# Patient Record
Sex: Male | Born: 1945 | Race: Black or African American | Hispanic: No | Marital: Single | Smoking: Current every day smoker
Health system: Southern US, Community
[De-identification: ages and names within clinical notes are randomized; demographics above are authoritative.]

## PROBLEM LIST (undated history)

## (undated) DIAGNOSIS — I10 Essential (primary) hypertension: Secondary | ICD-10-CM

## (undated) DIAGNOSIS — G4730 Sleep apnea, unspecified: Secondary | ICD-10-CM

## (undated) DIAGNOSIS — I2510 Atherosclerotic heart disease of native coronary artery without angina pectoris: Secondary | ICD-10-CM

## (undated) DIAGNOSIS — J189 Pneumonia, unspecified organism: Secondary | ICD-10-CM

## (undated) DIAGNOSIS — E039 Hypothyroidism, unspecified: Secondary | ICD-10-CM

## (undated) DIAGNOSIS — Z923 Personal history of irradiation: Secondary | ICD-10-CM

## (undated) DIAGNOSIS — E119 Type 2 diabetes mellitus without complications: Secondary | ICD-10-CM

## (undated) DIAGNOSIS — I255 Ischemic cardiomyopathy: Secondary | ICD-10-CM

## (undated) DIAGNOSIS — I509 Heart failure, unspecified: Secondary | ICD-10-CM

## (undated) HISTORY — PX: CORONARY ARTERY BYPASS GRAFT: SHX141

## (undated) HISTORY — DX: Type 2 diabetes mellitus without complications: E11.9

## (undated) HISTORY — PX: CARDIAC SURGERY: SHX584

## (undated) HISTORY — PX: COLONOSCOPY: SHX174

---

## 2016-03-25 ENCOUNTER — Encounter (HOSPITAL_COMMUNITY): Payer: Self-pay | Admitting: Emergency Medicine

## 2016-03-25 ENCOUNTER — Emergency Department (HOSPITAL_COMMUNITY)
Admission: EM | Admit: 2016-03-25 | Discharge: 2016-03-25 | Disposition: A | Discharge status: Home / Self Care | Payer: Self-pay | Attending: Emergency Medicine | Admitting: Emergency Medicine

## 2016-03-25 ENCOUNTER — Emergency Department (HOSPITAL_COMMUNITY): Payer: Self-pay

## 2016-03-25 DIAGNOSIS — Z79899 Other long term (current) drug therapy: Secondary | ICD-10-CM | POA: Insufficient documentation

## 2016-03-25 DIAGNOSIS — M79652 Pain in left thigh: Secondary | ICD-10-CM | POA: Insufficient documentation

## 2016-03-25 DIAGNOSIS — F17200 Nicotine dependence, unspecified, uncomplicated: Secondary | ICD-10-CM | POA: Insufficient documentation

## 2016-03-25 DIAGNOSIS — Z7984 Long term (current) use of oral hypoglycemic drugs: Secondary | ICD-10-CM | POA: Insufficient documentation

## 2016-03-25 DIAGNOSIS — R1030 Lower abdominal pain, unspecified: Secondary | ICD-10-CM | POA: Insufficient documentation

## 2016-03-25 DIAGNOSIS — K602 Anal fissure, unspecified: Secondary | ICD-10-CM | POA: Insufficient documentation

## 2016-03-25 DIAGNOSIS — M79651 Pain in right thigh: Secondary | ICD-10-CM | POA: Insufficient documentation

## 2016-03-25 DIAGNOSIS — Z7982 Long term (current) use of aspirin: Secondary | ICD-10-CM | POA: Insufficient documentation

## 2016-03-25 DIAGNOSIS — M545 Low back pain: Secondary | ICD-10-CM | POA: Insufficient documentation

## 2016-03-25 DIAGNOSIS — Z87828 Personal history of other (healed) physical injury and trauma: Secondary | ICD-10-CM | POA: Insufficient documentation

## 2016-03-25 LAB — CBC WITH DIFFERENTIAL/PLATELET
BASOS ABS: 0 10*3/uL (ref 0.0–0.1)
Basophils Relative: 0 %
EOS PCT: 2 %
Eosinophils Absolute: 0.2 10*3/uL (ref 0.0–0.7)
HEMATOCRIT: 42.9 % (ref 39.0–52.0)
HEMOGLOBIN: 15 g/dL (ref 13.0–17.0)
LYMPHS ABS: 2.9 10*3/uL (ref 0.7–4.0)
LYMPHS PCT: 29 %
MCH: 31.5 pg (ref 26.0–34.0)
MCHC: 35 g/dL (ref 30.0–36.0)
MCV: 90.1 fL (ref 78.0–100.0)
Monocytes Absolute: 0.7 10*3/uL (ref 0.1–1.0)
Monocytes Relative: 7 %
NEUTROS ABS: 6.1 10*3/uL (ref 1.7–7.7)
NEUTROS PCT: 62 %
PLATELETS: 250 10*3/uL (ref 150–400)
RBC: 4.76 MIL/uL (ref 4.22–5.81)
RDW: 14.3 % (ref 11.5–15.5)
WBC: 9.9 10*3/uL (ref 4.0–10.5)

## 2016-03-25 LAB — COMPREHENSIVE METABOLIC PANEL
ALK PHOS: 82 U/L (ref 38–126)
ALT: 18 U/L (ref 17–63)
AST: 27 U/L (ref 15–41)
Albumin: 4.2 g/dL (ref 3.5–5.0)
Anion gap: 8 (ref 5–15)
BUN: 8 mg/dL (ref 6–20)
CALCIUM: 9.5 mg/dL (ref 8.9–10.3)
CHLORIDE: 108 mmol/L (ref 101–111)
CO2: 25 mmol/L (ref 22–32)
CREATININE: 1.06 mg/dL (ref 0.61–1.24)
GFR calc Af Amer: >60 mL/min (ref >60)
Glucose, Bld: 118 mg/dL — ABNORMAL HIGH (ref 65–99)
Potassium: 4.1 mmol/L (ref 3.5–5.1)
Sodium: 141 mmol/L (ref 135–145)
Total Bilirubin: 0.7 mg/dL (ref 0.3–1.2)
Total Protein: 7 g/dL (ref 6.5–8.1)

## 2016-03-25 LAB — URINALYSIS, ROUTINE W REFLEX MICROSCOPIC
BILIRUBIN URINE: NEGATIVE
GLUCOSE, UA: NEGATIVE mg/dL
HGB URINE DIPSTICK: NEGATIVE
Ketones, ur: NEGATIVE mg/dL
Leukocytes, UA: NEGATIVE
Nitrite: NEGATIVE
Protein, ur: NEGATIVE mg/dL
Specific Gravity, Urine: 1.009 (ref 1.005–1.030)
pH: 6 (ref 5.0–8.0)

## 2016-03-25 LAB — SEDIMENTATION RATE: SED RATE: 13 mm/h (ref 0–16)

## 2016-03-25 MED ORDER — IBUPROFEN 800 MG PO TABS
800.0000 mg | ORAL_TABLET | Freq: Once | ORAL | Status: AC
  Administered 2016-03-25: 800 mg via ORAL
  Filled 2016-03-25: qty 1

## 2016-03-25 MED ORDER — ACETAMINOPHEN 500 MG PO TABS
1000.0000 mg | ORAL_TABLET | Freq: Once | ORAL | Status: AC
  Administered 2016-03-25: 1000 mg via ORAL
  Filled 2016-03-25: qty 2

## 2016-03-25 MED ORDER — POLYETHYLENE GLYCOL 3350 17 G PO PACK
17.0000 g | PACK | Freq: Every day | ORAL | Status: DC
  Administered 2016-03-25: 17 g via ORAL
  Filled 2016-03-25: qty 1

## 2016-03-25 MED ORDER — OXYCODONE HCL 5 MG PO TABS
2.5000 mg | ORAL_TABLET | Freq: Once | ORAL | Status: AC
  Administered 2016-03-25: 2.5 mg via ORAL
  Filled 2016-03-25: qty 1

## 2016-03-25 NOTE — Discharge Instructions (Signed)
Try MiraLAX 8 scoops in 32 ounces of drink of your choice. Stay near a bathroom. Try tylenol for your pain, follow up with your PCP.  Pain Without a Known Cause WHAT IS PAIN WITHOUT A KNOWN CAUSE? Pain can occur in any part of the body and can range from mild to severe. Sometimes no cause can be found for why you are having pain. Some types of pain that can occur without a known cause include:   Headache.  Back pain.  Abdominal pain.  Neck pain. HOW IS PAIN WITHOUT A KNOWN CAUSE DIAGNOSED?  Your health care provider will try to find the cause of your pain. This may include:  Physical exam.  Medical history.  Blood tests.  Urine tests.  X-rays. If no cause is found, your health care provider may diagnose you with pain without a known cause.  IS THERE TREATMENT FOR PAIN WITHOUT A CAUSE?  Treatment depends on the kind of pain you have. Your health care provider may prescribe medicines to help relieve your pain.  WHAT CAN I DO AT HOME FOR MY PAIN?   Take medicines only as directed by your health care provider.  Stop any activities that cause pain. During periods of severe pain, bed rest may help.  Try to reduce your stress with activities such as yoga or meditation. Talk to your health care provider for other stress-reducing activity recommendations.  Exercise regularly, if approved by your health care provider.  Eat a healthy diet that includes fruits and vegetables. This may improve pain. Talk to your health care provider if you have any questions about your diet. WHAT IF MY PAIN DOES NOT GET BETTER?  If you have a painful condition and no reason can be found for the pain or the pain gets worse, it is important to follow up with your health care provider. It may be necessary to repeat tests and look further for a possible cause.    This information is not intended to replace advice given to you by your health care provider. Make sure you discuss any questions you have with your  health care provider.   Document Released: 09/09/2001 Document Revised: 01/05/2015 Document Reviewed: 05/02/2014 Elsevier Interactive Patient Education [employer].

## 2016-03-25 NOTE — ED Notes (Signed)
Pt states "i got constipated for about a month, when i went to the [place] and they gave me medicine for pulled muscle" Pt "i take a stool softener and i had a bowel moment yesterday". Pt c/o pain in my rectum and down his legs. Pt ambulatory with a cane.

## 2016-03-25 NOTE — ED Provider Notes (Signed)
CSN: [id number]     Arrival date & time 03/25/16  1123 History   First MD Initiated Contact with Patient 03/25/16 1837     Chief Complaint  Patient presents with  . Constipation     (Consider location/radiation/quality/duration/timing/severity/associated sxs/prior Treatment) Patient is a 70 y.o. male presenting with constipation.  Constipation Severity:  Moderate Time since last bowel movement:  5 weeks Timing:  Constant Progression:  Worsening Chronicity:  New Stool description:  Small Relieved by:  Nothing Worsened by:  Nothing tried Ineffective treatments:  None tried Associated symptoms: no abdominal pain, no diarrhea, no fever and no vomiting    70 yo M with a chief complaints of bilateral thigh pain. This been going on for at least a month and half. Patient states that he was really constipated had very large bowel movement and then started having the pain. This pain is usually worse in the morning. Improves throughout the day. He has seen his family doctor for this and was told that he had pulled a muscle. He did fall about 2 weeks ago, he said that he had driven about 18 hours straight and then got out of the car in both of his legs were asleep and fell to the ground. The pain is described as burning. It's at the attachment of his hamstring to his pubic symphysis bilaterally  History reviewed. No pertinent past medical history. History reviewed. No pertinent past surgical history. No family history on file. Social History  Substance Use Topics  . Smoking status: Current Some Day Smoker  . Smokeless tobacco: None  . Alcohol Use: No    Review of Systems  Constitutional: Negative for fever and chills.  HENT: Negative for congestion and facial swelling.   Eyes: Negative for discharge and visual disturbance.  Respiratory: Negative for shortness of breath.   Cardiovascular: Negative for chest pain and palpitations.  Gastrointestinal: Positive for constipation. Negative for  vomiting, abdominal pain and diarrhea.  Musculoskeletal: Negative for myalgias and arthralgias.  Skin: Negative for color change and rash.  Neurological: Negative for tremors, syncope and headaches.  Psychiatric/Behavioral: Negative for confusion and dysphoric mood.      Allergies  Review of patient's allergies indicates no known allergies.  Home Medications   Prior to Admission medications   Medication Sig Start Date End Date Taking? Authorizing Provider  aspirin 81 MG tablet Take 81 mg by mouth daily.   Yes Historical Provider, MD  atenolol (TENORMIN) 50 MG tablet Take 50 mg by mouth daily.   Yes Historical Provider, MD  atorvastatin (LIPITOR) 40 MG tablet Take 40 mg by mouth daily. Take one half of a tablet by mouth each night.   Yes Historical Provider, MD  docusate sodium (COLACE) 100 MG capsule Take 100 mg by mouth 2 (two) times daily.   Yes Historical Provider, MD  glipiZIDE (GLUCOTROL) 10 MG tablet Take 10 mg by mouth daily before breakfast.   Yes Historical Provider, MD  metFORMIN (GLUCOPHAGE) 1000 MG tablet Take 1,000 mg by mouth 2 (two) times daily with a meal.   Yes Historical Provider, MD  tamsulosin (FLOMAX) 0.4 MG CAPS capsule Take 0.4 mg by mouth.   Yes Historical Provider, MD  traZODone (DESYREL) 50 MG tablet Take 50 mg by mouth at bedtime.   Yes Historical Provider, MD   BP 145/80 mmHg  Pulse 80  Temp(Src) 98.3 F (36.8 C) (Oral)  Resp 20  Ht 5' 8"  (1.727 m)  Wt 242 lb (109.77 kg)  BMI 36.80  kg/m2  SpO2 100% Physical Exam  Constitutional: He is oriented to person, place, and time. He appears well-developed and well-nourished.  HENT:  Head: Normocephalic and atraumatic.  Eyes: EOM are normal. Pupils are equal, round, and reactive to light.  Neck: Normal range of motion. Neck supple. No JVD present.  Cardiovascular: Normal rate and regular rhythm.  Exam reveals no gallop and no friction rub.   No murmur heard. Pulmonary/Chest: No respiratory distress. He has  no wheezes.  Abdominal: He exhibits no distension. There is no tenderness. There is no rebound and no guarding. Hernia confirmed negative in the right inguinal area and confirmed negative in the left inguinal area.  Genitourinary: Testes normal. Rectal exam shows fissure (appears to be old, non tender). Guaiac negative stool. Prostate is not enlarged and not tender. Right testis shows no mass, no swelling and no tenderness. Left testis shows no mass, no swelling and no tenderness. Circumcised.  No stool in the vault  Musculoskeletal: Normal range of motion.  Lymphadenopathy:       Right: No inguinal adenopathy present.       Left: No inguinal adenopathy present.  Neurological: He is alert and oriented to person, place, and time.  Skin: No rash noted. No pallor.  Psychiatric: He has a normal mood and affect. His behavior is normal.  Nursing note and vitals reviewed.   ED Course  Procedures (including critical care time) Labs Review Labs Reviewed  COMPREHENSIVE METABOLIC PANEL - Abnormal; Notable for the following:    Glucose, Bld 118 (*)    All other components within normal limits  URINALYSIS, ROUTINE W REFLEX MICROSCOPIC (NOT AT [facility])  SEDIMENTATION RATE  CBC WITH DIFFERENTIAL/PLATELET    Imaging Review Dg Lumbar Spine Complete  03/25/2016  CLINICAL DATA:  70 year old male with low back pain. EXAM: LUMBAR SPINE - COMPLETE 4+ VIEW COMPARISON:  None. FINDINGS: There is no acute fracture or subluxation. There are multilevel degenerative changes with anterior osteophyte formation. The vertebral body heights and disc spaces are maintained. The visualized transverse and spinous processes appear intact. There is moderate stool throughout the colon. Soft tissues appear unremarkable. IMPRESSION: No acute fracture or subluxation. Electronically Signed   By: Anner Crete M.D.   On: 03/25/2016 20:08   I have personally reviewed and evaluated these images and lab results as part of my medical  decision-making.   EKG Interpretation None      MDM   Final diagnoses:  Groin pain, unspecified laterality    70 yo M with a chief complaint of vague bilateral thigh pain. This been going on for at least a month and half. Occurred after a bowel movement. Patient has no stool in the vault. Complaining of some midline lower back pain. No prostate tenderness.  Patient's ESR is normal field of polymyalgia rheumatica is unlikely at this time. Patient's symptoms were improved with a bowel movement while in the ED. Feel like his symptoms may be secondary to constipation. Patient will trial a cleanout at home. Follow-up with his family doctor.  9:49 PM:  I have discussed the diagnosis/risks/treatment options with the patient and believe the pt to be eligible for discharge home to follow-up with PCP. We also discussed returning to the ED immediately if new or worsening sx occur. We discussed the sx which are most concerning (e.g., sudden worsening pain, fever, cauda equina) that necessitate immediate return. Medications administered to the patient during their visit and any new prescriptions provided to the patient are listed below.  Medications given during this visit Medications  polyethylene glycol (MIRALAX / GLYCOLAX) packet 17 g (not administered)  acetaminophen (TYLENOL) tablet 1,000 mg (1,000 mg Oral Given 03/25/16 1941)  ibuprofen (ADVIL,MOTRIN) tablet 800 mg (800 mg Oral Given 03/25/16 1941)  oxyCODONE (Oxy IR/ROXICODONE) immediate release tablet 2.5 mg (2.5 mg Oral Given 03/25/16 1941)    New Prescriptions   No medications on file    The patient appears reasonably screen and/or stabilized for discharge and I doubt any other medical condition or other [facility] requiring further screening, evaluation, or treatment in the ED at this time prior to discharge.    Deno Etienne, DO 03/25/16 2149

## 2016-09-10 ENCOUNTER — Ambulatory Visit (INDEPENDENT_AMBULATORY_CARE_PROVIDER_SITE_OTHER): Payer: Non-veteran care | Admitting: Family Medicine

## 2016-09-10 ENCOUNTER — Ambulatory Visit (INDEPENDENT_AMBULATORY_CARE_PROVIDER_SITE_OTHER): Payer: Non-veteran care

## 2016-09-10 DIAGNOSIS — M5416 Radiculopathy, lumbar region: Secondary | ICD-10-CM

## 2016-09-10 DIAGNOSIS — M5410 Radiculopathy, site unspecified: Secondary | ICD-10-CM | POA: Diagnosis not present

## 2016-09-10 DIAGNOSIS — E119 Type 2 diabetes mellitus without complications: Secondary | ICD-10-CM | POA: Diagnosis not present

## 2016-09-10 DIAGNOSIS — G8929 Other chronic pain: Secondary | ICD-10-CM | POA: Diagnosis not present

## 2016-09-10 DIAGNOSIS — Z23 Encounter for immunization: Secondary | ICD-10-CM

## 2016-09-10 MED ORDER — TRAMADOL HCL 50 MG PO TABS: 50.0000 mg | ORAL_TABLET | Freq: Three times a day (TID) | ORAL | 0 refills | Status: DC | PRN

## 2016-09-10 MED ORDER — MELOXICAM 15 MG PO TABS: 15.0000 mg | ORAL_TABLET | Freq: Every day | ORAL | 0 refills | Status: DC

## 2016-09-10 NOTE — Patient Instructions (Addendum)
IF you received an x-ray today, you will receive an invoice from [facility]. Please contact [facility] at [phone] with questions or concerns regarding your invoice.   IF you received labwork today, you will receive an invoice from [employer]. Please contact [facility] at [phone] with questions or concerns regarding your invoice.   Our billing staff will not be able to assist you with questions regarding bills from these companies.  You will be contacted with the lab results as soon as they are available. The fastest way to get your results is to activate your My Chart account. Instructions are located on the last page of this paperwork. If you have not heard from [place] regarding the results in 2 weeks, please contact this office.      Sciatica With Rehab The sciatic nerve runs from the back down the leg and is responsible for sensation and control of the muscles in the back (posterior) side of the thigh, lower leg, and foot. Sciatica is a condition that is characterized by inflammation of this nerve.  SYMPTOMS   Signs of nerve damage, including numbness and/or weakness along the posterior side of the lower extremity.  Pain in the back of the thigh that may also travel down the leg.  Pain that worsens when sitting for long periods of time.  Occasionally, pain in the back or buttock. CAUSES  Inflammation of the sciatic nerve is the cause of sciatica. The inflammation is due to something irritating the nerve. Common sources of irritation include:  Sitting for long periods of time.  Direct trauma to the nerve.  Arthritis of the spine.  Herniated or ruptured disk.  Slipping of the vertebrae (spondylolisthesis).  Pressure from soft tissues, such as muscles or ligament-like tissue (fascia). RISK INCREASES WITH:  Sports that place pressure or stress on the spine (football or weightlifting).  Poor strength and  flexibility.  Failure to warm up properly before activity.  Family history of low back pain or disk disorders.  Previous back injury or surgery.  Poor body mechanics, especially when lifting, or poor posture. PREVENTION   Warm up and stretch properly before activity.  Maintain physical fitness:  Strength, flexibility, and endurance.  Cardiovascular fitness.  Learn and use proper technique, especially with posture and lifting. When possible, have coach correct improper technique.  Avoid activities that place stress on the spine. PROGNOSIS If treated properly, then sciatica usually resolves within 6 weeks. However, occasionally surgery is necessary.  RELATED COMPLICATIONS   Permanent nerve damage, including pain, numbness, tingle, or weakness.  Chronic back pain.  Risks of surgery: infection, bleeding, nerve damage, or damage to surrounding tissues. TREATMENT Treatment initially involves resting from any activities that aggravate your symptoms. The use of ice and medication may help reduce pain and inflammation. The use of strengthening and stretching exercises may help reduce pain with activity. These exercises may be performed at home or with referral to a therapist. A therapist may recommend further treatments, such as transcutaneous electronic nerve stimulation (TENS) or ultrasound. Your caregiver may recommend corticosteroid injections to help reduce inflammation of the sciatic nerve. If symptoms persist despite non-surgical (conservative) treatment, then surgery may be recommended. MEDICATION  If pain medication is necessary, then nonsteroidal anti-inflammatory medications, such as aspirin and ibuprofen, or other minor pain relievers, such as acetaminophen, are often recommended.  Do not take pain medication for 7 days before surgery.  Prescription pain relievers may be given if deemed necessary by your  caregiver. Use only as directed and only as much as you  need.  Ointments applied to the skin may be helpful.  Corticosteroid injections may be given by your caregiver. These injections should be reserved for the most serious cases, because they may only be given a certain number of times. HEAT AND COLD  Cold treatment (icing) relieves pain and reduces inflammation. Cold treatment should be applied for 10 to 15 minutes every 2 to 3 hours for inflammation and pain and immediately after any activity that aggravates your symptoms. Use ice packs or massage the area with a piece of ice (ice massage).  Heat treatment may be used prior to performing the stretching and strengthening activities prescribed by your caregiver, physical therapist, or athletic trainer. Use a heat pack or soak the injury in warm water. SEEK MEDICAL CARE IF:  Treatment seems to offer no benefit, or the condition worsens.  Any medications produce adverse side effects. EXERCISES  RANGE OF MOTION (ROM) AND STRETCHING EXERCISES - Sciatica Most people with sciatic will find that their symptoms worsen with either excessive bending forward (flexion) or arching at the low back (extension). The exercises which will help resolve your symptoms will focus on the opposite motion. Your physician, physical therapist or athletic trainer will help you determine which exercises will be most helpful to resolve your low back pain. Do not complete any exercises without first consulting with your clinician. Discontinue any exercises which worsen your symptoms until you speak to your clinician. If you have pain, numbness or tingling which travels down into your buttocks, leg or foot, the goal of the therapy is for these symptoms to move closer to your back and eventually resolve. Occasionally, these leg symptoms will get better, but your low back pain may worsen; this is typically an indication of progress in your rehabilitation. Be certain to be very alert to any changes in your symptoms and the activities in  which you participated in the 24 hours prior to the change. Sharing this information with your clinician will allow him/her to most efficiently treat your condition. These exercises may help you when beginning to rehabilitate your injury. Your symptoms may resolve with or without further involvement from your physician, physical therapist or athletic trainer. While completing these exercises, remember:   Restoring tissue flexibility helps normal motion to return to the joints. This allows healthier, less painful movement and activity.  An effective stretch should be held for at least 30 seconds.  A stretch should never be painful. You should only feel a gentle lengthening or release in the stretched tissue. FLEXION RANGE OF MOTION AND STRETCHING EXERCISES: STRETCH - Flexion, Single Knee to Chest   Lie on a firm bed or floor with both legs extended in front of you.  Keeping one leg in contact with the floor, bring your opposite knee to your chest. Hold your leg in place by either grabbing behind your thigh or at your knee.  Pull until you feel a gentle stretch in your low back. Hold __________ seconds.  Slowly release your grasp and repeat the exercise with the opposite side. Repeat __________ times. Complete this exercise __________ times per day.  STRETCH - Flexion, Double Knee to Chest  Lie on a firm bed or floor with both legs extended in front of you.  Keeping one leg in contact with the floor, bring your opposite knee to your chest.  Tense your stomach muscles to support your back and then lift your other knee to  your chest. Hold your legs in place by either grabbing behind your thighs or at your knees.  Pull both knees toward your chest until you feel a gentle stretch in your low back. Hold __________ seconds.  Tense your stomach muscles and slowly return one leg at a time to the floor. Repeat __________ times. Complete this exercise __________ times per day.  STRETCH - Low Trunk  Rotation   Lie on a firm bed or floor. Keeping your legs in front of you, bend your knees so they are both pointed toward the ceiling and your feet are flat on the floor.  Extend your arms out to the side. This will stabilize your upper body by keeping your shoulders in contact with the floor.  Gently and slowly drop both knees together to one side until you feel a gentle stretch in your low back. Hold for __________ seconds.  Tense your stomach muscles to support your low back as you bring your knees back to the starting position. Repeat the exercise to the other side. Repeat __________ times. Complete this exercise __________ times per day  EXTENSION RANGE OF MOTION AND FLEXIBILITY EXERCISES: STRETCH - Extension, Prone on Elbows  Lie on your stomach on the floor, a bed will be too soft. Place your palms about shoulder width apart and at the height of your head.  Place your elbows under your shoulders. If this is too painful, stack pillows under your chest.  Allow your body to relax so that your hips drop lower and make contact more completely with the floor.  Hold this position for __________ seconds.  Slowly return to lying flat on the floor. Repeat __________ times. Complete this exercise __________ times per day.  RANGE OF MOTION - Extension, Prone Press Ups  Lie on your stomach on the floor, a bed will be too soft. Place your palms about shoulder width apart and at the height of your head.  Keeping your back as relaxed as possible, slowly straighten your elbows while keeping your hips on the floor. You may adjust the placement of your hands to maximize your comfort. As you gain motion, your hands will come more underneath your shoulders.  Hold this position __________ seconds.  Slowly return to lying flat on the floor. Repeat __________ times. Complete this exercise __________ times per day.  STRENGTHENING EXERCISES - Sciatica  These exercises may help you when beginning to  rehabilitate your injury. These exercises should be done near your "sweet spot." This is the neutral, low-back arch, somewhere between fully rounded and fully arched, that is your least painful position. When performed in this safe range of motion, these exercises can be used for people who have either a flexion or extension based injury. These exercises may resolve your symptoms with or without further involvement from your physician, physical therapist or athletic trainer. While completing these exercises, remember:   Muscles can gain both the endurance and the strength needed for everyday activities through controlled exercises.  Complete these exercises as instructed by your physician, physical therapist or athletic trainer. Progress with the resistance and repetition exercises only as your caregiver advises.  You may experience muscle soreness or fatigue, but the pain or discomfort you are trying to eliminate should never worsen during these exercises. If this pain does worsen, stop and make certain you are following the directions exactly. If the pain is still present after adjustments, discontinue the exercise until you can discuss the trouble with your clinician. STRENGTHENING - Deep  Abdominals, Pelvic Tilt   Lie on a firm bed or floor. Keeping your legs in front of you, bend your knees so they are both pointed toward the ceiling and your feet are flat on the floor.  Tense your lower abdominal muscles to press your low back into the floor. This motion will rotate your pelvis so that your tail bone is scooping upwards rather than pointing at your feet or into the floor.  With a gentle tension and even breathing, hold this position for __________ seconds. Repeat __________ times. Complete this exercise __________ times per day.  STRENGTHENING - Abdominals, Crunches   Lie on a firm bed or floor. Keeping your legs in front of you, bend your knees so they are both pointed toward the ceiling and  your feet are flat on the floor. Cross your arms over your chest.  Slightly tip your chin down without bending your neck.  Tense your abdominals and slowly lift your trunk high enough to just clear your shoulder blades. Lifting higher can put excessive stress on the low back and does not further strengthen your abdominal muscles.  Control your return to the starting position. Repeat __________ times. Complete this exercise __________ times per day.  STRENGTHENING - Quadruped, Opposite UE/LE Lift  Assume a hands and knees position on a firm surface. Keep your hands under your shoulders and your knees under your hips. You may place padding under your knees for comfort.  Find your neutral spine and gently tense your abdominal muscles so that you can maintain this position. Your shoulders and hips should form a rectangle that is parallel with the floor and is not twisted.  Keeping your trunk steady, lift your right hand no higher than your shoulder and then your left leg no higher than your hip. Make sure you are not holding your breath. Hold this position __________ seconds.  Continuing to keep your abdominal muscles tense and your back steady, slowly return to your starting position. Repeat with the opposite arm and leg. Repeat __________ times. Complete this exercise __________ times per day.  STRENGTHENING - Abdominals and Quadriceps, Straight Leg Raise   Lie on a firm bed or floor with both legs extended in front of you.  Keeping one leg in contact with the floor, bend the other knee so that your foot can rest flat on the floor.  Find your neutral spine, and tense your abdominal muscles to maintain your spinal position throughout the exercise.  Slowly lift your straight leg off the floor about 6 inches for a count of 15, making sure to not hold your breath.  Still keeping your neutral spine, slowly lower your leg all the way to the floor. Repeat this exercise with each leg __________  times. Complete this exercise __________ times per day. POSTURE AND BODY MECHANICS CONSIDERATIONS - Sciatica Keeping correct posture when sitting, standing or completing your activities will reduce the stress put on different body tissues, allowing injured tissues a chance to heal and limiting painful experiences. The following are general guidelines for improved posture. Your physician or physical therapist will provide you with any instructions specific to your needs. While reading these guidelines, remember:  The exercises prescribed by your provider will help you have the flexibility and strength to maintain correct postures.  The correct posture provides the optimal environment for your joints to work. All of your joints have less wear and tear when properly supported by a spine with good posture. This means you will experience  a healthier, less painful body.  Correct posture must be practiced with all of your activities, especially prolonged sitting and standing. Correct posture is as important when doing repetitive low-stress activities (typing) as it is when doing a single heavy-load activity (lifting). RESTING POSITIONS Consider which positions are most painful for you when choosing a resting position. If you have pain with flexion-based activities (sitting, bending, stooping, squatting), choose a position that allows you to rest in a less flexed posture. You would want to avoid curling into a fetal position on your side. If your pain worsens with extension-based activities (prolonged standing, working overhead), avoid resting in an extended position such as sleeping on your stomach. Most people will find more comfort when they rest with their spine in a more neutral position, neither too rounded nor too arched. Lying on a non-sagging bed on your side with a pillow between your knees, or on your back with a pillow under your knees will often provide some relief. Keep in mind, being in any one  position for a prolonged period of time, no matter how correct your posture, can still lead to stiffness. PROPER SITTING POSTURE In order to minimize stress and discomfort on your spine, you must sit with correct posture Sitting with good posture should be effortless for a healthy body. Returning to good posture is a gradual process. Many people can work toward this most comfortably by using various supports until they have the flexibility and strength to maintain this posture on their own. When sitting with proper posture, your ears will fall over your shoulders and your shoulders will fall over your hips. You should use the back of the chair to support your upper back. Your low back will be in a neutral position, just slightly arched. You may place a small pillow or folded towel at the base of your low back for support.  When working at a desk, create an environment that supports good, upright posture. Without extra support, muscles fatigue and lead to excessive strain on joints and other tissues. Keep these recommendations in mind: CHAIR:   A chair should be able to slide under your desk when your back makes contact with the back of the chair. This allows you to work closely.  The chair's height should allow your eyes to be level with the upper part of your monitor and your hands to be slightly lower than your elbows. BODY POSITION  Your feet should make contact with the floor. If this is not possible, use a foot rest.  Keep your ears over your shoulders. This will reduce stress on your neck and low back. INCORRECT SITTING POSTURES   If you are feeling tired and unable to assume a healthy sitting posture, do not slouch or slump. This puts excessive strain on your back tissues, causing more damage and pain. Healthier options include:  Using more support, like a lumbar pillow.  Switching tasks to something that requires you to be upright or walking.  Talking a brief walk.  Lying down to  rest in a neutral-spine position. PROLONGED STANDING WHILE SLIGHTLY LEANING FORWARD  When completing a task that requires you to lean forward while standing in one place for a long time, place either foot up on a stationary 2-4 inch high object to help maintain the best posture. When both feet are on the ground, the low back tends to lose its slight inward curve. If this curve flattens (or becomes too large), then the back and your other  joints will experience too much stress, fatigue more quickly and can cause pain.  CORRECT STANDING POSTURES Proper standing posture should be assumed with all daily activities, even if they only take a few moments, like when brushing your teeth. As in sitting, your ears should fall over your shoulders and your shoulders should fall over your hips. You should keep a slight tension in your abdominal muscles to brace your spine. Your tailbone should point down to the ground, not behind your body, resulting in an over-extended swayback posture.  INCORRECT STANDING POSTURES  Common incorrect standing postures include a forward head, locked knees and/or an excessive swayback. WALKING Walk with an upright posture. Your ears, shoulders and hips should all line-up. PROLONGED ACTIVITY IN A FLEXED POSITION When completing a task that requires you to bend forward at your waist or lean over a low surface, try to find a way to stabilize 3 of 4 of your limbs. You can place a hand or elbow on your thigh or rest a knee on the surface you are reaching across. This will provide you more stability so that your muscles do not fatigue as quickly. By keeping your knees relaxed, or slightly bent, you will also reduce stress across your low back. CORRECT LIFTING TECHNIQUES DO :   Assume a wide stance. This will provide you more stability and the opportunity to get as close as possible to the object which you are lifting.  Tense your abdominals to brace your spine; then bend at the knees and  hips. Keeping your back locked in a neutral-spine position, lift using your leg muscles. Lift with your legs, keeping your back straight.  Test the weight of unknown objects before attempting to lift them.  Try to keep your elbows locked down at your sides in order get the best strength from your shoulders when carrying an object.  Always ask for help when lifting heavy or awkward objects. INCORRECT LIFTING TECHNIQUES DO NOT:   Lock your knees when lifting, even if it is a small object.  Bend and twist. Pivot at your feet or move your feet when needing to change directions.  Assume that you cannot safely pick up a paperclip without proper posture.   This information is not intended to replace advice given to you by your health care provider. Make sure you discuss any questions you have with your health care provider.   Document Released: 12/15/2005 Document Revised: 05/01/2015 Document Reviewed: 03/29/2009 [employer] Interactive Patient Education [employer].

## 2016-09-10 NOTE — Progress Notes (Signed)
By signing my name below, I, Paul [name], attest that this documentation has been prepared under the direction and in the presence of Paul Forts, MD.  Electronically Signed: Verlee [name], Medical Scribe. 09/10/2016. 12:10 PM.  Subjective:    Patient ID: Paul [name], male    DOB: 12/25/1946, 70 y.o.   MRN: [id number]  09/10/2016  Medication Refill   HPI  HPI Comments: Paul [name] is a 70 y.o. male who presents to the [facility] for medication refill. This is a new pt to me.   Pt reports lower back pain, and hip pain that radiates down both legs onset 1 month. Pt reports associated symptoms of numbness in his leg when he wakes up. Pt states he fell down a flight of stairs and fell on his back a long time ago when his sciatic nerve was messed up. Pt was given ibuprofen 800 mg and tramadol at that time and has been taking them for relief to his symptoms. Pain occurs with walking, and bending over. Pt does not have a chiropractor. Pt denies urine complications, diarrhea, and constipation. Pt deferred blood draw.  Pt is not allergic to anything. Pt denies major complications. Pt previously weighed 300 lbs and was diabetic, since he lost weight his DM has been controlled and he no longer takes Metformin. Pt is a veteran and he occasionally goes to the [place] for his problems. Pt's last physical was years ago. Pt last colonoscopy when he was in his 40s.  Social: Pt just moved to [place] a month ago from [facility] and he doesn't know any physicians yet. Pt never had a PNA shot. Pt is single and he has grown children. Pt worked at a company in [employer] is a occasional smoker, and social drinker. Pt drives.    Review of Systems  Constitutional: Negative for activity change, appetite change, chills, diaphoresis, fatigue and fever.  Respiratory: Negative for cough and shortness of breath.   Cardiovascular: Negative for chest pain, palpitations and leg swelling.    Gastrointestinal: Negative for abdominal pain, diarrhea, nausea and vomiting.  Endocrine: Negative for cold intolerance, heat intolerance, polydipsia, polyphagia and polyuria.  Musculoskeletal: Positive for back pain, gait problem and myalgias.  Skin: Negative for color change, rash and wound.  Neurological: Negative for dizziness, tremors, seizures, syncope, facial asymmetry, speech difficulty, weakness, light-headedness, numbness and headaches.  Psychiatric/Behavioral: Negative for dysphoric mood and sleep disturbance. The patient is not nervous/anxious.     History reviewed. No pertinent past medical history. History reviewed. No pertinent surgical history. No Known Allergies Current Outpatient Prescriptions  Medication Sig Dispense Refill  . aspirin 81 MG tablet Take 81 mg by mouth daily.    . meloxicam (MOBIC) 7.5 MG tablet Take 1 tablet (7.5 mg total) by mouth daily. 30 tablet 1  . traMADol (ULTRAM) 50 MG tablet Take 1 tablet (50 mg total) by mouth 2 (two) times daily. 30 tablet 0   No current facility-administered medications for this visit.    Social History   Social History  . Marital status: Single    Spouse name: N/A  . Number of children: N/A  . Years of education: N/A   Occupational History  . Not on file.   Social History Main Topics  . Smoking status: Current Some Day Smoker  . Smokeless tobacco: Current User  . Alcohol use No  . Drug use: Unknown  . Sexual activity: Not on file   Other Topics Concern  .  Not on file   Social History Narrative  . No narrative on file   History reviewed. No pertinent family history.     Objective:    There were no vitals taken for this visit. Physical Exam  Constitutional: He is oriented to person, place, and time. He appears well-developed and well-nourished. No distress.  HENT:  Head: Normocephalic and atraumatic.  Eyes: Conjunctivae and EOM are normal. Pupils are equal, round, and reactive to light.  Neck:  Normal range of motion. Neck supple. Carotid bruit is not present. No thyromegaly present.  Cardiovascular: Normal rate, regular rhythm, normal heart sounds and intact distal pulses.  Exam reveals no gallop and no friction rub.   No murmur heard. Pulmonary/Chest: Effort normal and breath sounds normal. He has no wheezes. He has no rales.  Musculoskeletal: He exhibits no edema (lower extremity).       Lumbar back: He exhibits decreased range of motion, tenderness and pain. He exhibits no bony tenderness, no swelling, no edema, no deformity, no laceration, no spasm and normal pulse.  Pain with flexion of lumbar spine Unstable marching due to proximal muscle instability  Lymphadenopathy:    He has no cervical adenopathy.  Neurological: He is alert and oriented to person, place, and time. No cranial nerve deficit. He exhibits normal muscle tone. Coordination normal.  Nl heel to toe test but not without pain  Skin: Skin is warm and dry. No rash noted. He is not diaphoretic.  Psychiatric: He has a normal mood and affect. His behavior is normal.  Nursing note and vitals reviewed.   No results found. Assessment & Plan:   1. Chronic radicular pain of lower back   2. Type 2 diabetes mellitus without complication, without long-term current use of insulin ([place])   3. Need for prophylactic vaccination and inoculation against influenza    -chronic lower back pain with intermittent exacerbations.  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 -rx for Tramadol and Meloxicam provided. -home exercise program provided to perform daily. -pt refused labs.  Re[facility]  [facility]   Orders Placed This Encounter  Procedures  . DG Lumbar Spine Complete    Standing Status:   Future    Number of Occurrences:   1    Standing Expiration Date:   09/10/2017    Order Specific Question:   Reason for Exam (SYMPTOM  OR DIAGNOSIS REQUIRED)     Answer:   B lower back pain with radiation into legs for atleast six months with recent worsening with fall one month ago down flight of stairs    Order Specific Question:   Preferred imaging location?    Answer:   External  . Flu Vaccine QUAD 36+ mos IM  . Ambulatory referral to Orthopedic Surgery    Referral Priority:   Routine    Referral Type:   Surgical    Referral Reason:   Specialty Services Required    Requested Specialty:   Orthopedic Surgery    Number of Visits Requested:   1   Meds ordered this encounter  Medications  . DISCONTD: traMADol (ULTRAM) 50 MG tablet    Sig: Take 1 tablet (50 mg total) by mouth every 8 (eight) hours as needed.    Dispense:  30 tablet    Refill:  0  . DISCONTD: meloxicam (MOBIC) 15 MG tablet    Sig: Take 1 tablet (15 mg total) by mouth daily.    Dispense:  30 tablet    Refill:  0    No Follow-up on file.   I personally performed the services described in this documentation, which was scribed in my presence. The recorded information has been reviewed and considered.   Nimra Puccinelli Elayne Guerin, M.D. [facility] [address] [place], [place]  [address] [phone] phone [phone] fax

## 2016-09-17 ENCOUNTER — Ambulatory Visit (HOSPITAL_COMMUNITY)
Admission: EM | Admit: 2016-09-17 | Discharge: 2016-09-17 | Disposition: A | Discharge status: Home / Self Care | Payer: Medicare Other | Attending: Family Medicine | Admitting: Family Medicine

## 2016-09-17 ENCOUNTER — Encounter (HOSPITAL_COMMUNITY): Payer: Self-pay | Admitting: *Deleted

## 2016-09-17 DIAGNOSIS — S39012A Strain of muscle, fascia and tendon of lower back, initial encounter: Secondary | ICD-10-CM

## 2016-09-17 MED ORDER — MELOXICAM 7.5 MG PO TABS: 7.5000 mg | ORAL_TABLET | Freq: Two times a day (BID) | ORAL | 1 refills | Status: DC

## 2016-09-17 MED ORDER — TRAMADOL HCL 50 MG PO TABS: 50.0000 mg | ORAL_TABLET | Freq: Two times a day (BID) | ORAL | 0 refills | Status: DC

## 2016-09-17 NOTE — ED Provider Notes (Signed)
[facility]    CSN: [id number] Arrival date & time: 09/17/16  1040  First Provider Contact:  First MD Initiated Contact with Patient 09/17/16 1214        History   Chief Complaint Chief Complaint  Patient presents with  . Back Pain    HPI Paul [name] is a 70 y.o. male.   The history is provided by the patient.  Back Pain  Location:  Lumbar spine Quality:  Stiffness Stiffness is present:  In the morning Radiates to:  L posterior upper leg and R posterior upper leg Pain severity:  Moderate Onset quality:  Sudden Duration:  3 months Progression:  Unchanged Chronicity:  New (fell down [facility] in [facility] and seen there for eval, no orthopedic eval.) Context: falling   Associated symptoms: no abdominal pain, no abdominal swelling, no bladder incontinence, no bowel incontinence, no chest pain, no fever, no numbness, no paresthesias, no tingling and no weakness     History reviewed. No pertinent past medical history.  There are no active problems to display for this patient.   History reviewed. No pertinent surgical history.  OB History    No data available       Home Medications    Prior to Admission medications   Medication Sig Start Date End Date Taking? Authorizing Provider  aspirin 81 MG tablet Take 81 mg by mouth daily.    Historical Provider, MD  meloxicam (MOBIC) 15 MG tablet Take 1 tablet (15 mg total) by mouth daily. 09/10/16   Wardell Honour, MD  traMADol (ULTRAM) 50 MG tablet Take 1 tablet (50 mg total) by mouth every 8 (eight) hours as needed. 09/10/16   Wardell Honour, MD    Family History History reviewed. No pertinent family history.  Social History Social History  Substance Use Topics  . Smoking status: Current Some Day Smoker  . Smokeless tobacco: Current User  . Alcohol use No     Allergies   Review of patient's allergies indicates no known allergies.   Review of Systems Review of Systems  Constitutional: Negative.   Negative for fever.  Cardiovascular: Negative for chest pain.  Gastrointestinal: Negative.  Negative for abdominal pain and bowel incontinence.  Genitourinary: Negative.  Negative for bladder incontinence.  Musculoskeletal: Positive for back pain. Negative for gait problem.  Skin: Negative.   Neurological: Negative for tingling, weakness, numbness and paresthesias.  All other systems reviewed and are negative.    Physical Exam Triage Vital Signs ED Triage Vitals  Enc Vitals Group     BP 09/17/16 1206 176/96     Pulse Rate 09/17/16 1206 72     Resp 09/17/16 1206 16     Temp 09/17/16 1206 98.6 F (37 C)     Temp Source 09/17/16 1206 Oral     SpO2 --      Weight --      Height --      Head Circumference --      Peak Flow --      Pain Score 09/17/16 1204 10     Pain Loc --      Pain Edu? --      Excl. in [place]? --    No data found.   Updated Vital Signs BP 176/96 (BP Location: Left Arm)   Pulse 72   Temp 98.6 F (37 C) (Oral)   Resp 16   Visual Acuity Right Eye Distance:   Left Eye Distance:   Bilateral Distance:  Right Eye Near:   Left Eye Near:    Bilateral Near:     Physical Exam  Constitutional: He is oriented to person, place, and time. He appears well-developed and well-nourished.  Abdominal: Soft. Bowel sounds are normal.  Musculoskeletal:       Lumbar back: He exhibits tenderness, pain and spasm.       Back:  Neurological: He is alert and oriented to person, place, and time.  Skin: Skin is warm and dry.  Nursing note and vitals reviewed.    UC Treatments / Results  Labs (all labs ordered are listed, but only abnormal results are displayed) Labs Reviewed - No data to display  EKG  EKG Interpretation None       Radiology No results found.  Procedures Procedures (including critical care time)  Medications Ordered in UC Medications - No data to display   Initial Impression / Assessment and Plan / UC Course  I have reviewed the  triage vital signs and the nursing notes.  Pertinent labs & imaging results that were available during my care of the patient were reviewed by me and considered in my medical decision making (see chart for details).  Clinical Course      Final Clinical Impressions(s) / UC Diagnoses   Final diagnoses:  None    New Prescriptions New Prescriptions   No medications on file     Billy Fischer, MD 09/17/16 1231

## 2016-09-17 NOTE — ED Triage Notes (Signed)
Pt  Sustained  An  Injury  About      6  Months  Ago  In [place]   He  States          He  Reports  Low  Back pain       He   denys  Any recent  Injury

## 2016-10-17 ENCOUNTER — Encounter: Payer: Self-pay | Admitting: Family Medicine

## 2016-10-17 ENCOUNTER — Ambulatory Visit (INDEPENDENT_AMBULATORY_CARE_PROVIDER_SITE_OTHER): Payer: Non-veteran care | Admitting: Physician Assistant

## 2016-10-17 VITALS — BP 122/72 | HR 88 | Temp 97.4°F | Resp 17 | Ht 68.0 in | Wt 226.0 lb

## 2016-10-17 DIAGNOSIS — S39012D Strain of muscle, fascia and tendon of lower back, subsequent encounter: Secondary | ICD-10-CM

## 2016-10-17 DIAGNOSIS — M47816 Spondylosis without myelopathy or radiculopathy, lumbar region: Secondary | ICD-10-CM

## 2016-10-17 MED ORDER — TRAMADOL HCL 50 MG PO TABS: 50.0000 mg | ORAL_TABLET | Freq: Two times a day (BID) | ORAL | 0 refills | Status: DC

## 2016-10-17 MED ORDER — MELOXICAM 7.5 MG PO TABS: 7.5000 mg | ORAL_TABLET | Freq: Every day | ORAL | 1 refills | Status: DC

## 2016-10-17 NOTE — Progress Notes (Signed)
   Paul [name]  MRN: [id number] DOB: 10/16/1946  Subjective:  Pt presents to clinic with continued lumbar back pain that radiates into both leg evenly.  The pain is the same since after the injury.  He has been using the pain medication which helped and he would like more.  He has not heard about a referral as of today.  The pain started after over the summer he fell and landed on his back on some steps.  He states the pain occurs every day and is in the center of his lower back and is an aching pain.  His legs also ache - there is no sharp stabbing pain.  He was seen 9/13 here and then a week later at [facility] and both times was given Ultram and mobic for his pain.    Review of Systems  Musculoskeletal: Positive for back pain. Negative for gait problem.    There are no active problems to display for this patient.   Current Outpatient Prescriptions on File Prior to Visit  Medication Sig Dispense Refill  . aspirin 81 MG tablet Take 81 mg by mouth daily.     No current facility-administered medications on file prior to visit.     No Known Allergies  Pt patients past, family and social history were reviewed and updated.  Objective:  BP 122/72 (BP Location: Right Arm, Patient Position: Sitting, Cuff Size: Normal)   Pulse 88   Temp 97.4 F (36.3 C) (Oral)   Resp 17   Ht '5\' 8"'$  (1.727 m)   Wt 226 lb (102.5 kg)   SpO2 97%   BMI 34.36 kg/m   Physical Exam  Constitutional: He is oriented to person, place, and time and well-developed, well-nourished, and in no distress.  HENT:  Head: Normocephalic and atraumatic.  Right Ear: External ear normal.  Left Ear: External ear normal.  Eyes: Conjunctivae are normal.  Neck: Normal range of motion.  Pulmonary/Chest: Effort normal.  Musculoskeletal:       Right shoulder: He exhibits decreased range of motion (2nd to pain) and tenderness (lumbar spine - no TTP over paraspinal muscles). He exhibits no pain and no spasm.       Lumbar back: He  exhibits decreased range of motion, tenderness, pain and spasm.  Neurological: He is alert and oriented to person, place, and time. He has normal strength and normal reflexes. He displays no weakness. A sensory deficit (slight decreased sensation in the left side but pt states the feeling is only slightly dulled) is present. He has a normal Straight Leg Raise Test. Gait normal. Gait normal.  Skin: Skin is warm and dry.  Psychiatric: Mood, memory, affect and judgment normal.    Assessment and Plan :  Strain of lumbar region, subsequent encounter - Plan: meloxicam (MOBIC) 7.5 MG tablet, traMADol (ULTRAM) 50 MG tablet, Ambulatory referral to Orthopedic Surgery  Lumbar spondylosis - Plan: meloxicam (MOBIC) 7.5 MG tablet, traMADol (ULTRAM) 50 MG tablet, Ambulatory referral to Orthopedic Surgery   Pt needs a back specialist for further care and determination is PT would help his current back pain.  Windell Hummingbird PA-C  [facility] 10/17/2016 10:40 AM

## 2016-10-17 NOTE — Patient Instructions (Addendum)
We have done a referral to orthopedic specialist - they will determine if physcial therapy will be a good option for you    IF you received an x-ray today, you will receive an invoice from [facility]. Please contact [facility] at [phone] with questions or concerns regarding your invoice.   IF you received labwork today, you will receive an invoice from [employer]. Please contact [facility] at [phone] with questions or concerns regarding your invoice.   Our billing staff will not be able to assist you with questions regarding bills from these companies.  You will be contacted with the lab results as soon as they are available. The fastest way to get your results is to activate your My Chart account. Instructions are located on the last page of this paperwork. If you have not heard from [place] regarding the results in 2 weeks, please contact this office.

## 2018-05-26 ENCOUNTER — Encounter: Payer: Self-pay | Admitting: Family Medicine

## 2018-09-03 DIAGNOSIS — N183 Chronic kidney disease, stage 3 unspecified: Secondary | ICD-10-CM | POA: Diagnosis present

## 2018-09-03 DIAGNOSIS — I2510 Atherosclerotic heart disease of native coronary artery without angina pectoris: Secondary | ICD-10-CM | POA: Diagnosis present

## 2018-09-03 DIAGNOSIS — N1831 Chronic kidney disease, stage 3a: Secondary | ICD-10-CM | POA: Diagnosis present

## 2018-10-15 ENCOUNTER — Telehealth (HOSPITAL_COMMUNITY): Payer: Self-pay

## 2018-10-15 NOTE — Telephone Encounter (Signed)
Received outside/paper referral from [place] from [facility] Card[facility]. Called and spoke with patient in regards to Cardiac Rehab. Pt stated he is interested in the program and will be billing through the [place]. Explained to pt that he will need to contact the [place] to fax over Authorization for him to participate in our program. Pt verbalized understanding. Pt is currently in Rehab at [facility]. He stated he will be d/c on Sunday and has been there for a week. Pt informed me that he is going to the [place] on Monday to talk with them about Cardiac Rehab. Pt does not have date scheduled for f/u with Surgeon Dr.Jacks. Informed pt he will need to complete follow up appt. He stated [facility] not [facility] with [facility] yet.  Will await Authorization from [facility]. If no fax received, will f/u with pt in a week.  Pts paperwork is in the [place] folder.

## 2018-12-01 ENCOUNTER — Telehealth (HOSPITAL_COMMUNITY): Payer: Self-pay | Admitting: *Deleted

## 2018-12-01 NOTE — Telephone Encounter (Signed)
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  Received [facility] authorization [id number] 11/11/18 to 05/10/2019. Reviewed submitted follow up with [place] cardiologist - Fulp and 10/31 with cardiac surgeon. Pt is appropriate to contact for scheduling. Will request 12 lead ekg tracing from [facility]. Will forward to support staff. Cherre Huger, BSN Cardiac and Training and development officer

## 2018-12-06 ENCOUNTER — Telehealth (HOSPITAL_COMMUNITY): Payer: Self-pay

## 2018-12-06 NOTE — Telephone Encounter (Signed)
Pt called in regards to CR, patient will come in for orientation on 01/20/2019 @ 815AM and will attend the 115PM exercise class.  Mailed homework package.

## 2018-12-06 NOTE — Telephone Encounter (Signed)
Pt is covered thru [place]

## 2019-01-13 ENCOUNTER — Emergency Department (HOSPITAL_COMMUNITY)
Admission: EM | Admit: 2019-01-13 | Discharge: 2019-01-13 | Disposition: A | Discharge status: Home / Self Care | Payer: Self-pay | Attending: Emergency Medicine | Admitting: Emergency Medicine

## 2019-01-13 ENCOUNTER — Emergency Department (HOSPITAL_COMMUNITY): Payer: Self-pay

## 2019-01-13 ENCOUNTER — Encounter (HOSPITAL_COMMUNITY): Payer: Self-pay

## 2019-01-13 ENCOUNTER — Other Ambulatory Visit: Payer: Self-pay

## 2019-01-13 DIAGNOSIS — R2243 Localized swelling, mass and lump, lower limb, bilateral: Secondary | ICD-10-CM | POA: Insufficient documentation

## 2019-01-13 DIAGNOSIS — Z79899 Other long term (current) drug therapy: Secondary | ICD-10-CM | POA: Insufficient documentation

## 2019-01-13 DIAGNOSIS — R5383 Other fatigue: Secondary | ICD-10-CM | POA: Insufficient documentation

## 2019-01-13 DIAGNOSIS — J181 Lobar pneumonia, unspecified organism: Secondary | ICD-10-CM | POA: Insufficient documentation

## 2019-01-13 DIAGNOSIS — J189 Pneumonia, unspecified organism: Secondary | ICD-10-CM

## 2019-01-13 DIAGNOSIS — Z7982 Long term (current) use of aspirin: Secondary | ICD-10-CM | POA: Insufficient documentation

## 2019-01-13 DIAGNOSIS — M7989 Other specified soft tissue disorders: Secondary | ICD-10-CM

## 2019-01-13 DIAGNOSIS — E119 Type 2 diabetes mellitus without complications: Secondary | ICD-10-CM | POA: Insufficient documentation

## 2019-01-13 DIAGNOSIS — F17210 Nicotine dependence, cigarettes, uncomplicated: Secondary | ICD-10-CM | POA: Insufficient documentation

## 2019-01-13 LAB — COMPREHENSIVE METABOLIC PANEL WITH GFR
ALT: 18 U/L (ref 0–44)
AST: 26 U/L (ref 15–41)
Albumin: 3.6 g/dL (ref 3.5–5.0)
Alkaline Phosphatase: 110 U/L (ref 38–126)
Anion gap: 9 (ref 5–15)
BUN: 19 mg/dL (ref 8–23)
CO2: 26 mmol/L (ref 22–32)
Calcium: 8.9 mg/dL (ref 8.9–10.3)
Chloride: 105 mmol/L (ref 98–111)
Creatinine, Ser: 1.57 mg/dL — ABNORMAL HIGH (ref 0.61–1.24)
GFR calc Af Amer: 50 mL/min — ABNORMAL LOW
GFR calc non Af Amer: 43 mL/min — ABNORMAL LOW
Glucose, Bld: 121 mg/dL — ABNORMAL HIGH (ref 70–99)
Potassium: 3.8 mmol/L (ref 3.5–5.1)
Sodium: 140 mmol/L (ref 135–145)
Total Bilirubin: 1.2 mg/dL (ref 0.3–1.2)
Total Protein: 7.1 g/dL (ref 6.5–8.1)

## 2019-01-13 LAB — CBC WITH DIFFERENTIAL/PLATELET
ABS IMMATURE GRANULOCYTES: 0.03 10*3/uL (ref 0.00–0.07)
BASOS ABS: 0.1 10*3/uL (ref 0.0–0.1)
BASOS PCT: 1 %
EOS PCT: 3 %
Eosinophils Absolute: 0.3 10*3/uL (ref 0.0–0.5)
HCT: 41.2 % (ref 39.0–52.0)
HEMOGLOBIN: 12.4 g/dL — AB (ref 13.0–17.0)
Immature Granulocytes: 0 %
LYMPHS ABS: 1.8 10*3/uL (ref 0.7–4.0)
LYMPHS PCT: 22 %
MCH: 29.7 pg (ref 26.0–34.0)
MCHC: 30.1 g/dL (ref 30.0–36.0)
MCV: 98.6 fL (ref 80.0–100.0)
MONO ABS: 1 10*3/uL (ref 0.1–1.0)
Monocytes Relative: 11 %
NEUTROS ABS: 5.4 10*3/uL (ref 1.7–7.7)
Neutrophils Relative %: 63 %
Platelets: 162 10*3/uL (ref 150–400)
RBC: 4.18 MIL/uL — AB (ref 4.22–5.81)
RDW: 17.2 % — ABNORMAL HIGH (ref 11.5–15.5)
WBC: 8.6 10*3/uL (ref 4.0–10.5)
nRBC: 0 % (ref 0.0–0.2)

## 2019-01-13 LAB — I-STAT TROPONIN, ED: Troponin i, poc: 0.03 ng/mL (ref 0.00–0.08)

## 2019-01-13 LAB — TSH: TSH: 2.297 u[IU]/mL (ref 0.350–4.500)

## 2019-01-13 LAB — BRAIN NATRIURETIC PEPTIDE: B Natriuretic Peptide: 871.7 pg/mL — ABNORMAL HIGH (ref 0.0–100.0)

## 2019-01-13 MED ORDER — SODIUM CHLORIDE (PF) 0.9 % IJ SOLN
INTRAMUSCULAR | Status: AC
  Filled 2019-01-13: qty 50

## 2019-01-13 MED ORDER — LEVOFLOXACIN 750 MG PO TABS: 750.0000 mg | ORAL_TABLET | Freq: Every day | ORAL | 0 refills | Status: AC

## 2019-01-13 MED ORDER — ALBUTEROL SULFATE (2.5 MG/3ML) 0.083% IN NEBU
5.0000 mg | INHALATION_SOLUTION | Freq: Once | RESPIRATORY_TRACT | Status: AC
  Administered 2019-01-13: 5 mg via RESPIRATORY_TRACT
  Filled 2019-01-13: qty 6

## 2019-01-13 MED ORDER — LEVOFLOXACIN 750 MG PO TABS
750.0000 mg | ORAL_TABLET | Freq: Every day | ORAL | Status: DC
  Administered 2019-01-13: 750 mg via ORAL
  Filled 2019-01-13: qty 1

## 2019-01-13 MED ORDER — IOPAMIDOL (ISOVUE-370) INJECTION 76%
100.0000 mL | Freq: Once | INTRAVENOUS | Status: AC | PRN
  Administered 2019-01-13: 80 mL via INTRAVENOUS

## 2019-01-13 MED ORDER — IOPAMIDOL (ISOVUE-370) INJECTION 76%
INTRAVENOUS | Status: AC
  Filled 2019-01-13: qty 100

## 2019-01-13 NOTE — ED Triage Notes (Signed)
Patient c/o SOB and weakness x 2 weeks and progressively getting worse.

## 2019-01-13 NOTE — ED Notes (Signed)
ED Provider at bedside. 

## 2019-01-13 NOTE — Discharge Instructions (Signed)
Follow-up with your clinic at the [place].  Important follow-up for this pneumonia to make sure things clear will probably need a follow-up CT scan in a few weeks.  Appears that there was a right lower lobe pneumonia.  However there was some concerns that it may represent tuberculosis.  But the radiologist favored bacterial pneumonia.  Also recommend taking your fluid pill again at least in the morning.  To help get some of the fluid off your legs.  Return for any new or worse symptoms.  Would expect some improvement over the next couple days.

## 2019-01-13 NOTE — ED Provider Notes (Signed)
[place] DEPT Provider Note   CSN: [id number] Arrival date & time: 01/13/19  7588     History   Chief Complaint Chief Complaint  Patient presents with  . Shortness of Breath  . Weakness    HPI Paul [name] is a 73 y.o. male.  Patient with complaint of shortness of breath actually since November.  But is been worse in the last 2 weeks.  Status post open heart surgery in October at [facility] had triple bypass.  Presented here with oxygen sats 99% on room air.  Does have bilateral lower extremity swelling supposed to be on a fluid pill which he has stopped.  But he states he still has that.  Patient's had some fatigue with this as well.  Feels as if things are getting worse.  Blood pressure slightly elevated.  Clinically some concern of whether there may be congestive heart failure.  Due to the leg swelling.  Initial heart rate at 100.  Patient did not remain tachycardic.     Past Medical History:  Diagnosis Date  . Diabetes mellitus without complication ([facility])     There are no active problems to display for this patient.   Past Surgical History:  Procedure Laterality Date  . CARDIAC SURGERY          Home Medications    Prior to Admission medications   Medication Sig Start Date End Date Taking? Authorizing Provider  aspirin 81 MG tablet Take 81 mg by mouth daily.   Yes [provider]  levofloxacin (LEVAQUIN) 750 MG tablet Take 1 tablet (750 mg total) by mouth daily for 7 days. 01/13/19 01/20/19  Fredia Sorrow, MD  meloxicam (MOBIC) 7.5 MG tablet Take 1 tablet (7.5 mg total) by mouth daily. 10/17/16   Weber, Damaris Hippo, PA-C  traMADol (ULTRAM) 50 MG tablet Take 1 tablet (50 mg total) by mouth 2 (two) times daily. 10/17/16   Mancel Bale, PA-C    Family History History reviewed. No pertinent family history.  Social History Social History   Tobacco Use  . Smoking status: Current Some Day Smoker    Types: Cigarettes  .  Smokeless tobacco: Never Used  Substance Use Topics  . Alcohol use: No  . Drug use: Never     Allergies   Patient has no known allergies.   Review of Systems Review of Systems  Constitutional: Positive for fatigue. Negative for chills and fever.  HENT: Negative for congestion, rhinorrhea and sore throat.   Eyes: Negative for visual disturbance.  Respiratory: Positive for shortness of breath. Negative for cough.   Cardiovascular: Positive for leg swelling. Negative for chest pain.  Gastrointestinal: Negative for abdominal pain, diarrhea, nausea and vomiting.  Genitourinary: Negative for dysuria.  Musculoskeletal: Negative for back pain and neck pain.  Skin: Negative for rash.  Neurological: Negative for dizziness, light-headedness and headaches.  Hematological: Does not bruise/bleed easily.  Psychiatric/Behavioral: Negative for confusion.     Physical Exam Updated Vital Signs BP (!) 168/127   Pulse 96   Temp 97.9 F (36.6 C) (Oral)   Resp 18   Ht 1.727 m (5\' 8" )   Wt 99.8 kg   SpO2 100%   BMI 33.45 kg/m   Physical Exam Vitals signs and nursing note reviewed.  Constitutional:      General: He is not in acute distress.    Appearance: Normal appearance. He is well-developed.  HENT:     Head: Normocephalic and atraumatic.  Mouth/Throat:     Mouth: Mucous membranes are moist.  Eyes:     Conjunctiva/sclera: Conjunctivae normal.  Neck:     Musculoskeletal: Neck supple.  Cardiovascular:     Rate and Rhythm: Normal rate and regular rhythm.     Heart sounds: No murmur.  Pulmonary:     Effort: Pulmonary effort is normal. No respiratory distress.     Breath sounds: Normal breath sounds. No wheezing or rales.  Abdominal:     Palpations: Abdomen is soft.     Tenderness: There is no abdominal tenderness.  Musculoskeletal:     Right lower leg: Edema present.     Left lower leg: Edema present.  Skin:    General: Skin is warm and dry.  Neurological:     General:  No focal deficit present.     Mental Status: He is alert and oriented to person, place, and time.      ED Treatments / Results  Labs (all labs ordered are listed, but only abnormal results are displayed) Labs Reviewed  CBC WITH DIFFERENTIAL/PLATELET - Abnormal; Notable for the following components:      Result Value   RBC 4.18 (*)    Hemoglobin 12.4 (*)    RDW 17.2 (*)    All other components within normal limits  COMPREHENSIVE METABOLIC PANEL - Abnormal; Notable for the following components:   Glucose, Bld 121 (*)    Creatinine, Ser 1.57 (*)    GFR calc non Af Amer 43 (*)    GFR calc Af Amer 50 (*)    All other components within normal limits  BRAIN NATRIURETIC PEPTIDE - Abnormal; Notable for the following components:   B Natriuretic Peptide 871.7 (*)    All other components within normal limits  TSH  I-STAT TROPONIN, ED    EKG EKG Interpretation  Date/Time:  Thursday January 13 2019 09:37:49 EST Ventricular Rate:  100 PR Interval:    QRS Duration: 116 QT Interval:  384 QTC Calculation: 496 R Axis:   68 Text Interpretation:  Sinus tachycardia Probable left atrial enlargement Nonspecific intraventricular conduction delay Inferior infarct, old Lateral leads are also involved No previous ECGs available Confirmed by Fredia Sorrow [phone]) on 01/13/2019 10:59:42 AM   Radiology Dg Chest 2 View  Result Date: 01/13/2019 CLINICAL DATA:  Shortness of breath and weakness for 2 weeks EXAM: CHEST - 2 VIEW COMPARISON:  None. FINDINGS: Postsurgical changes are noted. Cardiac enlargement is seen. Mild bibasilar atelectasis is noted without focal confluent infiltrate. No effusion is seen. No bony abnormality is noted. IMPRESSION: Mild bibasilar atelectasis. Electronically Signed   By: Inez Catalina M.D.   On: 01/13/2019 10:15   Ct Angio Chest Pe W/cm &/or Wo Cm  Result Date: 01/13/2019 CLINICAL DATA:  Shortness of breath and weakness for 2 weeks progressively getting worse, high  pretest probability of pulmonary embolism, history diabetes mellitus, smoking EXAM: CT ANGIOGRAPHY CHEST WITH CONTRAST TECHNIQUE: Multidetector CT imaging of the chest was performed using the standard protocol during bolus administration of intravenous contrast. Multiplanar CT image reconstructions and MIPs were obtained to evaluate the vascular anatomy. CONTRAST:  48mL ISOVUE-370 IOPAMIDOL (ISOVUE-370) INJECTION 76% IV COMPARISON:  None FINDINGS: Cardiovascular: Scattered atherosclerotic calcifications aorta and coronary arteries. Aorta normal caliber. Minimal enlargement of cardiac chambers. No pericardial effusion. Pulmonary arteries well opacified and patent. No evidence of pulmonary embolism. Mediastinum/Nodes: Base of cervical region normal appearance. Esophagus unremarkable. Enlarged subcarinal lymph node 17 mm short axis image 41. Enlarged LEFT paratracheal node  at carina 16 mm short axis image 30. Few additional normal sized mediastinal lymph nodes. Lungs/Pleura: Small RIGHT pleural effusion. Focal opacity RIGHT lower lobe approximately 3.1 x 2.0 x 2.3 cm in size favor consolidation though requiring follow-up until resolution to exclude mass/neoplasm. Small air bronchogram versus linear focus of cavitation is seen within this opacity. Minimal hazy infiltrates in the upper lobes and lower lobes also identified. No pneumothorax or additional mass/nodule. Upper Abdomen: Unremarkable Musculoskeletal: No acute osseous findings. Review of the MIP images confirms the above findings. IMPRESSION: No evidence of pulmonary embolism. Small RIGHT pleural effusion. Scattered infiltrates in both lungs with a more focal area of opacity in the RIGHT lower lobe 3.1 x 2.0 x 2.3 cm in size containing a small central linear gas collection which could either represent air bronchogram or cavitation. Favor this representing pneumonia though this requires short-term CT follow-up in 4-6 weeks following treatment to ensure resolution  and exclude pulmonary neoplasm. Aortic Atherosclerosis (ICD10-I70.0). Electronically Signed   By: Lavonia Dana M.D.   On: 01/13/2019 14:30    Procedures Procedures (including critical care time)  Medications Ordered in ED Medications  sodium chloride (PF) 0.9 % injection (has no administration in time range)  iopamidol (ISOVUE-370) 76 % injection (has no administration in time range)  levofloxacin (LEVAQUIN) tablet 750 mg (750 mg Oral Given 01/13/19 1606)  albuterol (PROVENTIL) (2.5 MG/3ML) 0.083% nebulizer solution 5 mg (5 mg Nebulization Given 01/13/19 1016)  iopamidol (ISOVUE-370) 76 % injection 100 mL (80 mLs Intravenous Contrast Given 01/13/19 1403)     Initial Impression / Assessment and Plan / ED Course  I have reviewed the triage vital signs and the nursing notes.  Pertinent labs & imaging results that were available during my care of the patient were reviewed by me and considered in my medical decision making (see chart for details).     Patient presenting with shortness of breath and weakness for 2 weeks.  Feels as if he is getting worse.  Also has had some bilateral leg swelling.  He is also had some of the shortness of breath since November.  But worse recently.  He had open heart surgery done triple bypass in October done at [facility].  He is followed by the [place] in [place].  Upon presentation his room air sats were 99%.  Did have bilateral leg swelling.  Patient not tachycardic no acute distress.  Chest x-ray without any definitive findings.  Patient had CT angios done to rule out a blood clot.  This showed evidence of a right lower lobe pneumonia.  No evidence of pulmonary embolus.  May be some question of a cavitation but radiology favored that is probably bacterial pneumonia.  Will [facility]  Will need follow-up CT scan to make sure this all resolves.  Also recommend patient resume taking his fluid pill.  Which she had stopped.  Patient  will return for any new or worse symptoms.  Patient's thyroid-stimulating hormone was fine troponin was normal.  EKG without any significant abnormalities white count was normal.  Patient electrolytes without sniffing abnormalities other than a slight elevation in the creatinine.  BNP was elevated at 871 which was part of the reason why the CT Angie of chest done but that did not seem to show significant congestive heart failure or pulmonary edema.  There was a little bit of pleural effusion.    Final Clinical Impressions(s) / ED Diagnoses   Final diagnoses:  Community  acquired pneumonia of right lower lobe of lung ([place])  Leg swelling    ED Discharge Orders         Ordered    levofloxacin (LEVAQUIN) 750 MG tablet  Daily     01/13/19 1625           Fredia Sorrow, MD 01/13/19 [id number]

## 2019-01-17 NOTE — Progress Notes (Signed)
Paul [name] 73 y.o. male DOB 04-12-1946 MRN [id number]       Nutrition  No diagnosis found. Past Medical History:  Diagnosis Date  . Diabetes mellitus without complication ([place])    Meds reviewed.       Current Outpatient Medications (Analgesics):  .  aspirin 81 MG tablet, Take 81 mg by mouth daily. .  meloxicam (MOBIC) 7.5 MG tablet, Take 1 tablet (7.5 mg total) by mouth daily. .  traMADol (ULTRAM) 50 MG tablet, Take 1 tablet (50 mg total) by mouth 2 (two) times daily.   Current Outpatient Medications (Other):  .  levofloxacin (LEVAQUIN) 750 MG tablet, Take 1 tablet (750 mg total) by mouth daily for 7 days.   HT: Ht Readings from Last 1 Encounters:  01/13/19 5\' 8"  (1.727 m)    WT: Wt Readings from Last 5 Encounters:  01/13/19 220 lb (99.8 kg)  10/17/16 226 lb (102.5 kg)  03/25/16 242 lb (109.8 kg)         BMI= 33.45  01/13/19  Labs:  Lipid Panel  No results found for: CHOL, TRIG, HDL, CHOLHDL, VLDL, LDLCALC, LDLDIRECT  No results found for: HGBA1C CBG (last 3)  No results for input(s): GLUCAP in the last 72 hours.  Nutrition Diagnosis ? Food-and nutrition-related knowledge deficit related to lack of exposure to information as related to diagnosis of: ? CVD ? Type 2 Diabetes  Nutrition Goal(s):  ? To be determined  Plan:  Pt to attend nutrition classes ? Nutrition I ? Nutrition II ? Portion Distortion  ? Diabetes Blitz ? Diabetes Q & A Will provide client-centered nutrition education as part of interdisciplinary care.   Monitor and evaluate progress toward nutrition goal with team.  Laurina Bustle, MS, RD, LDN 01/17/2019 9:32 PM

## 2019-01-18 ENCOUNTER — Telehealth (HOSPITAL_COMMUNITY): Payer: Self-pay | Admitting: *Deleted

## 2019-01-19 ENCOUNTER — Telehealth (HOSPITAL_COMMUNITY): Payer: Self-pay | Admitting: *Deleted

## 2019-01-19 NOTE — Telephone Encounter (Signed)
Patient with noted elevation in his Blood pressure and ER visit here at [facility] on 1/16 for Pneumonia/Heart Failure. [place] cardiology clinic and spoke with Dr. Chapman Fitch nurse, Luetta Nutting.  Relayed to her concerns for pt to move forward with cardiac rehab with his elevation in bp and medication noncompliance.  Pt with recent  bp readings in the ER 168/127. Amber indicated that the pt was seen in the clinic on 1/17 and his bp was 160/109.  Pt with the addition of medications for bp and diuretic. Asked when pt follow up appt for bp check. Stated his next appt will be in June however the note indicated 3 months. Expressed to amber that perhaps he could be seen sooner.  Amber to talk with Dr. Chapman Fitch. I called and spoke to the pt who reports feeling better after he started the new medications.  Pt felt his breathing was back to normal and he no longer had dependent edema.  Asked if pt had a way to monitor his bp at home.  Pt indicated that he did have a cuff.  Asked what readings he had.  Mr. Alpert indicated that he had not taken his bp since his [place] clinic visit.  Advised pt to check his blood pressure twice a day and record the readings.  Pt further advised of the dangers of non compliance to medication and possibility of stroke. Instructed pt to monitor his salt intake, tobacco cessation (pt has the patch and continues to smoke) and light exercise.  Will follow up with pt in one week for review of his home blood pressures. Received call back from [employer] who had also called the pt and reviewed his medications.  Pt was not taking some of his medications but was taking the new ones.  Pt will return to the [place] clinic on 2/16 for bp check.  Will follow up with Amber after visit has been completed. Community Care coordinator advised of the cancellation for his appt.  Cherre Huger, BSN Cardiac and Training and development officer

## 2019-01-20 ENCOUNTER — Inpatient Hospital Stay (HOSPITAL_COMMUNITY)
Admission: RE | Admit: 2019-01-20 | Discharge: 2019-01-20 | Disposition: A | Discharge status: Home / Self Care | Payer: Non-veteran care | Source: Ambulatory Visit

## 2019-01-26 ENCOUNTER — Ambulatory Visit (HOSPITAL_COMMUNITY): Payer: Non-veteran care

## 2019-01-27 ENCOUNTER — Telehealth (HOSPITAL_COMMUNITY): Payer: Self-pay | Admitting: *Deleted

## 2019-01-27 NOTE — Telephone Encounter (Signed)
Called to check on Paul [name] blood pressure.  Pt was instructed to monitor his blood pressure now that he is taking his medications as prescribed.  Pt reports bp 125/88.  Advised pt that the systolic number had improved but the diastolic bp remains elevated.  Pt to have BP check in the [facility] cardiology clinic on 2/6.  Talked with pt about restricting sodium and tobacco cessation.  Pt stated that he was "eating better" and had "cut back" on the smoking.  Will follow up after appt on 2/6 at the [place]. Cherre Huger, BSN Cardiac and Training and development officer

## 2019-01-28 ENCOUNTER — Ambulatory Visit (HOSPITAL_COMMUNITY): Payer: Non-veteran care

## 2019-01-31 ENCOUNTER — Ambulatory Visit (HOSPITAL_COMMUNITY): Payer: Non-veteran care

## 2019-02-02 ENCOUNTER — Ambulatory Visit (HOSPITAL_COMMUNITY): Payer: Non-veteran care

## 2019-02-04 ENCOUNTER — Ambulatory Visit (HOSPITAL_COMMUNITY): Payer: Non-veteran care

## 2019-02-07 ENCOUNTER — Ambulatory Visit (HOSPITAL_COMMUNITY): Payer: Non-veteran care

## 2019-02-09 ENCOUNTER — Ambulatory Visit (HOSPITAL_COMMUNITY): Payer: Non-veteran care

## 2019-02-11 ENCOUNTER — Ambulatory Visit (HOSPITAL_COMMUNITY): Payer: Non-veteran care

## 2019-02-14 ENCOUNTER — Ambulatory Visit (HOSPITAL_COMMUNITY): Payer: Non-veteran care

## 2019-02-16 ENCOUNTER — Ambulatory Visit (HOSPITAL_COMMUNITY): Payer: Non-veteran care

## 2019-02-18 ENCOUNTER — Ambulatory Visit (HOSPITAL_COMMUNITY): Payer: Non-veteran care

## 2019-02-18 ENCOUNTER — Telehealth (HOSPITAL_COMMUNITY): Payer: Self-pay | Admitting: *Deleted

## 2019-02-18 NOTE — Telephone Encounter (Signed)
Attempted to contact pt. Unable to leave message - voice mail not set up.  Five minutes later pt called back.  Spoke with pt about his BP. Pt stated that had been to the [place] clinic for a bp check.  Pt was told it was good.  Asked pt what the BP was he stated 125/96 but he was not sure if that was right.  Pt has bp cuff at home but does not use it.  Pt admits he is being compliant with his medications, cut back on smoking and salt.  Called and spoke to [place] community care at [place].  Transferred to speak to the nurse - Tabitha.  Left message for Tabitha requesting copy of clinic note to verify his bp in order to reschedule his CR orientation.  Await return call. Cherre Huger, BSN Cardiac and Training and development officer

## 2019-02-21 ENCOUNTER — Ambulatory Visit (HOSPITAL_COMMUNITY): Payer: Non-veteran care

## 2019-02-22 ENCOUNTER — Telehealth (HOSPITAL_COMMUNITY): Payer: Self-pay | Admitting: *Deleted

## 2019-02-22 NOTE — Telephone Encounter (Signed)
Received fax from [place].  Fax included clinic visit from 01/14/2019.and BP check on 2/6/202.  BP was 142/92 [facility] was notified. " BP improved from last cardiology visit". BP remain not a goal for pt with the expectation of higher bp with exercise.  Called and left message forTabatha Forensic psychologist at [employer] care/ [place].  Asked what is the plan for this pt.  Contact information provided.  Await return call [place], BSN Cardiac and Pulmonary Rehab Nurse Navigator

## 2019-02-23 ENCOUNTER — Ambulatory Visit (HOSPITAL_COMMUNITY): Payer: Non-veteran care

## 2019-02-25 ENCOUNTER — Ambulatory Visit (HOSPITAL_COMMUNITY): Payer: Non-veteran care

## 2019-02-28 ENCOUNTER — Ambulatory Visit (HOSPITAL_COMMUNITY): Payer: Non-veteran care

## 2019-03-02 ENCOUNTER — Ambulatory Visit (HOSPITAL_COMMUNITY): Payer: Non-veteran care

## 2019-03-03 ENCOUNTER — Telehealth (HOSPITAL_COMMUNITY): Payer: Self-pay | Admitting: *Deleted

## 2019-03-03 NOTE — Telephone Encounter (Signed)
Received message from [place] at the [facility] regarding pt BP.  Per message cardiologist is okay for pt bp at rest to be 142/92.  Called and left message for Tabethia to please have pt cardiologist provide acceptable parameters for pt bp at rest and on exertion in writing.  Contact information including fax # provided. Cherre Huger, BSN Cardiac and Training and development officer

## 2019-03-04 ENCOUNTER — Ambulatory Visit (HOSPITAL_COMMUNITY): Payer: Non-veteran care

## 2019-03-07 ENCOUNTER — Ambulatory Visit (HOSPITAL_COMMUNITY): Payer: Non-veteran care

## 2019-03-09 ENCOUNTER — Ambulatory Visit (HOSPITAL_COMMUNITY): Payer: Non-veteran care

## 2019-03-11 ENCOUNTER — Ambulatory Visit (HOSPITAL_COMMUNITY): Payer: Non-veteran care

## 2019-03-14 ENCOUNTER — Ambulatory Visit (HOSPITAL_COMMUNITY): Payer: Non-veteran care

## 2019-03-16 ENCOUNTER — Ambulatory Visit (HOSPITAL_COMMUNITY): Payer: Non-veteran care

## 2019-03-18 ENCOUNTER — Ambulatory Visit (HOSPITAL_COMMUNITY): Payer: Non-veteran care

## 2019-03-21 ENCOUNTER — Ambulatory Visit (HOSPITAL_COMMUNITY): Payer: Non-veteran care

## 2019-03-23 ENCOUNTER — Ambulatory Visit (HOSPITAL_COMMUNITY): Payer: Non-veteran care

## 2019-03-25 ENCOUNTER — Ambulatory Visit (HOSPITAL_COMMUNITY): Payer: Non-veteran care

## 2019-03-28 ENCOUNTER — Ambulatory Visit (HOSPITAL_COMMUNITY): Payer: Non-veteran care

## 2019-03-30 ENCOUNTER — Ambulatory Visit (HOSPITAL_COMMUNITY): Payer: Non-veteran care

## 2019-04-01 ENCOUNTER — Ambulatory Visit (HOSPITAL_COMMUNITY): Payer: Non-veteran care

## 2019-04-04 ENCOUNTER — Ambulatory Visit (HOSPITAL_COMMUNITY): Payer: Non-veteran care

## 2019-04-06 ENCOUNTER — Ambulatory Visit (HOSPITAL_COMMUNITY): Payer: Non-veteran care

## 2019-04-08 ENCOUNTER — Ambulatory Visit (HOSPITAL_COMMUNITY): Payer: Non-veteran care

## 2019-04-11 ENCOUNTER — Ambulatory Visit (HOSPITAL_COMMUNITY): Payer: Non-veteran care

## 2019-04-12 ENCOUNTER — Telehealth (HOSPITAL_COMMUNITY): Payer: Self-pay | Admitting: *Deleted

## 2019-04-12 NOTE — Telephone Encounter (Signed)
Received fax requesting information regarding this veteran first appt with cardiac rehab. Called and spoke to Keith Rake with community care [place].  Reported to him that the pt had not completed his appt with cardiac rehab on January 20, 2019 due to recent hospitalization for pneumonia and elevated bp during er visit as well as follow up appt from his heart surgery at [facility]. Pt medication adjusted and advisement for compliance to medication and low salt diet.  Pt also advised on complete tobacco cessation.  Pt subsequently seen in cardiology clinic for bp check which did show improvement -142/92. Requested clearance for pt to participate in cardiac rehab to [facility] in [place] care for non va services on 03/03/2019.  Able to speak to her today and she will fax the notes from the cardiologist indicating ok for pt participate.  Made aware that at present we are currently closed to pt due to national recommendation for Covid -19.  Authorization remains in effect pending completion of appt. Cherre Huger, BSN Cardiac and Training and development officer

## 2019-04-13 ENCOUNTER — Ambulatory Visit (HOSPITAL_COMMUNITY): Payer: Non-veteran care

## 2019-04-15 ENCOUNTER — Ambulatory Visit (HOSPITAL_COMMUNITY): Payer: Non-veteran care

## 2019-04-18 ENCOUNTER — Ambulatory Visit (HOSPITAL_COMMUNITY): Payer: Non-veteran care

## 2019-04-20 ENCOUNTER — Ambulatory Visit (HOSPITAL_COMMUNITY): Payer: Non-veteran care

## 2019-04-22 ENCOUNTER — Ambulatory Visit (HOSPITAL_COMMUNITY): Payer: Non-veteran care

## 2019-04-25 ENCOUNTER — Ambulatory Visit (HOSPITAL_COMMUNITY): Payer: Non-veteran care

## 2019-04-26 ENCOUNTER — Telehealth (HOSPITAL_COMMUNITY): Payer: Self-pay | Admitting: *Deleted

## 2019-04-27 ENCOUNTER — Ambulatory Visit (HOSPITAL_COMMUNITY): Payer: Non-veteran care

## 2019-04-29 ENCOUNTER — Ambulatory Visit (HOSPITAL_COMMUNITY): Payer: Non-veteran care

## 2019-05-19 ENCOUNTER — Other Ambulatory Visit: Payer: Self-pay

## 2019-05-19 ENCOUNTER — Emergency Department (HOSPITAL_COMMUNITY)
Admission: EM | Admit: 2019-05-19 | Discharge: 2019-05-19 | Disposition: A | Discharge status: Home / Self Care | Payer: No Typology Code available for payment source | Attending: Emergency Medicine | Admitting: Emergency Medicine

## 2019-05-19 ENCOUNTER — Emergency Department (HOSPITAL_COMMUNITY): Payer: No Typology Code available for payment source

## 2019-05-19 ENCOUNTER — Encounter (HOSPITAL_COMMUNITY): Payer: Self-pay

## 2019-05-19 DIAGNOSIS — F17210 Nicotine dependence, cigarettes, uncomplicated: Secondary | ICD-10-CM | POA: Diagnosis not present

## 2019-05-19 DIAGNOSIS — M545 Low back pain: Secondary | ICD-10-CM | POA: Insufficient documentation

## 2019-05-19 DIAGNOSIS — Z79899 Other long term (current) drug therapy: Secondary | ICD-10-CM | POA: Diagnosis not present

## 2019-05-19 DIAGNOSIS — E119 Type 2 diabetes mellitus without complications: Secondary | ICD-10-CM | POA: Diagnosis not present

## 2019-05-19 DIAGNOSIS — M542 Cervicalgia: Secondary | ICD-10-CM | POA: Diagnosis not present

## 2019-05-19 DIAGNOSIS — Z7982 Long term (current) use of aspirin: Secondary | ICD-10-CM | POA: Insufficient documentation

## 2019-05-19 DIAGNOSIS — Y939 Activity, unspecified: Secondary | ICD-10-CM | POA: Diagnosis not present

## 2019-05-19 DIAGNOSIS — I10 Essential (primary) hypertension: Secondary | ICD-10-CM | POA: Diagnosis not present

## 2019-05-19 DIAGNOSIS — Y92410 Unspecified street and highway as the place of occurrence of the external cause: Secondary | ICD-10-CM | POA: Diagnosis not present

## 2019-05-19 DIAGNOSIS — Y999 Unspecified external cause status: Secondary | ICD-10-CM | POA: Insufficient documentation

## 2019-05-19 HISTORY — DX: Essential (primary) hypertension: I10

## 2019-05-19 MED ORDER — ACETAMINOPHEN 500 MG PO TABS
1000.0000 mg | ORAL_TABLET | Freq: Once | ORAL | Status: AC
  Administered 2019-05-19: 1000 mg via ORAL
  Filled 2019-05-19: qty 2
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
## 2020-09-19 ENCOUNTER — Other Ambulatory Visit: Payer: Self-pay

## 2020-09-19 ENCOUNTER — Ambulatory Visit (INDEPENDENT_AMBULATORY_CARE_PROVIDER_SITE_OTHER): Payer: HRSA Program

## 2020-09-19 ENCOUNTER — Ambulatory Visit (HOSPITAL_COMMUNITY)
Admission: EM | Admit: 2020-09-19 | Discharge: 2020-09-19 | Disposition: A | Discharge status: Home / Self Care | Payer: HRSA Program | Attending: Family Medicine | Admitting: Family Medicine

## 2020-09-19 ENCOUNTER — Encounter (HOSPITAL_COMMUNITY): Payer: Self-pay

## 2020-09-19 DIAGNOSIS — S39012D Strain of muscle, fascia and tendon of lower back, subsequent encounter: Secondary | ICD-10-CM

## 2020-09-19 DIAGNOSIS — S39012A Strain of muscle, fascia and tendon of lower back, initial encounter: Secondary | ICD-10-CM | POA: Insufficient documentation

## 2020-09-19 DIAGNOSIS — Z20822 Contact with and (suspected) exposure to covid-19: Secondary | ICD-10-CM | POA: Insufficient documentation

## 2020-09-19 DIAGNOSIS — Y92410 Unspecified street and highway as the place of occurrence of the external cause: Secondary | ICD-10-CM | POA: Insufficient documentation

## 2020-09-19 DIAGNOSIS — E119 Type 2 diabetes mellitus without complications: Secondary | ICD-10-CM | POA: Insufficient documentation

## 2020-09-19 DIAGNOSIS — R9389 Abnormal findings on diagnostic imaging of other specified body structures: Secondary | ICD-10-CM | POA: Insufficient documentation

## 2020-09-19 DIAGNOSIS — M79642 Pain in left hand: Secondary | ICD-10-CM | POA: Diagnosis not present

## 2020-09-19 DIAGNOSIS — M47816 Spondylosis without myelopathy or radiculopathy, lumbar region: Secondary | ICD-10-CM

## 2020-09-19 DIAGNOSIS — R0781 Pleurodynia: Secondary | ICD-10-CM

## 2020-09-19 DIAGNOSIS — Z951 Presence of aortocoronary bypass graft: Secondary | ICD-10-CM | POA: Insufficient documentation

## 2020-09-19 DIAGNOSIS — I119 Hypertensive heart disease without heart failure: Secondary | ICD-10-CM | POA: Insufficient documentation

## 2020-09-19 DIAGNOSIS — R05 Cough: Secondary | ICD-10-CM | POA: Insufficient documentation

## 2020-09-19 DIAGNOSIS — F17210 Nicotine dependence, cigarettes, uncomplicated: Secondary | ICD-10-CM | POA: Insufficient documentation

## 2020-09-19 DIAGNOSIS — R079 Chest pain, unspecified: Secondary | ICD-10-CM

## 2020-09-19 LAB — SARS CORONAVIRUS 2 (TAT 6-24 HRS): SARS Coronavirus 2: NEGATIVE

## 2020-09-19 MED ORDER — MELOXICAM 7.5 MG PO TABS: 7.5000 mg | ORAL_TABLET | Freq: Every day | ORAL | 0 refills | Status: DC

## 2020-09-19 NOTE — Discharge Instructions (Addendum)
You have been tested for COVID-19 today. If your test returns positive, you will receive a phone call from [facility] regarding your results. Negative test results are not called. Both positive and negative results area always visible on MyChart. If you do not have a MyChart account, sign up instructions are provided in your discharge papers. Please do not hesitate to contact us should you have questions or concerns.  We are testing you for COVID based on chest x-ray findings consistent with possible COVID.
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
## 2021-01-06 ENCOUNTER — Ambulatory Visit (HOSPITAL_COMMUNITY)
Admission: EM | Admit: 2021-01-06 | Discharge: 2021-01-06 | Disposition: A | Discharge status: Home / Self Care | Payer: No Typology Code available for payment source | Attending: Urgent Care | Admitting: Urgent Care

## 2021-01-06 ENCOUNTER — Other Ambulatory Visit: Payer: Self-pay

## 2021-01-06 ENCOUNTER — Ambulatory Visit (INDEPENDENT_AMBULATORY_CARE_PROVIDER_SITE_OTHER): Payer: No Typology Code available for payment source

## 2021-01-06 DIAGNOSIS — R9389 Abnormal findings on diagnostic imaging of other specified body structures: Secondary | ICD-10-CM | POA: Diagnosis present

## 2021-01-06 DIAGNOSIS — R059 Cough, unspecified: Secondary | ICD-10-CM | POA: Diagnosis present

## 2021-01-06 DIAGNOSIS — R0989 Other specified symptoms and signs involving the circulatory and respiratory systems: Secondary | ICD-10-CM | POA: Diagnosis not present

## 2021-01-06 DIAGNOSIS — R918 Other nonspecific abnormal finding of lung field: Secondary | ICD-10-CM | POA: Diagnosis present

## 2021-01-06 DIAGNOSIS — Z20822 Contact with and (suspected) exposure to covid-19: Secondary | ICD-10-CM | POA: Insufficient documentation

## 2021-01-06 DIAGNOSIS — R5383 Other fatigue: Secondary | ICD-10-CM | POA: Diagnosis present

## 2021-01-06 LAB — SARS CORONAVIRUS 2 (TAT 6-24 HRS): SARS Coronavirus 2: NEGATIVE

## 2021-01-06 MED ORDER — CEFDINIR 300 MG PO CAPS: 300.0000 mg | ORAL_CAPSULE | Freq: Two times a day (BID) | ORAL | 0 refills | Status: DC

## 2021-01-06 MED ORDER — AZITHROMYCIN 250 MG PO TABS: ORAL_TABLET | ORAL | 0 refills | Status: DC

## 2021-01-06 MED ORDER — BENZONATATE 100 MG PO CAPS: 100.0000 mg | ORAL_CAPSULE | Freq: Three times a day (TID) | ORAL | 0 refills | Status: DC | PRN

## 2021-01-06 NOTE — ED Provider Notes (Addendum)
[facility]   MRN: [id number] DOB: 04/13/1946  Subjective:   Maddyx Wieck is a 75 y.o. male presenting for 1 week history of cough, shortness of breath and fatigue.  Has a history of diabetes, high blood pressure.  Patient did have a chest x-ray about 2 weeks ago but states he was not feeling sick at that time.  States that they did see an abnormal spot but cannot recall anything else.  He is COVID vaccinated.  Denies history of respiratory disorders or asthma.  Patient is a smoker.  No current facility-administered medications for this encounter.  Current Outpatient Medications:  .  aspirin 81 MG tablet, Take 81 mg by mouth daily., Disp: , Rfl:  .  atorvastatin (LIPITOR) 40 MG tablet, Take by mouth., Disp: , Rfl:  .  buPROPion (WELLBUTRIN XL) 150 MG 24 hr tablet, Take by mouth., Disp: , Rfl:  .  carvedilol (COREG) 6.25 MG tablet, Take by mouth., Disp: , Rfl:  .  gabapentin (NEURONTIN) 100 MG capsule, Take by mouth., Disp: , Rfl:  .  meloxicam (MOBIC) 7.5 MG tablet, Take 1 tablet (7.5 mg total) by mouth daily., Disp: 30 tablet, Rfl: 0 .  nitroGLYCERIN (NITROSTAT) 0.4 MG SL tablet, Place under the tongue., Disp: , Rfl:  .  QUEtiapine (SEROQUEL) 100 MG tablet, Take by mouth., Disp: , Rfl:  .  traZODone (DESYREL) 50 MG tablet, Take by mouth., Disp: , Rfl:    No Known Allergies  Past Medical History:  Diagnosis Date  . Diabetes mellitus without complication ([place])   . Hypertension      Past Surgical History:  Procedure Laterality Date  . CARDIAC SURGERY      Family History  Family history unknown: Yes    Social History   Tobacco Use  . Smoking status: Current Some Day Smoker    Types: Cigarettes  . Smokeless tobacco: Never Used  Vaping Use  . Vaping Use: Never used  Substance Use Topics  . Alcohol use: No  . Drug use: Never    ROS   Objective:   Vitals: BP (!) 169/94 (BP Location: Right Arm)   Pulse 75   Temp 98.1 F (36.7 C) (Oral)    Resp 20   SpO2 97%   Physical Exam Constitutional:      General: He is not in acute distress.    Appearance: Normal appearance. He is well-developed. He is not ill-appearing, toxic-appearing or diaphoretic.  HENT:     Head: Normocephalic and atraumatic.     Right Ear: External ear normal.     Left Ear: External ear normal.     Nose: Nose normal.     Mouth/Throat:     Mouth: Mucous membranes are moist.     Pharynx: Oropharynx is clear.  Eyes:     General: No scleral icterus.    Extraocular Movements: Extraocular movements intact.     Pupils: Pupils are equal, round, and reactive to light.  Cardiovascular:     Rate and Rhythm: Normal rate and regular rhythm.     Heart sounds: Normal heart sounds. No murmur heard. No friction rub. No gallop.   Pulmonary:     Effort: Pulmonary effort is normal. No respiratory distress.     Breath sounds: No stridor. Rales present. No wheezing or rhonchi.  Neurological:     Mental Status: He is alert and oriented to person, place, and time.  Psychiatric:        Mood and  Affect: Mood normal.        Behavior: Behavior normal.        Thought Content: Thought content normal.     DG Chest 2 View  Result Date: 01/06/2021 CLINICAL DATA:  Rales. EXAM: CHEST - 2 VIEW COMPARISON:  Chest x-rays since January 13, 2019. Chest CT since January 13, 2019. FINDINGS: Bibasilar infiltrates remain, unchanged since previous studies. A small right effusion remains, also stable. The heart, hila, mediastinum, lungs, and pleura are otherwise unchanged. IMPRESSION: 1. Chronic bibasilar infiltrates, right greater than left, present since September of 2020. The etiology is unclear. Recommend pulmonary consultation. Bronchoscopy may be necessary to determine the etiology of the chronic opacities. 2. Small chronic right pleural effusion. 3. No other interval changes. Electronically Signed   By: Dorise Bullion III M.D   On: 01/06/2021 14:51     Assessment and Plan :   PDMP  not reviewed this encounter.  1. Pulmonary infiltrates   2. Cough   3. Fatigue, unspecified type   4. Abnormal chest x-ray     Will cover patient for persistent pneumonia with azithromycin, cefdinir.  Recommend close follow-up with his PCP, consideration for chest CT scan, referral to pulmonologist. Counseled patient on potential for adverse effects with medications prescribed/recommended today, ER and return-to-clinic precautions discussed, patient verbalized understanding.     Jaynee Eagles, PA-C 01/06/21 1520

## 2021-01-06 NOTE — Discharge Instructions (Signed)
I would like for you to start taking 2 different antibiotics for an ongoing pneumonia.  Please make sure that you follow-up with your regular doctor as you need a consultation and more imaging like a chest CT scan.

## 2021-01-06 NOTE — ED Triage Notes (Signed)
PT reports dry cough and fatigue for one week.

## 2021-05-07 ENCOUNTER — Institutional Professional Consult (permissible substitution): Payer: No Typology Code available for payment source | Admitting: Pulmonary Disease

## 2021-05-09 ENCOUNTER — Institutional Professional Consult (permissible substitution): Payer: No Typology Code available for payment source | Admitting: Pulmonary Disease

## 2021-05-17 ENCOUNTER — Encounter: Payer: Self-pay | Admitting: Pulmonary Disease

## 2021-05-17 ENCOUNTER — Other Ambulatory Visit: Payer: Self-pay

## 2021-05-17 ENCOUNTER — Ambulatory Visit (INDEPENDENT_AMBULATORY_CARE_PROVIDER_SITE_OTHER): Payer: No Typology Code available for payment source | Admitting: Pulmonary Disease

## 2021-05-17 VITALS — BP 126/70 | HR 74 | Temp 97.0°F | Ht 68.0 in | Wt 245.4 lb

## 2021-05-17 DIAGNOSIS — J181 Lobar pneumonia, unspecified organism: Secondary | ICD-10-CM | POA: Diagnosis not present

## 2021-05-17 DIAGNOSIS — F17200 Nicotine dependence, unspecified, uncomplicated: Secondary | ICD-10-CM | POA: Diagnosis not present

## 2021-05-17 DIAGNOSIS — R911 Solitary pulmonary nodule: Secondary | ICD-10-CM

## 2021-05-17 NOTE — Patient Instructions (Signed)
Thank you for visiting Dr. Valeta Harms at [facility]. Today we recommend the following: Orders Placed This Encounter  Procedures  . NM PET Image Initial (PI) Skull Base To Thigh  . CT Super D Chest Wo Contrast  . Ambulatory referral to Pulmonology  . ECHOCARDIOGRAM COMPLETE   Return in about 6 weeks (around 06/28/2021) for with APP or Dr. Valeta Harms.    Please do your part to reduce the spread of COVID-19.

## 2021-05-17 NOTE — H&P (View-Only) (Signed)
Synopsis: Referred in May 2022 for abnormal CT chest by [name]  Subjective:   PATIENT ID: Paul [name] GENDER: male DOB: [birth year], MRN: [id number]  Chief Complaint  Patient presents with  . Consult    Patient states that he was told by the [place] that they saw a spot on his lung. States that his breathing is fine, productive cough with off white sputum.    PMH of HTN, HLD, pre-diabetic, obese, smoker (40+ years), 0.5 ppd.  Patient is cared for at the [facility].  Patient was seen in the office by Dr. Mosetta Pigeon.  We appreciate the referral patient was referred for abnormal CT imaging and concern for need of bronchoscopy.  Patient has a possible concern of right lower lobe worsening consolidation on CT with a enlarging lung nodule.  Denied hemoptysis.  Longstanding history of smoking.  Pulmonary function test in January 2022 with an FEV1 of 1.34 L, 58% predicted, ratio of 77, DLCO of 38%, additionally has a history of ischemic cardiomyopathy with chronic systolic heart failure and ejection fraction of 25 to 30%, follows with [place] cardiology and has moderate pulmonary hypertension.  Dr. Mirian Capuchin referred here for evaluation to have bronchoscopy in [facility] due to the patient's multiple medical comorbidities and the potential for advanced bronchoscopy needs.  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  Echocardiogram completed around this time reveals an ejection fraction of 25 to 30% with grade 2 diastolic dysfunction patient had CT scan of the chest that was completed on 04/02/2021 this reveals some small paratracheal and subcarinal nodes a small unchanged right trace pleural effusion.  Additionally patient has worsening a right lower lobe consolidation.  Also found to have a 20.0 x 10 mm posterior right middle lobe  nodule that had increased in size of from 7 mm in August 2021.  Additionally these persistent basilar infiltrates and consolidation which measure approximately 6 3 cm in size.  This continued progression of the entire right lower lobe is concerning for malignancy versus atypical pneumonia or chronic infection.  Patient was referred for consideration of bronchoscopy.    Past Medical History:  Diagnosis Date  . Diabetes mellitus without complication ([place])   . Hypertension      Family History  Family history unknown: Yes     Past Surgical History:  Procedure Laterality Date  . CARDIAC SURGERY      Social History   Socioeconomic History  . Marital status: Single    Spouse name: Not on file  . Number of children: Not on file  . Years of education: Not on file  . Highest education level: Not on file  Occupational History  . Not on file  Tobacco Use  . Smoking status: Current Some Day Smoker    Packs/day: 0.50    Types: Cigarettes  . Smokeless tobacco: Never Used  Vaping Use  . Vaping Use: Never used  Substance and Sexual Activity  . Alcohol use: No  . Drug use: Never  . Sexual activity: Not on file  Other Topics Concern  . Not on file  Social History Narrative  . Not on file   Social Determinants of Health   Financial Resource Strain: Not on file  Food Insecurity: Not on file  Transportation Needs: Not on file  Physical Activity: Not on file  Stress: Not on  file  Social Connections: Not on file  Intimate Partner Violence: Not on file     No Known Allergies   Outpatient Medications Prior to Visit  Medication Sig Dispense Refill  . aspirin 81 MG tablet Take 81 mg by mouth daily.    Marland Kitchen atorvastatin (LIPITOR) 40 MG tablet Take by mouth.    Marland Kitchen azithromycin (ZITHROMAX) 250 MG tablet Start with 2 tablets today, then 1 tablet daily thereafter. 6 tablet 0  . benzonatate (TESSALON) 100 MG capsule Take 1-2 capsules (100-200 mg total) by mouth 3 (three) times daily as  needed. 60 capsule 0  . buPROPion (WELLBUTRIN XL) 150 MG 24 hr tablet Take by mouth.    . carvedilol (COREG) 6.25 MG tablet Take by mouth.    . cefdinir (OMNICEF) 300 MG capsule Take 1 capsule (300 mg total) by mouth 2 (two) times daily. 20 capsule 0  . gabapentin (NEURONTIN) 100 MG capsule Take by mouth.    . meloxicam (MOBIC) 7.5 MG tablet Take 1 tablet (7.5 mg total) by mouth daily. 30 tablet 0  . nitroGLYCERIN (NITROSTAT) 0.4 MG SL tablet Place under the tongue.    Marland Kitchen QUEtiapine (SEROQUEL) 100 MG tablet Take by mouth.    . traZODone (DESYREL) 50 MG tablet Take by mouth.     No facility-administered medications prior to visit.    Review of Systems  Constitutional: Negative for chills, fever, malaise/fatigue and weight loss.  HENT: Negative for hearing loss, sore throat and tinnitus.   Eyes: Negative for blurred vision and double vision.  Respiratory: Positive for shortness of breath. Negative for cough, hemoptysis, sputum production, wheezing and stridor.   Cardiovascular: Negative for chest pain, palpitations, orthopnea, leg swelling and PND.  Gastrointestinal: Negative for abdominal pain, constipation, diarrhea, heartburn, nausea and vomiting.  Genitourinary: Negative for dysuria, hematuria and urgency.  Musculoskeletal: Negative for joint pain and myalgias.  Skin: Negative for itching and rash.  Neurological: Negative for dizziness, tingling, weakness and headaches.  Endo/Heme/Allergies: Negative for environmental allergies. Does not bruise/bleed easily.  Psychiatric/Behavioral: Negative for depression. The patient is not nervous/anxious and does not have insomnia.   All other systems reviewed and are negative.    Objective:  Physical Exam Vitals reviewed.  Constitutional:      General: He is not in acute distress.    Appearance: He is well-developed. He is obese.  HENT:     Head: Normocephalic and atraumatic.  Eyes:     General: No scleral icterus.    Conjunctiva/sclera:  Conjunctivae normal.     Pupils: Pupils are equal, round, and reactive to light.  Neck:     Vascular: No JVD.     Trachea: No tracheal deviation.  Cardiovascular:     Rate and Rhythm: Normal rate and regular rhythm.     Heart sounds: Normal heart sounds. No murmur heard.   Pulmonary:     Effort: Pulmonary effort is normal. No tachypnea, accessory muscle usage or respiratory distress.     Breath sounds: No stridor. No wheezing, rhonchi or rales.     Comments: Diminished breath sounds in the bilateral bases Abdominal:     General: Bowel sounds are normal. There is no distension.     Palpations: Abdomen is soft.     Tenderness: There is no abdominal tenderness.  Musculoskeletal:        General: No tenderness.     Cervical back: Neck supple.  Lymphadenopathy:     Cervical: No cervical adenopathy.  Skin:  General: Skin is warm and dry.     Capillary Refill: Capillary refill takes less than 2 seconds.     Findings: No rash.  Neurological:     Mental Status: He is alert and oriented to person, place, and time.  Psychiatric:        Behavior: Behavior normal.      Vitals:   05/17/21 1553  BP: 126/70  Pulse: 74  Temp: (!) 97 F (36.1 C)  TempSrc: Temporal  SpO2: 100%  Weight: 245 lb 6.4 oz (111.3 kg)  Height: 5\' 8"  (1.727 m)   100% on RA BMI Readings from Last 3 Encounters:  05/17/21 37.31 kg/m  05/19/19 31.93 kg/m  01/13/19 33.45 kg/m   Wt Readings from Last 3 Encounters:  05/17/21 245 lb 6.4 oz (111.3 kg)  05/19/19 210 lb (95.3 kg)  01/13/19 220 lb (99.8 kg)     CBC    Component Value Date/Time   WBC 8.6 01/13/2019 1109   RBC 4.18 (L) 01/13/2019 1109   HGB 12.4 (L) 01/13/2019 1109   HCT 41.2 01/13/2019 1109   PLT 162 01/13/2019 1109   MCV 98.6 01/13/2019 1109   MCH 29.7 01/13/2019 1109   MCHC 30.1 01/13/2019 1109   RDW 17.2 (H) 01/13/2019 1109   LYMPHSABS 1.8 01/13/2019 1109   MONOABS 1.0 01/13/2019 1109   EOSABS 0.3 01/13/2019 1109   BASOSABS  0.1 01/13/2019 1109    ECHO VAMC: EF25%   Chest Imaging: January 2022 chest x-ray: Bilateral lower lobe consolidation The patient's images have been independently reviewed by me.    CT scan report [facility]. Completed 04/19/2021: Consolidation approximately 3 cm basilar left anterior lower lobe increased in size, 20 x 10 mm posterior right middle lobe nodule increased in size from 7 mm concerning for malignancy. Report reviewed  Pulmonary Functions Testing Results: No flowsheet data found.  FeNO: no  Pathology: no  Echocardiogram: no  Heart Catheterization: no    Assessment & Plan:     ICD-10-CM   1. Lung nodule  R91.1 ECHOCARDIOGRAM COMPLETE    NM PET Image Initial (PI) Skull Base To Thigh    CT Super D Chest Wo Contrast    Ambulatory referral to Pulmonology    Procedural/ Surgical Case Request: VIDEO BRONCHOSCOPY WITH ENDOBRONCHIAL NAVIGATION  2. 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 Current smoker  F17.200     Discussion:  This is a 75 year old gentleman, current smoker, ischemic cardiomyopathy, pulmonary hypertension.  Found to have a enlarging right middle lobe nodule with persistent left lower lobe basilar consolidation concerning for malignancy. We discussed the CT reports brought with him from the [place].  Plan: I think he needs a repeat echo and has been at least 2 years per documentation that I can find. Would prefer to have some kind of statement from cardiology about restratification for general anesthesia. I do think he is high risk for general anesthesia but I feel that he is medically optimized and does not appear to be suffering from any significant symptoms related to heart failure or pulmonary hypertension.  We will obtain a echocardiogram. I have ordered a nuclear medicine pet image. He will also need a super D CT for planning. Patient to follow-up with [place] after bronchoscopy.  We appreciate the referral from Dr. Gwenette Greet at the [facility].    Current Outpatient Medications:  .  aspirin 81 MG tablet, Take 81 mg by mouth daily., Disp: , Rfl:  .  atorvastatin (LIPITOR) 40 MG tablet, Take by mouth., Disp: , Rfl:  .  azithromycin (ZITHROMAX) 250 MG tablet, Start with 2 tablets today, then 1 tablet daily thereafter., Disp: 6 tablet, Rfl: 0 .  benzonatate (TESSALON) 100 MG capsule, Take 1-2 capsules (100-200 mg total) by mouth 3 (three) times daily as needed., Disp: 60 capsule, Rfl: 0 .  buPROPion (WELLBUTRIN XL) 150 MG 24 hr tablet, Take by mouth., Disp: , Rfl:  .  carvedilol (COREG) 6.25 MG tablet, Take by mouth., Disp: , Rfl:  .  cefdinir (OMNICEF) 300 MG capsule, Take 1 capsule (300 mg total) by mouth 2 (two) times daily., Disp: 20 capsule, Rfl: 0 .  gabapentin (NEURONTIN) 100 MG capsule, Take by mouth., Disp: , Rfl:  .  meloxicam (MOBIC) 7.5 MG tablet, Take 1 tablet (7.5 mg total) by mouth daily., Disp: 30 tablet, Rfl: 0 .  nitroGLYCERIN (NITROSTAT) 0.4 MG SL tablet, Place under the tongue., Disp: , Rfl:  .  QUEtiapine (SEROQUEL) 100 MG tablet, Take by mouth., Disp: , Rfl:  .  traZODone (DESYREL) 50 MG tablet, Take by mouth., Disp: , Rfl:   I spent 65 minutes dedicated to the care of this patient on the date of this encounter to include pre-visit review of records, face-to-face time with the patient discussing conditions above, post visit ordering of testing, clinical documentation with the electronic health record, making appropriate referrals as documented, and communicating necessary findings to members of the patients care team.   Garner Nash, [place] [facility] 05/17/2021 4:16 PM

## 2021-05-17 NOTE — Progress Notes (Signed)
Synopsis: Referred in May 2022 for abnormal CT chest by [name]  Subjective:   PATIENT ID: Paul [name] GENDER: male DOB: 11/21/1946, MRN: [id number]  Chief Complaint  Patient presents with  . Consult    Patient states that he was told by the [place] that they saw a spot on his lung. States that his breathing is fine, productive cough with off white sputum.    PMH of HTN, HLD, pre-diabetic, obese, smoker (40+ years), 0.5 ppd.  Patient is cared for at the [facility].  Patient was seen in the office by Dr. Mosetta Pigeon.  We appreciate the referral patient was referred for abnormal CT imaging and concern for need of bronchoscopy.  Patient has a possible concern of right lower lobe worsening consolidation on CT with a enlarging lung nodule.  Denied hemoptysis.  Longstanding history of smoking.  Pulmonary function test in January 2022 with an FEV1 of 1.34 L, 58% predicted, ratio of 77, DLCO of 38%, additionally has a history of ischemic cardiomyopathy with chronic systolic heart failure and ejection fraction of 25 to 30%, follows with [place] cardiology and has moderate pulmonary hypertension.  Dr. Mirian Capuchin referred here for evaluation to have bronchoscopy in [facility] due to the patient's multiple medical comorbidities and the potential for advanced bronchoscopy needs.  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  Echocardiogram completed around this time reveals an ejection fraction of 25 to 30% with grade 2 diastolic dysfunction patient had CT scan of the chest that was completed on 04/02/2021 this reveals some small paratracheal and subcarinal nodes a small unchanged right trace pleural effusion.  Additionally patient has worsening a right lower lobe consolidation.  Also found to have a 20.0 x 10 mm posterior right middle lobe  nodule that had increased in size of from 7 mm in August 2021.  Additionally these persistent basilar infiltrates and consolidation which measure approximately 6 3 cm in size.  This continued progression of the entire right lower lobe is concerning for malignancy versus atypical pneumonia or chronic infection.  Patient was referred for consideration of bronchoscopy.    Past Medical History:  Diagnosis Date  . Diabetes mellitus without complication ([place])   . Hypertension      Family History  Family history unknown: Yes     Past Surgical History:  Procedure Laterality Date  . CARDIAC SURGERY      Social History   Socioeconomic History  . Marital status: Single    Spouse name: Not on file  . Number of children: Not on file  . Years of education: Not on file  . Highest education level: Not on file  Occupational History  . Not on file  Tobacco Use  . Smoking status: Current Some Day Smoker    Packs/day: 0.50    Types: Cigarettes  . Smokeless tobacco: Never Used  Vaping Use  . Vaping Use: Never used  Substance and Sexual Activity  . Alcohol use: No  . Drug use: Never  . Sexual activity: Not on file  Other Topics Concern  . Not on file  Social History Narrative  . Not on file   Social Determinants of Health   Financial Resource Strain: Not on file  Food Insecurity: Not on file  Transportation Needs: Not on file  Physical Activity: Not on file  Stress: Not on  file  Social Connections: Not on file  Intimate Partner Violence: Not on file     No Known Allergies   Outpatient Medications Prior to Visit  Medication Sig Dispense Refill  . aspirin 81 MG tablet Take 81 mg by mouth daily.    Marland Kitchen atorvastatin (LIPITOR) 40 MG tablet Take by mouth.    Marland Kitchen azithromycin (ZITHROMAX) 250 MG tablet Start with 2 tablets today, then 1 tablet daily thereafter. 6 tablet 0  . benzonatate (TESSALON) 100 MG capsule Take 1-2 capsules (100-200 mg total) by mouth 3 (three) times daily as  needed. 60 capsule 0  . buPROPion (WELLBUTRIN XL) 150 MG 24 hr tablet Take by mouth.    . carvedilol (COREG) 6.25 MG tablet Take by mouth.    . cefdinir (OMNICEF) 300 MG capsule Take 1 capsule (300 mg total) by mouth 2 (two) times daily. 20 capsule 0  . gabapentin (NEURONTIN) 100 MG capsule Take by mouth.    . meloxicam (MOBIC) 7.5 MG tablet Take 1 tablet (7.5 mg total) by mouth daily. 30 tablet 0  . nitroGLYCERIN (NITROSTAT) 0.4 MG SL tablet Place under the tongue.    Marland Kitchen QUEtiapine (SEROQUEL) 100 MG tablet Take by mouth.    . traZODone (DESYREL) 50 MG tablet Take by mouth.     No facility-administered medications prior to visit.    Review of Systems  Constitutional: Negative for chills, fever, malaise/fatigue and weight loss.  HENT: Negative for hearing loss, sore throat and tinnitus.   Eyes: Negative for blurred vision and double vision.  Respiratory: Positive for shortness of breath. Negative for cough, hemoptysis, sputum production, wheezing and stridor.   Cardiovascular: Negative for chest pain, palpitations, orthopnea, leg swelling and PND.  Gastrointestinal: Negative for abdominal pain, constipation, diarrhea, heartburn, nausea and vomiting.  Genitourinary: Negative for dysuria, hematuria and urgency.  Musculoskeletal: Negative for joint pain and myalgias.  Skin: Negative for itching and rash.  Neurological: Negative for dizziness, tingling, weakness and headaches.  Endo/Heme/Allergies: Negative for environmental allergies. Does not bruise/bleed easily.  Psychiatric/Behavioral: Negative for depression. The patient is not nervous/anxious and does not have insomnia.   All other systems reviewed and are negative.    Objective:  Physical Exam Vitals reviewed.  Constitutional:      General: He is not in acute distress.    Appearance: He is well-developed. He is obese.  HENT:     Head: Normocephalic and atraumatic.  Eyes:     General: No scleral icterus.    Conjunctiva/sclera:  Conjunctivae normal.     Pupils: Pupils are equal, round, and reactive to light.  Neck:     Vascular: No JVD.     Trachea: No tracheal deviation.  Cardiovascular:     Rate and Rhythm: Normal rate and regular rhythm.     Heart sounds: Normal heart sounds. No murmur heard.   Pulmonary:     Effort: Pulmonary effort is normal. No tachypnea, accessory muscle usage or respiratory distress.     Breath sounds: No stridor. No wheezing, rhonchi or rales.     Comments: Diminished breath sounds in the bilateral bases Abdominal:     General: Bowel sounds are normal. There is no distension.     Palpations: Abdomen is soft.     Tenderness: There is no abdominal tenderness.  Musculoskeletal:        General: No tenderness.     Cervical back: Neck supple.  Lymphadenopathy:     Cervical: No cervical adenopathy.  Skin:  General: Skin is warm and dry.     Capillary Refill: Capillary refill takes less than 2 seconds.     Findings: No rash.  Neurological:     Mental Status: He is alert and oriented to person, place, and time.  Psychiatric:        Behavior: Behavior normal.      Vitals:   05/17/21 1553  BP: 126/70  Pulse: 74  Temp: (!) 97 F (36.1 C)  TempSrc: Temporal  SpO2: 100%  Weight: 245 lb 6.4 oz (111.3 kg)  Height: 5\' 8"  (1.727 m)   100% on RA BMI Readings from Last 3 Encounters:  05/17/21 37.31 kg/m  05/19/19 31.93 kg/m  01/13/19 33.45 kg/m   Wt Readings from Last 3 Encounters:  05/17/21 245 lb 6.4 oz (111.3 kg)  05/19/19 210 lb (95.3 kg)  01/13/19 220 lb (99.8 kg)     CBC    Component Value Date/Time   WBC 8.6 01/13/2019 1109   RBC 4.18 (L) 01/13/2019 1109   HGB 12.4 (L) 01/13/2019 1109   HCT 41.2 01/13/2019 1109   PLT 162 01/13/2019 1109   MCV 98.6 01/13/2019 1109   MCH 29.7 01/13/2019 1109   MCHC 30.1 01/13/2019 1109   RDW 17.2 (H) 01/13/2019 1109   LYMPHSABS 1.8 01/13/2019 1109   MONOABS 1.0 01/13/2019 1109   EOSABS 0.3 01/13/2019 1109   BASOSABS  0.1 01/13/2019 1109    ECHO VAMC: EF25%   Chest Imaging: January 2022 chest x-ray: Bilateral lower lobe consolidation The patient's images have been independently reviewed by me.    CT scan report [facility]. Completed 04/19/2021: Consolidation approximately 3 cm basilar left anterior lower lobe increased in size, 20 x 10 mm posterior right middle lobe nodule increased in size from 7 mm concerning for malignancy. Report reviewed  Pulmonary Functions Testing Results: No flowsheet data found.  FeNO: no  Pathology: no  Echocardiogram: no  Heart Catheterization: no    Assessment & Plan:     ICD-10-CM   1. Lung nodule  R91.1 ECHOCARDIOGRAM COMPLETE    NM PET Image Initial (PI) Skull Base To Thigh    CT Super D Chest Wo Contrast    Ambulatory referral to Pulmonology    Procedural/ Surgical Case Request: VIDEO BRONCHOSCOPY WITH ENDOBRONCHIAL NAVIGATION  2. Consolidation lung ([employer])  J18.1   3. Current smoker  F17.200     Discussion:  This is a 75 year old gentleman, current smoker, ischemic cardiomyopathy, pulmonary hypertension.  Found to have a enlarging right middle lobe nodule with persistent left lower lobe basilar consolidation concerning for malignancy. We discussed the CT reports brought with him from the [place].  Plan: I think he needs a repeat echo and has been at least 2 years per documentation that I can find. Would prefer to have some kind of statement from cardiology about restratification for general anesthesia. I do think he is high risk for general anesthesia but I feel that he is medically optimized and does not appear to be suffering from any significant symptoms related to heart failure or pulmonary hypertension.  We will obtain a echocardiogram. I have ordered a nuclear medicine pet image. He will also need a super D CT for planning. Patient to follow-up with [place] after bronchoscopy.  We appreciate the referral from Dr. Gwenette Greet at the [facility].    Current Outpatient Medications:  .  aspirin 81 MG tablet, Take 81 mg by mouth daily., Disp: , Rfl:  .  atorvastatin (LIPITOR) 40 MG tablet, Take by mouth., Disp: , Rfl:  .  azithromycin (ZITHROMAX) 250 MG tablet, Start with 2 tablets today, then 1 tablet daily thereafter., Disp: 6 tablet, Rfl: 0 .  benzonatate (TESSALON) 100 MG capsule, Take 1-2 capsules (100-200 mg total) by mouth 3 (three) times daily as needed., Disp: 60 capsule, Rfl: 0 .  buPROPion (WELLBUTRIN XL) 150 MG 24 hr tablet, Take by mouth., Disp: , Rfl:  .  carvedilol (COREG) 6.25 MG tablet, Take by mouth., Disp: , Rfl:  .  cefdinir (OMNICEF) 300 MG capsule, Take 1 capsule (300 mg total) by mouth 2 (two) times daily., Disp: 20 capsule, Rfl: 0 .  gabapentin (NEURONTIN) 100 MG capsule, Take by mouth., Disp: , Rfl:  .  meloxicam (MOBIC) 7.5 MG tablet, Take 1 tablet (7.5 mg total) by mouth daily., Disp: 30 tablet, Rfl: 0 .  nitroGLYCERIN (NITROSTAT) 0.4 MG SL tablet, Place under the tongue., Disp: , Rfl:  .  QUEtiapine (SEROQUEL) 100 MG tablet, Take by mouth., Disp: , Rfl:  .  traZODone (DESYREL) 50 MG tablet, Take by mouth., Disp: , Rfl:   I spent 65 minutes dedicated to the care of this patient on the date of this encounter to include pre-visit review of records, face-to-face time with the patient discussing conditions above, post visit ordering of testing, clinical documentation with the electronic health record, making appropriate referrals as documented, and communicating necessary findings to members of the patients care team.   Garner Nash, [place] [facility] 05/17/2021 4:16 PM

## 2021-05-20 ENCOUNTER — Telehealth: Payer: Self-pay | Admitting: Pulmonary Disease

## 2021-05-20 NOTE — Telephone Encounter (Signed)
Echo has been scheduled for 5/31 @ 7:45  AM at [facility].  Trying to give appt info to pt on this part.

## 2021-05-20 NOTE — Telephone Encounter (Signed)
Pt returned my call & I provided the ECHO info to him.

## 2021-05-20 NOTE — Telephone Encounter (Signed)
Pt stated he will call me back for me to provide his Echo info to him.

## 2021-05-20 NOTE — Telephone Encounter (Signed)
The following has been scheduled & pt has been made aware:  CT @ [facility] on 6/2 @ 9:30, check in by 9:14 w/  PET  @ [facility] following CT at 11 AM (NPO after midnight-avoid cards & sugars) COVID TEST 6/3 @ 1:30 @ [address] ENB @ [facility] [place] 6/7 @ 12:45 PM, pt checking in by 10:15  Requested URGENT cardiac Clearance w/ [facility].

## 2021-05-28 ENCOUNTER — Ambulatory Visit (HOSPITAL_COMMUNITY)
Admission: RE | Admit: 2021-05-28 | Discharge: 2021-05-28 | Disposition: A | Discharge status: Home / Self Care | Payer: No Typology Code available for payment source | Source: Ambulatory Visit | Attending: Pulmonary Disease | Admitting: Pulmonary Disease

## 2021-05-28 ENCOUNTER — Other Ambulatory Visit: Payer: Self-pay

## 2021-05-28 DIAGNOSIS — I10 Essential (primary) hypertension: Secondary | ICD-10-CM | POA: Insufficient documentation

## 2021-05-28 DIAGNOSIS — R911 Solitary pulmonary nodule: Secondary | ICD-10-CM

## 2021-05-28 DIAGNOSIS — I255 Ischemic cardiomyopathy: Secondary | ICD-10-CM

## 2021-05-28 DIAGNOSIS — E119 Type 2 diabetes mellitus without complications: Secondary | ICD-10-CM | POA: Insufficient documentation

## 2021-05-28 LAB — ECHOCARDIOGRAM COMPLETE
Area-P 1/2: 3.12 cm2
Calc EF: 33.5 %
S' Lateral: 4.8 cm
Single Plane A2C EF: 43.3 %
Single Plane A4C EF: 23.8 %

## 2021-05-28 NOTE — Progress Notes (Signed)
  Echocardiogram 2D Echocardiogram has been performed.  Jennette Dubin 05/28/2021, 9:02 AM

## 2021-05-30 ENCOUNTER — Ambulatory Visit (HOSPITAL_COMMUNITY)
Admission: RE | Admit: 2021-05-30 | Discharge: 2021-05-30 | Disposition: A | Discharge status: Home / Self Care | Payer: No Typology Code available for payment source | Source: Ambulatory Visit | Attending: Pulmonary Disease | Admitting: Pulmonary Disease

## 2021-05-30 ENCOUNTER — Other Ambulatory Visit: Payer: Self-pay

## 2021-05-30 DIAGNOSIS — R911 Solitary pulmonary nodule: Secondary | ICD-10-CM

## 2021-05-30 LAB — GLUCOSE, CAPILLARY
Glucose-Capillary: 402 mg/dL — ABNORMAL HIGH (ref 70–99)
Glucose-Capillary: 380 mg/dL — ABNORMAL HIGH (ref 70–99)

## 2021-05-30 MED ORDER — FLUDEOXYGLUCOSE F - 18 (FDG) INJECTION: 12.5000 | Freq: Once | INTRAVENOUS | Status: DC

## 2021-05-31 ENCOUNTER — Other Ambulatory Visit (HOSPITAL_COMMUNITY)
Admission: RE | Admit: 2021-05-31 | Discharge: 2021-05-31 | Disposition: A | Discharge status: Home / Self Care | Payer: No Typology Code available for payment source | Source: Ambulatory Visit | Attending: Pulmonary Disease | Admitting: Pulmonary Disease

## 2021-05-31 ENCOUNTER — Encounter (HOSPITAL_COMMUNITY): Payer: Self-pay | Admitting: Pulmonary Disease

## 2021-05-31 ENCOUNTER — Ambulatory Visit (HOSPITAL_COMMUNITY)
Admission: RE | Admit: 2021-05-31 | Discharge: 2021-05-31 | Disposition: A | Discharge status: Home / Self Care | Payer: No Typology Code available for payment source | Source: Ambulatory Visit | Attending: Pulmonary Disease | Admitting: Pulmonary Disease

## 2021-05-31 DIAGNOSIS — Z951 Presence of aortocoronary bypass graft: Secondary | ICD-10-CM | POA: Insufficient documentation

## 2021-05-31 DIAGNOSIS — R911 Solitary pulmonary nodule: Secondary | ICD-10-CM | POA: Diagnosis present

## 2021-05-31 DIAGNOSIS — I700 Atherosclerosis of aorta: Secondary | ICD-10-CM | POA: Diagnosis not present

## 2021-05-31 DIAGNOSIS — Z20822 Contact with and (suspected) exposure to covid-19: Secondary | ICD-10-CM | POA: Insufficient documentation

## 2021-05-31 DIAGNOSIS — I2510 Atherosclerotic heart disease of native coronary artery without angina pectoris: Secondary | ICD-10-CM | POA: Diagnosis not present

## 2021-05-31 LAB — SARS CORONAVIRUS 2 (TAT 6-24 HRS): SARS Coronavirus 2: NEGATIVE

## 2021-05-31 LAB — GLUCOSE, CAPILLARY: Glucose-Capillary: 152 mg/dL — ABNORMAL HIGH (ref 70–99)

## 2021-05-31 MED ORDER — FLUDEOXYGLUCOSE F - 18 (FDG) INJECTION
12.0000 | Freq: Once | INTRAVENOUS | Status: AC | PRN
  Administered 2021-05-31: 12 via INTRAVENOUS

## 2021-06-03 ENCOUNTER — Encounter (HOSPITAL_COMMUNITY): Payer: Self-pay | Admitting: Pulmonary Disease

## 2021-06-03 ENCOUNTER — Institutional Professional Consult (permissible substitution): Payer: No Typology Code available for payment source | Admitting: Pulmonary Disease

## 2021-06-03 NOTE — Anesthesia Preprocedure Evaluation (Addendum)
Anesthesia Evaluation  Patient identified by MRN, date of birth, ID band Patient awake    Reviewed: Allergy & Precautions, NPO status , Patient's Chart, lab work & pertinent test results, reviewed documented beta blocker date and time   History of Anesthesia Complications Negative for: history of anesthetic complications  Airway Mallampati: II  TM Distance: >3 FB Neck ROM: Full    Dental  (+) Dental Advisory Given   Pulmonary sleep apnea , Current Smoker and Patient abstained from smoking.,  Lung nodule   Pulmonary exam normal        Cardiovascular hypertension, Pt. on medications and Pt. on home beta blockers + CAD, + CABG (2019) and +CHF  Normal cardiovascular exam     Neuro/Psych negative neurological ROS     GI/Hepatic negative GI ROS, Neg liver ROS,   Endo/Other  diabetes, Poorly Controlled, Type 2Hypothyroidism   Renal/GU Renal InsufficiencyRenal disease  negative genitourinary   Musculoskeletal negative musculoskeletal ROS (+)   Abdominal   Peds  Hematology negative hematology ROS (+)   Anesthesia Other Findings  Echo 05/28/21: EF 30-35%, g1dd, normal RV function, mild LAD, mild RAD  Reproductive/Obstetrics                           Anesthesia Physical Anesthesia Plan  ASA: III  Anesthesia Plan: General   Post-op Pain Management:    Induction: Intravenous  PONV Risk Score and Plan: 1 and Ondansetron, Dexamethasone, Treatment may vary due to age or medical condition and Midazolam  Airway Management Planned: Oral ETT  Additional Equipment: None  Intra-op Plan:   Post-operative Plan: Extubation in OR  Informed Consent: I have reviewed the patients History and Physical, chart, labs and discussed the procedure including the risks, benefits and alternatives for the proposed anesthesia with the patient or authorized representative who has indicated his/her understanding and  acceptance.     Dental advisory given  Plan Discussed with:   Anesthesia Plan Comments: (PAT note written 06/03/2021 by Myra Gianotti, PA-C. SAME DAY WORK-UP   )       Anesthesia Quick Evaluation

## 2021-06-03 NOTE — Progress Notes (Signed)
Anesthesia Chart Review: SAME DAY WORK-UP   Case: [id number] Date/Time: 06/04/21 1245   Procedure: VIDEO BRONCHOSCOPY WITH ENDOBRONCHIAL NAVIGATION (Right )   Anesthesia type: General   Diagnosis: Lung nodule [R91.1]   Pre-op diagnosis: lung nodule   Location: [facility] ROOM 2 / [facility]   Surgeons: Garner Nash, DO      DISCUSSION: 75 year old male for the above procedure. He was referred from the [facility] to pulmonology for further work-up of RLL lung nodule.  Other history includes smoking, CAD (s/p CABG 10/04/18: LIMA-LAD, SVG-OM1, SVG-PDA by Duard Larsen, [facility] at [facility]), ischemic cardiomyopathy, chronic systolic CHF, HTN, DM2, OSA (not using CPAP), hypothyroidism. Lab trends from 2019-2020 suggest a degree of CKD (Cr ~ 1.2-1.5 range).   Currently, I do not have [facility] cardiology records readily available. From available records through [facility] and [facility], it appears he was diagnosed with ischemic CM in 2019 with EF 25%. He went on to have a CABG x3 on 10/04/18. Dr. Valeta Harms did order a preoperative echo that showed slightly improved EF to 30-35%, normal PASP, and trivial MR/TR (previously moderate).    By Dr. Juline Patch notes, chest CT 04/02/21 revealed some small paratracheal and subcarinal nodes a small unchanged right trace pleural effusion, worsening a right lower lobe consolidation, and a 20.0 x 10 mm posterior right middle lobe nodule that had increased in size of from 7 mm in August 2021. Given progression of entire RLL malignancy versus atypical or chronic infection included in differential. On 05/31/21 PET scan, maximum RLL SUV 6.3 and an enlarging area of consolidation anteriorly int he LLL with maximum SUV 5.0, and faintly hypermetabolic borderline enlarged right lower paratracheal lymph node.  05/31/2021 presurgical COVID-19 test negative.  Anesthesia team to evaluate on the day of surgery.   VS:  BP Readings from Last 3 Encounters:  05/17/21 126/70  01/06/21 (!) 169/94  09/19/20 (!)  168/87   Pulse Readings from Last 3 Encounters:  05/17/21 74  01/06/21 75  09/19/20 70    PROVIDERS: [facility], [name] (reportedly receives primary care, pulmonology [Dr. Lanny Hurst Clance], and cardiology through the [facility] health system).    LABS: For day of surgery.   Per Dr. Juline Patch note, "Pulmonary function test in January 2022 with an FEV1 of 1.34 L, 58% predicted, ratio of 77, DLCO of 38%"   IMAGES: PET Scan 05/31/21: IMPRESSION: 1. The progressively consolidated region in the right lower lobe has a maximum SUV of 6.3, and the enlarging area of consolidation anteriorly in the left lower lobe has a maximum SUV of 5.0. Possibilities include granulomatous consolidation or malignancy. 2. Faintly hypermetabolic borderline enlarged right lower paratracheal lymph node. 3. Other imaging findings of potential clinical significance: Aortic Atherosclerosis (ICD10-I70.0). Coronary atherosclerosis. Cardiomegaly with prior CABG. Left scrotal hydrocele.  CT Super D Chest 05/30/21: IMPRESSION: 1. Rounded consolidation in the medial RIGHT lower lobe with air bronchograms is suggestive of pulmonary infection. Cannot exclude neoplasm. 2. No evidence of mediastinal lymphadenopathy. Borderline enlarged RIGHT lower paratracheal node. 3. Post CABG   EKG: For day of surgery if no available EKGs within the past year. On 10/01/18 per [facility], EKG showed NSR, possible LAE, incomplete LBBB, inferior ST/T wave abnormality, prolonged QT.    CV: Echo 05/28/21: IMPRESSIONS  1. Left ventricular ejection fraction, by estimation, is 30 to 35%. The  left ventricle has moderately decreased function. The left ventricle has  no regional wall motion abnormalities. Left ventricular diastolic  parameters are consistent with Grade  I  diastolic dysfunction (impaired relaxation).  2. Right ventricular systolic function is normal. The right ventricular  size is normal. There is normal pulmonary artery  systolic pressure.  3. Left atrial size was mildly dilated.  4. Right atrial size was mildly dilated.  5. The mitral valve is grossly normal. Trivial mitral valve  regurgitation.  6. The aortic valve is grossly normal. Aortic valve regurgitation is not  visualized. No aortic stenosis is present.  (Comparison echo 09/03/18 at [place]: mild concentric LVH, LVEF < 25%, severe diffuse LV hypokinesis, grade 2 DD, moderate MR/TR, mildly dilated LA, RVSP 50-60 mmHg consistent with moderately severe pulmonary hypertension)  US Carotid 10/01/18: IMPRESSION:  1. No hemodynamically significant stenosis on either side.   2. Mild bilateral ICA plaque.  3. Both vertebral arteries are patent with antegrade flow.    Cardiac cath 05/03/18 ([facility]): PRE-CABG Findings:  1. Hemodynamics: Aortic pressure 152/90, LVEDP 28  2. Coronary system: Left Main: Large caliber vessel with no angiographic evidence of stenosis.  LAD system: Large caliber vessel, wraps around the apex. 80% ostial stenosis, 70% mid disease  LCX system: Moderate caliber vessel. 100% proximal stenosis there is a high OM branch that has 50% proximal disease.  RCA system: right dominant. 100% mid stenosis with faint left to rightcollaterals.   Conclusion:  Multivessel CAD as mentioned above.  Elevated LVEDP   Recommendations:  CT surgery consult for possible bypass  Continue risk factor modifications.  - S/p CABG x 3 (LIMA-LAD, SVG-OM1, SVG-PDA) 10/04/18   Past Medical History:  Diagnosis Date  . CHF (congestive heart failure) ([place])   . Coronary artery disease   . Diabetes mellitus without complication ([place])   . Hypertension   . Hypothyroidism   . Ischemic cardiomyopathy   . Pneumonia    a long time ago  . Sleep apnea    no longer uses a cpap    Past Surgical History:  Procedure Laterality Date  . CARDIAC SURGERY    . COLONOSCOPY    . CORONARY ARTERY BYPASS GRAFT     2019 at [place]: No current facility-administered medications for this encounter.   Marland Kitchen aspirin 81 MG tablet  . carvedilol (COREG) 25 MG tablet  . furosemide (LASIX) 20 MG tablet  . levothyroxine (SYNTHROID) 25 MCG tablet  . Menthol-Methyl Salicylate (MUSCLE RUB) 10-15 % CREA  . Multiple Vitamins-Minerals (MULTIVITAMIN WITH MINERALS) tablet  . nitroGLYCERIN (NITROSTAT) 0.4 MG SL tablet  . omeprazole (PRILOSEC) 20 MG capsule  . sacubitril-valsartan (ENTRESTO) 49-51 MG  . benzonatate (TESSALON) 100 MG capsule  . cefdinir (OMNICEF) 300 MG capsule    Myra Gianotti, PA-C Surgical Short Stay/Anesthesiology [facility] Phone [phone] [facility] Phone [phone] 06/03/2021 2:43 PM

## 2021-06-03 NOTE — Progress Notes (Signed)
Spoke with pt for pre-op call. Pt had a CABG in 2021. Pt denies any recent chest pain or sob. Pt is Veteran and goes to the [facility] in [place]. Have requested records from there. Pt is a type 2 diabetic. States since he has lost weight he no longer needs medications for his diabetes. Pt states he does not know what his A1C is or when he had it done. He gets it done at the [facility]. Pt states he does not check his blood sugar at home.   Covid test done 05/31/21 and it's negative. When asked he has been wearing a mask since the test was done he stated no and that no one told him he had to wear one. I instructed him to start now wearing a mask when he is out and around other people (he's at a carwash now).

## 2021-06-04 ENCOUNTER — Other Ambulatory Visit: Payer: Self-pay

## 2021-06-04 ENCOUNTER — Ambulatory Visit (HOSPITAL_COMMUNITY): Payer: No Typology Code available for payment source

## 2021-06-04 ENCOUNTER — Ambulatory Visit (HOSPITAL_COMMUNITY): Payer: No Typology Code available for payment source | Admitting: Vascular Surgery

## 2021-06-04 ENCOUNTER — Encounter (HOSPITAL_COMMUNITY): Payer: Self-pay | Admitting: Pulmonary Disease

## 2021-06-04 ENCOUNTER — Encounter (HOSPITAL_COMMUNITY)
Admission: RE | Disposition: A | Discharge status: Home / Self Care | Payer: Self-pay | Source: Home / Self Care | Attending: Pulmonary Disease

## 2021-06-04 ENCOUNTER — Ambulatory Visit (HOSPITAL_COMMUNITY)
Admission: RE | Admit: 2021-06-04 | Discharge: 2021-06-04 | Disposition: A | Discharge status: Home / Self Care | Payer: No Typology Code available for payment source | Attending: Pulmonary Disease | Admitting: Pulmonary Disease

## 2021-06-04 ENCOUNTER — Telehealth: Payer: Self-pay

## 2021-06-04 DIAGNOSIS — Z79899 Other long term (current) drug therapy: Secondary | ICD-10-CM | POA: Diagnosis not present

## 2021-06-04 DIAGNOSIS — E785 Hyperlipidemia, unspecified: Secondary | ICD-10-CM | POA: Insufficient documentation

## 2021-06-04 DIAGNOSIS — E119 Type 2 diabetes mellitus without complications: Secondary | ICD-10-CM | POA: Diagnosis not present

## 2021-06-04 DIAGNOSIS — I5022 Chronic systolic (congestive) heart failure: Secondary | ICD-10-CM | POA: Diagnosis not present

## 2021-06-04 DIAGNOSIS — E669 Obesity, unspecified: Secondary | ICD-10-CM | POA: Insufficient documentation

## 2021-06-04 DIAGNOSIS — Z791 Long term (current) use of non-steroidal anti-inflammatories (NSAID): Secondary | ICD-10-CM | POA: Insufficient documentation

## 2021-06-04 DIAGNOSIS — R911 Solitary pulmonary nodule: Secondary | ICD-10-CM

## 2021-06-04 DIAGNOSIS — C3431 Malignant neoplasm of lower lobe, right bronchus or lung: Secondary | ICD-10-CM | POA: Diagnosis not present

## 2021-06-04 DIAGNOSIS — R918 Other nonspecific abnormal finding of lung field: Secondary | ICD-10-CM

## 2021-06-04 DIAGNOSIS — I272 Pulmonary hypertension, unspecified: Secondary | ICD-10-CM | POA: Insufficient documentation

## 2021-06-04 DIAGNOSIS — Z7982 Long term (current) use of aspirin: Secondary | ICD-10-CM | POA: Insufficient documentation

## 2021-06-04 DIAGNOSIS — Z9889 Other specified postprocedural states: Secondary | ICD-10-CM

## 2021-06-04 DIAGNOSIS — I110 Hypertensive heart disease with heart failure: Secondary | ICD-10-CM | POA: Diagnosis not present

## 2021-06-04 DIAGNOSIS — I255 Ischemic cardiomyopathy: Secondary | ICD-10-CM | POA: Insufficient documentation

## 2021-06-04 DIAGNOSIS — Z419 Encounter for procedure for purposes other than remedying health state, unspecified: Secondary | ICD-10-CM

## 2021-06-04 DIAGNOSIS — F17210 Nicotine dependence, cigarettes, uncomplicated: Secondary | ICD-10-CM | POA: Insufficient documentation

## 2021-06-04 HISTORY — PX: VIDEO BRONCHOSCOPY WITH ENDOBRONCHIAL NAVIGATION: SHX6175

## 2021-06-04 HISTORY — DX: Ischemic cardiomyopathy: I25.5

## 2021-06-04 HISTORY — DX: Hypothyroidism, unspecified: E03.9

## 2021-06-04 HISTORY — PX: VIDEO BRONCHOSCOPY WITH ENDOBRONCHIAL ULTRASOUND: SHX6177

## 2021-06-04 HISTORY — PX: BRONCHIAL BRUSHINGS: SHX5108

## 2021-06-04 HISTORY — DX: Pneumonia, unspecified organism: J18.9

## 2021-06-04 HISTORY — PX: BRONCHIAL WASHINGS: SHX5105

## 2021-06-04 HISTORY — DX: Sleep apnea, unspecified: G47.30

## 2021-06-04 HISTORY — DX: Atherosclerotic heart disease of native coronary artery without angina pectoris: I25.10

## 2021-06-04 HISTORY — PX: BRONCHIAL BIOPSY: SHX5109

## 2021-06-04 HISTORY — PX: BRONCHIAL NEEDLE ASPIRATION BIOPSY: SHX5106

## 2021-06-04 HISTORY — DX: Heart failure, unspecified: I50.9

## 2021-06-04 LAB — GLUCOSE, CAPILLARY
Glucose-Capillary: 477 mg/dL — ABNORMAL HIGH (ref 70–99)
Glucose-Capillary: 449 mg/dL — ABNORMAL HIGH (ref 70–99)
Glucose-Capillary: 401 mg/dL — ABNORMAL HIGH (ref 70–99)
Glucose-Capillary: 326 mg/dL — ABNORMAL HIGH (ref 70–99)

## 2021-06-04 LAB — BASIC METABOLIC PANEL WITH GFR
Anion gap: 8 (ref 5–15)
BUN: 17 mg/dL (ref 8–23)
CO2: 25 mmol/L (ref 22–32)
Calcium: 9.5 mg/dL (ref 8.9–10.3)
Chloride: 102 mmol/L (ref 98–111)
Creatinine, Ser: 1.5 mg/dL — ABNORMAL HIGH (ref 0.61–1.24)
GFR, Estimated: 48 mL/min — ABNORMAL LOW
Glucose, Bld: 462 mg/dL — ABNORMAL HIGH (ref 70–99)
Potassium: 4.7 mmol/L (ref 3.5–5.1)
Sodium: 135 mmol/L (ref 135–145)

## 2021-06-04 SURGERY — VIDEO BRONCHOSCOPY WITH ENDOBRONCHIAL NAVIGATION
Anesthesia: General | Laterality: Right

## 2021-06-04 MED ORDER — CHLORHEXIDINE GLUCONATE 0.12 % MT SOLN
OROMUCOSAL | Status: AC
  Administered 2021-06-04: 15 mL via OROMUCOSAL
  Filled 2021-06-04: qty 15

## 2021-06-04 MED ORDER — SUGAMMADEX SODIUM 200 MG/2ML IV SOLN
INTRAVENOUS | Status: DC | PRN
  Administered 2021-06-04: 200 mg via INTRAVENOUS

## 2021-06-04 MED ORDER — CHLORHEXIDINE GLUCONATE 0.12 % MT SOLN
15.0000 mL | Freq: Once | OROMUCOSAL | Status: AC
  Filled 2021-06-04: qty 15

## 2021-06-04 MED ORDER — LACTATED RINGERS IV SOLN: INTRAVENOUS | Status: DC

## 2021-06-04 MED ORDER — DEXAMETHASONE SODIUM PHOSPHATE 10 MG/ML IJ SOLN
INTRAMUSCULAR | Status: DC | PRN
  Administered 2021-06-04: 5 mg via INTRAVENOUS

## 2021-06-04 MED ORDER — PROPOFOL 10 MG/ML IV BOLUS
INTRAVENOUS | Status: DC | PRN
  Administered 2021-06-04: 140 mg via INTRAVENOUS

## 2021-06-04 MED ORDER — ROCURONIUM BROMIDE 10 MG/ML (PF) SYRINGE
PREFILLED_SYRINGE | INTRAVENOUS | Status: DC | PRN
  Administered 2021-06-04: 70 mg via INTRAVENOUS

## 2021-06-04 MED ORDER — ONDANSETRON HCL 4 MG/2ML IJ SOLN
INTRAMUSCULAR | Status: DC | PRN
  Administered 2021-06-04: 4 mg via INTRAVENOUS

## 2021-06-04 MED ORDER — PHENYLEPHRINE HCL-NACL 10-0.9 MG/250ML-% IV SOLN
INTRAVENOUS | Status: DC | PRN
  Administered 2021-06-04: 50 ug/min via INTRAVENOUS

## 2021-06-04 MED ORDER — LIDOCAINE 2% (20 MG/ML) 5 ML SYRINGE
INTRAMUSCULAR | Status: DC | PRN
  Administered 2021-06-04: 100 mg via INTRAVENOUS

## 2021-06-04 MED ORDER — INSULIN ASPART 100 UNIT/ML IJ SOLN
10.0000 [IU] | Freq: Once | INTRAMUSCULAR | Status: AC
  Filled 2021-06-04: qty 0.1

## 2021-06-04 MED ORDER — INSULIN ASPART 100 UNIT/ML IJ SOLN
INTRAMUSCULAR | Status: AC
  Administered 2021-06-04: 10 [IU] via SUBCUTANEOUS
  Filled 2021-06-04: qty 1

## 2021-06-04 MED ORDER — FENTANYL CITRATE (PF) 100 MCG/2ML IJ SOLN
INTRAMUSCULAR | Status: DC | PRN
  Administered 2021-06-04: 100 ug via INTRAVENOUS

## 2021-06-04 MED ORDER — EPHEDRINE SULFATE-NACL 50-0.9 MG/10ML-% IV SOSY
PREFILLED_SYRINGE | INTRAVENOUS | Status: DC | PRN
  Administered 2021-06-04: 5 mg via INTRAVENOUS
  Administered 2021-06-04: 10 mg via INTRAVENOUS

## 2021-06-04 SURGICAL SUPPLY — 44 items
ADAPTER BRONCH F/PENTAX (ADAPTER) ×3 IMPLANT
ADAPTER VALVE BIOPSY EBUS (MISCELLANEOUS) IMPLANT
ADPTR VALVE BIOPSY EBUS (MISCELLANEOUS)
BRUSH CYTOL CELLEBRITY 1.5X140 (MISCELLANEOUS) ×3 IMPLANT
BRUSH SUPERTRAX BIOPSY (INSTRUMENTS) IMPLANT
BRUSH SUPERTRAX NDL-TIP CYTO (INSTRUMENTS) ×3 IMPLANT
CANISTER SUCT 3000ML PPV (MISCELLANEOUS) ×3 IMPLANT
CHANNEL WORK EXTEND EDGE 180 (KITS) IMPLANT
CHANNEL WORK EXTEND EDGE 45 (KITS) IMPLANT
CHANNEL WORK EXTEND EDGE 90 (KITS) IMPLANT
CONT SPEC 4OZ CLIKSEAL STRL BL (MISCELLANEOUS) ×3 IMPLANT
COVER BACK TABLE 60X90IN (DRAPES) ×3 IMPLANT
FILTER STRAW FLUID ASPIR (MISCELLANEOUS) IMPLANT
FORCEPS BIOP SUPERTRX PREMAR (INSTRUMENTS) ×3 IMPLANT
GAUZE SPONGE 4X4 12PLY STRL (GAUZE/BANDAGES/DRESSINGS) ×3 IMPLANT
GLOVE SURG SS PI 7.5 STRL IVOR (GLOVE) ×6 IMPLANT
GOWN STRL REUS W/ TWL LRG LVL3 (GOWN DISPOSABLE) ×4 IMPLANT
GOWN STRL REUS W/TWL LRG LVL3 (GOWN DISPOSABLE) ×2
KIT CLEAN ENDO COMPLIANCE (KITS) ×3 IMPLANT
KIT LOCATABLE GUIDE (CANNULA) IMPLANT
KIT MARKER FIDUCIAL DELIVERY (KITS) IMPLANT
KIT PROCEDURE EDGE 180 (KITS) IMPLANT
KIT PROCEDURE EDGE 45 (KITS) IMPLANT
KIT PROCEDURE EDGE 90 (KITS) IMPLANT
KIT TURNOVER KIT B (KITS) ×3 IMPLANT
MARKER SKIN DUAL TIP RULER LAB (MISCELLANEOUS) ×3 IMPLANT
NEEDLE SUPERTRX PREMARK BIOPSY (NEEDLE) ×3 IMPLANT
NS IRRIG 1000ML POUR BTL (IV SOLUTION) ×3 IMPLANT
OIL SILICONE PENTAX (PARTS (SERVICE/REPAIRS)) ×3 IMPLANT
PAD ARMBOARD 7.5X6 YLW CONV (MISCELLANEOUS) ×6 IMPLANT
PATCHES PATIENT (LABEL) ×9 IMPLANT
SOL ANTI FOG 6CC (MISCELLANEOUS) ×2 IMPLANT
SOLUTION ANTI FOG 6CC (MISCELLANEOUS) ×1
SYR 20CC LL (SYRINGE) ×3 IMPLANT
SYR 20ML ECCENTRIC (SYRINGE) ×3 IMPLANT
SYR 50ML SLIP (SYRINGE) ×3 IMPLANT
TOWEL OR 17X24 6PK STRL BLUE (TOWEL DISPOSABLE) ×3 IMPLANT
TRAP SPECIMEN MUCOUS 40CC (MISCELLANEOUS) IMPLANT
TUBE CONNECTING 20X1/4 (TUBING) ×3 IMPLANT
UNDERPAD 30X30 (UNDERPADS AND DIAPERS) ×3 IMPLANT
VALVE BIOPSY  SINGLE USE (MISCELLANEOUS) ×1
VALVE BIOPSY SINGLE USE (MISCELLANEOUS) ×2 IMPLANT
VALVE SUCTION BRONCHIO DISP (MISCELLANEOUS) ×3 IMPLANT
WATER STERILE IRR 1000ML POUR (IV SOLUTION) ×3 IMPLANT

## 2021-06-04 NOTE — Op Note (Signed)
Video Bronchoscopy with Electromagnetic Navigation Procedure Note Video Bronchoscopy with Endobronchial Ultrasound Procedure Note  Date of Operation: 06/04/2021  Pre-op Diagnosis: Right lower lobe lung mass, consolidation  Post-op Diagnosis: Right lower lobe lung mass, consolidation  Surgeon: Garner Nash, DO   Assistants: None   Anesthesia: General endotracheal anesthesia  Operation: Flexible video fiberoptic bronchoscopy with electromagnetic navigation and biopsies.  Estimated Blood Loss: Minimal  Complications: None   Indications and History: Paul [name] is a 75 y.o. male with right lower lobe lung mass, consolidation.  The risks, benefits, complications, treatment options and expected outcomes were discussed with the patient.  The possibilities of pneumothorax, pneumonia, reaction to medication, pulmonary aspiration, perforation of a viscus, bleeding, failure to diagnose a condition and creating a complication requiring transfusion or operation were discussed with the patient who freely signed the consent.    Description of Procedure: The patient was seen in the Preoperative Area, was examined and was deemed appropriate to proceed.  The patient was taken to [facility] endoscopy room 2, identified as Paul [name] and the procedure verified as Flexible Video Fiberoptic Bronchoscopy.  A Time Out was held and the above information confirmed.   Prior to the date of the procedure a high-resolution CT scan of the chest was performed. Utilizing [place] a virtual tracheobronchial tree was generated to allow the creation of distinct navigation pathways to the patient's parenchymal abnormalities. After being taken to the operating room general anesthesia was initiated and the patient  was orally intubated. The video fiberoptic bronchoscope was introduced via the endotracheal tube and a general inspection was performed which showed normal right and left lung anatomy no evidence of  endobronchial lesion.   Target #1 right lower lobe: The extendable working channel and locator guide were introduced into the bronchoscope. The distinct navigation pathways prepared prior to this procedure were then utilized to navigate to within 0.8 cm of patient's lesion(s) identified on CT scan.  Full fluoroscopic sweep was obtained with inspiratory breath-hold at APL of 20 cm of water from RAO 25 degrees to LAO 25 degrees. The extendable working channel was secured into place and the locator guide was withdrawn. Under fluoroscopic guidance transbronchial needle brushings, transbronchial Wang needle biopsies, and transbronchial forceps biopsies were performed to be sent for cytology and pathology.  Additional transbronchial biopsies were sent for tissue culture.  A bronchioalveolar lavage was performed in the RLL and sent for cytology and microbiology (bacterial, fungal, AFB smears and cultures).   Target #2 subcarinal lymph node endobronchial ultrasound:  The standard scope was then withdrawn and the endobronchial ultrasound was used to identify and characterize the peritracheal, hilar and bronchial lymph nodes. Inspection showed a very small 6 mm cross-section pretracheal node amongst many vessels, enlarged subcarinal node largest cross-section 1.25 to 1.5 cm. Using real-time ultrasound guidance Wang needle biopsies were take from Station 7 nodes and were sent for cytology.  Due to the small size of the pretracheal space node as well as inability to get a very clear window on approach decision was made to not biopsy the pretracheal space.  Standard therapeutic bronchoscope was inserted into the patient's airway and aspiration of the bilateral mainstem was necessary for removal of any remaining blood clots and debris.  All distal subsegments were patent at the termination of the procedure.  At the end of the procedure a general airway inspection was performed and there was no evidence of active bleeding.  The bronchoscope was removed.  The patient tolerated the procedure  well. There was no significant blood loss and there were no obvious complications. A post-procedural chest x-ray is pending.  [name] Target #1 right lower lobe: 1. Transbronchial needle brushings from right lower lobe 2. Transbronchial Wang needle biopsies from right lower lobe 3. Transbronchial forceps biopsies from right lower lobe 4. Bronchoalveolar lavage from right lower lobe  [name] Target #2 subcarinal lymph node: 1. Wang needle biopsies from station 7 node  Plans:  The patient will be discharged from the PACU to home when recovered from anesthesia and after chest x-ray is reviewed. We will review the cytology, pathology and microbiology results with the patient when they become available. Outpatient followup will be with Leory Plowman L Rafe Mackowski DO.   Garner Nash, DO [facility] 06/04/2021 2:34 PM

## 2021-06-04 NOTE — Anesthesia Procedure Notes (Signed)
Procedure Name: Intubation Date/Time: 06/04/2021 1:13 PM Performed by: Harden Mo, CRNA Pre-anesthesia Checklist: Patient identified, Emergency Drugs available, Suction available and Patient being monitored Patient Re-evaluated:Patient Re-evaluated prior to induction Oxygen Delivery Method: Circle System Utilized Preoxygenation: Pre-oxygenation with 100% oxygen Induction Type: IV induction Ventilation: Mask ventilation without difficulty and Oral airway inserted - appropriate to patient size Laryngoscope Size: Sabra Heck and 2 Grade View: Grade I Tube type: Oral Tube size: 8.5 mm Number of attempts: 1 Airway Equipment and Method: Stylet and Oral airway Placement Confirmation: ETT inserted through vocal cords under direct vision,  positive ETCO2 and breath sounds checked- equal and bilateral Secured at: 23 cm Tube secured with: Tape Dental Injury: Teeth and Oropharynx as per pre-operative assessment

## 2021-06-04 NOTE — Interval H&P Note (Signed)
History and Physical Interval Note:  06/04/2021 12:56 PM  Paul [name]  has presented today for surgery, with the diagnosis of lung nodule.  The various methods of treatment have been discussed with the patient and family. After consideration of risks, benefits and other options for treatment, the patient has consented to  Procedure(s): [place] (Right) as a surgical intervention.  The patient's history has been reviewed, patient examined, no change in status, stable for surgery.  I have reviewed the patient's chart and labs.  Questions were answered to the patient's satisfaction.     [place]

## 2021-06-04 NOTE — Transfer of Care (Signed)
Immediate Anesthesia Transfer of Care Note  Patient: Paul [name]  Procedure(s) Performed: VIDEO BRONCHOSCOPY WITH ENDOBRONCHIAL NAVIGATION (Right ) BRONCHIAL BRUSHINGS BRONCHIAL NEEDLE ASPIRATION BIOPSIES BRONCHIAL BIOPSIES BRONCHIAL WASHINGS VIDEO BRONCHOSCOPY WITH ENDOBRONCHIAL ULTRASOUND  Patient Location: Endoscopy Unit  Anesthesia Type:General  Level of Consciousness: awake and drowsy  Airway & Oxygen Therapy: Patient Spontanous Breathing and Patient connected to face mask oxygen  Post-op Assessment: Report given to RN, Post -op Vital signs reviewed and stable and Patient moving all extremities X 4  Post vital signs: Reviewed and stable  Last Vitals:  Vitals Value Taken Time  BP 143/81 06/04/21 1437  Temp    Pulse 58 06/04/21 1438  Resp 18 06/04/21 1438  SpO2 98 % 06/04/21 1438  Vitals shown include unvalidated device data.  Last Pain:  Vitals:   06/04/21 1127  TempSrc:   PainSc: 0-No pain      Patients Stated Pain Goal: 5 (70/78/67 [id number])  Complications: No complications documented.

## 2021-06-04 NOTE — Discharge Instructions (Signed)
Flexible Bronchoscopy, Care After This sheet gives you information about how to care for yourself after your test. Your doctor may also give you more specific instructions. If you have problems or questions, contact your doctor. Follow these instructions at home: Eating and drinking  Do not eat or drink anything (not even water) for 2 hours after your test, or until your numbing medicine (local anesthetic) wears off.  When your numbness is gone and your cough and gag reflexes have come back, you may: ? Eat only soft foods. ? Slowly drink liquids.  The day after the test, go back to your normal diet. Driving  Do not drive for 24 hours if you were given a medicine to help you relax (sedative).  Do not drive or use heavy machinery while taking prescription pain medicine. General instructions   Take over-the-counter and prescription medicines only as told by your doctor.  Return to your normal activities as told. Ask what activities are safe for you.  Do not use any products that have nicotine or tobacco in them. This includes cigarettes and e-cigarettes. If you need help quitting, ask your doctor.  Keep all follow-up visits as told by your doctor. This is important. It is very important if you had a tissue sample (biopsy) taken. Get help right away if:  You have shortness of breath that gets worse.  You get light-headed.  You feel like you are going to pass out (faint).  You have chest pain.  You cough up: ? More than a little blood. ? More blood than before. Summary  Do not eat or drink anything (not even water) for 2 hours after your test, or until your numbing medicine wears off.  Do not use cigarettes. Do not use e-cigarettes.  Get help right away if you have chest pain.   This information is not intended to replace advice given to you by your health care provider. Make sure you discuss any questions you have with your health care provider. Document Released:  10/12/2009 Document Revised: 11/27/2017 Document Reviewed: 01/02/2017 Elsevier Patient Education  2020 [employer].

## 2021-06-04 NOTE — Telephone Encounter (Signed)
Called and spoke with patient to get him scheduled for follow up. I have scheduled him for a follow up  Next Appt With Pulmonology ([place], DO) 07/04/2021 at 2:30 PM  Nothing further needed at this time.

## 2021-06-04 NOTE — Anesthesia Postprocedure Evaluation (Signed)
Anesthesia Post Note  Patient: Paul [name]  Procedure(s) Performed: VIDEO BRONCHOSCOPY WITH ENDOBRONCHIAL NAVIGATION (Right ) BRONCHIAL BRUSHINGS BRONCHIAL NEEDLE ASPIRATION BIOPSIES BRONCHIAL BIOPSIES BRONCHIAL WASHINGS VIDEO BRONCHOSCOPY WITH ENDOBRONCHIAL ULTRASOUND     Patient location during evaluation: PACU Anesthesia Type: General Level of consciousness: awake and alert Pain management: pain level controlled Vital Signs Assessment: post-procedure vital signs reviewed and stable Respiratory status: spontaneous breathing, nonlabored ventilation and respiratory function stable Cardiovascular status: blood pressure returned to baseline and stable Postop Assessment: no apparent nausea or vomiting Anesthetic complications: no   No complications documented.  Last Vitals:  Vitals:   06/04/21 1456 06/04/21 1506  BP: 140/69 140/70  Pulse: (!) 56 (!) 59  Resp: (!) 29 20  Temp:    SpO2: 95% 95%    Last Pain:  Vitals:   06/04/21 1506  TempSrc:   PainSc: 0-No pain                 Lidia Collum

## 2021-06-04 NOTE — Progress Notes (Addendum)
On arrival to short stay  Blood sugar 466, non symptomatic. Notified Dr. Christella Hartigan, new orders received. 10 units sq insulin given 1120. Pt. Also  Stated he drank 12 oz. Water at 0700., Dr. Christella Hartigan notified.  Continue to monitor. CBG 401, notified Dr. Christella Hartigan. No new orders, continue to [facility].

## 2021-06-04 NOTE — Telephone Encounter (Signed)
-----   Message from Garner Nash, DO sent at 06/04/2021  2:30 PM EDT ----- Hello,   Can you get a 34min follow up with me in the next 3-4 weeks?  Thanks  [employer]

## 2021-06-05 LAB — ACID FAST SMEAR (AFB, MYCOBACTERIA)
Acid Fast Smear: NEGATIVE
Acid Fast Smear: NEGATIVE

## 2021-06-05 LAB — CYTOLOGY - NON PAP

## 2021-06-06 ENCOUNTER — Encounter (HOSPITAL_COMMUNITY): Payer: Self-pay | Admitting: Pulmonary Disease

## 2021-06-06 ENCOUNTER — Telehealth: Payer: Self-pay | Admitting: Pulmonary Disease

## 2021-06-06 DIAGNOSIS — C3491 Malignant neoplasm of unspecified part of right bronchus or lung: Secondary | ICD-10-CM

## 2021-06-06 LAB — CULTURE, BAL-QUANTITATIVE W GRAM STAIN: Culture: 20000 — AB

## 2021-06-06 NOTE — Telephone Encounter (Signed)
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  Referral has been placed to medical oncology.  We will need to obtain [facility] approval if possible.  Referral placed to see Dr. Earlie Server.  Paul Nash, DO [facility] 06/06/2021 5:01 PM

## 2021-06-07 ENCOUNTER — Telehealth: Payer: Self-pay | Admitting: Internal Medicine

## 2021-06-07 NOTE — Telephone Encounter (Signed)
Scheduled appt per 6/9 referral. Pt aware.

## 2021-06-07 NOTE — Telephone Encounter (Signed)
Auth request faxed to the [facility]

## 2021-06-09 LAB — AEROBIC/ANAEROBIC CULTURE W GRAM STAIN (SURGICAL/DEEP WOUND): Culture: NO GROWTH

## 2021-06-09 LAB — ANAEROBIC CULTURE W GRAM STAIN

## 2021-06-17 ENCOUNTER — Other Ambulatory Visit: Payer: Self-pay

## 2021-06-17 ENCOUNTER — Encounter: Payer: Self-pay | Admitting: Emergency Medicine

## 2021-06-17 ENCOUNTER — Inpatient Hospital Stay: Payer: No Typology Code available for payment source | Attending: Internal Medicine | Admitting: Internal Medicine

## 2021-06-17 ENCOUNTER — Encounter: Payer: Self-pay | Admitting: *Deleted

## 2021-06-17 VITALS — BP 161/96 | HR 95 | Temp 96.4°F | Resp 19 | Ht 68.0 in | Wt 234.9 lb

## 2021-06-17 DIAGNOSIS — Z5111 Encounter for antineoplastic chemotherapy: Secondary | ICD-10-CM | POA: Insufficient documentation

## 2021-06-17 DIAGNOSIS — F17210 Nicotine dependence, cigarettes, uncomplicated: Secondary | ICD-10-CM

## 2021-06-17 DIAGNOSIS — C3431 Malignant neoplasm of lower lobe, right bronchus or lung: Secondary | ICD-10-CM | POA: Insufficient documentation

## 2021-06-17 DIAGNOSIS — C3490 Malignant neoplasm of unspecified part of unspecified bronchus or lung: Secondary | ICD-10-CM

## 2021-06-17 DIAGNOSIS — R059 Cough, unspecified: Secondary | ICD-10-CM | POA: Diagnosis not present

## 2021-06-17 DIAGNOSIS — R350 Frequency of micturition: Secondary | ICD-10-CM

## 2021-06-17 DIAGNOSIS — C3491 Malignant neoplasm of unspecified part of right bronchus or lung: Secondary | ICD-10-CM

## 2021-06-17 DIAGNOSIS — R5383 Other fatigue: Secondary | ICD-10-CM

## 2021-06-17 DIAGNOSIS — Z5112 Encounter for antineoplastic immunotherapy: Secondary | ICD-10-CM

## 2021-06-17 MED ORDER — CYANOCOBALAMIN 1000 MCG/ML IJ SOLN
1000.0000 ug | Freq: Once | INTRAMUSCULAR | Status: DC
Start: 2021-06-17 — End: 2021-06-18

## 2021-06-17 MED ORDER — PROCHLORPERAZINE MALEATE 10 MG PO TABS: 10.0000 mg | ORAL_TABLET | Freq: Four times a day (QID) | ORAL | 0 refills | Status: DC | PRN

## 2021-06-17 MED ORDER — FOLIC ACID 1 MG PO TABS: 1.0000 mg | ORAL_TABLET | Freq: Every day | ORAL | 4 refills | Status: DC

## 2021-06-17 MED ORDER — CYANOCOBALAMIN 1000 MCG/ML IJ SOLN
1000.0000 ug | Freq: Once | INTRAMUSCULAR | Status: AC
  Administered 2021-06-17: 1000 ug via INTRAMUSCULAR

## 2021-06-17 MED ORDER — CYANOCOBALAMIN 1000 MCG/ML IJ SOLN
INTRAMUSCULAR | Status: AC
  Filled 2021-06-17: qty 1

## 2021-06-17 NOTE — Research (Signed)
Trial:  [id number] NSCLC - Customer service manager for the Discovery and Validation of Biomarkers for the Prediction, Diagnosis, and Management of Disease  Patient Paul [name] was identified by this Research officer, political party as a potential candidate for the above listed study.  This Clinical Research Coordinator met with Paul [name], [id number], on 06/17/21 in a manner and location that ensures patient privacy to discuss participation in the above listed research study.  Patient is Unaccompanied.  A copy of the informed consent document and separate HIPAA Authorization was provided to the patient.  Patient reads, speaks, and understands [place].   Patient was provided with the business card of this Coordinator and encouraged to contact the research team with any questions.  Approximately 10 minutes were spent with the patient reviewing the informed consent documents.  Patient was provided the option of taking informed consent documents home to review and was encouraged to review at their convenience with their support network, including other care providers. Patient took the consent documents home to review. Will follow up with the patient next week regarding interest in this study.  Clabe Seal Clinical Research Coordinator I  06/17/21 3:28 PM

## 2021-06-17 NOTE — Progress Notes (Signed)
[place] Telephone:(336) [phone]   [phone]) [phone]  CONSULT NOTE  REFERRING PHYSICIAN: Dr. Leory Plowman Icard  REASON FOR CONSULTATION:  75 years old African-American male with recently diagnosed lung cancer.  HPI Matias Thurman is a 75 y.o. male with past medical history significant for congestive heart failure, coronary artery disease, diabetes mellitus, hypertension, hypothyroidism as well as ischemic cardiomyopathy and sleep apnea.  The patient was followed by his primary care physician at the [facility] clinic in [facility] and was noted on physical exam to have rales in his lung.  Chest x-ray on January 06, 2021 showed chronic bibasilar infiltrate right greater than left present since September 2020.  He was referred to Dr. Valeta Harms for evaluation of his condition and consideration of bronchoscopy.  CT super D of the chest without contrast on 05/30/2021 showed consolidation in the medial right lower lobe with air bronchograms.  The consolidative mass/process measures 7.5 x 5.1 cm.  There was also right lower lobe paratracheal lymph node measuring 1.0.  The patient had a PET scan on May 31, 2021 and it showed the progressive consolidated region and the right lower lobe has a maximum SUV of 6.3 and the enlarging area of consolidation anteriorly in the left lower lobe has maximum SUV of 5.0.  The possibilities include granulomatous consolidation or malignancy.  There was faintly hypermetabolic borderline enlarged right lower lobe paratracheal lymph node.  On 06/04/2021 the patient underwent video bronchoscopy with electromagnetic navigation procedure as well as endobronchial ultrasound and biopsy of the right lower lobe consolidation. The final pathology ([id number]) showed malignant cells consistent with adenocarcinoma. Dr. Valeta Harms kindly referred the patient to me today for evaluation and recommendation regarding treatment of his condition. When seen today the patient is  feeling fine with no concerning complaints except for urine frequency and mild cough.  He denied having any chest pain, shortness of breath or hemoptysis.  He denied having any fever or chills.  He has no nausea, vomiting, diarrhea or constipation.  He has no headache or visual changes.  He denied having any significant weight loss or night sweats. Family history significant for mother and father with heart disease. The patient is single and has 2 children a son and daughter.  He was in the [employer] and then worked at [employer]. he has a history of smoking 0.5 pack/day for around 55 years and unfortunately he continues to smoke.  He has no history of alcohol or drug abuse.   HPI  Past Medical History:  Diagnosis Date   CHF (congestive heart failure) ([place])    Coronary artery disease    Diabetes mellitus without complication ([place])    Hypertension    Hypothyroidism    Ischemic cardiomyopathy    Pneumonia    a long time ago   Sleep apnea    no longer uses a cpap    Past Surgical History:  Procedure Laterality Date   BRONCHIAL BIOPSY  06/04/2021   Procedure: BRONCHIAL BIOPSIES;  Surgeon: Garner Nash, DO;  Location: [place] [facility];  Service: Pulmonary;;   BRONCHIAL BRUSHINGS  06/04/2021   Procedure: BRONCHIAL BRUSHINGS;  Surgeon: Garner Nash, DO;  Location: [place] [facility];  Service: Pulmonary;;   BRONCHIAL NEEDLE ASPIRATION BIOPSY  06/04/2021   Procedure: BRONCHIAL NEEDLE ASPIRATION BIOPSIES;  Surgeon: Garner Nash, DO;  Location: [place] [facility];  Service: Pulmonary;;   BRONCHIAL WASHINGS  06/04/2021   Procedure: BRONCHIAL WASHINGS;  Surgeon: Garner Nash, DO;  Location:  [place] [facility];  Service: Pulmonary;;   CARDIAC SURGERY     COLONOSCOPY     CORONARY ARTERY BYPASS GRAFT     2019 at Standard Right 06/04/2021   Procedure: VIDEO BRONCHOSCOPY WITH ENDOBRONCHIAL NAVIGATION;  Surgeon: Garner Nash, DO;  Location: [place];  Service: Pulmonary;  Laterality: Right;   VIDEO BRONCHOSCOPY WITH ENDOBRONCHIAL ULTRASOUND  06/04/2021   Procedure: VIDEO BRONCHOSCOPY WITH ENDOBRONCHIAL ULTRASOUND;  Surgeon: Garner Nash, DO;  Location: [place];  Service: Pulmonary;;    Family History  Family history unknown: Yes    Social History Social History   Tobacco Use   Smoking status: Some Days    Packs/day: 0.50    Pack years: 0.00    Types: Cigarettes   Smokeless tobacco: Never  Vaping Use   Vaping Use: Never used  Substance Use Topics   Alcohol use: No   Drug use: Never    No Known Allergies  Current Outpatient Medications  Medication Sig Dispense Refill   aspirin 81 MG tablet Take 81 mg by mouth daily.     carvedilol (COREG) 25 MG tablet Take 25 mg by mouth at bedtime.     furosemide (LASIX) 20 MG tablet Take 20 mg by mouth at bedtime.     levothyroxine (SYNTHROID) 25 MCG tablet Take 25 mcg by mouth at bedtime.     Menthol-Methyl Salicylate (MUSCLE RUB) 10-15 % CREA Apply 1 application topically daily as needed for muscle pain.     Multiple Vitamins-Minerals (MULTIVITAMIN WITH MINERALS) tablet Take 1 tablet by mouth daily.     nitroGLYCERIN (NITROSTAT) 0.4 MG SL tablet Place 0.4 mg under the tongue every 5 (five) minutes as needed for chest pain.     omeprazole (PRILOSEC) 20 MG capsule Take 20 mg by mouth daily.     sacubitril-valsartan (ENTRESTO) 49-51 MG Take 0.5 tablets by mouth 2 (two) times daily.     No current facility-administered medications for this visit.    Review of Systems  Constitutional: positive for fatigue Eyes: negative Ears, nose, mouth, throat, and face: negative Respiratory: positive for cough Cardiovascular: negative Gastrointestinal: negative Genitourinary:positive for frequency Integument/breast: negative Hematologic/lymphatic: negative Musculoskeletal:negative Neurological: negative Behavioral/Psych: negative Endocrine: negative Allergic/Immunologic:  negative  Physical Exam  IWO:EHOZY, healthy, no distress, well nourished, and well developed SKIN: skin color, texture, turgor are normal, no rashes or significant lesions HEAD: Normocephalic, No masses, lesions, tenderness or abnormalities EYES: normal, PERRLA EARS: External ears normal, Canals clear OROPHARYNX:no exudate, no erythema, and lips, buccal mucosa, and tongue normal  NECK: supple, no adenopathy, no JVD LYMPH:  no palpable lymphadenopathy, no hepatosplenomegaly LUNGS: clear to auscultation , and palpation HEART: regular rate & rhythm, no murmurs, and no gallops ABDOMEN:abdomen soft, non-tender, normal bowel sounds, and no masses or organomegaly BACK: Back symmetric, no curvature., No CVA tenderness EXTREMITIES:no joint deformities, effusion, or inflammation, no edema  NEURO: alert & oriented x 3 with fluent speech, no focal motor/sensory deficits  PERFORMANCE STATUS: ECOG 1  LABORATORY DATA: Lab Results  Component Value Date   WBC 8.6 01/13/2019   HGB 12.4 (L) 01/13/2019   HCT 41.2 01/13/2019   MCV 98.6 01/13/2019   PLT 162 01/13/2019      Chemistry      Component Value Date/Time   NA 135 06/04/2021 1039   K 4.7 06/04/2021 1039   CL 102 06/04/2021 1039   CO2 25 06/04/2021 1039   BUN 17 06/04/2021 1039  CREATININE 1.50 (H) 06/04/2021 1039      Component Value Date/Time   CALCIUM 9.5 06/04/2021 1039   ALKPHOS 110 01/13/2019 1109   AST 26 01/13/2019 1109   ALT 18 01/13/2019 1109   BILITOT 1.2 01/13/2019 1109       RADIOGRAPHIC STUDIES: NM PET Image Initial (PI) Skull Base To Thigh  Result Date: 05/31/2021 CLINICAL DATA:  Initial treatment strategy for pulmonary nodule. EXAM: NUCLEAR MEDICINE PET SKULL BASE TO THIGH TECHNIQUE: 12.0 mCi F-18 FDG was injected intravenously. Full-ring PET imaging was performed from the skull base to thigh after the radiotracer. CT data was obtained and used for attenuation correction and anatomic localization. Fasting blood  glucose: 152 mg/dl COMPARISON:  05/30/2021 CT scan FINDINGS: Mediastinal blood pool activity: SUV max 3.1 Liver activity: SUV max NA NECK: No significant abnormal hypermetabolic activity in this region. Incidental CT findings: none CHEST: Persistent right lower lobe consolidation with some associated volume loss, maximum regional SUV 6.3. The distribution of metabolic activity is more striking along the periphery of this process Airspace opacity in the left lower lobe anteriorly on image 97 of series 4 measures 4.6 by 3.2 cm, with maximum SUV of 5.0. There is some adjacent ground-glass opacity centrally in the left lower lobe. 1.0 cm right lower paratracheal lymph node on image 68 series 4 with maximum SUV 3.5 Mildly accentuated distal esophageal activity with maximum SUV 4.4, likely physiologic. Focal anterior mediastinal activity along a marker site along the anterior aortic arch, probably artifactual, maximum SUV 5.4. Incidental CT findings: Cardiomegaly and prior CABG. Coronary, aortic arch, and branch vessel atherosclerotic vascular disease. Mild bilateral gynecomastia. ABDOMEN/PELVIS: No significant abnormal hypermetabolic activity in this region. Incidental CT findings: Aortoiliac atherosclerotic vascular disease. Left scrotal hydrocele. SKELETON: No significant abnormal hypermetabolic activity in this region. Incidental CT findings: Degenerative arthropathy of the hips, left greater than right. Bilateral mesoacromial os acromiale. IMPRESSION: 1. The progressively consolidated region in the right lower lobe has a maximum SUV of 6.3, and the enlarging area of consolidation anteriorly in the left lower lobe has a maximum SUV of 5.0. Possibilities include granulomatous consolidation or malignancy. 2. Faintly hypermetabolic borderline enlarged right lower paratracheal lymph node. 3. Other imaging findings of potential clinical significance: Aortic Atherosclerosis (ICD10-I70.0). Coronary atherosclerosis.  Cardiomegaly with prior CABG. Left scrotal hydrocele. Electronically Signed   By: Van Clines M.D.   On: 05/31/2021 10:01   DG CHEST PORT 1 VIEW  Result Date: 06/04/2021 CLINICAL DATA:  Status post bronchoscopy EXAM: PORTABLE CHEST 1 VIEW COMPARISON:  January 06, 2021 chest radiograph; chest CT May 30, 2021; PET-CT May 31, 2021 FINDINGS: No pneumothorax. There is persistent airspace consolidation in the right lower lobe medially. Lungs elsewhere are clear. There is cardiomegaly with pulmonary vascularity normal. Patient is status post coronary artery bypass grafting. No adenopathy. No bone lesions. IMPRESSION: Airspace opacity medial right base. No pneumothorax. Stable cardiomegaly. Status post coronary artery bypass grafting. Electronically Signed   By: Lowella Grip III M.D.   On: 06/04/2021 15:13   ECHOCARDIOGRAM COMPLETE  Result Date: 05/28/2021    ECHOCARDIOGRAM REPORT   Patient Name:   Paul [name] Date of Exam: 05/28/2021 Medical Rec #:  [id number]       Height:       68.0 in Accession #:    [id number]      Weight:       245.4 lb Date of Birth:  12-Jul-1946       BSA:  2.229 m Patient Age:    37 years        BP:           126/70 mmHg Patient Gender: M               HR:           67 bpm. Exam Location:  Outpatient Procedure: 2D Echo Indications:    Cardiomyopathy-Ischemic I25.5  History:        Patient has no prior history of Echocardiogram examinations.                 Risk Factors:Hypertension and Diabetes.  Sonographer:    Mikki Santee RDCS (AE) Referring Phys: [id number] [place]  1. Left ventricular ejection fraction, by estimation, is 30 to 35%. The left ventricle has moderately decreased function. The left ventricle has no regional wall motion abnormalities. Left ventricular diastolic parameters are consistent with Grade I diastolic dysfunction (impaired relaxation).  2. Right ventricular systolic function is normal. The right ventricular size is normal.  There is normal pulmonary artery systolic pressure.  3. Left atrial size was mildly dilated.  4. Right atrial size was mildly dilated.  5. The mitral valve is grossly normal. Trivial mitral valve regurgitation.  6. The aortic valve is grossly normal. Aortic valve regurgitation is not visualized. No aortic stenosis is present. FINDINGS  Left Ventricle: Left ventricular ejection fraction, by estimation, is 30 to 35%. The left ventricle has moderately decreased function. The left ventricle has no regional wall motion abnormalities. The left ventricular internal cavity size was normal in size. There is no left ventricular hypertrophy. Left ventricular diastolic parameters are consistent with Grade I diastolic dysfunction (impaired relaxation). Right Ventricle: The right ventricular size is normal. Right vetricular wall thickness was not well visualized. Right ventricular systolic function is normal. There is normal pulmonary artery systolic pressure. The tricuspid regurgitant velocity is 2.70 m/s, and with an assumed right atrial pressure of 3 mmHg, the estimated right ventricular systolic pressure is 38.7 mmHg. Left Atrium: Left atrial size was mildly dilated. Right Atrium: Right atrial size was mildly dilated. Pericardium: There is no evidence of pericardial effusion. Mitral Valve: The mitral valve is grossly normal. Trivial mitral valve regurgitation. Tricuspid Valve: The tricuspid valve is grossly normal. Tricuspid valve regurgitation is trivial. Aortic Valve: The aortic valve is grossly normal. Aortic valve regurgitation is not visualized. No aortic stenosis is present. Pulmonic Valve: The pulmonic valve was normal in structure. Pulmonic valve regurgitation is not visualized. Aorta: The aortic root and ascending aorta are structurally normal, with no evidence of dilitation. IAS/Shunts: The atrial septum is grossly normal.  LEFT VENTRICLE PLAX 2D LVIDd:         5.60 cm      Diastology LVIDs:         4.80 cm      LV  e' medial:    4.28 cm/s LV PW:         1.10 cm      LV E/e' medial:  13.2 LV IVS:        1.30 cm      LV e' lateral:   4.20 cm/s LVOT diam:     2.20 cm      LV E/e' lateral: 13.5 LV SV:         60 LV SV Index:   27 LVOT Area:     3.80 cm  LV Volumes (MOD) LV vol d, MOD A2C: 203.0 ml LV  vol d, MOD A4C: 172.0 ml LV vol s, MOD A2C: 115.0 ml LV vol s, MOD A4C: 131.0 ml LV SV MOD A2C:     88.0 ml LV SV MOD A4C:     172.0 ml LV SV MOD BP:      64.0 ml RIGHT VENTRICLE RV S prime:     6.23 cm/s TAPSE (M-mode): 0.9 cm LEFT ATRIUM             Index       RIGHT ATRIUM           Index LA diam:        4.30 cm 1.93 cm/m  RA Area:     18.50 cm LA Vol (A2C):   81.4 ml 36.51 ml/m RA Volume:   53.40 ml  23.95 ml/m LA Vol (A4C):   72.4 ml 32.48 ml/m LA Biplane Vol: 80.1 ml 35.93 ml/m  AORTIC VALVE LVOT Vmax:   79.30 cm/s LVOT Vmean:  48.000 cm/s LVOT VTI:    0.158 m  AORTA Ao Root diam: 3.50 cm Ao Asc diam:  3.70 cm MITRAL VALVE               TRICUSPID VALVE MV Area (PHT): 3.12 cm    TR Peak grad:   29.2 mmHg MV Decel Time: 243 msec    TR Vmax:        270.00 cm/s MV E velocity: 56.60 cm/s MV A velocity: 90.40 cm/s  SHUNTS MV E/A ratio:  0.63        Systemic VTI:  0.16 m                            Systemic Diam: 2.20 cm Mertie Moores MD Electronically signed by Mertie Moores MD Signature Date/Time: 05/28/2021/6:19:37 PM    Final    CT Super D Chest Wo Contrast  Result Date: 05/30/2021 CLINICAL DATA:  Pre bronchoscopy. EXAM: CT CHEST WITHOUT CONTRAST TECHNIQUE: Multidetector CT imaging of the chest was performed using thin slice collimation for electromagnetic bronchoscopy planning purposes, without intravenous contrast. COMPARISON:  None. FINDINGS: Cardiovascular: Post CABG Mediastinum/Nodes: No axillary supraclavicular adenopathy no mediastinal hilar adenopathy. RIGHT lower paratracheal lymph node is upper limits of normal at 10 mm. Lungs/Pleura: Consolidation in the medial RIGHT lower lobe with air bronchograms. The  consolidative mass/process measures 7.5 x 5.1 cm. Mild focus consolidation in the anterior aspect of the LEFT lower lobe on image 118/4). Upper lungs are clear. Upper Abdomen: Limited view of the liver, kidneys, pancreas are unremarkable. Normal adrenal glands. Musculoskeletal: No aggressive osseous lesion IMPRESSION: 1. Rounded consolidation in the medial RIGHT lower lobe with air bronchograms is suggestive of pulmonary infection. Cannot exclude neoplasm. 2. No evidence of mediastinal lymphadenopathy. Borderline enlarged RIGHT lower paratracheal node. 3. Post CABG Electronically Signed   By: Suzy Bouchard M.D.   On: 05/30/2021 15:28   DG C-ARM BRONCHOSCOPY  Result Date: 06/04/2021 C-ARM BRONCHOSCOPY: Fluoroscopy was utilized by the requesting physician.  No radiographic interpretation.    ASSESSMENT: This is a very pleasant 75 years old African-American male recently diagnosed with stage IIIb (T3b, N2, M0) non-small cell lung cancer, adenocarcinoma presented with large right lower lobe lung mass and suspicious right lower paratracheal lymphadenopathy diagnosed in June 2022.   PLAN: I had a lengthy discussion with the patient today about his current disease stage, prognosis and treatment options. I personally and independently reviewed the scan images and discussed the result  and showed the images to the patient today. I recommended for the patient to complete the staging work-up by ordering MRI of the brain to rule out brain metastasis. I will also send his tissue block for molecular studies by [facility]. The patient is still physically active and he may be a candidate for surgical resection after a course of neoadjuvant chemotherapy and immunotherapy according to the Checkmate 816 trial.  I discussed with the patient this option and he is interested in proceeding with the neoadjuvant treatment.  He will be treated with carboplatin for AUC of 5, Alimta 500 Mg/M2 and nivolumab 360 mg IV every 3 weeks for  3 cycles followed by imaging studies and evaluation for surgical resection. If the patient is not a surgical candidate after the neoadjuvant chemotherapy and immunotherapy, we will consider him for a course of concurrent chemoradiation. I discussed with the patient the adverse effect of the chemotherapy including but not limited to mild alopecia, myelosuppression, nausea and vomiting, peripheral neuropathy, liver or renal dysfunction as well as immunotherapy adverse effects. He is expected to start the first cycle of this treatment next week. The patient will receive vitamin B12 injection today. He will have a chemotherapy education class before the first dose of his treatment. I will call his pharmacy with prescription for folic acid 1 mg p.o. daily in addition to Compazine 10 mg p.o. every 6 hours as needed for nausea. The patient will come back for follow-up visit in 2 weeks for evaluation and management of any adverse effect of his treatment. He was advised to call immediately if he has any other concerning symptoms in the interval. The total time spent in the appointment was 80 minutes. Disclaimer: This note was dictated with voice recognition software. Similar sounding words can inadvertently be transcribed and may be missed upon review. Eilleen Kempf, MD 06/17/21   The patient voices understanding of current disease status and treatment options and is in agreement with the current care plan.  All questions were answered. The patient knows to call the clinic with any problems, questions or concerns. We can certainly see the patient much sooner if necessary.  Thank you so much for allowing me to participate in the care of [facility]. I will continue to follow up the patient with you and assist in his care. The total time spent in the appointment was 90 minutes.  Disclaimer: This note was dictated with voice recognition software. Similar sounding words can inadvertently be  transcribed and may not be corrected upon review.   Eilleen Kempf June 17, 2021, 2:34 PM

## 2021-06-17 NOTE — Progress Notes (Signed)
START OFF PATHWAY REGIMEN - Other   [id number]:Carboplatin AUC=5 + Pemetrexed 500 mg/m2 q21 Days:   A cycle is every 21 days:     Pemetrexed      Carboplatin   **Always confirm dose/schedule in your pharmacy ordering system**  Patient Characteristics: Intent of Therapy: Curative Intent, Discussed with Patient

## 2021-06-17 NOTE — Progress Notes (Signed)
Oncology Nurse Navigator Documentation  Oncology Nurse Navigator Flowsheets 06/17/2021  Abnormal Finding Date 01/06/2021  Confirmed Diagnosis Date 06/04/2021  Diagnosis Status Pending Molecular Studies  Planned Course of Treatment Chemotherapy;Targeted Therapy  Phase of Treatment Targeted Therapy  Navigator Follow Up Date: 06/20/2021  Navigator Follow Up Reason: Appointment Review  Navigator Location [facility]  Referral Date to RadOnc/MedOnc 06/17/2021  Navigator Encounter Type Initial MedOnc/spoke to Paul [name] today at his first visit with Dr. Julien Nordmann.  Patient's treatment plan is neo adjuvant chemo and IO therapy. I gave him information on Lung cancer, treatment plan, and resources here at the cancer center.  Per Dr. Julien Nordmann he would like me to contact [facility] for molecular testing on [name] recent bx.  I will complete.   Patient Visit Type Initial;MedOnc  Treatment Phase Pre-Tx/Tx Discussion  Barriers/Navigation Needs Education  Education Newly Diagnosed Cancer Education;Other  Interventions Education;Psycho-Social Support  Acuity Level 2-Minimal Needs (1-2 Barriers Identified)  Education Method Verbal;Written  Time Spent with Patient 62

## 2021-06-21 ENCOUNTER — Telehealth: Payer: Self-pay | Admitting: Emergency Medicine

## 2021-06-21 NOTE — Telephone Encounter (Signed)
[id number] - Customer service manager for the Discovery and Validation of Biomarkers for the Prediction, Diagnosis, and Management of Disease  1:16pm: Called to check interest in participating in this research study.  Patient did not answer, left voicemail requesting return call.  Clabe Seal Clinical Research Coordinator I  06/21/21  1:16 PM

## 2021-06-22 ENCOUNTER — Inpatient Hospital Stay (HOSPITAL_COMMUNITY)
Admission: EM | Admit: 2021-06-22 | Discharge: 2021-06-24 | DRG: 638 | Disposition: A | Discharge status: Home / Self Care | Payer: No Typology Code available for payment source | Attending: Family Medicine | Admitting: Family Medicine

## 2021-06-22 ENCOUNTER — Encounter: Payer: Self-pay | Admitting: Internal Medicine

## 2021-06-22 ENCOUNTER — Emergency Department (HOSPITAL_COMMUNITY): Payer: No Typology Code available for payment source

## 2021-06-22 ENCOUNTER — Other Ambulatory Visit: Payer: Self-pay

## 2021-06-22 DIAGNOSIS — E860 Dehydration: Secondary | ICD-10-CM | POA: Diagnosis present

## 2021-06-22 DIAGNOSIS — E785 Hyperlipidemia, unspecified: Secondary | ICD-10-CM | POA: Diagnosis present

## 2021-06-22 DIAGNOSIS — I2510 Atherosclerotic heart disease of native coronary artery without angina pectoris: Secondary | ICD-10-CM | POA: Diagnosis present

## 2021-06-22 DIAGNOSIS — Z951 Presence of aortocoronary bypass graft: Secondary | ICD-10-CM

## 2021-06-22 DIAGNOSIS — N183 Chronic kidney disease, stage 3 unspecified: Secondary | ICD-10-CM | POA: Diagnosis present

## 2021-06-22 DIAGNOSIS — E1165 Type 2 diabetes mellitus with hyperglycemia: Secondary | ICD-10-CM | POA: Diagnosis present

## 2021-06-22 DIAGNOSIS — I071 Rheumatic tricuspid insufficiency: Secondary | ICD-10-CM | POA: Diagnosis present

## 2021-06-22 DIAGNOSIS — F17210 Nicotine dependence, cigarettes, uncomplicated: Secondary | ICD-10-CM | POA: Diagnosis present

## 2021-06-22 DIAGNOSIS — Z79890 Hormone replacement therapy (postmenopausal): Secondary | ICD-10-CM

## 2021-06-22 DIAGNOSIS — E039 Hypothyroidism, unspecified: Secondary | ICD-10-CM | POA: Diagnosis present

## 2021-06-22 DIAGNOSIS — N1831 Chronic kidney disease, stage 3a: Secondary | ICD-10-CM | POA: Diagnosis present

## 2021-06-22 DIAGNOSIS — Z7982 Long term (current) use of aspirin: Secondary | ICD-10-CM

## 2021-06-22 DIAGNOSIS — E1122 Type 2 diabetes mellitus with diabetic chronic kidney disease: Secondary | ICD-10-CM | POA: Diagnosis present

## 2021-06-22 DIAGNOSIS — E875 Hyperkalemia: Secondary | ICD-10-CM | POA: Diagnosis not present

## 2021-06-22 DIAGNOSIS — I130 Hypertensive heart and chronic kidney disease with heart failure and stage 1 through stage 4 chronic kidney disease, or unspecified chronic kidney disease: Secondary | ICD-10-CM | POA: Diagnosis present

## 2021-06-22 DIAGNOSIS — C3491 Malignant neoplasm of unspecified part of right bronchus or lung: Secondary | ICD-10-CM | POA: Diagnosis present

## 2021-06-22 DIAGNOSIS — I5020 Unspecified systolic (congestive) heart failure: Secondary | ICD-10-CM | POA: Diagnosis present

## 2021-06-22 DIAGNOSIS — I10 Essential (primary) hypertension: Secondary | ICD-10-CM | POA: Diagnosis not present

## 2021-06-22 DIAGNOSIS — Z794 Long term (current) use of insulin: Secondary | ICD-10-CM

## 2021-06-22 DIAGNOSIS — G4733 Obstructive sleep apnea (adult) (pediatric): Secondary | ICD-10-CM | POA: Diagnosis present

## 2021-06-22 DIAGNOSIS — E1100 Type 2 diabetes mellitus with hyperosmolarity without nonketotic hyperglycemic-hyperosmolar coma (NKHHC): Secondary | ICD-10-CM | POA: Diagnosis not present

## 2021-06-22 DIAGNOSIS — R739 Hyperglycemia, unspecified: Secondary | ICD-10-CM

## 2021-06-22 DIAGNOSIS — K219 Gastro-esophageal reflux disease without esophagitis: Secondary | ICD-10-CM | POA: Diagnosis present

## 2021-06-22 LAB — URINALYSIS, ROUTINE W REFLEX MICROSCOPIC
Bacteria, UA: NONE SEEN
Bilirubin Urine: NEGATIVE
Glucose, UA: >=500 mg/dL — AB
Ketones, ur: NEGATIVE mg/dL
Leukocytes,Ua: NEGATIVE
Nitrite: NEGATIVE
Protein, ur: NEGATIVE mg/dL
Specific Gravity, Urine: 1.026 (ref 1.005–1.030)
pH: 5 (ref 5.0–8.0)

## 2021-06-22 LAB — CBC
HCT: 48.8 % (ref 39.0–52.0)
Hemoglobin: 15.8 g/dL (ref 13.0–17.0)
MCH: 31.3 pg (ref 26.0–34.0)
MCHC: 32.4 g/dL (ref 30.0–36.0)
MCV: 96.8 fL (ref 80.0–100.0)
Platelets: 232 10*3/uL (ref 150–400)
RBC: 5.04 MIL/uL (ref 4.22–5.81)
RDW: 13.2 % (ref 11.5–15.5)
WBC: 9.9 10*3/uL (ref 4.0–10.5)
nRBC: 0 % (ref 0.0–0.2)

## 2021-06-22 LAB — BASIC METABOLIC PANEL
Anion gap: 11 (ref 5–15)
BUN: 27 mg/dL — ABNORMAL HIGH (ref 8–23)
CO2: 24 mmol/L (ref 22–32)
Calcium: 9.8 mg/dL (ref 8.9–10.3)
Chloride: 95 mmol/L — ABNORMAL LOW (ref 98–111)
Creatinine, Ser: 2.27 mg/dL — ABNORMAL HIGH (ref 0.61–1.24)
GFR, Estimated: 29 mL/min — ABNORMAL LOW (ref >60)
Glucose, Bld: 1264 mg/dL (ref 70–99)
Potassium: 5.5 mmol/L — ABNORMAL HIGH (ref 3.5–5.1)
Sodium: 130 mmol/L — ABNORMAL LOW (ref 135–145)

## 2021-06-22 LAB — HEPATIC FUNCTION PANEL
ALT: 25 U/L (ref 0–44)
AST: 28 U/L (ref 15–41)
Albumin: 4.2 g/dL (ref 3.5–5.0)
Alkaline Phosphatase: 223 U/L — ABNORMAL HIGH (ref 38–126)
Bilirubin, Direct: 0.3 mg/dL — ABNORMAL HIGH (ref 0.0–0.2)
Indirect Bilirubin: 0.8 mg/dL (ref 0.3–0.9)
Total Bilirubin: 1.1 mg/dL (ref 0.3–1.2)
Total Protein: 7.7 g/dL (ref 6.5–8.1)

## 2021-06-22 LAB — CBG MONITORING, ED
Glucose-Capillary: 456 mg/dL — ABNORMAL HIGH (ref 70–99)
Glucose-Capillary: 473 mg/dL — ABNORMAL HIGH (ref 70–99)
Glucose-Capillary: >600 mg/dL (ref 70–99)
Glucose-Capillary: >600 mg/dL (ref 70–99)
Glucose-Capillary: >600 mg/dL (ref 70–99)
Glucose-Capillary: >600 mg/dL (ref 70–99)
Glucose-Capillary: >600 mg/dL (ref 70–99)
Glucose-Capillary: >600 mg/dL (ref 70–99)
Glucose-Capillary: >600 mg/dL (ref 70–99)
Glucose-Capillary: >600 mg/dL (ref 70–99)
Glucose-Capillary: >600 mg/dL (ref 70–99)

## 2021-06-22 MED ORDER — LACTATED RINGERS IV SOLN: INTRAVENOUS | Status: DC

## 2021-06-22 MED ORDER — INSULIN REGULAR(HUMAN) IN NACL 100-0.9 UT/100ML-% IV SOLN: INTRAVENOUS | Status: DC

## 2021-06-22 MED ORDER — DEXTROSE 50 % IV SOLN: 0.0000 mL | INTRAVENOUS | Status: DC | PRN

## 2021-06-22 MED ORDER — LACTATED RINGERS IV BOLUS
20.0000 mL/kg | Freq: Once | INTRAVENOUS | Status: AC
  Administered 2021-06-22: 2104 mL via INTRAVENOUS

## 2021-06-22 MED ORDER — INSULIN REGULAR(HUMAN) IN NACL 100-0.9 UT/100ML-% IV SOLN
INTRAVENOUS | Status: DC
  Administered 2021-06-22: 11.5 [IU]/h via INTRAVENOUS
  Filled 2021-06-22 (×2): qty 100

## 2021-06-22 MED ORDER — DEXTROSE IN LACTATED RINGERS 5 % IV SOLN: INTRAVENOUS | Status: DC

## 2021-06-22 MED ORDER — SODIUM CHLORIDE 0.9 % IV BOLUS
1000.0000 mL | Freq: Once | INTRAVENOUS | Status: AC
  Administered 2021-06-22: 1000 mL via INTRAVENOUS

## 2021-06-22 MED ORDER — ENOXAPARIN SODIUM 40 MG/0.4ML IJ SOSY
40.0000 mg | PREFILLED_SYRINGE | INTRAMUSCULAR | Status: DC
  Administered 2021-06-22: 40 mg via SUBCUTANEOUS
  Filled 2021-06-22: qty 0.4

## 2021-06-22 NOTE — ED Notes (Signed)
Per Endotool, continue at 28.5units/hr. Recheck in 30 minutes

## 2021-06-22 NOTE — ED Notes (Signed)
Per Endotool, continue at 17.5units/hr. Recheck in 30 minutes

## 2021-06-22 NOTE — ED Notes (Addendum)
Per Endotool, continue insulin at 11.5 units/hour. Recheck at 2033. Pt given water per Jonelle Sidle, MD approval

## 2021-06-22 NOTE — ED Notes (Signed)
Per Endotool, continue at 35.5units/hr

## 2021-06-22 NOTE — H&P (Signed)
History and Physical   Paul [name] BOF:[id number] DOB: [birth year] DOA: 06/22/2021  Referring MD/NP/PA: Dr. Roderic Palau  PCP: [name]   Outpatient Specialists: None but goes to the [place]  Patient coming from: Home  Chief Complaint: High blood sugar  HPI: Paul [name] is a 75 y.o. male with medical history significant of non-insulin-dependent diabetes, coronary artery disease, hypertension, hypothyroidism, ischemic cardiomyopathy, lung cancer being managed at the [place], obstructive sleep apnea who presented to the ER due to elevated blood sugar.  Patient was found to have blood sugar of almost 1200.  Insist that he has been taking his medications.  He is very thirsty.  Worried but no DKA.  Has not been on steroids.  Patient initiated on insulin drip, IV fluids in the ER and being admitted to the medical service..  ED Course: Temperature is 97.7 blood pressure 171/148, pulse 106 respiratory of 31 and oxygen sat 84% on room air admission.  Sodium 130 potassium 5.5 chloride 95 CO2 24 glucose 1264 creatinine 2.27 BUN 27.  LFTs within normal CBC within normal urinalysis showed glucosuria otherwise no significant findings.  Chest x-ray read showed no acute findings.  Patient being admitted for management of hyperosmolar hyperglycemia  Review of Systems: As per HPI otherwise 10 point review of systems negative.    Past Medical History:  Diagnosis Date   CHF (congestive heart failure) ([place])    Coronary artery disease    Diabetes mellitus without complication ([place])    Hypertension    Hypothyroidism    Ischemic cardiomyopathy    Pneumonia    a long time ago   Sleep apnea    no longer uses a cpap    Past Surgical History:  Procedure Laterality Date   BRONCHIAL BIOPSY  06/04/2021   Procedure: BRONCHIAL BIOPSIES;  Surgeon: Garner Nash, DO;  Location: [facility];  Service: Pulmonary;;   BRONCHIAL BRUSHINGS  06/04/2021   Procedure: BRONCHIAL BRUSHINGS;  Surgeon: Garner Nash,  DO;  Location: [facility];  Service: Pulmonary;;   BRONCHIAL NEEDLE ASPIRATION BIOPSY  06/04/2021   Procedure: BRONCHIAL NEEDLE ASPIRATION BIOPSIES;  Surgeon: Garner Nash, DO;  Location: [facility];  Service: Pulmonary;;   BRONCHIAL WASHINGS  06/04/2021   Procedure: BRONCHIAL WASHINGS;  Surgeon: Garner Nash, DO;  Location: [facility];  Service: Pulmonary;;   CARDIAC SURGERY     COLONOSCOPY     CORONARY ARTERY BYPASS GRAFT     2019 at [facility] Right 06/04/2021   Procedure: [place];  Surgeon: Garner Nash, DO;  Location: [facility];  Service: Pulmonary;  Laterality: Right;   VIDEO BRONCHOSCOPY WITH ENDOBRONCHIAL ULTRASOUND  06/04/2021   Procedure: VIDEO BRONCHOSCOPY WITH ENDOBRONCHIAL ULTRASOUND;  Surgeon: Garner Nash, DO;  Location: [facility];  Service: Pulmonary;;     reports that he has been smoking cigarettes. He has been smoking an average of 0.50 packs per day. He has never used smokeless tobacco. He reports that he does not drink alcohol and does not use drugs.  No Known Allergies  Family History  Family history unknown: Yes     Prior to Admission medications   Medication Sig Start Date End Date Taking? Authorizing Provider  aspirin 81 MG tablet Take 81 mg by mouth daily.    [provider]  carvedilol (COREG) 25 MG tablet Take 25 mg by mouth at bedtime. 04/14/18   [provider]  folic acid (FOLVITE) 1  MG tablet Take 1 tablet (1 mg total) by mouth daily. 06/17/21   Curt Bears, MD  furosemide (LASIX) 20 MG tablet Take 20 mg by mouth at bedtime. Patient not taking: Reported on 06/17/2021    [provider]  levothyroxine (SYNTHROID) 25 MCG tablet Take 25 mcg by mouth at bedtime.    [provider]  Menthol-Methyl Salicylate (MUSCLE RUB) 10-15 % CREA Apply 1 application topically daily as needed for muscle pain.    [provider]  Multiple Vitamins-Minerals (MULTIVITAMIN WITH MINERALS) tablet Take 1 tablet by mouth daily.    [provider]  nitroGLYCERIN (NITROSTAT) 0.4 MG SL tablet Place 0.4 mg under the tongue every 5 (five) minutes as needed for chest pain. 09/06/18   [provider]  omeprazole (PRILOSEC) 20 MG capsule Take 20 mg by mouth daily.    [provider]  prochlorperazine (COMPAZINE) 10 MG tablet Take 1 tablet (10 mg total) by mouth every 6 (six) hours as needed for nausea or vomiting. 06/17/21   Curt Bears, MD  sacubitril-valsartan (ENTRESTO) 49-51 MG Take 0.5 tablets by mouth 2 (two) times daily.    [provider]    Physical Exam: Vitals:   06/22/21 2000 06/22/21 2030 06/22/21 2100 06/22/21 2115  BP: (!) 149/109 138/85 (!) 171/148   Pulse: 89  (!) 106 89  Resp:   16   Temp:      TempSrc:      SpO2: 96%  97% (!) 84%  Weight:      Height:          Constitutional: Obese, anxious, no distress Vitals:   06/22/21 2000 06/22/21 2030 06/22/21 2100 06/22/21 2115  BP: (!) 149/109 138/85 (!) 171/148   Pulse: 89  (!) 106 89  Resp:   16   Temp:      TempSrc:      SpO2: 96%  97% (!) 84%  Weight:      Height:       Eyes: PERRL, lids and conjunctivae normal ENMT: Mucous membranes are dry. Posterior pharynx clear of any exudate or lesions.Normal dentition.  Neck: normal, supple, no masses, no thyromegaly Respiratory: clear to auscultation bilaterally, no wheezing, no crackles. Normal respiratory effort. No accessory muscle use.  Cardiovascular: Sinus tachycardia, no murmurs / rubs / gallops. No extremity edema. 2+ pedal pulses. No carotid bruits.  Abdomen: no tenderness, no masses palpated. No hepatosplenomegaly. Bowel sounds positive.  Musculoskeletal: no clubbing / cyanosis. No joint deformity upper and lower extremities. Good ROM, no contractures. Normal muscle tone.  Skin: no rashes, lesions, ulcers. No induration Neurologic: CN 2-12  grossly intact. Sensation intact, DTR normal. Strength 5/5 in all 4.  Psychiatric: Normal judgment and insight. Alert and oriented x 3.  Anxious mood.     Labs on Admission: I have personally reviewed following labs and imaging studies  CBC: Recent Labs  Lab 06/22/21 1604  WBC 9.9  HGB 15.8  HCT 48.8  MCV 96.8  PLT 101   Basic Metabolic Panel: Recent Labs  Lab 06/22/21 1604  NA 130*  K 5.5*  CL 95*  CO2 24  GLUCOSE 1,264*  BUN 27*  CREATININE 2.27*  CALCIUM 9.8   GFR: Estimated Creatinine Clearance: 32.5 mL/min (A) (by C-G formula based on SCr of 2.27 mg/dL (H)). Liver Function Tests: Recent Labs  Lab 06/22/21 1604  AST 28  ALT 25  ALKPHOS 223*  BILITOT 1.1  PROT 7.7  ALBUMIN 4.2   No results  for input(s): LIPASE, AMYLASE in the last 168 hours. No results for input(s): AMMONIA in the last 168 hours. Coagulation Profile: No results for input(s): INR, PROTIME in the last 168 hours. Cardiac Enzymes: No results for input(s): CKTOTAL, CKMB, CKMBINDEX, TROPONINI in the last 168 hours. BNP (last 3 results) No results for input(s): PROBNP in the last 8760 hours. HbA1C: No results for input(s): HGBA1C in the last 72 hours. CBG: Recent Labs  Lab 06/22/21 2002 06/22/21 2032 06/22/21 2103 06/22/21 2129 06/22/21 2216  GLUCAP >600* >600* >600* >600* >600*   Lipid Profile: No results for input(s): CHOL, HDL, LDLCALC, TRIG, CHOLHDL, LDLDIRECT in the last 72 hours. Thyroid Function Tests: No results for input(s): TSH, T4TOTAL, FREET4, T3FREE, THYROIDAB in the last 72 hours. Anemia Panel: No results for input(s): VITAMINB12, FOLATE, FERRITIN, TIBC, IRON, RETICCTPCT in the last 72 hours. Urine analysis:    Component Value Date/Time   COLORURINE STRAW (A) 06/22/2021 1604   APPEARANCEUR CLEAR 06/22/2021 1604   LABSPEC 1.026 06/22/2021 1604   PHURINE 5.0 06/22/2021 1604   GLUCOSEU >=500 (A) 06/22/2021 1604   HGBUR SMALL (A) 06/22/2021 1604   BILIRUBINUR  NEGATIVE 06/22/2021 1604   KETONESUR NEGATIVE 06/22/2021 1604   PROTEINUR NEGATIVE 06/22/2021 1604   NITRITE NEGATIVE 06/22/2021 1604   LEUKOCYTESUR NEGATIVE 06/22/2021 1604   Sepsis Labs: @LABRCNTIP (procalcitonin:4,lacticidven:4) )No results found for this or any previous visit (from the past 240 hour(s)).   Radiological Exams on Admission: DG Chest 2 View  Result Date: 06/22/2021 CLINICAL DATA:  Polyuria and weakness x1 week EXAM: CHEST - 2 VIEW COMPARISON:  June 04, 2021 FINDINGS: Multiple sternal wires and vascular clips are noted. Mild, stable atelectasis and/or infiltrate is seen along the bilateral infrahilar regions. There is no evidence of a pleural effusion or pneumothorax. The cardiac silhouette is mildly enlarged and unchanged in size. The visualized skeletal structures are unremarkable. IMPRESSION: Stable cardiomegaly and stable areas of bilateral infrahilar atelectasis and/or infiltrate. Electronically Signed   By: Virgina Norfolk M.D.   On: 06/22/2021 17:26      Assessment/Plan Principal Problem:   Hyperglycemia due to diabetes mellitus (HCC) Active Problems:   Adenocarcinoma of right lung, stage 3 (HCC)   GERD (gastroesophageal reflux disease)   Essential hypertension   Hyperlipidemia   CKD (chronic kidney disease) stage 3, GFR 30-59 ml/min (HCC)   CAD, multiple vessel     #1 hyperosmolar hyperglycemia: Patient will be admitted and placed on protocol for IV insulin.  Not in DKA.  Hydration and frequent blood glucose checks.  Patient will be transition to subcu insulin with oral agents most likely to go home at time of discharge.  Patient has not taken his diabetic medications in a long time.  #2 GERD: Patient takes omeprazole at home.  We will continue.  #3 systolic dysfunction CHF: EF is 30 to 35%.  Patient currently intravascularly dehydrated.  We will be careful about fluid resuscitation to avoid fluid overload.  We will hold Lasix.  Continue Entresto,  Carvedilol.  #4 adenocarcinoma of the lung: Treatment now in the [place].  Defer to [place].  #5 coronary artery disease: Monitor on telemetry [place].  Continue his home medications  #6 hypothyroidism: Continue levothyroxine.  #7 chronic kidney disease stage III: Continue monitoring.  #8 hyperlipidemia: Not on statin at home.   DVT prophylaxis: Lovenox Code Status: Full code Family Communication: No family at bedside Disposition Plan: Home Consults called: None Admission status: Inpatient  Severity of Illness: The appropriate patient status for  this patient is INPATIENT. Inpatient status is judged to be reasonable and necessary in order to provide the required intensity of service to ensure the patient's safety. The patient's presenting symptoms, physical exam findings, and initial radiographic and laboratory data in the context of their chronic comorbidities is felt to place them at high risk for further clinical deterioration. Furthermore, it is not anticipated that the patient will be medically stable for discharge from the hospital within 2 midnights of admission. The following factors support the patient status of inpatient.   " The patient's presenting symptoms include hyperglycemia. " The worrisome physical exam findings include dry mucous membranes. " The initial radiographic and laboratory data are worrisome because of blood sugar more than 1200. " The chronic co-morbidities include diabetes.   * I certify that at the point of admission it is my clinical judgment that the patient will require inpatient hospital care spanning beyond 2 midnights from the point of admission due to high intensity of service, high risk for further deterioration and high frequency of surveillance required.Barbette Merino MD [facility] Pager [phone]  If 7PM-7AM, please contact night-coverage [email or web address] Password [facility]  06/22/2021, 11:05 PM

## 2021-06-22 NOTE — ED Triage Notes (Signed)
Pt BIBA from home-  EMS reports pt had CABG last year, metformin d/c at that time, pt not currently taking any diabetes medications. Pt c/o polyuria x1 week, polydipsia, malaise x3 days. Pt reports difficulty in getting BP meds, has been without them for unknown amount of time.

## 2021-06-22 NOTE — ED Notes (Signed)
Per Endotool, continue at 36.5units/hr. Recheck in 30 minutes

## 2021-06-22 NOTE — ED Provider Notes (Signed)
[facility] DEPT Provider Note   CSN: [id number] Arrival date & time: 06/22/21  1539     History Chief Complaint  Patient presents with   Hyperglycemia    Paul [name] is a 75 y.o. male.  Patient states that he has been urinating a lot lately.  And he feels weak.  He has a history of diabetes but not taking anything.  Also recently diagnosed with lung cancer  The history is provided by the patient and medical records. No language interpreter was used.  Hyperglycemia Severity:  Moderate Onset quality:  Sudden Duration: Unknown. Timing:  Constant Progression:  Worsening Chronicity:  Recurrent Context: not change in medication   Relieved by:  Nothing Ineffective treatments:  None tried Associated symptoms: fatigue   Associated symptoms: no abdominal pain and no chest pain   Risk factors: no hx of DKA       Past Medical History:  Diagnosis Date   CHF (congestive heart failure) (HCC)    Coronary artery disease    Diabetes mellitus without complication ([place])    Hypertension    Hypothyroidism    Ischemic cardiomyopathy    Pneumonia    a long time ago   Sleep apnea    no longer uses a cpap    Patient Active Problem List   Diagnosis Date Noted   Hyperglycemia due to diabetes mellitus ([place]) 06/22/2021   Adenocarcinoma of right lung, stage 3 ([place]) 06/17/2021   Encounter for antineoplastic chemotherapy 06/17/2021   Encounter for antineoplastic immunotherapy 06/17/2021   Lung nodule 05/17/2021    Past Surgical History:  Procedure Laterality Date   BRONCHIAL BIOPSY  06/04/2021   Procedure: BRONCHIAL BIOPSIES;  Surgeon: Garner Nash, DO;  Location: [facility];  Service: Pulmonary;;   BRONCHIAL BRUSHINGS  06/04/2021   Procedure: BRONCHIAL BRUSHINGS;  Surgeon: Garner Nash, DO;  Location: [facility];  Service: Pulmonary;;   BRONCHIAL NEEDLE ASPIRATION BIOPSY  06/04/2021   Procedure: BRONCHIAL NEEDLE ASPIRATION BIOPSIES;  Surgeon:  Garner Nash, DO;  Location: [facility];  Service: Pulmonary;;   BRONCHIAL WASHINGS  06/04/2021   Procedure: BRONCHIAL WASHINGS;  Surgeon: Garner Nash, DO;  Location: [facility];  Service: Pulmonary;;   CARDIAC SURGERY     COLONOSCOPY     CORONARY ARTERY BYPASS GRAFT     2019 at [place] Right 06/04/2021   Procedure: [place];  Surgeon: Garner Nash, DO;  Location: [facility];  Service: Pulmonary;  Laterality: Right;   VIDEO BRONCHOSCOPY WITH ENDOBRONCHIAL ULTRASOUND  06/04/2021   Procedure: VIDEO BRONCHOSCOPY WITH ENDOBRONCHIAL ULTRASOUND;  Surgeon: Garner Nash, DO;  Location: [facility];  Service: Pulmonary;;       Family History  Family history unknown: Yes    Social History   Tobacco Use   Smoking status: Some Days    Packs/day: 0.50    Pack years: 0.00    Types: Cigarettes   Smokeless tobacco: Never  Vaping Use   Vaping Use: Never used  Substance Use Topics   Alcohol use: No   Drug use: Never    Home Medications Prior to Admission medications   Medication Sig Start Date End Date Taking? Authorizing Provider  aspirin 81 MG tablet Take 81 mg by mouth daily.    [provider]  carvedilol (COREG) 25 MG tablet Take 25 mg by mouth at bedtime. 04/14/18   [provider]  folic acid (FOLVITE)  1 MG tablet Take 1 tablet (1 mg total) by mouth daily. 06/17/21   Curt Bears, MD  furosemide (LASIX) 20 MG tablet Take 20 mg by mouth at bedtime. Patient not taking: Reported on 06/17/2021    [provider]  levothyroxine (SYNTHROID) 25 MCG tablet Take 25 mcg by mouth at bedtime.    [provider]  Menthol-Methyl Salicylate (MUSCLE RUB) 10-15 % CREA Apply 1 application topically daily as needed for muscle pain.    [provider]  Multiple Vitamins-Minerals (MULTIVITAMIN WITH MINERALS) tablet Take 1 tablet by mouth daily.     [provider]  nitroGLYCERIN (NITROSTAT) 0.4 MG SL tablet Place 0.4 mg under the tongue every 5 (five) minutes as needed for chest pain. 09/06/18   [provider]  omeprazole (PRILOSEC) 20 MG capsule Take 20 mg by mouth daily.    [provider]  prochlorperazine (COMPAZINE) 10 MG tablet Take 1 tablet (10 mg total) by mouth every 6 (six) hours as needed for nausea or vomiting. 06/17/21   Curt Bears, MD  sacubitril-valsartan (ENTRESTO) 49-51 MG Take 0.5 tablets by mouth 2 (two) times daily.    [provider]    Allergies    Patient has no known allergies.  Review of Systems   Review of Systems  Constitutional:  Positive for fatigue. Negative for appetite change.  HENT:  Negative for congestion, ear discharge and sinus pressure.   Eyes:  Negative for discharge.  Respiratory:  Negative for cough.   Cardiovascular:  Negative for chest pain.  Gastrointestinal:  Negative for abdominal pain and diarrhea.  Genitourinary:  Negative for frequency and hematuria.       Frequent urination  Musculoskeletal:  Negative for back pain.  Skin:  Negative for rash.  Neurological:  Negative for seizures and headaches.  Psychiatric/Behavioral:  Negative for hallucinations.    Physical Exam Updated Vital Signs BP 139/84   Pulse 80   Temp 97.7 F (36.5 C) (Oral)   Resp 16   Ht 5\' 8"  (1.727 m)   Wt 105.2 kg   SpO2 93%   BMI 35.28 kg/m   Physical Exam Vitals and nursing note reviewed.  Constitutional:      Appearance: He is well-developed.  HENT:     Head: Normocephalic.     Nose: Nose normal.     Mouth/Throat:     Mouth: Mucous membranes are moist.  Eyes:     General: No scleral icterus.    Conjunctiva/sclera: Conjunctivae normal.  Neck:     Thyroid: No thyromegaly.  Cardiovascular:     Rate and Rhythm: Normal rate and regular rhythm.     Heart sounds: No murmur heard.   No friction rub. No gallop.  Pulmonary:     Breath sounds: No stridor.  No wheezing or rales.  Chest:     Chest wall: No tenderness.  Abdominal:     General: There is no distension.     Tenderness: There is no abdominal tenderness. There is no rebound.  Musculoskeletal:        General: Normal range of motion.     Cervical back: Neck supple.  Lymphadenopathy:     Cervical: No cervical adenopathy.  Skin:    Findings: No erythema or rash.  Neurological:     Mental Status: He is oriented to person, place, and time.     Motor: No abnormal muscle tone.     Coordination: Coordination normal.  Psychiatric:  Behavior: Behavior normal.    ED Results / Procedures / Treatments   Labs (all labs ordered are listed, but only abnormal results are displayed) Labs Reviewed  BASIC METABOLIC PANEL - Abnormal; Notable for the following components:      Result Value   Sodium 130 (*)    Potassium 5.5 (*)    Chloride 95 (*)    Glucose, Bld 1,264 (*)    BUN 27 (*)    Creatinine, Ser 2.27 (*)    GFR, Estimated 29 (*)    All other components within normal limits  URINALYSIS, ROUTINE W REFLEX MICROSCOPIC - Abnormal; Notable for the following components:   Color, Urine STRAW (*)    Glucose, UA >=500 (*)    Hgb urine dipstick SMALL (*)    All other components within normal limits  HEPATIC FUNCTION PANEL - Abnormal; Notable for the following components:   Alkaline Phosphatase 223 (*)    Bilirubin, Direct 0.3 (*)    All other components within normal limits  CBG MONITORING, ED - Abnormal; Notable for the following components:   Glucose-Capillary >600 (*)    All other components within normal limits  CBG MONITORING, ED - Abnormal; Notable for the following components:   Glucose-Capillary >600 (*)    All other components within normal limits  CBC  OSMOLALITY  BASIC METABOLIC PANEL  CBG MONITORING, ED    EKG None  Radiology DG Chest 2 View  Result Date: 06/22/2021 CLINICAL DATA:  Polyuria and weakness x1 week EXAM: CHEST - 2 VIEW COMPARISON:  June 04, 2021 FINDINGS: Multiple sternal wires and vascular clips are noted. Mild, stable atelectasis and/or infiltrate is seen along the bilateral infrahilar regions. There is no evidence of a pleural effusion or pneumothorax. The cardiac silhouette is mildly enlarged and unchanged in size. The visualized skeletal structures are unremarkable. IMPRESSION: Stable cardiomegaly and stable areas of bilateral infrahilar atelectasis and/or infiltrate. Electronically Signed   By: Virgina Norfolk M.D.   On: 06/22/2021 17:26    Procedures Procedures   Medications Ordered in ED Medications  insulin regular, human (MYXREDLIN) 100 units/ 100 mL infusion (11.5 Units/hr Intravenous Infusion Verify 06/22/21 1812)  lactated ringers infusion ( Intravenous New Bag/Given 06/22/21 1737)  dextrose 5 % in lactated ringers infusion (has no administration in time range)  dextrose 50 % solution 0-50 mL (has no administration in time range)  sodium chloride 0.9 % bolus 1,000 mL (0 mLs Intravenous Stopped 06/22/21 1817)    ED Course  I have reviewed the triage vital signs and the nursing notes.  Pertinent labs & imaging results that were available during my care of the patient were reviewed by me and considered in my medical decision making (see chart for details). CRITICAL CARE Performed by: Milton Ferguson Total critical care time: 45 minutes Critical care time was exclusive of separately billable procedures and treating other patients. Critical care was necessary to treat or prevent imminent or life-threatening deterioration. Critical care was time spent personally by me on the following activities: development of treatment plan with patient and/or surrogate as well as nursing, discussions with consultants, evaluation of patient's response to treatment, examination of patient, obtaining history from patient or surrogate, ordering and performing treatments and interventions, ordering and review of laboratory studies, ordering and  review of radiographic studies, pulse oximetry and re-evaluation of patient's condition.   MDM Rules/Calculators/A&P  Patient with glucose of 1264.  He is put on insulin drip will be admitted to medicine Final Clinical Impression(s) / ED Diagnoses Final diagnoses:  None    Rx / DC Orders ED Discharge Orders     None        Milton Ferguson, MD 06/22/21 [phone]

## 2021-06-22 NOTE — ED Notes (Signed)
Per Endotool, continue insulin at 11.5 units/hour. Recheck at [employer]

## 2021-06-22 NOTE — ED Notes (Signed)
Per Endo, continue at 11.5 units/hr. Recheck in 30 minutes

## 2021-06-22 NOTE — ED Notes (Signed)
Per Endotool, rate change to 8.5units/hr, recheck in 30 minutes

## 2021-06-23 DIAGNOSIS — E1165 Type 2 diabetes mellitus with hyperglycemia: Secondary | ICD-10-CM

## 2021-06-23 DIAGNOSIS — C3491 Malignant neoplasm of unspecified part of right bronchus or lung: Secondary | ICD-10-CM

## 2021-06-23 DIAGNOSIS — E1100 Type 2 diabetes mellitus with hyperosmolarity without nonketotic hyperglycemic-hyperosmolar coma (NKHHC): Principal | ICD-10-CM

## 2021-06-23 DIAGNOSIS — N1831 Chronic kidney disease, stage 3a: Secondary | ICD-10-CM

## 2021-06-23 DIAGNOSIS — I2510 Atherosclerotic heart disease of native coronary artery without angina pectoris: Secondary | ICD-10-CM

## 2021-06-23 LAB — OSMOLALITY: Osmolality: 350 mOsm/kg (ref 275–295)

## 2021-06-23 LAB — BASIC METABOLIC PANEL
Anion gap: 13 (ref 5–15)
Anion gap: 7 (ref 5–15)
Anion gap: 8 (ref 5–15)
Anion gap: 7 (ref 5–15)
BUN: 25 mg/dL — ABNORMAL HIGH (ref 8–23)
BUN: 19 mg/dL (ref 8–23)
BUN: 17 mg/dL (ref 8–23)
BUN: 16 mg/dL (ref 8–23)
CO2: 23 mmol/L (ref 22–32)
CO2: 28 mmol/L (ref 22–32)
CO2: 26 mmol/L (ref 22–32)
CO2: 28 mmol/L (ref 22–32)
Calcium: 10.4 mg/dL — ABNORMAL HIGH (ref 8.9–10.3)
Calcium: 9.8 mg/dL (ref 8.9–10.3)
Calcium: 9.1 mg/dL (ref 8.9–10.3)
Calcium: 9.4 mg/dL (ref 8.9–10.3)
Chloride: 107 mmol/L (ref 98–111)
Chloride: 112 mmol/L — ABNORMAL HIGH (ref 98–111)
Chloride: 109 mmol/L (ref 98–111)
Chloride: 108 mmol/L (ref 98–111)
Creatinine, Ser: 1.93 mg/dL — ABNORMAL HIGH (ref 0.61–1.24)
Creatinine, Ser: 1.4 mg/dL — ABNORMAL HIGH (ref 0.61–1.24)
Creatinine, Ser: 1.47 mg/dL — ABNORMAL HIGH (ref 0.61–1.24)
Creatinine, Ser: 1.45 mg/dL — ABNORMAL HIGH (ref 0.61–1.24)
GFR, Estimated: 35 mL/min — ABNORMAL LOW (ref >60)
GFR, Estimated: 52 mL/min — ABNORMAL LOW (ref >60)
GFR, Estimated: 49 mL/min — ABNORMAL LOW (ref >60)
GFR, Estimated: 50 mL/min — ABNORMAL LOW (ref >60)
Glucose, Bld: 539 mg/dL (ref 70–99)
Glucose, Bld: 112 mg/dL — ABNORMAL HIGH (ref 70–99)
Glucose, Bld: 230 mg/dL — ABNORMAL HIGH (ref 70–99)
Glucose, Bld: 283 mg/dL — ABNORMAL HIGH (ref 70–99)
Potassium: 4.4 mmol/L (ref 3.5–5.1)
Potassium: 3.6 mmol/L (ref 3.5–5.1)
Potassium: 5.3 mmol/L — ABNORMAL HIGH (ref 3.5–5.1)
Potassium: 3.8 mmol/L (ref 3.5–5.1)
Sodium: 143 mmol/L (ref 135–145)
Sodium: 147 mmol/L — ABNORMAL HIGH (ref 135–145)
Sodium: 143 mmol/L (ref 135–145)
Sodium: 143 mmol/L (ref 135–145)

## 2021-06-23 LAB — CBC
HCT: 52.3 % — ABNORMAL HIGH (ref 39.0–52.0)
Hemoglobin: 17.7 g/dL — ABNORMAL HIGH (ref 13.0–17.0)
MCH: 31.8 pg (ref 26.0–34.0)
MCHC: 33.8 g/dL (ref 30.0–36.0)
MCV: 93.9 fL (ref 80.0–100.0)
Platelets: 223 10*3/uL (ref 150–400)
RBC: 5.57 MIL/uL (ref 4.22–5.81)
RDW: 13.1 % (ref 11.5–15.5)
WBC: 13 10*3/uL — ABNORMAL HIGH (ref 4.0–10.5)
nRBC: 0 % (ref 0.0–0.2)

## 2021-06-23 LAB — GLUCOSE, CAPILLARY
Glucose-Capillary: 270 mg/dL — ABNORMAL HIGH (ref 70–99)
Glucose-Capillary: 170 mg/dL — ABNORMAL HIGH (ref 70–99)

## 2021-06-23 LAB — CBG MONITORING, ED
Glucose-Capillary: 133 mg/dL — ABNORMAL HIGH (ref 70–99)
Glucose-Capillary: 152 mg/dL — ABNORMAL HIGH (ref 70–99)
Glucose-Capillary: 157 mg/dL — ABNORMAL HIGH (ref 70–99)
Glucose-Capillary: 186 mg/dL — ABNORMAL HIGH (ref 70–99)
Glucose-Capillary: 195 mg/dL — ABNORMAL HIGH (ref 70–99)
Glucose-Capillary: 206 mg/dL — ABNORMAL HIGH (ref 70–99)
Glucose-Capillary: 208 mg/dL — ABNORMAL HIGH (ref 70–99)
Glucose-Capillary: 210 mg/dL — ABNORMAL HIGH (ref 70–99)
Glucose-Capillary: 289 mg/dL — ABNORMAL HIGH (ref 70–99)
Glucose-Capillary: 296 mg/dL — ABNORMAL HIGH (ref 70–99)
Glucose-Capillary: 303 mg/dL — ABNORMAL HIGH (ref 70–99)

## 2021-06-23 MED ORDER — INSULIN GLARGINE 100 UNIT/ML ~~LOC~~ SOLN
10.0000 [IU] | Freq: Every day | SUBCUTANEOUS | Status: DC
  Administered 2021-06-23 – 2021-06-24 (×2): 10 [IU] via SUBCUTANEOUS
  Filled 2021-06-23 (×3): qty 0.1

## 2021-06-23 MED ORDER — LACTATED RINGERS IV SOLN: INTRAVENOUS | Status: DC

## 2021-06-23 MED ORDER — INSULIN ASPART 100 UNIT/ML IJ SOLN
0.0000 [IU] | Freq: Three times a day (TID) | INTRAMUSCULAR | Status: DC
  Administered 2021-06-23: 3 [IU] via SUBCUTANEOUS
  Administered 2021-06-23 – 2021-06-24 (×2): 8 [IU] via SUBCUTANEOUS
  Administered 2021-06-24: 5 [IU] via SUBCUTANEOUS
  Filled 2021-06-23: qty 0.15

## 2021-06-23 MED ORDER — SODIUM CHLORIDE 0.9 % IV SOLN: INTRAVENOUS | Status: DC

## 2021-06-23 MED ORDER — INSULIN DETEMIR 100 UNIT/ML ~~LOC~~ SOLN: 10.0000 [IU] | Freq: Every day | SUBCUTANEOUS | Status: DC

## 2021-06-23 MED ORDER — INSULIN ASPART 100 UNIT/ML IJ SOLN
0.0000 [IU] | Freq: Every day | INTRAMUSCULAR | Status: DC
  Filled 2021-06-23: qty 0.05

## 2021-06-23 NOTE — ED Notes (Signed)
Pt cbg

## 2021-06-23 NOTE — ED Notes (Signed)
Pt cbg 289, insulin drip was d/c writer left drip on according to endo tool protocols until fast acting insulin arrives.

## 2021-06-23 NOTE — Progress Notes (Signed)
[facility]  PROGRESS NOTE  Paul [name] RRN:[id number] DOB: 07/29/1946 DOA: 06/22/2021 PCP: [name]   Brief HPI:   75 year old male with history of diabetes mellitus type 2, CAD, hypertension, hypothyroidism, ischemic cardiomyopathy, lung [facility], [facility] presented to ED with complaints of elevated blood glucose.  Patient blood glucose was elevated to 1200.  In the ED patient was found to be in hyperosmolar hyperglycemic state.  Started on IV insulin and IV fluids.    Subjective   Patient seen and examined, blood glucose has improved.  Patient does not take any medications at home.  Hemoglobin A1c is currently pending.   Assessment/Plan:     Hyperosmolar hyperglycemic state -Resolved -Presented with blood glucose of 1264;  -started on IV insulin and IV fluids; CBG back to normal  Diabetes mellitus type 2 -Does not take any medications at home -Hemoglobin A1c has been obtained, currently pending -We will have to initiate oral hypoglycemic agents and possibly Lantus once hemoglobin A1c results are back  Hyperkalemia -Potassium is 5.3 -Continue normal saline at 75 mill per hour -Follow BMP in am  Adenocarcinoma of the lung -[facility]  Chronic systolic dysfunction -Euvolemic -EF 30 to 35%; Lasix on hold -Continue Coreg, Entresto  CKD stage III -Creatinine at baseline  Coronary artery disease -Stable; no chest pain -Continue aspirin, Coreg  Hypothyroidism -Continue Synthroid   Scheduled medications:    enoxaparin (LOVENOX) injection  40 mg Subcutaneous Q24H   insulin aspart  0-15 Units Subcutaneous TID WC   insulin aspart  0-5 Units Subcutaneous QHS   insulin glargine  10 Units Subcutaneous Daily         Data Reviewed:   CBG:  Recent Labs  Lab 06/23/21 0732 06/23/21 0856 06/23/21 0946 06/23/21 1047 06/23/21 1111  GLUCAP 157* 289* 208* 206* 186*    SpO2: 97 %    Vitals:   06/23/21 1030 06/23/21  1200 06/23/21 1315 06/23/21 1450  BP: (!) 153/82 (!) 158/86 133/81 (!) 149/99  Pulse: 68 80 (!) 103 97  Resp: 17 17 (!) 26 18  Temp:    98.4 F (36.9 C)  TempSrc:    Oral  SpO2: 100% 94% 97% 97%  Weight:      Height:         Intake/Output Summary (Last 24 hours) at 06/23/2021 1516 Last data filed at 06/22/2021 1817 Gross per 24 hour  Intake 1500 ml  Output --  Net 1500 ml    06/24 1901 - 06/26 0700 In: 1500 [I.V.:500] Out: -   Filed Weights   06/22/21 1601  Weight: 105.2 kg    CBC:  Recent Labs  Lab 06/22/21 1604 06/22/21 2112  WBC 9.9 13.0*  HGB 15.8 17.7*  HCT 48.8 52.3*  PLT 232 223  MCV 96.8 93.9  MCH 31.3 31.8  MCHC 32.4 33.8  RDW 13.2 13.1    Complete metabolic panel:  Recent Labs  Lab 06/22/21 1604 06/22/21 2112 06/23/21 0512 06/23/21 0912  NA 130* 143 147* 143  K 5.5* 4.4 3.6 5.3*  CL 95* 107 112* 109  CO2 24 23 28 26   GLUCOSE 1,264* 539* 112* 230*  BUN 27* 25* 19 17  CREATININE 2.27* 1.93* 1.40* 1.47*  CALCIUM 9.8 10.4* 9.8 9.1  AST 28  --   --   --   ALT 25  --   --   --   ALKPHOS 223*  --   --   --   BILITOT  1.1  --   --   --   ALBUMIN 4.2  --   --   --     No results for input(s): LIPASE, AMYLASE in the last 168 hours.  No results for input(s): CRP, DDIMER, BNP, PROCALCITON, SARSCOV2NAA in the last 168 hours.  Invalid input(s): LACTICACID  ------------------------------------------------------------------------------------------------------------------ No results for input(s): CHOL, HDL, LDLCALC, TRIG, CHOLHDL, LDLDIRECT in the last 72 hours.  No results found for: HGBA1C ------------------------------------------------------------------------------------------------------------------ No results for input(s): TSH, T4TOTAL, T3FREE, THYROIDAB in the last 72 hours.  Invalid input(s): FREET3 ------------------------------------------------------------------------------------------------------------------ No results for input(s):  VITAMINB12, FOLATE, FERRITIN, TIBC, IRON, RETICCTPCT in the last 72 hours.  Coagulation profile No results for input(s): INR, PROTIME in the last 168 hours. No results for input(s): DDIMER in the last 72 hours.  Cardiac Enzymes No results for input(s): CKTOTAL, CKMB, CKMBINDEX, TROPONINI in the last 168 hours.  ------------------------------------------------------------------------------------------------------------------    Component Value Date/Time   BNP 871.7 (H) 01/13/2019 1109     Antibiotics: Anti-infectives (From admission, onward)    None        Radiology Reports  DG Chest 2 View  Result Date: 06/22/2021 CLINICAL DATA:  Polyuria and weakness x1 week EXAM: CHEST - 2 VIEW COMPARISON:  June 04, 2021 FINDINGS: Multiple sternal wires and vascular clips are noted. Mild, stable atelectasis and/or infiltrate is seen along the bilateral infrahilar regions. There is no evidence of a pleural effusion or pneumothorax. The cardiac silhouette is mildly enlarged and unchanged in size. The visualized skeletal structures are unremarkable. IMPRESSION: Stable cardiomegaly and stable areas of bilateral infrahilar atelectasis and/or infiltrate. Electronically Signed   By: Virgina Norfolk M.D.   On: 06/22/2021 17:26      DVT prophylaxis: Lovenox  Code Status: Full code  Family Communication: No family at bedside   Consultants:   Procedures:     Objective    Physical Examination:  General-appears in no acute distress Heart-S1-S2, regular, no murmur auscultated Lungs-clear to auscultation bilaterally, no wheezing or crackles auscultated Abdomen-soft, nontender, no organomegaly Extremities-no edema in the lower extremities Neuro-alert, oriented x3, no focal deficit noted  Status is: Inpatient  Dispo: The patient is from: Home              Anticipated d/c is to: Home              Anticipated d/c date is: 06/24/2021              Patient currently not stable for  discharge  Barrier to discharge-awaiting hemoglobin A1c to adjust home medications and start insulin  COVID-19 Labs  No results for input(s): DDIMER, FERRITIN, LDH, CRP in the last 72 hours.  Lab Results  Component Value Date   [place] NEGATIVE 05/31/2021   [place] NEGATIVE 01/06/2021   [place] NEGATIVE 09/19/2020    Microbiology  No results found for this or any previous visit (from the past 240 hour(s)).           Oswald Hillock   [facility] If 7PM-7AM, please contact night-coverage at [email or web address], Office  [phone]   06/23/2021, 3:16 PM  LOS: 1 day

## 2021-06-23 NOTE — ED Notes (Signed)
Breakfast tray given. °

## 2021-06-23 NOTE — Progress Notes (Addendum)
Inpatient Diabetes Program Recommendations  AACE/ADA: New Consensus Statement on Inpatient Glycemic Control (2015)  Target Ranges:  Prepandial:   less than 140 mg/dL      Peak postprandial:   less than 180 mg/dL (1-2 hours)      Critically ill patients:  140 - 180 mg/dL   Lab Results  Component Value Date   GLUCAP 157 (H) 06/23/2021    Review of Glycemic Control Results for OSAMU, OLGUIN (MRN [id number]) as of 06/23/2021 08:17  Ref. Range 06/22/2021 16:04  Glucose Latest Ref Range: 70 - 99 mg/dL 1,264 (HH)   Diabetes history: DM2 Outpatient Diabetes medications:  Current orders for Inpatient glycemic control: IV Insulin  Inpatient Diabetes Program Recommendations:    To ED with [facility].  When MD is ready to transition to SQ, please consider:  Lantus 10 units 2 hours prior to discontinuing Endotool Novolog 0-15 units TID and 0-5 units QHS Carb modified diet  DM coordinator is working remotely this weekend.  Attempted to call patient's cell phone with no answer.  Will reach out to RN and try to contact patient.     Addendum @ 0854-Spoke with patient on the phone.  He does not take any DM medications.  He used to take Metformin and lost weight; no longer takes.  Spoke with pt about basic pathophysiology of DM Type 2.  He is currently being treated at the [facility] for lung cancer (possible steroids?).   RNs to provide ongoing basic DM education at bedside with this patient. Have ordered educational booklet and placed RD consult for DM diet education for this patient. Discussed CHO's and importance of eliminating beverages with sugar.  He does drink regulars sodas and juices.  Explained what an A1C is and that we are waiting on results which will guide recommendations as far as adding home DM medications.    Will continue to follow while inpatient.  Thank you, Reche Dixon, RN, BSN Diabetes Coordinator Inpatient Diabetes Program [phone] (team pager from 8a-5p)

## 2021-06-23 NOTE — ED Notes (Signed)
Per endotool, insulin stopped. Recheck glucose in 1 hour

## 2021-06-23 NOTE — ED Notes (Signed)
Per endotool, rate change 7.5units/hr. recheck in 1 hour

## 2021-06-23 NOTE — ED Notes (Signed)
0605 verified with Kennon Holter, NP it was ok to hold restart for 1 hour and reassess. Repeat CBG was 195.  Insulin restarted per Endotool to recommended rate.

## 2021-06-23 NOTE — ED Notes (Addendum)
Patient was repositioned in bed and given a warm blanket.

## 2021-06-23 NOTE — ED Notes (Signed)
Pt has put clothes back on and taken cardiac leads off.

## 2021-06-23 NOTE — ED Notes (Addendum)
Endotool recommendation is to restart insulin at 1unit/hr. Message sent to Jonelle Sidle, MD to verify.  Latest cbg 133 with maintenance IVF of LR and 5%dextrose/LR and no insulin infusing. Previous CBG was 152

## 2021-06-23 NOTE — ED Notes (Signed)
Floor coverage paged to verify restart of insulin.

## 2021-06-23 NOTE — ED Notes (Signed)
Patient was given a diet soda after checking with the RN.

## 2021-06-24 ENCOUNTER — Telehealth: Payer: Self-pay | Admitting: *Deleted

## 2021-06-24 ENCOUNTER — Other Ambulatory Visit: Payer: No Typology Code available for payment source

## 2021-06-24 ENCOUNTER — Encounter: Payer: Self-pay | Admitting: *Deleted

## 2021-06-24 ENCOUNTER — Ambulatory Visit (HOSPITAL_COMMUNITY): Admission: RE | Admit: 2021-06-24 | Payer: No Typology Code available for payment source | Source: Ambulatory Visit

## 2021-06-24 ENCOUNTER — Encounter (HOSPITAL_COMMUNITY): Payer: Self-pay | Admitting: Family Medicine

## 2021-06-24 DIAGNOSIS — I10 Essential (primary) hypertension: Secondary | ICD-10-CM

## 2021-06-24 LAB — BASIC METABOLIC PANEL
Anion gap: 6 (ref 5–15)
BUN: 12 mg/dL (ref 8–23)
CO2: 26 mmol/L (ref 22–32)
Calcium: 8.6 mg/dL — ABNORMAL LOW (ref 8.9–10.3)
Chloride: 109 mmol/L (ref 98–111)
Creatinine, Ser: 1.26 mg/dL — ABNORMAL HIGH (ref 0.61–1.24)
GFR, Estimated: 59 mL/min — ABNORMAL LOW (ref >60)
Glucose, Bld: 227 mg/dL — ABNORMAL HIGH (ref 70–99)
Potassium: 3.6 mmol/L (ref 3.5–5.1)
Sodium: 141 mmol/L (ref 135–145)

## 2021-06-24 LAB — GLUCOSE, CAPILLARY
Glucose-Capillary: 209 mg/dL — ABNORMAL HIGH (ref 70–99)
Glucose-Capillary: 275 mg/dL — ABNORMAL HIGH (ref 70–99)

## 2021-06-24 LAB — HEMOGLOBIN A1C
Hgb A1c MFr Bld: 12.9 % — ABNORMAL HIGH (ref 4.8–5.6)
Mean Plasma Glucose: 324 mg/dL

## 2021-06-24 MED ORDER — METFORMIN HCL 500 MG PO TABS: 500.0000 mg | ORAL_TABLET | Freq: Two times a day (BID) | ORAL | 3 refills | Status: DC

## 2021-06-24 MED ORDER — BLOOD GLUCOSE MONITOR KIT: PACK | 0 refills | Status: DC

## 2021-06-24 MED ORDER — LIVING WELL WITH DIABETES BOOK
Freq: Once | Status: DC
  Filled 2021-06-24: qty 1

## 2021-06-24 MED ORDER — INSULIN GLARGINE 100 UNIT/ML SOLOSTAR PEN: 10.0000 [IU] | PEN_INJECTOR | Freq: Every day | SUBCUTANEOUS | 5 refills | Status: DC

## 2021-06-24 MED ORDER — INSULIN STARTER KIT- PEN NEEDLES (ENGLISH)
1.0000 | Freq: Once | Status: DC
  Filled 2021-06-24: qty 1

## 2021-06-24 MED FILL — Dexamethasone Sodium Phosphate Inj 100 MG/10ML: INTRAMUSCULAR | Qty: 1 | Status: AC

## 2021-06-24 MED FILL — Fosaprepitant Dimeglumine For IV Infusion 150 MG (Base Eq): INTRAVENOUS | Qty: 5 | Status: AC

## 2021-06-24 NOTE — Telephone Encounter (Signed)
I received a message that Mr. Paul [name] has been denied tx at [facility] by the [place].  I called Paul [name] to update him that his appts are cancelled. [facility]  I called radiology and cancelled that appt as well. I will update Dr. Julien Nordmann that patient will [facility].

## 2021-06-24 NOTE — Progress Notes (Signed)
Inpatient Diabetes Program Recommendations  AACE/ADA: New Consensus Statement on Inpatient Glycemic Control (2015)  Target Ranges:  Prepandial:   less than 140 mg/dL      Peak postprandial:   less than 180 mg/dL (1-2 hours)      Critically ill patients:  140 - 180 mg/dL   Lab Results  Component Value Date   GLUCAP 275 (H) 06/24/2021   HGBA1C 12.9 (H) 06/22/2021    Review of Glycemic Control  Diabetes history: DM2 Outpatient Diabetes medications: None. Previously on metformin Current orders for Inpatient glycemic control: Lantus 10 units QD, Novolog 0-15 units TID with meals and 0-5 HS  HgbA1C - 12.9%  Inpatient Diabetes Program Recommendations:    Educated patient on insulin pen use at home.  Reviewed all steps if insulin pen including attachment of needle, 2-unit air shot, dialing up dose, giving injection, removing needle, disposal of sharps, storage of unused insulin, disposal of insulin etc. Patient able to provide successful return demonstration. Also reviewed troubleshooting with insulin pen. MD to give patient Rxs for insulin pens and insulin pen needles.   Reviewed diet, exercise along with hypoglycemia s/s and treatment. Answered questions.  To be d/ced on:  Lantus 10 units QD Metformin 500 mg BID  Prescription for blood glucose meter kit and insulin pen needles will be given.  F/U with PCP at [facility] for diabetes management. Take blood sugar log to appt. [facility].  Discussed above with RN.  Thank you. Lorenda Peck, RD, LDN, CDE Inpatient Diabetes Coordinator [phone]

## 2021-06-24 NOTE — Telephone Encounter (Signed)
Pt education done in person inpatient on Novolumab, Pemetrexed, & Carboplatin.  Informed of possible side effects.  Discussed nutrition, hydration, & infection prevention. Discussed symptom management & chemo/immuno alert card & instructions given.  Pt reports that he has to go to [facility] get treatment here until [place] authorized.  Upon returning to cancer center, appts have been canceled.  Notified Diane RN/Dr Julien Nordmann.

## 2021-06-24 NOTE — Discharge Summary (Signed)
Physician Discharge Summary  Paul [name] ZOX:[id number] DOB: Oct 26, 1946 DOA: 06/22/2021  PCP: [name]  Admit date: 06/22/2021 Discharge date: 06/24/2021  Time spent: 60 minutes  Recommendations for Outpatient Follow-up:  Follow-up PCP in 2 weeks   Discharge Diagnoses:  Principal Problem:   Hyperglycemia due to diabetes mellitus (Paul [name]) Active Problems:   Adenocarcinoma of right lung, stage 3 (HCC)   GERD (gastroesophageal reflux disease)   Essential hypertension   Hyperlipidemia   CKD (chronic kidney disease) stage 3, GFR 30-59 ml/min (HCC)   CAD, multiple vessel   Hyperosmolar hyperglycemic state (HHS) ([place])   Discharge Condition: Stable  Diet recommendation: Heart healthy diet  Filed Weights   06/22/21 1601  Weight: 105.2 kg    History of present illness:  75 year old male with history of diabetes mellitus type 2, CAD, hypertension, hypothyroidism, ischemic cardiomyopathy, lung cancer, [facility], [facility] presented to ED with complaints of elevated blood glucose.  Patient blood glucose was elevated to 1200.   In the ED patient was found to be in hyperosmolar hyperglycemic state.  Started on IV insulin and IV fluids.  Hospital Course:   Hyperosmolar hyperglycemic state -Resolved -Presented with blood glucose of 1264; -started on IV insulin and IV fluids; CBG back to normal   Diabetes mellitus type 2 -Does not take any medications at home -Hemoglobin A1c has been obtained, currently pending -We will discharge home on Lantus 10 units subcu daily, metformin 500 mg p.o. twice daily -Follow-up PCP for further adjustment of medications.   Hyperkalemia -Resolved  Adenocarcinoma of the lung -[facility]   Chronic systolic dysfunction -Euvolemic -EF 30 to 35%; Lasix on hold -Continue Coreg, Entresto   CKD stage III -Creatinine at baseline   Coronary artery disease -Stable; no chest pain -Continue aspirin, Coreg    Hypothyroidism -Continue Synthroid    Procedures:   Consultations:   Discharge Exam: Vitals:   06/24/21 0228 06/24/21 0435  BP: (!) 149/84 (!) 164/84  Pulse: 85 82  Resp: 20 20  Temp: 98.1 F (36.7 C) 98.1 F (36.7 C)  SpO2: 95% 97%    General: Appears in no acute distress Cardiovascular: S1-S2, regular Respiratory: Clear to auscultation bilaterally  Discharge Instructions   Discharge Instructions     Diet - low sodium heart healthy   Complete by: As directed    Increase activity slowly   Complete by: As directed       Allergies as of 06/24/2021   No Known Allergies      Medication List     TAKE these medications    aspirin 81 MG chewable tablet Take 81 mg by mouth daily.   blood glucose meter kit and supplies Kit Dispense based on patient and insurance preference. Use up to four times daily as directed.   carvedilol 25 MG tablet Commonly known as: COREG Take 25 mg by mouth at bedtime.   folic acid 1 MG tablet Commonly known as: FOLVITE Take 1 tablet (1 mg total) by mouth daily.   insulin glargine 100 UNIT/ML Solostar Pen Commonly known as: LANTUS Inject 10 Units into the skin daily.   levothyroxine 25 MCG tablet Commonly known as: SYNTHROID Take 25 mcg by mouth at bedtime.   metFORMIN 500 MG tablet Commonly known as: Glucophage Take 1 tablet (500 mg total) by mouth 2 (two) times daily with a meal.   multivitamin with minerals tablet Take 1 tablet by mouth daily.   Muscle Rub 10-15 % Crea Apply 1 application topically  daily as needed for muscle pain.   nitroGLYCERIN 0.4 MG SL tablet Commonly known as: NITROSTAT Place 0.4 mg under the tongue every 5 (five) minutes as needed for chest pain.   omeprazole 20 MG capsule Commonly known as: PRILOSEC Take 20 mg by mouth daily.   prochlorperazine 10 MG tablet Commonly known as: COMPAZINE Take 1 tablet (10 mg total) by mouth every 6 (six) hours as needed for nausea or vomiting.    sacubitril-valsartan 49-51 MG Commonly known as: ENTRESTO Take 0.5 tablets by mouth 2 (two) times daily.       ASK your doctor about these medications    furosemide 20 MG tablet Commonly known as: LASIX Take 20 mg by mouth at bedtime.       No Known Allergies    The results of significant diagnostics from this hospitalization (including imaging, microbiology, ancillary and laboratory) are listed below for reference.    Significant Diagnostic Studies: DG Chest 2 View  Result Date: 06/22/2021 CLINICAL DATA:  Polyuria and weakness x1 week EXAM: CHEST - 2 VIEW COMPARISON:  June 04, 2021 FINDINGS: Multiple sternal wires and vascular clips are noted. Mild, stable atelectasis and/or infiltrate is seen along the bilateral infrahilar regions. There is no evidence of a pleural effusion or pneumothorax. The cardiac silhouette is mildly enlarged and unchanged in size. The visualized skeletal structures are unremarkable. IMPRESSION: Stable cardiomegaly and stable areas of bilateral infrahilar atelectasis and/or infiltrate. Electronically Signed   By: Virgina Norfolk M.D.   On: 06/22/2021 17:26   NM PET Image Initial (PI) Skull Base To Thigh  Result Date: 05/31/2021 CLINICAL DATA:  Initial treatment strategy for pulmonary nodule. EXAM: NUCLEAR MEDICINE PET SKULL BASE TO THIGH TECHNIQUE: 12.0 mCi F-18 FDG was injected intravenously. Full-ring PET imaging was performed from the skull base to thigh after the radiotracer. CT data was obtained and used for attenuation correction and anatomic localization. Fasting blood glucose: 152 mg/dl COMPARISON:  05/30/2021 CT scan FINDINGS: Mediastinal blood pool activity: SUV max 3.1 Liver activity: SUV max NA NECK: No significant abnormal hypermetabolic activity in this region. Incidental CT findings: none CHEST: Persistent right lower lobe consolidation with some associated volume loss, maximum regional SUV 6.3. The distribution of metabolic activity is more  striking along the periphery of this process Airspace opacity in the left lower lobe anteriorly on image 97 of series 4 measures 4.6 by 3.2 cm, with maximum SUV of 5.0. There is some adjacent ground-glass opacity centrally in the left lower lobe. 1.0 cm right lower paratracheal lymph node on image 68 series 4 with maximum SUV 3.5 Mildly accentuated distal esophageal activity with maximum SUV 4.4, likely physiologic. Focal anterior mediastinal activity along a marker site along the anterior aortic arch, probably artifactual, maximum SUV 5.4. Incidental CT findings: Cardiomegaly and prior CABG. Coronary, aortic arch, and branch vessel atherosclerotic vascular disease. Mild bilateral gynecomastia. ABDOMEN/PELVIS: No significant abnormal hypermetabolic activity in this region. Incidental CT findings: Aortoiliac atherosclerotic vascular disease. Left scrotal hydrocele. SKELETON: No significant abnormal hypermetabolic activity in this region. Incidental CT findings: Degenerative arthropathy of the hips, left greater than right. Bilateral mesoacromial os acromiale. IMPRESSION: 1. The progressively consolidated region in the right lower lobe has a maximum SUV of 6.3, and the enlarging area of consolidation anteriorly in the left lower lobe has a maximum SUV of 5.0. Possibilities include granulomatous consolidation or malignancy. 2. Faintly hypermetabolic borderline enlarged right lower paratracheal lymph node. 3. Other imaging findings of potential clinical significance: Aortic Atherosclerosis (ICD10-I70.0). Coronary  atherosclerosis. Cardiomegaly with prior CABG. Left scrotal hydrocele. Electronically Signed   By: Van Clines M.D.   On: 05/31/2021 10:01   DG CHEST PORT 1 VIEW  Result Date: 06/04/2021 CLINICAL DATA:  Status post bronchoscopy EXAM: PORTABLE CHEST 1 VIEW COMPARISON:  January 06, 2021 chest radiograph; chest CT May 30, 2021; PET-CT May 31, 2021 FINDINGS: No pneumothorax. There is persistent airspace  consolidation in the right lower lobe medially. Lungs elsewhere are clear. There is cardiomegaly with pulmonary vascularity normal. Patient is status post coronary artery bypass grafting. No adenopathy. No bone lesions. IMPRESSION: Airspace opacity medial right base. No pneumothorax. Stable cardiomegaly. Status post coronary artery bypass grafting. Electronically Signed   By: Lowella Grip III M.D.   On: 06/04/2021 15:13   ECHOCARDIOGRAM COMPLETE  Result Date: 05/28/2021    ECHOCARDIOGRAM REPORT   Patient Name:   Paul [name] Date of Exam: 05/28/2021 Medical Rec #:  [id number]       Height:       68.0 in Accession #:    [id number]      Weight:       245.4 lb Date of Birth:  [birth year]       BSA:          2.229 m Patient Age:    36 years        BP:           126/70 mmHg Patient Gender: M               HR:           67 bpm. Exam Location:  Outpatient Procedure: 2D Echo Indications:    Cardiomyopathy-Ischemic I25.5  History:        Patient has no prior history of Echocardiogram examinations.                 Risk Factors:Hypertension and Diabetes.  Sonographer:    Mikki Santee RDCS (AE) Referring Phys: [id number] [place]  1. Left ventricular ejection fraction, by estimation, is 30 to 35%. The left ventricle has moderately decreased function. The left ventricle has no regional wall motion abnormalities. Left ventricular diastolic parameters are consistent with Grade I diastolic dysfunction (impaired relaxation).  2. Right ventricular systolic function is normal. The right ventricular size is normal. There is normal pulmonary artery systolic pressure.  3. Left atrial size was mildly dilated.  4. Right atrial size was mildly dilated.  5. The mitral valve is grossly normal. Trivial mitral valve regurgitation.  6. The aortic valve is grossly normal. Aortic valve regurgitation is not visualized. No aortic stenosis is present. FINDINGS  Left Ventricle: Left ventricular ejection fraction, by  estimation, is 30 to 35%. The left ventricle has moderately decreased function. The left ventricle has no regional wall motion abnormalities. The left ventricular internal cavity size was normal in size. There is no left ventricular hypertrophy. Left ventricular diastolic parameters are consistent with Grade I diastolic dysfunction (impaired relaxation). Right Ventricle: The right ventricular size is normal. Right vetricular wall thickness was not well visualized. Right ventricular systolic function is normal. There is normal pulmonary artery systolic pressure. The tricuspid regurgitant velocity is 2.70 m/s, and with an assumed right atrial pressure of 3 mmHg, the estimated right ventricular systolic pressure is 83.4 mmHg. Left Atrium: Left atrial size was mildly dilated. Right Atrium: Right atrial size was mildly dilated. Pericardium: There is no evidence of pericardial effusion. Mitral Valve: The mitral valve is grossly normal. Trivial mitral valve  regurgitation. Tricuspid Valve: The tricuspid valve is grossly normal. Tricuspid valve regurgitation is trivial. Aortic Valve: The aortic valve is grossly normal. Aortic valve regurgitation is not visualized. No aortic stenosis is present. Pulmonic Valve: The pulmonic valve was normal in structure. Pulmonic valve regurgitation is not visualized. Aorta: The aortic root and ascending aorta are structurally normal, with no evidence of dilitation. IAS/Shunts: The atrial septum is grossly normal.  LEFT VENTRICLE PLAX 2D LVIDd:         5.60 cm      Diastology LVIDs:         4.80 cm      LV e' medial:    4.28 cm/s LV PW:         1.10 cm      LV E/e' medial:  13.2 LV IVS:        1.30 cm      LV e' lateral:   4.20 cm/s LVOT diam:     2.20 cm      LV E/e' lateral: 13.5 LV SV:         60 LV SV Index:   27 LVOT Area:     3.80 cm  LV Volumes (MOD) LV vol d, MOD A2C: 203.0 ml LV vol d, MOD A4C: 172.0 ml LV vol s, MOD A2C: 115.0 ml LV vol s, MOD A4C: 131.0 ml LV SV MOD A2C:     88.0  ml LV SV MOD A4C:     172.0 ml LV SV MOD BP:      64.0 ml RIGHT VENTRICLE RV S prime:     6.23 cm/s TAPSE (M-mode): 0.9 cm LEFT ATRIUM             Index       RIGHT ATRIUM           Index LA diam:        4.30 cm 1.93 cm/m  RA Area:     18.50 cm LA Vol (A2C):   81.4 ml 36.51 ml/m RA Volume:   53.40 ml  23.95 ml/m LA Vol (A4C):   72.4 ml 32.48 ml/m LA Biplane Vol: 80.1 ml 35.93 ml/m  AORTIC VALVE LVOT Vmax:   79.30 cm/s LVOT Vmean:  48.000 cm/s LVOT VTI:    0.158 m  AORTA Ao Root diam: 3.50 cm Ao Asc diam:  3.70 cm MITRAL VALVE               TRICUSPID VALVE MV Area (PHT): 3.12 cm    TR Peak grad:   29.2 mmHg MV Decel Time: 243 msec    TR Vmax:        270.00 cm/s MV E velocity: 56.60 cm/s MV A velocity: 90.40 cm/s  SHUNTS MV E/A ratio:  0.63        Systemic VTI:  0.16 m                            Systemic Diam: 2.20 cm Mertie Moores MD Electronically signed by Mertie Moores MD Signature Date/Time: 05/28/2021/6:19:37 PM    Final    CT Super D Chest Wo Contrast  Result Date: 05/30/2021 CLINICAL DATA:  Pre bronchoscopy. EXAM: CT CHEST WITHOUT CONTRAST TECHNIQUE: Multidetector CT imaging of the chest was performed using thin slice collimation for electromagnetic bronchoscopy planning purposes, without intravenous contrast. COMPARISON:  None. FINDINGS: Cardiovascular: Post CABG Mediastinum/Nodes: No axillary supraclavicular adenopathy no mediastinal hilar adenopathy. RIGHT lower paratracheal lymph node  is upper limits of normal at 10 mm. Lungs/Pleura: Consolidation in the medial RIGHT lower lobe with air bronchograms. The consolidative mass/process measures 7.5 x 5.1 cm. Mild focus consolidation in the anterior aspect of the LEFT lower lobe on image 118/4). Upper lungs are clear. Upper Abdomen: Limited view of the liver, kidneys, pancreas are unremarkable. Normal adrenal glands. Musculoskeletal: No aggressive osseous lesion IMPRESSION: 1. Rounded consolidation in the medial RIGHT lower lobe with air bronchograms is  suggestive of pulmonary infection. Cannot exclude neoplasm. 2. No evidence of mediastinal lymphadenopathy. Borderline enlarged RIGHT lower paratracheal node. 3. Post CABG Electronically Signed   By: Suzy Bouchard M.D.   On: 05/30/2021 15:28   DG C-ARM BRONCHOSCOPY  Result Date: 06/04/2021 C-ARM BRONCHOSCOPY: Fluoroscopy was utilized by the requesting physician.  No radiographic interpretation.    Microbiology: No results found for this or any previous visit (from the past 240 hour(s)).   Labs: Basic Metabolic Panel: Recent Labs  Lab 06/22/21 2112 06/23/21 0512 06/23/21 0912 06/23/21 1509 06/24/21 0555  NA 143 147* 143 143 141  K 4.4 3.6 5.3* 3.8 3.6  CL 107 112* 109 108 109  CO2 _0 GLUCOSE 539* 112* 230* 283* 227*  BUN 25* _1 CREATININE 1.93* 1.40* 1.47* 1.45* 1.26*  CALCIUM 10.4* 9.8 9.1 9.4 8.6*   Liver Function Tests: Recent Labs  Lab 06/22/21 1604  AST 28  ALT 25  ALKPHOS 223*  BILITOT 1.1  PROT 7.7  ALBUMIN 4.2   CBC: Recent Labs  Lab 06/22/21 1604 06/22/21 2112  WBC 9.9 13.0*  HGB 15.8 17.7*  HCT 48.8 52.3*  MCV 96.8 93.9  PLT 232 223     CBG: Recent Labs  Lab 06/23/21 1111 06/23/21 1629 06/23/21 2144 06/24/21 0807 06/24/21 1137  GLUCAP 186* 270* 170* 209* 275*       Signed:  Oswald Hillock MD.  [facility] 06/24/2021, 11:46 AM

## 2021-06-24 NOTE — TOC Transition Note (Signed)
Transition of Care [facility]) - CM/SW Discharge Note   Patient Details  Name: Paul [name] MRN: [id number] Date of Birth: [birth year]  [facility] CM/SW Contact:  Dessa Phi, RN Phone Number: 06/24/2021, 12:37 PM   Clinical Narrative: d/c home. No CM needs or orders.            Patient Goals and CMS Choice        Discharge Placement                       Discharge Plan and Services                                     Social Determinants of Health (SDOH) Interventions     Readmission Risk Interventions No flowsheet data found.

## 2021-06-24 NOTE — Progress Notes (Signed)
I notified [facility] to cancel molecular test request.

## 2021-06-25 ENCOUNTER — Ambulatory Visit: Payer: No Typology Code available for payment source

## 2021-06-25 LAB — CULTURE, FUNGUS WITHOUT SMEAR

## 2021-07-02 ENCOUNTER — Ambulatory Visit: Payer: No Typology Code available for payment source | Admitting: Physician Assistant

## 2021-07-02 ENCOUNTER — Other Ambulatory Visit: Payer: No Typology Code available for payment source

## 2021-07-04 ENCOUNTER — Other Ambulatory Visit: Payer: Self-pay

## 2021-07-04 ENCOUNTER — Ambulatory Visit (INDEPENDENT_AMBULATORY_CARE_PROVIDER_SITE_OTHER): Payer: No Typology Code available for payment source | Admitting: Pulmonary Disease

## 2021-07-04 ENCOUNTER — Encounter: Payer: Self-pay | Admitting: Pulmonary Disease

## 2021-07-04 VITALS — BP 158/90 | HR 77 | Ht 68.0 in | Wt 243.0 lb

## 2021-07-04 DIAGNOSIS — I255 Ischemic cardiomyopathy: Secondary | ICD-10-CM | POA: Diagnosis not present

## 2021-07-04 DIAGNOSIS — C3491 Malignant neoplasm of unspecified part of right bronchus or lung: Secondary | ICD-10-CM | POA: Diagnosis not present

## 2021-07-04 DIAGNOSIS — F17200 Nicotine dependence, unspecified, uncomplicated: Secondary | ICD-10-CM

## 2021-07-04 NOTE — Patient Instructions (Signed)
Thank you for visiting Dr. Valeta Harms at [facility]. Today we recommend the following:  Return in about 3 months (around 10/04/2021) for with APP or Dr. Valeta Harms.    Please do your part to reduce the spread of COVID-19.

## 2021-07-04 NOTE — Progress Notes (Signed)
Synopsis: Referred in May 2022 for abnormal CT chest by [facility], [name]  Subjective:   PATIENT ID: Paul [name] GENDER: male DOB: [birth year], MRN: [id number]  Chief Complaint  Patient presents with   Lung Mass    Follow up bronch    PMH of HTN, HLD, pre-diabetic, obese, smoker (40+ years), 0.5 ppd.  Patient is cared for at the [facility].  Patient was seen in the office by Dr. Mosetta Pigeon.  We appreciate the referral patient was referred for abnormal CT imaging and concern for need of bronchoscopy.  Patient has a possible concern of right lower lobe worsening consolidation on CT with a enlarging lung nodule.  Denied hemoptysis.  Longstanding history of smoking.  Pulmonary function test in January 2022 with an FEV1 of 1.34 L, 58% predicted, ratio of 77, DLCO of 38%, additionally has a history of ischemic cardiomyopathy with chronic systolic heart failure and ejection fraction of 25 to 30%, follows with [place] cardiology and has moderate pulmonary hypertension.  Dr. Mirian Capuchin referred here for evaluation to have bronchoscopy in [facility] due to the patient's multiple medical comorbidities and the potential for advanced bronchoscopy needs.  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  Echocardiogram completed around this time reveals an ejection fraction of 25 to 30% with grade 2 diastolic dysfunction patient had CT scan of the chest that was completed on 04/02/2021 this reveals some small paratracheal and subcarinal nodes a small unchanged right trace pleural effusion.  Additionally patient has worsening a right lower lobe consolidation.  Also found to have a 20.0 x 10 mm posterior right middle lobe nodule that had increased in size of from 7 mm in August 2021.  Additionally these persistent basilar infiltrates and consolidation  which measure approximately 6 3 cm in size.  This continued progression of the entire right lower lobe is concerning for malignancy versus atypical pneumonia or chronic infection.  Patient was referred for consideration of bronchoscopy.  OV 07/04/2021: Here today for follow-up after bronchoscopy and establishing care with medical oncology.  He was seen in follow-up by Dr. Earlie Server.  Also returned back to the [place].  Had follow-up with pulmonary at the [facility] as well as oncology.  He states that they were going to send an authorization for him to have his work done here at [facility] for treatment.  He has not had treatments started.      Past Medical History:  Diagnosis Date   CHF (congestive heart failure) ([place])    Coronary artery disease    Diabetes mellitus without complication ([place])    Hypertension    Hypothyroidism    Ischemic cardiomyopathy    Pneumonia    a long time ago   Sleep apnea    no longer uses a cpap     Family History  Family history unknown: Yes     Past Surgical History:  Procedure Laterality Date   BRONCHIAL BIOPSY  06/04/2021   Procedure: BRONCHIAL BIOPSIES;  Surgeon: Garner Nash, DO;  Location: [place] [facility];  Service: Pulmonary;;   BRONCHIAL BRUSHINGS  06/04/2021   Procedure: BRONCHIAL BRUSHINGS;  Surgeon: Garner Nash, DO;  Location: [place] [facility];  Service: Pulmonary;;   BRONCHIAL NEEDLE ASPIRATION BIOPSY  06/04/2021   Procedure: BRONCHIAL NEEDLE ASPIRATION BIOPSIES;  Surgeon: Garner Nash, DO;  Location: [place] [facility];  Service: Pulmonary;;  BRONCHIAL WASHINGS  06/04/2021   Procedure: BRONCHIAL WASHINGS;  Surgeon: Garner Nash, DO;  Location: [place] [facility];  Service: Pulmonary;;   CARDIAC SURGERY     COLONOSCOPY     CORONARY ARTERY BYPASS GRAFT     2019 at [place] Right 06/04/2021   Procedure: VIDEO BRONCHOSCOPY WITH ENDOBRONCHIAL NAVIGATION;  Surgeon: Garner Nash, DO;  Location: [place];   Service: Pulmonary;  Laterality: Right;   VIDEO BRONCHOSCOPY WITH ENDOBRONCHIAL ULTRASOUND  06/04/2021   Procedure: VIDEO BRONCHOSCOPY WITH ENDOBRONCHIAL ULTRASOUND;  Surgeon: Garner Nash, DO;  Location: [facility];  Service: Pulmonary;;    Social History   Socioeconomic History   Marital status: Single    Spouse name: Not on file   Number of children: Not on file   Years of education: Not on file   Highest education level: Not on file  Occupational History   Not on file  Tobacco Use   Smoking status: Some Days    Packs/day: 0.50    Pack years: 0.00    Types: Cigarettes    Start date: 1965   Smokeless tobacco: Never   Tobacco comments:    Started smoking in [place] age 9  Vaping Use   Vaping Use: Never used  Substance and Sexual Activity   Alcohol use: No   Drug use: Never   Sexual activity: Not on file  Other Topics Concern   Not on file  Social History Narrative   Not on file   Social Determinants of Health   Financial Resource Strain: Not on file  Food Insecurity: Not on file  Transportation Needs: Not on file  Physical Activity: Not on file  Stress: Not on file  Social Connections: Not on file  Intimate Partner Violence: Not on file     No Known Allergies   Outpatient Medications Prior to Visit  Medication Sig Dispense Refill   aspirin 81 MG chewable tablet Take 81 mg by mouth daily.     blood glucose meter kit and supplies KIT Dispense based on patient and insurance preference. Use up to four times daily as directed. 1 each 0   carvedilol (COREG) 25 MG tablet Take 25 mg by mouth at bedtime.     folic acid (FOLVITE) 1 MG tablet Take 1 tablet (1 mg total) by mouth daily. 30 tablet 4   furosemide (LASIX) 20 MG tablet Take 20 mg by mouth at bedtime.     insulin glargine (LANTUS) 100 UNIT/ML Solostar Pen Inject 10 Units into the skin daily. 5 mL 5   levothyroxine (SYNTHROID) 25 MCG tablet Take 25 mcg by mouth at bedtime.     Menthol-Methyl Salicylate  (MUSCLE RUB) 10-15 % CREA Apply 1 application topically daily as needed for muscle pain.     metFORMIN (GLUCOPHAGE) 500 MG tablet Take 1 tablet (500 mg total) by mouth 2 (two) times daily with a meal. 60 tablet 3   Multiple Vitamins-Minerals (MULTIVITAMIN WITH MINERALS) tablet Take 1 tablet by mouth daily.     nitroGLYCERIN (NITROSTAT) 0.4 MG SL tablet Place 0.4 mg under the tongue every 5 (five) minutes as needed for chest pain.     omeprazole (PRILOSEC) 20 MG capsule Take 20 mg by mouth daily.     prochlorperazine (COMPAZINE) 10 MG tablet Take 1 tablet (10 mg total) by mouth every 6 (six) hours as needed for nausea or vomiting. 30 tablet 0   sacubitril-valsartan (ENTRESTO) 49-51 MG Take 0.5  tablets by mouth 2 (two) times daily.     No facility-administered medications prior to visit.    Review of Systems  Constitutional:  Negative for chills, fever, malaise/fatigue and weight loss.  HENT:  Negative for hearing loss, sore throat and tinnitus.   Eyes:  Negative for blurred vision and double vision.  Respiratory:  Negative for cough, hemoptysis, sputum production, shortness of breath, wheezing and stridor.   Cardiovascular:  Negative for chest pain, palpitations, orthopnea, leg swelling and PND.  Gastrointestinal:  Negative for abdominal pain, constipation, diarrhea, heartburn, nausea and vomiting.  Genitourinary:  Negative for dysuria, hematuria and urgency.  Musculoskeletal:  Negative for joint pain and myalgias.  Skin:  Negative for itching and rash.  Neurological:  Negative for dizziness, tingling, weakness and headaches.  Endo/Heme/Allergies:  Negative for environmental allergies. Does not bruise/bleed easily.  Psychiatric/Behavioral:  Negative for depression. The patient is not nervous/anxious and does not have insomnia.   All other systems reviewed and are negative.   Objective:  Physical Exam Vitals reviewed.  Constitutional:      General: He is not in acute distress.     Appearance: He is well-developed. He is obese.  HENT:     Head: Normocephalic and atraumatic.  Eyes:     General: No scleral icterus.    Conjunctiva/sclera: Conjunctivae normal.     Pupils: Pupils are equal, round, and reactive to light.  Neck:     Vascular: No JVD.     Trachea: No tracheal deviation.  Cardiovascular:     Rate and Rhythm: Normal rate and regular rhythm.     Heart sounds: Normal heart sounds. No murmur heard. Pulmonary:     Effort: Pulmonary effort is normal. No tachypnea, accessory muscle usage or respiratory distress.     Breath sounds: Normal breath sounds. No stridor. No wheezing, rhonchi or rales.  Abdominal:     General: Bowel sounds are normal. There is no distension.     Palpations: Abdomen is soft.     Tenderness: There is no abdominal tenderness.  Musculoskeletal:        General: No tenderness.     Cervical back: Neck supple.  Lymphadenopathy:     Cervical: No cervical adenopathy.  Skin:    General: Skin is warm and dry.     Capillary Refill: Capillary refill takes less than 2 seconds.     Findings: No rash.  Neurological:     Mental Status: He is alert and oriented to person, place, and time.  Psychiatric:        Behavior: Behavior normal.     Vitals:   07/04/21 1358  BP: (!) 158/90  Pulse: 77  SpO2: 100%  Weight: 243 lb (110.2 kg)  Height: 5' 8"  (1.727 m)   100% on RA BMI Readings from Last 3 Encounters:  07/04/21 36.95 kg/m  06/22/21 35.28 kg/m  06/17/21 35.72 kg/m   Wt Readings from Last 3 Encounters:  07/04/21 243 lb (110.2 kg)  06/22/21 232 lb (105.2 kg)  06/17/21 234 lb 14.4 oz (106.5 kg)     CBC    Component Value Date/Time   WBC 13.0 (H) 06/22/2021 2112   RBC 5.57 06/22/2021 2112   HGB 17.7 (H) 06/22/2021 2112   HCT 52.3 (H) 06/22/2021 2112   PLT 223 06/22/2021 2112   MCV 93.9 06/22/2021 2112   MCH 31.8 06/22/2021 2112   MCHC 33.8 06/22/2021 2112   RDW 13.1 06/22/2021 2112   LYMPHSABS 1.8 01/13/2019 1109  MONOABS 1.0 01/13/2019 1109   EOSABS 0.3 01/13/2019 1109   BASOSABS 0.1 01/13/2019 1109    ECHO VAMC: EF25%   Chest Imaging: January 2022 chest x-ray: Bilateral lower lobe consolidation The patient's images have been independently reviewed by me.    CT scan report [facility]. Completed 04/19/2021: Consolidation approximately 3 cm basilar left anterior lower lobe increased in size, 20 x 10 mm posterior right middle lobe nodule increased in size from 7 mm concerning for malignancy. Report reviewed  Pulmonary Functions Testing Results: No flowsheet data found.  FeNO: no  Pathology: no  Echocardiogram: no  Heart Catheterization: no    Assessment & Plan:     ICD-10-CM   1. Adenocarcinoma of right lung, stage 3 (HCC)  C34.91     2. Current smoker  F17.200     3. Ischemic cardiomyopathy  I25.5       Discussion:  75 year old gentleman, current smoker, ischemic cardiomyopathy, pulmonary hypertension found to have an enlarging right middle lobe nodule and persistent left lower lobe basilar consolidation that was concerning for malignancy.  Patient was taken for bronchoscopy with tissue biopsy that was positive for adenocarcinoma.  Plan: Patient had initial consultation with Dr. Earlie Server here with medical oncology.  Patient was sent back to the [facility] health system. Patient's states that the [place] recommended him to have his treatment here done at [facility]. He has yet to start treatment. I will reach out to medical oncology here to see if they can get him in and started.    Current Outpatient Medications:    aspirin 81 MG chewable tablet, Take 81 mg by mouth daily., Disp: , Rfl:    blood glucose meter kit and supplies KIT, Dispense based on patient and insurance preference. Use up to four times daily as directed., Disp: 1 each, Rfl: 0   carvedilol (COREG) 25 MG tablet, Take 25 mg by mouth at bedtime., Disp: , Rfl:    folic acid (FOLVITE) 1 MG tablet, Take 1 tablet (1 mg  total) by mouth daily., Disp: 30 tablet, Rfl: 4   furosemide (LASIX) 20 MG tablet, Take 20 mg by mouth at bedtime., Disp: , Rfl:    insulin glargine (LANTUS) 100 UNIT/ML Solostar Pen, Inject 10 Units into the skin daily., Disp: 5 mL, Rfl: 5   levothyroxine (SYNTHROID) 25 MCG tablet, Take 25 mcg by mouth at bedtime., Disp: , Rfl:    Menthol-Methyl Salicylate (MUSCLE RUB) 10-15 % CREA, Apply 1 application topically daily as needed for muscle pain., Disp: , Rfl:    metFORMIN (GLUCOPHAGE) 500 MG tablet, Take 1 tablet (500 mg total) by mouth 2 (two) times daily with a meal., Disp: 60 tablet, Rfl: 3   Multiple Vitamins-Minerals (MULTIVITAMIN WITH MINERALS) tablet, Take 1 tablet by mouth daily., Disp: , Rfl:    nitroGLYCERIN (NITROSTAT) 0.4 MG SL tablet, Place 0.4 mg under the tongue every 5 (five) minutes as needed for chest pain., Disp: , Rfl:    omeprazole (PRILOSEC) 20 MG capsule, Take 20 mg by mouth daily., Disp: , Rfl:    prochlorperazine (COMPAZINE) 10 MG tablet, Take 1 tablet (10 mg total) by mouth every 6 (six) hours as needed for nausea or vomiting., Disp: 30 tablet, Rfl: 0   sacubitril-valsartan (ENTRESTO) 49-51 MG, Take 0.5 tablets by mouth 2 (two) times daily., Disp: , Rfl:     Garner Nash, DO [facility] 07/04/2021 2:10 PM

## 2021-07-05 ENCOUNTER — Telehealth: Payer: Self-pay | Admitting: Medical Oncology

## 2021-07-05 NOTE — Telephone Encounter (Signed)
  Pt presented in lobby asking for appt with Dr Julien Nordmann.  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

## 2021-07-09 ENCOUNTER — Other Ambulatory Visit: Payer: No Typology Code available for payment source

## 2021-07-11 ENCOUNTER — Telehealth: Payer: Self-pay | Admitting: *Deleted

## 2021-07-11 NOTE — Telephone Encounter (Signed)
I received an update from authorization coordinator that patient is approved for tx.  Scheduling will call and update him on his schedule. I will notify Dr. Julien Nordmann to update plan of care.  I will also follow up with radiation.

## 2021-07-11 NOTE — Telephone Encounter (Signed)
I received a message that mr. Medeiros came to [facility] yesterday stating he has authorization from [place] to get his treatment here.  I called patient and he states he saw the [place] last week and they stated he could get his tx here. I then contacted authorization coordinator to get an update on authorization.  Wait for response.

## 2021-07-16 ENCOUNTER — Inpatient Hospital Stay
Admission: RE | Admit: 2021-07-16 | Discharge: 2021-07-16 | Disposition: A | Discharge status: Home / Self Care | Payer: Self-pay | Source: Ambulatory Visit | Attending: Internal Medicine | Admitting: Internal Medicine

## 2021-07-16 ENCOUNTER — Other Ambulatory Visit: Payer: No Typology Code available for payment source

## 2021-07-16 ENCOUNTER — Ambulatory Visit
Admission: RE | Admit: 2021-07-16 | Discharge: 2021-07-16 | Disposition: A | Discharge status: Home / Self Care | Payer: Self-pay | Source: Ambulatory Visit | Attending: Internal Medicine | Admitting: Internal Medicine

## 2021-07-16 ENCOUNTER — Other Ambulatory Visit (HOSPITAL_COMMUNITY): Payer: Self-pay | Admitting: Internal Medicine

## 2021-07-16 ENCOUNTER — Ambulatory Visit: Payer: No Typology Code available for payment source

## 2021-07-16 ENCOUNTER — Other Ambulatory Visit: Payer: Self-pay | Admitting: *Deleted

## 2021-07-16 ENCOUNTER — Ambulatory Visit: Payer: No Typology Code available for payment source | Admitting: Internal Medicine

## 2021-07-16 DIAGNOSIS — C801 Malignant (primary) neoplasm, unspecified: Secondary | ICD-10-CM

## 2021-07-16 DIAGNOSIS — C3491 Malignant neoplasm of unspecified part of right bronchus or lung: Secondary | ICD-10-CM

## 2021-07-16 NOTE — Progress Notes (Signed)
Received images from [facility] on disc.  Added outside imaging orders and sent disc to [facility] in radiology to upload these images.

## 2021-07-19 ENCOUNTER — Encounter: Payer: Self-pay | Admitting: *Deleted

## 2021-07-19 DIAGNOSIS — C3491 Malignant neoplasm of unspecified part of right bronchus or lung: Secondary | ICD-10-CM

## 2021-07-19 LAB — ACID FAST CULTURE WITH REFLEXED SENSITIVITIES (MYCOBACTERIA)
Acid Fast Culture: NEGATIVE
Acid Fast Culture: NEGATIVE

## 2021-07-19 NOTE — Progress Notes (Signed)
I received a call from the front desk. Mr. Banh showed up stating the [place] wants him to get tx here. Paul [name] updated me that he does have authorization to be seen here. I gave front desk an appt for patient to be seen with Dr. Julien Nordmann.

## 2021-07-23 ENCOUNTER — Telehealth: Payer: Self-pay | Admitting: Emergency Medicine

## 2021-07-23 ENCOUNTER — Other Ambulatory Visit: Payer: No Typology Code available for payment source

## 2021-07-23 NOTE — Telephone Encounter (Signed)
[id number] - Customer service manager for the Discovery and Validation of Biomarkers for the Prediction, Diagnosis, and Management of Disease  07/23/21  3:51 PM:  Called patient to follow concerning interest in this research study.  Patient did not want to come in early on Tuesday, prior to his already scheduled lab draw.    Will discuss the study at his upcoming appointment on Tuesday, 07/30/21.  Will review the consent at that time and go over next steps in participation if the patient is interested.  Clabe Seal Clinical Research Coordinator I  07/23/21 4:37 PM

## 2021-07-30 ENCOUNTER — Encounter: Payer: Self-pay | Admitting: *Deleted

## 2021-07-30 ENCOUNTER — Other Ambulatory Visit: Payer: Self-pay

## 2021-07-30 ENCOUNTER — Inpatient Hospital Stay: Payer: No Typology Code available for payment source

## 2021-07-30 ENCOUNTER — Inpatient Hospital Stay: Payer: No Typology Code available for payment source | Attending: Internal Medicine | Admitting: Internal Medicine

## 2021-07-30 VITALS — BP 186/96 | HR 90 | Temp 97.6°F | Resp 20 | Ht 68.0 in | Wt 245.0 lb

## 2021-07-30 DIAGNOSIS — E119 Type 2 diabetes mellitus without complications: Secondary | ICD-10-CM | POA: Diagnosis not present

## 2021-07-30 DIAGNOSIS — Z794 Long term (current) use of insulin: Secondary | ICD-10-CM | POA: Diagnosis not present

## 2021-07-30 DIAGNOSIS — Z79899 Other long term (current) drug therapy: Secondary | ICD-10-CM | POA: Diagnosis not present

## 2021-07-30 DIAGNOSIS — Z5111 Encounter for antineoplastic chemotherapy: Secondary | ICD-10-CM | POA: Insufficient documentation

## 2021-07-30 DIAGNOSIS — C3491 Malignant neoplasm of unspecified part of right bronchus or lung: Secondary | ICD-10-CM

## 2021-07-30 DIAGNOSIS — C3431 Malignant neoplasm of lower lobe, right bronchus or lung: Secondary | ICD-10-CM | POA: Insufficient documentation

## 2021-07-30 DIAGNOSIS — Z7982 Long term (current) use of aspirin: Secondary | ICD-10-CM | POA: Diagnosis not present

## 2021-07-30 DIAGNOSIS — Z5112 Encounter for antineoplastic immunotherapy: Secondary | ICD-10-CM | POA: Insufficient documentation

## 2021-07-30 DIAGNOSIS — I10 Essential (primary) hypertension: Secondary | ICD-10-CM | POA: Insufficient documentation

## 2021-07-30 LAB — CBC WITH DIFFERENTIAL (CANCER CENTER ONLY)
Abs Immature Granulocytes: 0.03 10*3/uL (ref 0.00–0.07)
Basophils Absolute: 0.1 10*3/uL (ref 0.0–0.1)
Basophils Relative: 1 %
Eosinophils Absolute: 0.2 10*3/uL (ref 0.0–0.5)
Eosinophils Relative: 3 %
HCT: 42.6 % (ref 39.0–52.0)
Hemoglobin: 14.2 g/dL (ref 13.0–17.0)
Immature Granulocytes: 0 %
Lymphocytes Relative: 24 %
Lymphs Abs: 1.9 10*3/uL (ref 0.7–4.0)
MCH: 31.9 pg (ref 26.0–34.0)
MCHC: 33.3 g/dL (ref 30.0–36.0)
MCV: 95.7 fL (ref 80.0–100.0)
Monocytes Absolute: 0.8 10*3/uL (ref 0.1–1.0)
Monocytes Relative: 10 %
Neutro Abs: 5.1 10*3/uL (ref 1.7–7.7)
Neutrophils Relative %: 62 %
Platelet Count: 266 10*3/uL (ref 150–400)
RBC: 4.45 MIL/uL (ref 4.22–5.81)
RDW: 14.4 % (ref 11.5–15.5)
WBC Count: 8.2 10*3/uL (ref 4.0–10.5)
nRBC: 0 % (ref 0.0–0.2)

## 2021-07-30 LAB — CMP (CANCER CENTER ONLY)
ALT: 8 U/L (ref 0–44)
AST: 12 U/L — ABNORMAL LOW (ref 15–41)
Albumin: 3.5 g/dL (ref 3.5–5.0)
Alkaline Phosphatase: 116 U/L (ref 38–126)
Anion gap: 7 (ref 5–15)
BUN: 12 mg/dL (ref 8–23)
CO2: 27 mmol/L (ref 22–32)
Calcium: 9.7 mg/dL (ref 8.9–10.3)
Chloride: 105 mmol/L (ref 98–111)
Creatinine: 1.48 mg/dL — ABNORMAL HIGH (ref 0.61–1.24)
GFR, Estimated: 48 mL/min — ABNORMAL LOW (ref >60)
Glucose, Bld: 294 mg/dL — ABNORMAL HIGH (ref 70–99)
Potassium: 4.2 mmol/L (ref 3.5–5.1)
Sodium: 139 mmol/L (ref 135–145)
Total Bilirubin: 0.4 mg/dL (ref 0.3–1.2)
Total Protein: 6.9 g/dL (ref 6.5–8.1)

## 2021-07-30 LAB — TSH: TSH: 3.261 u[IU]/mL (ref 0.350–4.500)

## 2021-07-30 MED ORDER — CYANOCOBALAMIN 1000 MCG/ML IJ SOLN
1000.0000 ug | Freq: Once | INTRAMUSCULAR | Status: AC
Start: 2021-07-30 — End: 2021-07-30
  Administered 2021-07-30: 1000 ug via INTRAMUSCULAR

## 2021-07-30 MED ORDER — CYANOCOBALAMIN 1000 MCG/ML IJ SOLN
INTRAMUSCULAR | Status: AC
  Filled 2021-07-30: qty 1

## 2021-07-30 NOTE — Progress Notes (Signed)
[place] Telephone:([phone]   Fax:([phone]) [place] [address]  DIAGNOSIS: stage IIIb (T3b, N2, M0) non-small cell lung cancer, adenocarcinoma presented with large right lower lobe lung mass and suspicious right lower paratracheal lymphadenopathy diagnosed in June 2022.  PRIOR THERAPY: None.  CURRENT THERAPY: Neoadjuvant systemic chemotherapy according to the Checkmate 816 with carboplatin for AUC of 5, Alimta 500 Mg/M2 and nivolumab 360 Mg IV every 3 weeks for 3 cycles.  First dose August 06, 2021.  INTERVAL HISTORY: Paul [name] 75 y.o. male returns to the clinic today for follow-up visit.  The patient was seen more than 6 weeks ago and was expected to start the neoadjuvant course of systemic chemotherapy with carboplatin, Alimta and nivolumab but he was followed by the [place] system and there was no preauthorization at that time to start the treatment.  He was seen by his team at the [facility] facility and he received approval to proceed with the treatment.  He is back today for reevaluation and consideration of treatment of his lung cancer.  The patient denied having any current chest pain but has shortness of breath with exertion with mild cough and no hemoptysis.  He denied having any fever or chills.  He has no nausea, vomiting, diarrhea or constipation.  He has no headache or visual changes.  MEDICAL HISTORY: Past Medical History:  Diagnosis Date   CHF (congestive heart failure) ([place])    Coronary artery disease    Diabetes mellitus without complication ([place])    Hypertension    Hypothyroidism    Ischemic cardiomyopathy    Pneumonia    a long time ago   Sleep apnea    no longer uses a cpap    ALLERGIES:  has No Known Allergies.  MEDICATIONS:  Current Outpatient Medications  Medication Sig Dispense Refill   aspirin 81 MG chewable tablet Take 81 mg by mouth daily.      blood glucose meter kit and supplies KIT Dispense based on patient and insurance preference. Use up to four times daily as directed. 1 each 0   carvedilol (COREG) 25 MG tablet Take 25 mg by mouth at bedtime.     folic acid (FOLVITE) 1 MG tablet Take 1 tablet (1 mg total) by mouth daily. 30 tablet 4   furosemide (LASIX) 20 MG tablet Take 20 mg by mouth at bedtime.     insulin glargine (LANTUS) 100 UNIT/ML Solostar Pen Inject 10 Units into the skin daily. 5 mL 5   levothyroxine (SYNTHROID) 25 MCG tablet Take 25 mcg by mouth at bedtime.     Menthol-Methyl Salicylate (MUSCLE RUB) 10-15 % CREA Apply 1 application topically daily as needed for muscle pain.     metFORMIN (GLUCOPHAGE) 500 MG tablet Take 1 tablet (500 mg total) by mouth 2 (two) times daily with a meal. 60 tablet 3   Multiple Vitamins-Minerals (MULTIVITAMIN WITH MINERALS) tablet Take 1 tablet by mouth daily.     nitroGLYCERIN (NITROSTAT) 0.4 MG SL tablet Place 0.4 mg under the tongue every 5 (five) minutes as needed for chest pain.     omeprazole (PRILOSEC) 20 MG capsule Take 20 mg by mouth daily.     prochlorperazine (COMPAZINE) 10 MG tablet Take 1 tablet (10 mg total) by mouth every 6 (six) hours as needed for nausea or vomiting. 30 tablet 0   sacubitril-valsartan (ENTRESTO) 49-51 MG Take 0.5 tablets by  mouth 2 (two) times daily.     No current facility-administered medications for this visit.    SURGICAL HISTORY:  Past Surgical History:  Procedure Laterality Date   BRONCHIAL BIOPSY  06/04/2021   Procedure: BRONCHIAL BIOPSIES;  Surgeon: Garner Nash, DO;  Location: [facility];  Service: Pulmonary;;   BRONCHIAL BRUSHINGS  06/04/2021   Procedure: BRONCHIAL BRUSHINGS;  Surgeon: Garner Nash, DO;  Location: [place] [facility];  Service: Pulmonary;;   BRONCHIAL NEEDLE ASPIRATION BIOPSY  06/04/2021   Procedure: BRONCHIAL NEEDLE ASPIRATION BIOPSIES;  Surgeon: Garner Nash, DO;  Location: [place] [facility]OSCOPY;  Service: Pulmonary;;    BRONCHIAL WASHINGS  06/04/2021   Procedure: BRONCHIAL WASHINGS;  Surgeon: Garner Nash, DO;  Location: [place] [facility]COPY;  Service: Pulmonary;;   CARDIAC SURGERY     COLONOSCOPY     CORONARY ARTERY BYPASS GRAFT     2019 at [employer] Right 06/04/2021   Procedure: [place];  Surgeon: Garner Nash, DO;  Location: [place];  Service: Pulmonary;  Laterality: Right;   VIDEO BRONCHOSCOPY WITH ENDOBRONCHIAL ULTRASOUND  06/04/2021   Procedure: VIDEO BRONCHOSCOPY WITH ENDOBRONCHIAL ULTRASOUND;  Surgeon: Garner Nash, DO;  Location: [place] [facility];  Service: Pulmonary;;    REVIEW OF SYSTEMS:  Constitutional: positive for fatigue Eyes: negative Ears, nose, mouth, throat, and face: negative Respiratory: positive for dyspnea on exertion Cardiovascular: negative Gastrointestinal: negative Genitourinary:negative Integument/breast: negative Hematologic/lymphatic: negative Musculoskeletal:negative Neurological: negative Behavioral/Psych: negative Endocrine: negative Allergic/Immunologic: negative   PHYSICAL EXAMINATION: General appearance: alert, cooperative, fatigued, and no distress Head: Normocephalic, without obvious abnormality, atraumatic Neck: no adenopathy, no JVD, supple, symmetrical, trachea midline, and thyroid not enlarged, symmetric, no tenderness/mass/nodules Lymph nodes: Cervical, supraclavicular, and axillary nodes normal. Resp: clear to auscultation bilaterally Back: symmetric, no curvature. ROM normal. No CVA tenderness. Cardio: regular rate and rhythm, S1, S2 normal, no murmur, click, rub or gallop GI: soft, non-tender; bowel sounds normal; no masses,  no organomegaly Extremities: extremities normal, atraumatic, no cyanosis or edema Neurologic: Alert and oriented X 3, normal strength and tone. Normal symmetric reflexes. Normal coordination and gait  ECOG PERFORMANCE STATUS: 1 -  Symptomatic but completely ambulatory  Blood pressure (!) 186/96, pulse 90, temperature 97.6 F (36.4 C), temperature source Tympanic, resp. rate 20, height 5' 8" (1.727 m), weight 245 lb (111.1 kg), SpO2 100 %.  LABORATORY DATA: Lab Results  Component Value Date   WBC 8.2 07/30/2021   HGB 14.2 07/30/2021   HCT 42.6 07/30/2021   MCV 95.7 07/30/2021   PLT 266 07/30/2021      Chemistry      Component Value Date/Time   NA 141 06/24/2021 0555   K 3.6 06/24/2021 0555   CL 109 06/24/2021 0555   CO2 26 06/24/2021 0555   BUN 12 06/24/2021 0555   CREATININE 1.26 (H) 06/24/2021 0555      Component Value Date/Time   CALCIUM 8.6 (L) 06/24/2021 0555   ALKPHOS 223 (H) 06/22/2021 1604   AST 28 06/22/2021 1604   ALT 25 06/22/2021 1604   BILITOT 1.1 06/22/2021 1604       RADIOGRAPHIC STUDIES: No results found.  ASSESSMENT AND PLAN: This is a very pleasant 75 years old African-American male diagnosed with a stage IIIb (T3b, N2, M0) non-small cell lung cancer, adenocarcinoma presented with large right lower lobe lung mass and suspicious right lower lobe paratracheal lymphadenopathy diagnosed in June 2022. I will complete the staging work-up by ordering  MRI of the brain to rule out brain metastasis. I will send a blood test to [facility] for molecular studies. I had a lengthy discussion with the patient today again about his current condition and treatment options. I discussed with the patient the option of neoadjuvant systemic chemotherapy according to the Checkmate 816 clinical trial with carboplatin for AUC of 5, Alimta 500 Mg/M2 and nivolumab 360 Mg IV every 3 weeks followed by evaluation for surgical resection or additional concurrent chemoradiation as the patient is not a surgical candidate. The patient is interested in this option and he is expected to start the first dose of this treatment next week. I will arrange for the patient to have a chemotherapy education class before the  first dose of his treatment. He will receive vitamin B12 injection today and the patient already had prescription for folic acid and Compazine at home. He will come back for follow-up visit in 2 weeks for evaluation and management of any adverse effect of his treatment. For the hypertension, we will recheck his blood pressure before going home today.  He did not take his blood pressure medication before coming to the clinic today.  I strongly encouraged the patient to monitor his blood pressure closely and to call his primary care physician for adjustment of his medication if needed. The patient was advised to call immediately if he has any concerning symptoms in the interval. The patient voices understanding of current disease status and treatment options and is in agreement with the current care plan.  All questions were answered. The patient knows to call the clinic with any problems, questions or concerns. We can certainly see the patient much sooner if necessary.  The total time spent in the appointment was 40 minutes.  Disclaimer: This note was dictated with voice recognition software. Similar sounding words can inadvertently be transcribed and may not be corrected upon review.        

## 2021-07-30 NOTE — Research (Signed)
[id number] - Customer service manager for the Discovery and Validation of Biomarkers for the Prediction, Diagnosis, and Management of Disease   The research nurse met with the pt for 20 minutes after the pt was seen by Dr. Julien Nordmann.  The pt stated that his treatment was scheduled to begin next week.  The pt had questions about the study.  The research nurse began reading through the consent with the pt so he could have a better understanding of the study and the study requirements.  The nurse answered his questions.  The pt asked the nurse if all patients are approached by the research staff.  The nurse explained to the pt how patients are screened for research studies at the [facility].  The nurse then asked the patient if he was uncomfortable with this study.  He stated immediately that he did not want to participate in this study and have his blood used for research purposes.  The pt was thanked for his consideration of this study.  The nurse wished the pt well with his treatment next week.   Brion Aliment RN, BSN, CCRP Clinical Research Nurse Lead 07/30/2021 1:29 PM

## 2021-07-31 ENCOUNTER — Telehealth: Payer: Self-pay | Admitting: Medical Oncology

## 2021-07-31 NOTE — Telephone Encounter (Signed)
At the [facility] request ,I faxed last office note to Sam.

## 2021-08-01 ENCOUNTER — Telehealth: Payer: Self-pay | Admitting: Medical Oncology

## 2021-08-01 NOTE — Telephone Encounter (Signed)
[facility] needs his insurance clarified.   Pts  medicare part A  does not cover lab tests.   [employer] insurance-is terminated.  Please call Cristie Hem with any other insurance information and if we are providing financial assistance to pt.  Alex may be able to help pt with financial assistance for the [employer] testing.

## 2021-08-02 ENCOUNTER — Encounter: Payer: Self-pay | Admitting: Internal Medicine

## 2021-08-06 ENCOUNTER — Ambulatory Visit: Payer: No Typology Code available for payment source

## 2021-08-06 ENCOUNTER — Ambulatory Visit: Payer: No Typology Code available for payment source | Admitting: Internal Medicine

## 2021-08-06 ENCOUNTER — Other Ambulatory Visit: Payer: No Typology Code available for payment source

## 2021-08-13 ENCOUNTER — Encounter: Payer: Self-pay | Admitting: *Deleted

## 2021-08-13 ENCOUNTER — Other Ambulatory Visit: Payer: Self-pay

## 2021-08-13 ENCOUNTER — Encounter: Payer: Self-pay | Admitting: Internal Medicine

## 2021-08-13 ENCOUNTER — Telehealth: Payer: Self-pay | Admitting: *Deleted

## 2021-08-13 ENCOUNTER — Inpatient Hospital Stay: Payer: No Typology Code available for payment source

## 2021-08-13 ENCOUNTER — Inpatient Hospital Stay (HOSPITAL_BASED_OUTPATIENT_CLINIC_OR_DEPARTMENT_OTHER): Payer: No Typology Code available for payment source | Admitting: Internal Medicine

## 2021-08-13 ENCOUNTER — Ambulatory Visit: Payer: No Typology Code available for payment source

## 2021-08-13 VITALS — BP 141/69 | HR 77 | Temp 98.3°F | Resp 19 | Ht 68.0 in | Wt 246.2 lb

## 2021-08-13 DIAGNOSIS — C3491 Malignant neoplasm of unspecified part of right bronchus or lung: Secondary | ICD-10-CM

## 2021-08-13 DIAGNOSIS — Z5112 Encounter for antineoplastic immunotherapy: Secondary | ICD-10-CM

## 2021-08-13 DIAGNOSIS — Z5111 Encounter for antineoplastic chemotherapy: Secondary | ICD-10-CM

## 2021-08-13 LAB — CBC WITH DIFFERENTIAL (CANCER CENTER ONLY)
Abs Immature Granulocytes: 0.03 10*3/uL (ref 0.00–0.07)
Basophils Absolute: 0.1 10*3/uL (ref 0.0–0.1)
Basophils Relative: 1 %
Eosinophils Absolute: 0.2 10*3/uL (ref 0.0–0.5)
Eosinophils Relative: 3 %
HCT: 43.1 % (ref 39.0–52.0)
Hemoglobin: 14 g/dL (ref 13.0–17.0)
Immature Granulocytes: 0 %
Lymphocytes Relative: 20 %
Lymphs Abs: 1.7 10*3/uL (ref 0.7–4.0)
MCH: 30.8 pg (ref 26.0–34.0)
MCHC: 32.5 g/dL (ref 30.0–36.0)
MCV: 94.9 fL (ref 80.0–100.0)
Monocytes Absolute: 0.8 10*3/uL (ref 0.1–1.0)
Monocytes Relative: 10 %
Neutro Abs: 5.3 10*3/uL (ref 1.7–7.7)
Neutrophils Relative %: 66 %
Platelet Count: 249 10*3/uL (ref 150–400)
RBC: 4.54 MIL/uL (ref 4.22–5.81)
RDW: 14.4 % (ref 11.5–15.5)
WBC Count: 8.1 10*3/uL (ref 4.0–10.5)
nRBC: 0 % (ref 0.0–0.2)

## 2021-08-13 LAB — CMP (CANCER CENTER ONLY)
ALT: 10 U/L (ref 0–44)
AST: 14 U/L — ABNORMAL LOW (ref 15–41)
Albumin: 3.7 g/dL (ref 3.5–5.0)
Alkaline Phosphatase: 122 U/L (ref 38–126)
Anion gap: 7 (ref 5–15)
BUN: 10 mg/dL (ref 8–23)
CO2: 25 mmol/L (ref 22–32)
Calcium: 9.8 mg/dL (ref 8.9–10.3)
Chloride: 108 mmol/L (ref 98–111)
Creatinine: 1.33 mg/dL — ABNORMAL HIGH (ref 0.61–1.24)
GFR, Estimated: 55 mL/min — ABNORMAL LOW (ref >60)
Glucose, Bld: 201 mg/dL — ABNORMAL HIGH (ref 70–99)
Potassium: 4.6 mmol/L (ref 3.5–5.1)
Sodium: 140 mmol/L (ref 135–145)
Total Bilirubin: 0.3 mg/dL (ref 0.3–1.2)
Total Protein: 7.2 g/dL (ref 6.5–8.1)

## 2021-08-13 MED ORDER — SODIUM CHLORIDE 0.9 % IV SOLN
490.0000 mg | Freq: Once | INTRAVENOUS | Status: AC
  Administered 2021-08-13: 490 mg via INTRAVENOUS
  Filled 2021-08-13: qty 49

## 2021-08-13 MED ORDER — SODIUM CHLORIDE 0.9 % IV SOLN
10.0000 mg | Freq: Once | INTRAVENOUS | Status: AC
  Administered 2021-08-13: 10 mg via INTRAVENOUS
  Filled 2021-08-13: qty 10

## 2021-08-13 MED ORDER — PALONOSETRON HCL INJECTION 0.25 MG/5ML
0.2500 mg | Freq: Once | INTRAVENOUS | Status: AC
  Administered 2021-08-13: 0.25 mg via INTRAVENOUS
  Filled 2021-08-13: qty 5

## 2021-08-13 MED ORDER — SODIUM CHLORIDE 0.9 % IV SOLN
150.0000 mg | Freq: Once | INTRAVENOUS | Status: AC
  Administered 2021-08-13: 150 mg via INTRAVENOUS
  Filled 2021-08-13: qty 150

## 2021-08-13 MED ORDER — SODIUM CHLORIDE 0.9 % IV SOLN
360.0000 mg | Freq: Once | INTRAVENOUS | Status: AC
  Administered 2021-08-13: 360 mg via INTRAVENOUS
  Filled 2021-08-13: qty 24

## 2021-08-13 MED ORDER — SODIUM CHLORIDE 0.9 % IV SOLN
500.0000 mg/m2 | Freq: Once | INTRAVENOUS | Status: AC
  Administered 2021-08-13: 1100 mg via INTRAVENOUS
  Filled 2021-08-13: qty 40

## 2021-08-13 MED ORDER — SODIUM CHLORIDE 0.9 % IV SOLN: Freq: Once | INTRAVENOUS | Status: AC

## 2021-08-13 NOTE — Progress Notes (Signed)
OK to adj Carboplatin dose today w/ most recent wt and SCr per Dr. Julien Nordmann. Carbo dose changed from 440 mg up to 490 mg accordingly.  Kennith Center, Pharm.D., CPP 08/13/2021@12 :48 PM

## 2021-08-13 NOTE — Patient Instructions (Signed)
[facility]  Discharge Instructions: Thank you for choosing [facility] to provide your oncology and hematology care.   If you have a lab appointment with the [name], please go directly to the [facility] and check in at the registration area.   Wear comfortable clothing and clothing appropriate for easy access to any Portacath or PICC line.   We strive to give you quality time with your provider. You may need to reschedule your appointment if you arrive late (15 or more minutes).  Arriving late affects you and other patients whose appointments are after yours.  Also, if you miss three or more appointments without notifying the office, you may be dismissed from the clinic at the provider's discretion.      For prescription refill requests, have your pharmacy contact our office and allow 72 hours for refills to be completed.    Today you received the following chemotherapy and/or immunotherapy agents Nivolumab (Opdivo), Pemetrexed (Alimta), and Carboplatin.      To help prevent nausea and vomiting after your treatment, we encourage you to take your nausea medication as directed.  BELOW ARE SYMPTOMS THAT SHOULD BE REPORTED IMMEDIATELY: *FEVER GREATER THAN 100.4 F (38 C) OR HIGHER *CHILLS OR SWEATING *NAUSEA AND VOMITING THAT IS NOT CONTROLLED WITH YOUR NAUSEA MEDICATION *UNUSUAL SHORTNESS OF BREATH *UNUSUAL BRUISING OR BLEEDING *URINARY PROBLEMS (pain or burning when urinating, or frequent urination) *BOWEL PROBLEMS (unusual diarrhea, constipation, pain near the anus) TENDERNESS IN MOUTH AND THROAT WITH OR WITHOUT PRESENCE OF ULCERS (sore throat, sores in mouth, or a toothache) UNUSUAL RASH, SWELLING OR PAIN  UNUSUAL VAGINAL DISCHARGE OR ITCHING   Items with * indicate a potential emergency and should be followed up as soon as possible or go to the Emergency Department if any problems should occur.  Please show the CHEMOTHERAPY ALERT  CARD or IMMUNOTHERAPY ALERT CARD at check-in to the Emergency Department and triage nurse.  Should you have questions after your visit or need to cancel or reschedule your appointment, please contact [facility]  Dept: [phone]  and follow the prompts.  Office hours are 8:00 a.m. to 4:30 p.m. Monday - Friday. Please note that voicemails left after 4:00 p.m. may not be returned until the following business day.  We are closed weekends and major holidays. You have access to a nurse at all times for urgent questions. Please call the main number to the clinic Dept: [phone] and follow the prompts.   For any non-urgent questions, you may also contact your provider using MyChart. We now offer e-Visits for anyone 80 and older to request care online for non-urgent symptoms. For details visit [email or web address].   Also download the MyChart app! Go to the app store, search "MyChart", open the app, select [facility], and log in with your MyChart username and password.  Due to Covid, a mask is required upon entering the hospital/clinic. If you do not have a mask, one will be given to you upon arrival. For doctor visits, patients may have 1 support person aged 70 or older with them. For treatment visits, patients cannot have anyone with them due to current Covid guidelines and our immunocompromised population.   Nivolumab injection What is this medication? NIVOLUMAB (nye VOL ue mab) is a monoclonal antibody. It treats certain types of cancer. Some of the cancers treated are colon cancer, head and neck cancer,Hodgkin lymphoma, lung cancer, and melanoma. This medicine may be used for  other purposes; ask your health care provider orpharmacist if you have questions. COMMON BRAND NAME(S): Opdivo What should I tell my care team before I take this medication? They need to know if you have any of these conditions: autoimmune diseases like Crohn's disease, ulcerative colitis,  or lupus have had or planning to have an allogeneic stem cell transplant (uses someone else's stem cells) history of chest radiation history of organ transplant nervous system problems like myasthenia gravis or Guillain-Barre syndrome an unusual or allergic reaction to nivolumab, other medicines, foods, dyes, or preservatives pregnant or trying to get pregnant breast-feeding How should I use this medication? This medicine is for infusion into a vein. It is given by a health careprofessional in a hospital or clinic setting. A special MedGuide will be given to you before each treatment. Be sure to readthis information carefully each time. Talk to your pediatrician regarding the use of this medicine in children. While this drug may be prescribed for children as young as 12 years for selectedconditions, precautions do apply. Overdosage: If you think you have taken too much of this medicine contact apoison control center or emergency room at once. NOTE: This medicine is only for you. Do not share this medicine with others. What if I miss a dose? It is important not to miss your dose. Call your doctor or health careprofessional if you are unable to keep an appointment. What may interact with this medication? Interactions have not been studied. This list may not describe all possible interactions. Give your health care provider a list of all the medicines, herbs, non-prescription drugs, or dietary supplements you use. Also tell them if you smoke, drink alcohol, or use illegaldrugs. Some items may interact with your medicine. What should I watch for while using this medication? This drug may make you feel generally unwell. Continue your course of treatmenteven though you feel ill unless your doctor tells you to stop. You may need blood work done while you are taking this medicine. Do not become pregnant while taking this medicine or for 5 months after stopping it. Women should inform their doctor if  they wish to become pregnant or think they might be pregnant. There is a potential for serious side effects to an unborn child. Talk to your health care professional or pharmacist for more information. Do not breast-feed an infant while taking this medicine orfor 5 months after stopping it. What side effects may I notice from receiving this medication? Side effects that you should report to your doctor or health care professionalas soon as possible: allergic reactions like skin rash, itching or hives, swelling of the face, lips, or tongue breathing problems blood in the urine bloody or watery diarrhea or black, tarry stools changes in emotions or moods changes in vision chest pain cough dizziness feeling faint or lightheaded, falls fever, chills headache with fever, neck stiffness, confusion, loss of memory, sensitivity to light, hallucination, loss of contact with reality, or seizures joint pain mouth sores redness, blistering, peeling or loosening of the skin, including inside the mouth severe muscle pain or weakness signs and symptoms of high blood sugar such as dizziness; dry mouth; dry skin; fruity breath; nausea; stomach pain; increased hunger or thirst; increased urination signs and symptoms of kidney injury like trouble passing urine or change in the amount of urine signs and symptoms of liver injury like dark yellow or brown urine; general ill feeling or flu-like symptoms; light-colored stools; loss of appetite; nausea; right upper belly pain; unusually weak  or tired; yellowing of the eyes or skin swelling of the ankles, feet, hands trouble passing urine or change in the amount of urine unusually weak or tired weight gain or loss Side effects that usually do not require medical attention (report to yourdoctor or health care professional if they continue or are bothersome): bone pain constipation decreased appetite diarrhea muscle pain nausea, vomiting tiredness This list  may not describe all possible side effects. Call your doctor for medical advice about side effects. You may report side effects to FDA at1-800-FDA-1088. Where should I keep my medication? This drug is given in a hospital or clinic and will not be stored at home. NOTE: This sheet is a summary. It may not cover all possible information. If you have questions about this medicine, talk to your doctor, pharmacist, orhealth care provider.  2022 Elsevier/Gold Standard (2020-04-18 10:08:25)  Pemetrexed injection What is this medication? PEMETREXED (PEM e TREX ed) is a chemotherapy drug used to treat lung cancers like non-small cell lung cancer and mesothelioma. It may also be used to treatother cancers. This medicine may be used for other purposes; ask your health care provider orpharmacist if you have questions. COMMON BRAND NAME(S): Alimta What should I tell my care team before I take this medication? They need to know if you have any of these conditions: infection (especially a virus infection such as chickenpox, cold sores, or herpes) kidney disease low blood counts, like low white cell, platelet, or red cell counts lung or breathing disease, like asthma radiation therapy an unusual or allergic reaction to pemetrexed, other medicines, foods, dyes, or preservative pregnant or trying to get pregnant breast-feeding How should I use this medication? This drug is given as an infusion into a vein. It is administered in a hospitalor clinic by a specially trained health care professional. Talk to your pediatrician regarding the use of this medicine in children.Special care may be needed. Overdosage: If you think you have taken too much of this medicine contact apoison control center or emergency room at once. NOTE: This medicine is only for you. Do not share this medicine with others. What if I miss a dose? It is important not to miss your dose. Call your doctor or health careprofessional if you are  unable to keep an appointment. What may interact with this medication? This medicine may interact with the following medications: Ibuprofen This list may not describe all possible interactions. Give your health care provider a list of all the medicines, herbs, non-prescription drugs, or dietary supplements you use. Also tell them if you smoke, drink alcohol, or use illegaldrugs. Some items may interact with your medicine. What should I watch for while using this medication? Visit your doctor for checks on your progress. This drug may make you feel generally unwell. This is not uncommon, as chemotherapy can affect healthy cells as well as cancer cells. Report any side effects. Continue your course oftreatment even though you feel ill unless your doctor tells you to stop. In some cases, you may be given additional medicines to help with side effects.Follow all directions for their use. Call your doctor or health care professional for advice if you get a fever, chills or sore throat, or other symptoms of a cold or flu. Do not treat yourself. This drug decreases your body's ability to fight infections. Try toavoid being around people who are sick. This medicine may increase your risk to bruise or bleed. Call your doctor orhealth care professional if you notice any unusual  bleeding. Be careful brushing and flossing your teeth or using a toothpick because you may get an infection or bleed more easily. If you have any dental work done,tell your dentist you are receiving this medicine. Avoid taking products that contain aspirin, acetaminophen, ibuprofen, naproxen, or ketoprofen unless instructed by your doctor. These medicines may hide afever. Call your doctor or health care professional if you get diarrhea or mouthsores. Do not treat yourself. To protect your kidneys, drink water or other fluids as directed while you aretaking this medicine. Do not become pregnant while taking this medicine or for 6 months  after stopping it. Women should inform their doctor if they wish to become pregnant or think they might be pregnant. Men should not father a child while taking this medicine and for 3 months after stopping it. This may interfere with the ability to father a child. You should talk to your doctor or health care professional if you are concerned about your fertility. There is a potential for serious side effects to an unborn child. Talk to your health care professional or pharmacist for more information. Do not breast-feed an infantwhile taking this medicine or for 1 week after stopping it. What side effects may I notice from receiving this medication? Side effects that you should report to your doctor or health care professionalas soon as possible: allergic reactions like skin rash, itching or hives, swelling of the face, lips, or tongue breathing problems redness, blistering, peeling or loosening of the skin, including inside the mouth signs and symptoms of bleeding such as bloody or black, tarry stools; red or dark-brown urine; spitting up blood or brown material that looks like coffee grounds; red spots on the skin; unusual bruising or bleeding from the eye, gums, or nose signs and symptoms of infection like fever or chills; cough; sore throat; pain or trouble passing urine signs and symptoms of kidney injury like trouble passing urine or change in the amount of urine signs and symptoms of liver injury like dark yellow or brown urine; general ill feeling or flu-like symptoms; light-colored stools; loss of appetite; nausea; right upper belly pain; unusually weak or tired; yellowing of the eyes or skin Side effects that usually do not require medical attention (report to yourdoctor or health care professional if they continue or are bothersome): constipation mouth sores nausea, vomiting unusually weak or tired This list may not describe all possible side effects. Call your doctor for medical advice  about side effects. You may report side effects to FDA at1-800-FDA-1088. Where should I keep my medication? This drug is given in a hospital or clinic and will not be stored at home. NOTE: This sheet is a summary. It may not cover all possible information. If you have questions about this medicine, talk to your doctor, pharmacist, orhealth care provider.  2022 Elsevier/Gold Standard (2018-02-03 16:11:33)  Carboplatin injection What is this medication? CARBOPLATIN (KAR boe pla tin) is a chemotherapy drug. It targets fast dividing cells, like cancer cells, and causes these cells to die. This medicine is usedto treat ovarian cancer and many other cancers. This medicine may be used for other purposes; ask your health care provider orpharmacist if you have questions. COMMON BRAND NAME(S): Paraplatin What should I tell my care team before I take this medication? They need to know if you have any of these conditions: blood disorders hearing problems kidney disease recent or ongoing radiation therapy an unusual or allergic reaction to carboplatin, cisplatin, other chemotherapy, other medicines, foods, dyes, or  preservatives pregnant or trying to get pregnant breast-feeding How should I use this medication? This drug is usually given as an infusion into a vein. It is administered in [facility] or clinic by a specially trained health care professional. Talk to your pediatrician regarding the use of this medicine in children.Special care may be needed. Overdosage: If you think you have taken too much of this medicine contact apoison control center or emergency room at once. NOTE: This medicine is only for you. Do not share this medicine with others. What if I miss a dose? It is important not to miss a dose. Call your doctor or health careprofessional if you are unable to keep an appointment. What may interact with this medication? medicines for seizures medicines to increase blood counts like  filgrastim, pegfilgrastim, sargramostim some antibiotics like amikacin, gentamicin, neomycin, streptomycin, tobramycin vaccines Talk to your doctor or health care professional before taking any of thesemedicines: acetaminophen aspirin ibuprofen ketoprofen naproxen This list may not describe all possible interactions. Give your health care provider a list of all the medicines, herbs, non-prescription drugs, or dietary supplements you use. Also tell them if you smoke, drink alcohol, or use illegaldrugs. Some items may interact with your medicine. What should I watch for while using this medication? Your condition will be monitored carefully while you are receiving this medicine. You will need important blood work done while you are taking thismedicine. This drug may make you feel generally unwell. This is not uncommon, as chemotherapy can affect healthy cells as well as cancer cells. Report any side effects. Continue your course of treatment even though you feel ill unless yourdoctor tells you to stop. In some cases, you may be given additional medicines to help with side effects.Follow all directions for their use. Call your doctor or health care professional for advice if you get a fever, chills or sore throat, or other symptoms of a cold or flu. Do not treat yourself. This drug decreases your body's ability to fight infections. Try toavoid being around people who are sick. This medicine may increase your risk to bruise or bleed. Call your doctor orhealth care professional if you notice any unusual bleeding. Be careful brushing and flossing your teeth or using a toothpick because you may get an infection or bleed more easily. If you have any dental work done,tell your dentist you are receiving this medicine. Avoid taking products that contain aspirin, acetaminophen, ibuprofen, naproxen, or ketoprofen unless instructed by your doctor. These medicines may hide afever. Do not become pregnant while  taking this medicine. Women should inform their doctor if they wish to become pregnant or think they might be pregnant. There is a potential for serious side effects to an unborn child. Talk to your health care professional or pharmacist for more information. Do not breast-feed aninfant while taking this medicine. What side effects may I notice from receiving this medication? Side effects that you should report to your doctor or health care professionalas soon as possible: allergic reactions like skin rash, itching or hives, swelling of the face, lips, or tongue signs of infection - fever or chills, cough, sore throat, pain or difficulty passing urine signs of decreased platelets or bleeding - bruising, pinpoint red spots on the skin, black, tarry stools, nosebleeds signs of decreased red blood cells - unusually weak or tired, fainting spells, lightheadedness breathing problems changes in hearing changes in vision chest pain high blood pressure low blood counts - This drug may decrease the number of white blood  cells, red blood cells and platelets. You may be at increased risk for infections and bleeding. nausea and vomiting pain, swelling, redness or irritation at the injection site pain, tingling, numbness in the hands or feet problems with balance, talking, walking trouble passing urine or change in the amount of urine Side effects that usually do not require medical attention (report to yourdoctor or health care professional if they continue or are bothersome): hair loss loss of appetite metallic taste in the mouth or changes in taste This list may not describe all possible side effects. Call your doctor for medical advice about side effects. You may report side effects to FDA at1-800-FDA-1088. Where should I keep my medication? This drug is given in a hospital or clinic and will not be stored at home. NOTE: This sheet is a summary. It may not cover all possible information. If you have  questions about this medicine, talk to your doctor, pharmacist, orhealth care provider.  2022 Elsevier/Gold Standard (2008-03-21 14:38:05)  Managing Cancer-Related Fatigue Fatigue, or feeling very tired, is a common problem when you have cancer. Fatigue comes from the cancer itself and the treatments that you are given, such as chemotherapy and radiation. It can make you feel weak and drained of energy even after a small amount of activity. Unlike the fatigue that healthy people feel, cancer-related fatigue may not go away with sleep or rest. Fatigue is usually worse during treatment, but survivors may continue to feel some fatigue after treatment ends. There are several things you can do to helpmanage your fatigue. How to prevent cancer-related fatigue Cancer treatment, low blood counts, appetite changes, and changes in sleep routines can all cause fatigue. Here are some ways to reduce or prevent fatigue: Eat healthy and drink enough fluid to stay hydrated. Talk with a nutritionist or dietitian who can help you plan the best diet for you. Stay active and get regular exercise. Check with your cancer care team first to make sure this is safe. Get plenty of rest and take it easy. Try taking a few short naps or rest breaks throughout the day. Tell your health care provider about any cancer-related side effects, including fatigue. He or she may be able to adjust your medicines or treatment to reduce your fatigue. Follow these instructions at home: Lifestyle  Exercise regularly. Aim for 30 minutes of moderate exercise 5 times a week, or 60 minutes 3 times a week. Examples of moderate exercise include walking, biking, or swimming. Ask your health care provider what activities are safe for you. Remember to pace yourself and take short rest breaks. Find an activity or hobby that you enjoy, and spend 20-30 minutes on it a few times a week. This can help improve your mood, your focus, and your overall  mental health. Get plenty of rest and keep a regular sleep routine. Go to bed at the same time every night. Only use your bed for sleep. Avoid noise during sleep. Take short naps or rest breaks during the day. Do not nap for more than 20-30 minutes. Try to lower your stress. If you need help with this, talk with your health care provider about ways to manage stress. Here are some things you can try: Do mind and body exercises such as meditation, yoga, tai chi, or qigong. Write in a journal. Make a to-do list and prioritize only the important tasks.  Eating and drinking  Eat a healthy diet that includes fresh fruits and vegetables, lean proteins, healthy fats, low-fat  dairy products, and whole grains. Avoid simple, refined sugars, such as soft drinks and sweets. Try eating small meals and snacks throughout the day. A piece of fruit and some nuts are good choices for snacking. Try to drink at least 8 glasses of water every day. Limit the amount of alcohol and caffeine that you drink.  Medicines Talk with your health care provider about prescription medicines and over-the-counter vitamins and supplements that you take. Where to find support Your health care provider or cancer care team will be able to give you advice about how to cope with fatigue. They can also guide you in making health andlifestyle changes that may help you feel less tired. Your health care provider may also refer you to a therapist for counseling. Talking with a therapist may help you reduce fatigue by working through [employer] or fears that you may have. Where to find more information There are many trusted resources that can help you learn more about cancer and its side effects. Ask your health care provider what he or she recommends. You can start with: The American Cancer Society: www.cancer.org The American Society of Clinical Oncology: www.cancer.net [employer]: www.cancer.gov Contact a health  care provider if: You feel depressed. Your fatigue gets worse or does not go away with sleep or rest. You are unable to sleep at night. You are thinking about changing your medicines or treatment. You would like to make changes to your diet or are thinking about taking dietary supplements. Get help right away if: You feel short of breath, feel dizzy, or have a racing heartbeat after little activity. You become confused or you are not able to think clearly. Summary Fatigue is the most common side effect of cancer treatments like chemotherapy and radiation. Unlike the fatigue that healthy people feel, cancer-related fatigue may not go away with sleep and rest alone. There are several things you can do to help manage your fatigue, including eating a healthy diet, getting exercise, and keeping a regular sleep routine. Your health care provider or cancer care team will be able to give you advice about how to cope with fatigue. They can also guide you in making health and lifestyle changes that may help you feel less tired. This information is not intended to replace advice given to you by your health care provider. Make sure you discuss any questions you have with your healthcare provider. Document Revised: 11/17/2019 Document Reviewed: 11/17/2019 Elsevier Patient Education  2022 [employer].  Rehydration, Adult Rehydration is the replacement of body fluids, salts, and minerals (electrolytes) that are lost during dehydration. Dehydration is when there is not enough water or other fluids in the body. This happens when you lose more fluids than you take in. Common causes of dehydration include: Not drinking enough fluids. This can occur when you are ill or doing activities that require a lot of energy, especially in hot weather. Conditions that cause loss of water or other fluids, such as diarrhea, vomiting, sweating, or urinating a lot. Other illnesses, such as fever or infection. Certain  medicines, such as those that remove excess fluid from the body (diuretics). Symptoms of mild or moderate dehydration may include thirst, dry lips and mouth, and dizziness. Symptoms of severe dehydration may include increasedheart rate, confusion, fainting, and not urinating. For severe dehydration, you may need to get fluids through an IV at the hospital. For mild or moderate dehydration, you can usually rehydrate at homeby drinking certain fluids as told by your  health care provider. What are the risks? Generally, rehydration is safe. However, taking in too much fluid (overhydration) can be a problem. This is rare. Overhydration can cause an electrolyte imbalance, kidney failure, or a decrease in salt (sodium) levels in the body. Supplies needed You will need an oral rehydration solution (ORS) if your health care provider tells you to use one. This is a drink to treat dehydration. It can be found inpharmacies and retail stores. How to rehydrate Fluids Follow instructions from your health care provider for rehydration. The kind of fluid and the amount you should drink depend on your condition. In general, you should choose drinks that you prefer. If told by your health care provider, drink an ORS. Make an ORS by following instructions on the package. Start by drinking small amounts, about  cup (120 mL) every 5-10 minutes. Slowly increase how much you drink until you have taken the amount recommended by your health care provider. Drink enough clear fluids to keep your urine pale yellow. If you were told to drink an ORS, finish it first, then start slowly drinking other clear fluids. Drink fluids such as: Water. This includes sparkling water and flavored water. Drinking only water can lead to having too little sodium in your body (hyponatremia). Follow the advice of your health care provider. Water from ice chips you suck on. Fruit juice with water you add to it (diluted). Sports drinks. Hot or  cold herbal teas. Broth-based soups. Milk or milk products. Food Follow instructions from your health care provider about what to eat while you rehydrate. Your health care provider may recommend that you slowly begin eating regular foods in small amounts. Eat foods that contain a healthy balance of electrolytes, such as bananas, oranges, potatoes, tomatoes, and spinach. Avoid foods that are greasy or contain a lot of sugar. In some cases, you may get nutrition through a feeding tube that is passed through your nose and into your stomach (nasogastric tube, or NG tube). This may be done if you have uncontrolled vomiting or diarrhea. Beverages to avoid  Certain beverages may make dehydration worse. While you rehydrate, avoiddrinking alcohol. How to tell if you are recovering from dehydration You may be recovering from dehydration if: You are urinating more often than before you started rehydrating. Your urine is pale yellow. Your energy level improves. You vomit less frequently. You have diarrhea less frequently. Your appetite improves or returns to normal. You feel less dizzy or less light-headed. Your skin tone and color start to look more normal. Follow these instructions at home: Take over-the-counter and prescription medicines only as told by your health care provider. Do not take sodium tablets. Doing this can lead to having too much sodium in your body (hypernatremia). Contact a health care provider if: You continue to have symptoms of mild or moderate dehydration, such as: Thirst. Dry lips. Slightly dry mouth. Dizziness. Dark urine or less urine than normal. Muscle cramps. You continue to vomit or have diarrhea. Get help right away if you: Have symptoms of dehydration that get worse. Have a fever. Have a severe headache. Have been vomiting and the following happens: Your vomiting gets worse or does not go away. Your vomit includes blood or green matter (bile). You cannot  eat or drink without vomiting. Have problems with urination or bowel movements, such as: Diarrhea that gets worse or does not go away. Blood in your stool (feces). This may cause stool to look black and tarry. Not urinating, or urinating  only a small amount of very dark urine, within 6-8 hours. Have trouble breathing. Have symptoms that get worse with treatment. These symptoms may represent a serious problem that is an emergency. Do not wait to see if the symptoms will go away. Get medical help right away. Call your local emergency services (911 in the [place] Do not drive yourself to the hospital. Summary Rehydration is the replacement of body fluids and minerals (electrolytes) that are lost during dehydration. Follow instructions from your health care provider for rehydration. The kind of fluid and amount you should drink depend on your condition. Slowly increase how much you drink until you have taken the amount recommended by your health care provider. Contact your health care provider if you continue to show signs of mild or moderate dehydration. This information is not intended to replace advice given to you by your health care provider. Make sure you discuss any questions you have with your healthcare provider. Document Revised: 02/15/2020 Document Reviewed: 12/26/2019 Elsevier Patient Education  2022 [employer].

## 2021-08-13 NOTE — Progress Notes (Signed)
[facility] [phone]   Fax:([phone]) [place] [address]  DIAGNOSIS: Stage IIIB (T3b, N2, M0) non-small cell lung cancer, adenocarcinoma presented with large right lower lobe lung mass and suspicious right lower paratracheal lymphadenopathy diagnosed in June 2022.  Molecular studies by [facility] [employer] showed no actionable mutations  PRIOR THERAPY: None.  CURRENT THERAPY: Neoadjuvant systemic chemotherapy according to the Checkmate 816 with carboplatin for AUC of 5, Alimta 500 Mg/M2 and nivolumab 360 Mg IV every 3 weeks for 3 cycles.  First dose August 06, 2021.  INTERVAL HISTORY: Paul [name] 75 y.o. male returns to the clinic today for follow-up visit.  The patient is feeling fine today with no concerning complaints except for mild cough.  He denied having any chest pain, shortness of breath or hemoptysis.  He denied having any weight loss or night sweats.  He has no nausea, vomiting, diarrhea or constipation.  He has no headache or visual changes.  He was supposed to have MRI of the brain before this visit but unfortunately we have not received the preauthorization from the [place] yet.  The patient is here today for evaluation before starting the first cycle of neoadjuvant systemic chemotherapy.  MEDICAL HISTORY: Past Medical History:  Diagnosis Date   CHF (congestive heart failure) ([facility])    Coronary artery disease    Diabetes mellitus without complication ([place] [facility])    Hypertension    Hypothyroidism    Ischemic cardiomyopathy    Pneumonia    a long time ago   Sleep apnea    no longer uses a cpap    ALLERGIES:  has No Known Allergies.  MEDICATIONS:  Current Outpatient Medications  Medication Sig Dispense Refill   aspirin 81 MG chewable tablet Take 81 mg by mouth daily.     blood glucose meter kit and supplies KIT Dispense based on patient and insurance  preference. Use up to four times daily as directed. 1 each 0   carvedilol (COREG) 25 MG tablet Take 25 mg by mouth at bedtime.     folic acid (FOLVITE) 1 MG tablet Take 1 tablet (1 mg total) by mouth daily. 30 tablet 4   furosemide (LASIX) 20 MG tablet Take 20 mg by mouth at bedtime.     insulin glargine (LANTUS) 100 UNIT/ML Solostar Pen Inject 10 Units into the skin daily. 5 mL 5   levothyroxine (SYNTHROID) 25 MCG tablet Take 25 mcg by mouth at bedtime.     Menthol-Methyl Salicylate (MUSCLE RUB) 10-15 % CREA Apply 1 application topically daily as needed for muscle pain.     metFORMIN (GLUCOPHAGE) 500 MG tablet Take 1 tablet (500 mg total) by mouth 2 (two) times daily with a meal. 60 tablet 3   Multiple Vitamins-Minerals (MULTIVITAMIN WITH MINERALS) tablet Take 1 tablet by mouth daily.     nitroGLYCERIN (NITROSTAT) 0.4 MG SL tablet Place 0.4 mg under the tongue every 5 (five) minutes as needed for chest pain.     omeprazole (PRILOSEC) 20 MG capsule Take 20 mg by mouth daily.     prochlorperazine (COMPAZINE) 10 MG tablet Take 1 tablet (10 mg total) by mouth every 6 (six) hours as needed for nausea or vomiting. 30 tablet 0   sacubitril-valsartan (ENTRESTO) 49-51 MG Take 0.5 tablets by mouth 2 (two) times daily.     No current facility-administered medications for this visit.    SURGICAL HISTORY:  Past Surgical History:  Procedure Laterality Date   BRONCHIAL BIOPSY  06/04/2021   Procedure: BRONCHIAL BIOPSIES;  Surgeon: Garner Nash, DO;  Location: [facility];  Service: Pulmonary;;   BRONCHIAL BRUSHINGS  06/04/2021   Procedure: BRONCHIAL BRUSHINGS;  Surgeon: Garner Nash, DO;  Location: [facility];  Service: Pulmonary;;   BRONCHIAL NEEDLE ASPIRATION BIOPSY  06/04/2021   Procedure: BRONCHIAL NEEDLE ASPIRATION BIOPSIES;  Surgeon: Garner Nash, DO;  Location: [facility];  Service: Pulmonary;;   BRONCHIAL WASHINGS  06/04/2021   Procedure: BRONCHIAL WASHINGS;  Surgeon: Garner Nash,  DO;  Location: [facility];  Service: Pulmonary;;   CARDIAC SURGERY     COLONOSCOPY     CORONARY ARTERY BYPASS GRAFT     2019 at [facility] Right 06/04/2021   Procedure: [place];  Surgeon: Garner Nash, DO;  Location: [facility];  Service: Pulmonary;  Laterality: Right;   VIDEO BRONCHOSCOPY WITH ENDOBRONCHIAL ULTRASOUND  06/04/2021   Procedure: VIDEO BRONCHOSCOPY WITH ENDOBRONCHIAL ULTRASOUND;  Surgeon: Garner Nash, DO;  Location: [facility];  Service: Pulmonary;;    REVIEW OF SYSTEMS:  A comprehensive review of systems was negative except for: Respiratory: positive for cough   PHYSICAL EXAMINATION: General appearance: alert, cooperative, fatigued, and no distress Head: Normocephalic, without obvious abnormality, atraumatic Neck: no adenopathy, no JVD, supple, symmetrical, trachea midline, and thyroid not enlarged, symmetric, no tenderness/mass/nodules Lymph nodes: Cervical, supraclavicular, and axillary nodes normal. Resp: clear to auscultation bilaterally Back: symmetric, no curvature. ROM normal. No CVA tenderness. Cardio: regular rate and rhythm, S1, S2 normal, no murmur, click, rub or gallop GI: soft, non-tender; bowel sounds normal; no masses,  no organomegaly Extremities: extremities normal, atraumatic, no cyanosis or edema  ECOG PERFORMANCE STATUS: 1 - Symptomatic but completely ambulatory  Blood pressure (!) 141/69, pulse 77, temperature 98.3 F (36.8 C), temperature source Oral, resp. rate 19, height _0  (1.727 m), weight 246 lb 3.2 oz (111.7 kg), SpO2 99 %.  LABORATORY DATA: Lab Results  Component Value Date   WBC 8.1 08/13/2021   HGB 14.0 08/13/2021   HCT 43.1 08/13/2021   MCV 94.9 08/13/2021   PLT 249 08/13/2021      Chemistry      Component Value Date/Time   NA 139 07/30/2021 1111   K 4.2 07/30/2021 1111   CL 105 07/30/2021 1111   CO2 27 07/30/2021 1111    BUN 12 07/30/2021 1111   CREATININE 1.48 (H) 07/30/2021 1111      Component Value Date/Time   CALCIUM 9.7 07/30/2021 1111   ALKPHOS 116 07/30/2021 1111   AST 12 (L) 07/30/2021 1111   ALT 8 07/30/2021 1111   BILITOT 0.4 07/30/2021 1111       RADIOGRAPHIC STUDIES: No results found.  ASSESSMENT AND PLAN: This is a very pleasant 75 years old African-American male diagnosed with a stage IIIb (T3b, N2, M0) non-small cell lung cancer, adenocarcinoma presented with large right lower lobe lung mass and suspicious right lower lobe paratracheal lymphadenopathy diagnosed in June 2022. Has MRI of the brain is still pending. Molecular studies by [facility] 360 showed no actionable mutations. The patient is here today for evaluation before starting the first cycle of neoadjuvant chemotherapy according to the Checkmate 816 with carboplatin for AUC of 5, Alimta 500 Mg/M2 and nivolumab 360 Mg IV every 3 weeks.  The patient is feeling fine and I recommended for him to proceed with the treatment  as planned. I will see him back for follow-up visit in 3 weeks for evaluation before starting cycle #2. The patient was advised to call immediately if he has any other concerning symptoms in the interval. The patient voices understanding of current disease status and treatment options and is in agreement with the current care plan.  All questions were answered. The patient knows to call the clinic with any problems, questions or concerns. We can certainly see the patient much sooner if necessary.   Disclaimer: This note was dictated with voice recognition software. Similar sounding words can inadvertently be transcribed and may not be corrected upon review.

## 2021-08-13 NOTE — Telephone Encounter (Signed)
I contacted referral coordinators for [facility].  They are unclear as to if Mr. Paul [name] is auth or not. I called [facility], [name] for clarification. I was unable to reach but did leave vm message with my name and phone number to call.

## 2021-08-13 NOTE — Progress Notes (Signed)
I spoke with Paul [name] today.  His his having his first treatment today.  I noticed that he did not get his MRI brain completed and is not authorized.  I reached out to the insurance auth coordinator to get authed.

## 2021-08-16 ENCOUNTER — Telehealth: Payer: Self-pay | Admitting: *Deleted

## 2021-08-16 ENCOUNTER — Encounter (HOSPITAL_COMMUNITY): Payer: Self-pay

## 2021-08-16 NOTE — Telephone Encounter (Signed)
I have not heard back from the [place] so I called Ms. Shannon with the [facility] but was unable to reach her. I did leave a vm message with my name and phone number to call due to needing clarification on referral for MRI brain.

## 2021-08-20 ENCOUNTER — Other Ambulatory Visit: Payer: Self-pay

## 2021-08-20 ENCOUNTER — Inpatient Hospital Stay: Payer: No Typology Code available for payment source

## 2021-08-20 ENCOUNTER — Encounter: Payer: Self-pay | Admitting: Internal Medicine

## 2021-08-20 DIAGNOSIS — C3491 Malignant neoplasm of unspecified part of right bronchus or lung: Secondary | ICD-10-CM

## 2021-08-20 DIAGNOSIS — Z5112 Encounter for antineoplastic immunotherapy: Secondary | ICD-10-CM | POA: Diagnosis not present

## 2021-08-20 LAB — CMP (CANCER CENTER ONLY)
ALT: 10 U/L (ref 0–44)
AST: 15 U/L (ref 15–41)
Albumin: 3.6 g/dL (ref 3.5–5.0)
Alkaline Phosphatase: 105 U/L (ref 38–126)
Anion gap: 7 (ref 5–15)
BUN: 18 mg/dL (ref 8–23)
CO2: 30 mmol/L (ref 22–32)
Calcium: 10 mg/dL (ref 8.9–10.3)
Chloride: 106 mmol/L (ref 98–111)
Creatinine: 1.21 mg/dL (ref 0.61–1.24)
GFR, Estimated: >60 mL/min (ref >60)
Glucose, Bld: 118 mg/dL — ABNORMAL HIGH (ref 70–99)
Potassium: 4.5 mmol/L (ref 3.5–5.1)
Sodium: 143 mmol/L (ref 135–145)
Total Bilirubin: 0.8 mg/dL (ref 0.3–1.2)
Total Protein: 7 g/dL (ref 6.5–8.1)

## 2021-08-20 LAB — CBC WITH DIFFERENTIAL (CANCER CENTER ONLY)
Abs Immature Granulocytes: 0.02 10*3/uL (ref 0.00–0.07)
Basophils Absolute: 0 10*3/uL (ref 0.0–0.1)
Basophils Relative: 1 %
Eosinophils Absolute: 0.2 10*3/uL (ref 0.0–0.5)
Eosinophils Relative: 6 %
HCT: 42.4 % (ref 39.0–52.0)
Hemoglobin: 14 g/dL (ref 13.0–17.0)
Immature Granulocytes: 1 %
Lymphocytes Relative: 34 %
Lymphs Abs: 1.1 10*3/uL (ref 0.7–4.0)
MCH: 31.4 pg (ref 26.0–34.0)
MCHC: 33 g/dL (ref 30.0–36.0)
MCV: 95.1 fL (ref 80.0–100.0)
Monocytes Absolute: 0.1 10*3/uL (ref 0.1–1.0)
Monocytes Relative: 4 %
Neutro Abs: 1.7 10*3/uL (ref 1.7–7.7)
Neutrophils Relative %: 54 %
Platelet Count: 189 10*3/uL (ref 150–400)
RBC: 4.46 MIL/uL (ref 4.22–5.81)
RDW: 13.5 % (ref 11.5–15.5)
WBC Count: 3.1 10*3/uL — ABNORMAL LOW (ref 4.0–10.5)
nRBC: 0 % (ref 0.0–0.2)

## 2021-08-20 NOTE — Progress Notes (Signed)
Met with patient at registration to introduce myself as Arboriculturist and to offer available resources.  Discussed one-time $1000 Radio broadcast assistant to assist with personal expenses while going through treatment. Based on verbal income, he exceeds the guidelines.  Gave him my card for any additional financial questions or concerns.

## 2021-08-21 LAB — TSH: TSH: 1.764 u[IU]/mL (ref 0.320–4.118)

## 2021-08-22 LAB — GUARDANT 360

## 2021-08-26 ENCOUNTER — Encounter: Payer: Self-pay | Admitting: *Deleted

## 2021-08-26 NOTE — Progress Notes (Signed)
I called the [place], [name] to check on authorization for MRI brain.

## 2021-08-26 NOTE — Progress Notes (Signed)
I contacted authorization team to see if [name] insurance will cover mri brain. Waiting to hear.

## 2021-08-27 ENCOUNTER — Other Ambulatory Visit: Payer: Self-pay

## 2021-08-27 ENCOUNTER — Inpatient Hospital Stay: Payer: No Typology Code available for payment source

## 2021-08-27 DIAGNOSIS — Z5112 Encounter for antineoplastic immunotherapy: Secondary | ICD-10-CM | POA: Diagnosis not present

## 2021-08-27 DIAGNOSIS — C3491 Malignant neoplasm of unspecified part of right bronchus or lung: Secondary | ICD-10-CM

## 2021-08-27 LAB — CBC WITH DIFFERENTIAL (CANCER CENTER ONLY)
Abs Immature Granulocytes: 0.02 10*3/uL (ref 0.00–0.07)
Basophils Absolute: 0 10*3/uL (ref 0.0–0.1)
Basophils Relative: 0 %
Eosinophils Absolute: 0.2 10*3/uL (ref 0.0–0.5)
Eosinophils Relative: 5 %
HCT: 39.8 % (ref 39.0–52.0)
Hemoglobin: 13.2 g/dL (ref 13.0–17.0)
Immature Granulocytes: 1 %
Lymphocytes Relative: 44 %
Lymphs Abs: 1.5 10*3/uL (ref 0.7–4.0)
MCH: 31.4 pg (ref 26.0–34.0)
MCHC: 33.2 g/dL (ref 30.0–36.0)
MCV: 94.5 fL (ref 80.0–100.0)
Monocytes Absolute: 0.4 10*3/uL (ref 0.1–1.0)
Monocytes Relative: 12 %
Neutro Abs: 1.3 10*3/uL — ABNORMAL LOW (ref 1.7–7.7)
Neutrophils Relative %: 38 %
Platelet Count: 134 10*3/uL — ABNORMAL LOW (ref 150–400)
RBC: 4.21 MIL/uL — ABNORMAL LOW (ref 4.22–5.81)
RDW: 13.5 % (ref 11.5–15.5)
WBC Count: 3.4 10*3/uL — ABNORMAL LOW (ref 4.0–10.5)
nRBC: 0 % (ref 0.0–0.2)

## 2021-08-27 LAB — CMP (CANCER CENTER ONLY)
ALT: 15 U/L (ref 0–44)
AST: 16 U/L (ref 15–41)
Albumin: 3.6 g/dL (ref 3.5–5.0)
Alkaline Phosphatase: 117 U/L (ref 38–126)
Anion gap: 7 (ref 5–15)
BUN: 9 mg/dL (ref 8–23)
CO2: 24 mmol/L (ref 22–32)
Calcium: 9.1 mg/dL (ref 8.9–10.3)
Chloride: 112 mmol/L — ABNORMAL HIGH (ref 98–111)
Creatinine: 1.25 mg/dL — ABNORMAL HIGH (ref 0.61–1.24)
GFR, Estimated: 59 mL/min — ABNORMAL LOW (ref >60)
Glucose, Bld: 189 mg/dL — ABNORMAL HIGH (ref 70–99)
Potassium: 4 mmol/L (ref 3.5–5.1)
Sodium: 143 mmol/L (ref 135–145)
Total Bilirubin: 0.4 mg/dL (ref 0.3–1.2)
Total Protein: 6.9 g/dL (ref 6.5–8.1)

## 2021-08-29 ENCOUNTER — Encounter (HOSPITAL_COMMUNITY): Payer: Self-pay | Admitting: Internal Medicine

## 2021-09-04 ENCOUNTER — Other Ambulatory Visit: Payer: Self-pay

## 2021-09-04 ENCOUNTER — Inpatient Hospital Stay: Payer: No Typology Code available for payment source

## 2021-09-04 ENCOUNTER — Inpatient Hospital Stay: Payer: No Typology Code available for payment source | Attending: Internal Medicine | Admitting: Internal Medicine

## 2021-09-04 VITALS — BP 162/82 | HR 83 | Temp 97.0°F | Resp 20 | Ht 68.0 in | Wt 242.7 lb

## 2021-09-04 DIAGNOSIS — Z7982 Long term (current) use of aspirin: Secondary | ICD-10-CM | POA: Insufficient documentation

## 2021-09-04 DIAGNOSIS — C3491 Malignant neoplasm of unspecified part of right bronchus or lung: Secondary | ICD-10-CM

## 2021-09-04 DIAGNOSIS — I110 Hypertensive heart disease with heart failure: Secondary | ICD-10-CM | POA: Insufficient documentation

## 2021-09-04 DIAGNOSIS — C3431 Malignant neoplasm of lower lobe, right bronchus or lung: Secondary | ICD-10-CM | POA: Diagnosis present

## 2021-09-04 DIAGNOSIS — I509 Heart failure, unspecified: Secondary | ICD-10-CM | POA: Diagnosis not present

## 2021-09-04 DIAGNOSIS — E119 Type 2 diabetes mellitus without complications: Secondary | ICD-10-CM | POA: Diagnosis not present

## 2021-09-04 DIAGNOSIS — Z5111 Encounter for antineoplastic chemotherapy: Secondary | ICD-10-CM | POA: Insufficient documentation

## 2021-09-04 DIAGNOSIS — E039 Hypothyroidism, unspecified: Secondary | ICD-10-CM | POA: Insufficient documentation

## 2021-09-04 DIAGNOSIS — Z5112 Encounter for antineoplastic immunotherapy: Secondary | ICD-10-CM | POA: Insufficient documentation

## 2021-09-04 DIAGNOSIS — Z79899 Other long term (current) drug therapy: Secondary | ICD-10-CM | POA: Diagnosis not present

## 2021-09-04 DIAGNOSIS — Z794 Long term (current) use of insulin: Secondary | ICD-10-CM | POA: Insufficient documentation

## 2021-09-04 LAB — CBC WITH DIFFERENTIAL (CANCER CENTER ONLY)
Abs Immature Granulocytes: 0.06 10*3/uL (ref 0.00–0.07)
Basophils Absolute: 0 10*3/uL (ref 0.0–0.1)
Basophils Relative: 1 %
Eosinophils Absolute: 0.1 10*3/uL (ref 0.0–0.5)
Eosinophils Relative: 1 %
HCT: 38.8 % — ABNORMAL LOW (ref 39.0–52.0)
Hemoglobin: 13.2 g/dL (ref 13.0–17.0)
Immature Granulocytes: 1 %
Lymphocytes Relative: 29 %
Lymphs Abs: 2.1 10*3/uL (ref 0.7–4.0)
MCH: 31.7 pg (ref 26.0–34.0)
MCHC: 34 g/dL (ref 30.0–36.0)
MCV: 93 fL (ref 80.0–100.0)
Monocytes Absolute: 0.9 10*3/uL (ref 0.1–1.0)
Monocytes Relative: 13 %
Neutro Abs: 4 10*3/uL (ref 1.7–7.7)
Neutrophils Relative %: 55 %
Platelet Count: 471 10*3/uL — ABNORMAL HIGH (ref 150–400)
RBC: 4.17 MIL/uL — ABNORMAL LOW (ref 4.22–5.81)
RDW: 14.6 % (ref 11.5–15.5)
WBC Count: 7.2 10*3/uL (ref 4.0–10.5)
nRBC: 0 % (ref 0.0–0.2)

## 2021-09-04 LAB — CMP (CANCER CENTER ONLY)
ALT: 12 U/L (ref 0–44)
AST: 21 U/L (ref 15–41)
Albumin: 3.6 g/dL (ref 3.5–5.0)
Alkaline Phosphatase: 151 U/L — ABNORMAL HIGH (ref 38–126)
Anion gap: 9 (ref 5–15)
BUN: 13 mg/dL (ref 8–23)
CO2: 24 mmol/L (ref 22–32)
Calcium: 9.5 mg/dL (ref 8.9–10.3)
Chloride: 109 mmol/L (ref 98–111)
Creatinine: 1.26 mg/dL — ABNORMAL HIGH (ref 0.61–1.24)
GFR, Estimated: 59 mL/min — ABNORMAL LOW (ref >60)
Glucose, Bld: 144 mg/dL — ABNORMAL HIGH (ref 70–99)
Potassium: 3.8 mmol/L (ref 3.5–5.1)
Sodium: 142 mmol/L (ref 135–145)
Total Bilirubin: 0.2 mg/dL — ABNORMAL LOW (ref 0.3–1.2)
Total Protein: 6.9 g/dL (ref 6.5–8.1)

## 2021-09-04 MED ORDER — SODIUM CHLORIDE 0.9 % IV SOLN
498.0000 mg | Freq: Once | INTRAVENOUS | Status: AC
  Administered 2021-09-04: 500 mg via INTRAVENOUS
  Filled 2021-09-04: qty 50

## 2021-09-04 MED ORDER — SODIUM CHLORIDE 0.9 % IV SOLN
10.0000 mg | Freq: Once | INTRAVENOUS | Status: AC
  Administered 2021-09-04: 10 mg via INTRAVENOUS
  Filled 2021-09-04: qty 10

## 2021-09-04 MED ORDER — FOSAPREPITANT DIMEGLUMINE INJECTION 150 MG
150.0000 mg | Freq: Once | INTRAVENOUS | Status: AC
  Administered 2021-09-04: 150 mg via INTRAVENOUS
  Filled 2021-09-04: qty 150

## 2021-09-04 MED ORDER — SODIUM CHLORIDE 0.9 % IV SOLN: Freq: Once | INTRAVENOUS | Status: AC

## 2021-09-04 MED ORDER — PALONOSETRON HCL INJECTION 0.25 MG/5ML
0.2500 mg | Freq: Once | INTRAVENOUS | Status: AC
Start: 2021-09-04 — End: 2021-09-04
  Administered 2021-09-04: 0.25 mg via INTRAVENOUS
  Filled 2021-09-04: qty 5

## 2021-09-04 MED ORDER — SODIUM CHLORIDE 0.9 % IV SOLN
500.0000 mg/m2 | Freq: Once | INTRAVENOUS | Status: AC
  Administered 2021-09-04: 1100 mg via INTRAVENOUS
  Filled 2021-09-04: qty 40

## 2021-09-04 MED ORDER — SODIUM CHLORIDE 0.9 % IV SOLN
360.0000 mg | Freq: Once | INTRAVENOUS | Status: AC
  Administered 2021-09-04: 360 mg via INTRAVENOUS
  Filled 2021-09-04: qty 24

## 2021-09-04 NOTE — Patient Instructions (Signed)
[facility] ONCOL[facility]  Discharge Instructions: Thank you for choosing [facility] to provide your oncology and hematology care.   If you have a lab appointment with the [facility], please go directly to the [facility] and check in at the registration area.   Wear comfortable clothing and clothing appropriate for easy access to any Portacath or PICC line.   We strive to give you quality time with your provider. You may need to reschedule your appointment if you arrive late (15 or more minutes).  Arriving late affects you and other patients whose appointments are after yours.  Also, if you miss three or more appointments without notifying the office, you may be dismissed from the clinic at the provider's discretion.      For prescription refill requests, have your pharmacy contact our office and allow 72 hours for refills to be completed.    Today you received the following chemotherapy and/or immunotherapy agents: Opdivo, Alimta, & Carboplatin     To help prevent nausea and vomiting after your treatment, we encourage you to take your nausea medication as directed.  BELOW ARE SYMPTOMS THAT SHOULD BE REPORTED IMMEDIATELY: *FEVER GREATER THAN 100.4 F (38 C) OR HIGHER *CHILLS OR SWEATING *NAUSEA AND VOMITING THAT IS NOT CONTROLLED WITH YOUR NAUSEA MEDICATION *UNUSUAL SHORTNESS OF BREATH *UNUSUAL BRUISING OR BLEEDING *URINARY PROBLEMS (pain or burning when urinating, or frequent urination) *BOWEL PROBLEMS (unusual diarrhea, constipation, pain near the anus) TENDERNESS IN MOUTH AND THROAT WITH OR WITHOUT PRESENCE OF ULCERS (sore throat, sores in mouth, or a toothache) UNUSUAL RASH, SWELLING OR PAIN  UNUSUAL VAGINAL DISCHARGE OR ITCHING   Items with * indicate a potential emergency and should be followed up as soon as possible or go to the Emergency Department if any problems should occur.  Please show the CHEMOTHERAPY ALERT CARD or IMMUNOTHERAPY ALERT  CARD at check-in to the Emergency Department and triage nurse.  Should you have questions after your visit or need to cancel or reschedule your appointment, please contact [facility]  Dept: [phone]  and follow the prompts.  Office hours are 8:00 a.m. to 4:30 p.m. Monday - Friday. Please note that voicemails left after 4:00 p.m. may not be returned until the following business day.  We are closed weekends and major holidays. You have access to a nurse at all times for urgent questions. Please call the main number to the clinic Dept: [phone] and follow the prompts.   For any non-urgent questions, you may also contact your provider using MyChart. We now offer e-Visits for anyone 70 and older to request care online for non-urgent symptoms. For details visit [email or web address].   Also download the MyChart app! Go to the app store, search "MyChart", open the app, select [facility], and log in with your MyChart username and password.  Due to Covid, a mask is required upon entering the hospital/clinic. If you do not have a mask, one will be given to you upon arrival. For doctor visits, patients may have 1 support person aged 56 or older with them. For treatment visits, patients cannot have anyone with them due to current Covid guidelines and our immunocompromised population.

## 2021-09-04 NOTE — Progress Notes (Signed)
[facility] Telephone:(336) [phone]   Fax:(336) [place] [address]  DIAGNOSIS: Stage IIIB (T3b, N2, M0) non-small cell lung cancer, adenocarcinoma presented with large right lower lobe lung mass and suspicious right lower paratracheal lymphadenopathy diagnosed in June 2022.  Molecular studies by [employer] 360 showed no actionable mutations  PRIOR THERAPY: None.  CURRENT THERAPY: Neoadjuvant systemic chemotherapy according to the Checkmate 816 with carboplatin for AUC of 5, Alimta 500 Mg/M2 and nivolumab 360 Mg IV every 3 weeks for 3 cycles.  First dose August 06, 2021.  Status post 1 cycle  INTERVAL HISTORY: Paul [name] 75 y.o. male returns to the clinic today for follow-up visit.  The patient is feeling fine today with no concerning complaints.  He tolerated the first cycle of his treatment with carboplatin, Alimta and nivolumab fairly well.  He denied having any chest pain, shortness of breath, cough or hemoptysis.  He denied having any fever or chills.  He has no nausea, vomiting, diarrhea or constipation.  He has no headache or visual changes.  He has no weight loss or night sweats.  The patient is here today for evaluation before starting cycle #2.  MEDICAL HISTORY: Past Medical History:  Diagnosis Date   CHF (congestive heart failure) ([place])    Coronary artery disease    Diabetes mellitus without complication ([place])    Hypertension    Hypothyroidism    Ischemic cardiomyopathy    Pneumonia    a long time ago   Sleep apnea    no longer uses a cpap    ALLERGIES:  has No Known Allergies.  MEDICATIONS:  Current Outpatient Medications  Medication Sig Dispense Refill   aspirin 81 MG chewable tablet Take 81 mg by mouth daily.     blood glucose meter kit and supplies KIT Dispense based on patient and insurance preference. Use up to four times daily as directed. 1  each 0   carvedilol (COREG) 25 MG tablet Take 25 mg by mouth at bedtime.     folic acid (FOLVITE) 1 MG tablet Take 1 tablet (1 mg total) by mouth daily. 30 tablet 4   furosemide (LASIX) 20 MG tablet Take 20 mg by mouth at bedtime.     insulin glargine (LANTUS) 100 UNIT/ML Solostar Pen Inject 10 Units into the skin daily. 5 mL 5   levothyroxine (SYNTHROID) 25 MCG tablet Take 25 mcg by mouth at bedtime.     Menthol-Methyl Salicylate (MUSCLE RUB) 10-15 % CREA Apply 1 application topically daily as needed for muscle pain.     metFORMIN (GLUCOPHAGE) 500 MG tablet Take 1 tablet (500 mg total) by mouth 2 (two) times daily with a meal. 60 tablet 3   Multiple Vitamins-Minerals (MULTIVITAMIN WITH MINERALS) tablet Take 1 tablet by mouth daily.     nitroGLYCERIN (NITROSTAT) 0.4 MG SL tablet Place 0.4 mg under the tongue every 5 (five) minutes as needed for chest pain.     omeprazole (PRILOSEC) 20 MG capsule Take 20 mg by mouth daily.     prochlorperazine (COMPAZINE) 10 MG tablet Take 1 tablet (10 mg total) by mouth every 6 (six) hours as needed for nausea or vomiting. 30 tablet 0   sacubitril-valsartan (ENTRESTO) 49-51 MG Take 0.5 tablets by mouth 2 (two) times daily.     No current facility-administered medications for this visit.    SURGICAL HISTORY:  Past Surgical History:  Procedure  Laterality Date   BRONCHIAL BIOPSY  06/04/2021   Procedure: BRONCHIAL BIOPSIES;  Surgeon: Garner Nash, DO;  Location: [facility];  Service: Pulmonary;;   BRONCHIAL BRUSHINGS  06/04/2021   Procedure: BRONCHIAL BRUSHINGS;  Surgeon: Garner Nash, DO;  Location: [facility];  Service: Pulmonary;;   BRONCHIAL NEEDLE ASPIRATION BIOPSY  06/04/2021   Procedure: BRONCHIAL NEEDLE ASPIRATION BIOPSIES;  Surgeon: Garner Nash, DO;  Location: [facility];  Service: Pulmonary;;   BRONCHIAL WASHINGS  06/04/2021   Procedure: BRONCHIAL WASHINGS;  Surgeon: Garner Nash, DO;  Location: [facility];  Service: Pulmonary;;    CARDIAC SURGERY     COLONOSCOPY     CORONARY ARTERY BYPASS GRAFT     2019 at [facility] Right 06/04/2021   Procedure: [place];  Surgeon: Garner Nash, DO;  Location: [facility];  Service: Pulmonary;  Laterality: Right;   VIDEO BRONCHOSCOPY WITH ENDOBRONCHIAL ULTRASOUND  06/04/2021   Procedure: VIDEO BRONCHOSCOPY WITH ENDOBRONCHIAL ULTRASOUND;  Surgeon: Garner Nash, DO;  Location: [facility];  Service: Pulmonary;;    REVIEW OF SYSTEMS:  A comprehensive review of systems was negative.   PHYSICAL EXAMINATION: General appearance: alert, cooperative, and no distress Head: Normocephalic, without obvious abnormality, atraumatic Neck: no adenopathy, no JVD, supple, symmetrical, trachea midline, and thyroid not enlarged, symmetric, no tenderness/mass/nodules Lymph nodes: Cervical, supraclavicular, and axillary nodes normal. Resp: clear to auscultation bilaterally Back: symmetric, no curvature. ROM normal. No CVA tenderness. Cardio: regular rate and rhythm, S1, S2 normal, no murmur, click, rub or gallop GI: soft, non-tender; bowel sounds normal; no masses,  no organomegaly Extremities: extremities normal, atraumatic, no cyanosis or edema  ECOG PERFORMANCE STATUS: 1 - Symptomatic but completely ambulatory  Blood pressure (!) 162/82, pulse 83, temperature (!) 97 F (36.1 C), temperature source Tympanic, resp. rate 20, height 5' 8"  (1.727 m), weight 242 lb 11.2 oz (110.1 kg), SpO2 99 %.  LABORATORY DATA: Lab Results  Component Value Date   WBC 7.2 09/04/2021   HGB 13.2 09/04/2021   HCT 38.8 (L) 09/04/2021   MCV 93.0 09/04/2021   PLT 471 (H) 09/04/2021      Chemistry      Component Value Date/Time   NA 143 08/27/2021 1417   K 4.0 08/27/2021 1417   CL 112 (H) 08/27/2021 1417   CO2 24 08/27/2021 1417   BUN 9 08/27/2021 1417   CREATININE 1.25 (H) 08/27/2021 1417      Component Value  Date/Time   CALCIUM 9.1 08/27/2021 1417   ALKPHOS 117 08/27/2021 1417   AST 16 08/27/2021 1417   ALT 15 08/27/2021 1417   BILITOT 0.4 08/27/2021 1417       RADIOGRAPHIC STUDIES: No results found.  ASSESSMENT AND PLAN: This is a very pleasant 75 years old African-American male diagnosed with a stage IIIB (T3b, N2, M0) non-small cell lung cancer, adenocarcinoma presented with large right lower lobe lung mass and suspicious right lower lobe paratracheal lymphadenopathy diagnosed in June 2022. Has MRI of the brain is still pending. Molecular studies by [employer] 360 showed no actionable mutations. The patient is currently undergoing neoadjuvant chemotherapy according to the Checkmate 816 with carboplatin for AUC of 5, Alimta 500 Mg/M2 and nivolumab 360 Mg IV every 3 weeks.  The patient tolerated the first cycle of his treatment well with no concerning adverse effects. I recommended for him to proceed with cycle #2 today as planned. I will see him back  for follow-up visit in 3 weeks for evaluation before the last cycle of his neoadjuvant chemotherapy. He did not have the MRI of the brain is scheduled yet because of lack of approval from the [place] facility and we are still working with them to get the approval. The patient was advised to call immediately if he has any other concerning symptoms in the interval. The patient voices understanding of current disease status and treatment options and is in agreement with the current care plan.  All questions were answered. The patient knows to call the clinic with any problems, questions or concerns. We can certainly see the patient much sooner if necessary.   Disclaimer: This note was dictated with voice recognition software. Similar sounding words can inadvertently be transcribed and may not be corrected upon review.

## 2021-09-10 ENCOUNTER — Other Ambulatory Visit: Payer: Self-pay

## 2021-09-10 ENCOUNTER — Inpatient Hospital Stay: Payer: No Typology Code available for payment source

## 2021-09-10 DIAGNOSIS — Z5112 Encounter for antineoplastic immunotherapy: Secondary | ICD-10-CM | POA: Diagnosis not present

## 2021-09-10 DIAGNOSIS — C3491 Malignant neoplasm of unspecified part of right bronchus or lung: Secondary | ICD-10-CM

## 2021-09-10 LAB — CMP (CANCER CENTER ONLY)
ALT: 12 U/L (ref 0–44)
AST: 13 U/L — ABNORMAL LOW (ref 15–41)
Albumin: 3.4 g/dL — ABNORMAL LOW (ref 3.5–5.0)
Alkaline Phosphatase: 114 U/L (ref 38–126)
Anion gap: 7 (ref 5–15)
BUN: 17 mg/dL (ref 8–23)
CO2: 28 mmol/L (ref 22–32)
Calcium: 9.9 mg/dL (ref 8.9–10.3)
Chloride: 104 mmol/L (ref 98–111)
Creatinine: 1.08 mg/dL (ref 0.61–1.24)
GFR, Estimated: >60 mL/min (ref >60)
Glucose, Bld: 191 mg/dL — ABNORMAL HIGH (ref 70–99)
Potassium: 4 mmol/L (ref 3.5–5.1)
Sodium: 139 mmol/L (ref 135–145)
Total Bilirubin: 0.8 mg/dL (ref 0.3–1.2)
Total Protein: 6.5 g/dL (ref 6.5–8.1)

## 2021-09-10 LAB — CBC WITH DIFFERENTIAL (CANCER CENTER ONLY)
Abs Immature Granulocytes: 0.01 10*3/uL (ref 0.00–0.07)
Basophils Absolute: 0 10*3/uL (ref 0.0–0.1)
Basophils Relative: 1 %
Eosinophils Absolute: 0 10*3/uL (ref 0.0–0.5)
Eosinophils Relative: 1 %
HCT: 39 % (ref 39.0–52.0)
Hemoglobin: 13.3 g/dL (ref 13.0–17.0)
Immature Granulocytes: 0 %
Lymphocytes Relative: 32 %
Lymphs Abs: 1.1 10*3/uL (ref 0.7–4.0)
MCH: 31.5 pg (ref 26.0–34.0)
MCHC: 34.1 g/dL (ref 30.0–36.0)
MCV: 92.4 fL (ref 80.0–100.0)
Monocytes Absolute: 0.1 10*3/uL (ref 0.1–1.0)
Monocytes Relative: 3 %
Neutro Abs: 2.2 10*3/uL (ref 1.7–7.7)
Neutrophils Relative %: 63 %
Platelet Count: 262 10*3/uL (ref 150–400)
RBC: 4.22 MIL/uL (ref 4.22–5.81)
RDW: 13.9 % (ref 11.5–15.5)
WBC Count: 3.4 10*3/uL — ABNORMAL LOW (ref 4.0–10.5)
nRBC: 0 % (ref 0.0–0.2)

## 2021-09-10 LAB — TSH: TSH: 2.563 u[IU]/mL (ref 0.350–4.500)

## 2021-09-17 ENCOUNTER — Other Ambulatory Visit: Payer: No Typology Code available for payment source

## 2021-09-24 ENCOUNTER — Inpatient Hospital Stay: Payer: No Typology Code available for payment source

## 2021-09-24 ENCOUNTER — Inpatient Hospital Stay (HOSPITAL_BASED_OUTPATIENT_CLINIC_OR_DEPARTMENT_OTHER): Payer: No Typology Code available for payment source | Admitting: Internal Medicine

## 2021-09-24 ENCOUNTER — Other Ambulatory Visit: Payer: Self-pay

## 2021-09-24 ENCOUNTER — Encounter: Payer: Self-pay | Admitting: Internal Medicine

## 2021-09-24 VITALS — BP 144/72 | HR 80 | Temp 97.7°F | Resp 20 | Ht 68.0 in | Wt 241.7 lb

## 2021-09-24 DIAGNOSIS — C3490 Malignant neoplasm of unspecified part of unspecified bronchus or lung: Secondary | ICD-10-CM

## 2021-09-24 DIAGNOSIS — C3491 Malignant neoplasm of unspecified part of right bronchus or lung: Secondary | ICD-10-CM

## 2021-09-24 DIAGNOSIS — Z5111 Encounter for antineoplastic chemotherapy: Secondary | ICD-10-CM

## 2021-09-24 DIAGNOSIS — Z5112 Encounter for antineoplastic immunotherapy: Secondary | ICD-10-CM

## 2021-09-24 LAB — CBC WITH DIFFERENTIAL (CANCER CENTER ONLY)
Abs Immature Granulocytes: 0.06 10*3/uL (ref 0.00–0.07)
Basophils Absolute: 0 10*3/uL (ref 0.0–0.1)
Basophils Relative: 0 %
Eosinophils Absolute: 0.2 10*3/uL (ref 0.0–0.5)
Eosinophils Relative: 4 %
HCT: 37.5 % — ABNORMAL LOW (ref 39.0–52.0)
Hemoglobin: 12.6 g/dL — ABNORMAL LOW (ref 13.0–17.0)
Immature Granulocytes: 1 %
Lymphocytes Relative: 31 %
Lymphs Abs: 1.7 10*3/uL (ref 0.7–4.0)
MCH: 31.3 pg (ref 26.0–34.0)
MCHC: 33.6 g/dL (ref 30.0–36.0)
MCV: 93.1 fL (ref 80.0–100.0)
Monocytes Absolute: 0.9 10*3/uL (ref 0.1–1.0)
Monocytes Relative: 16 %
Neutro Abs: 2.6 10*3/uL (ref 1.7–7.7)
Neutrophils Relative %: 48 %
Platelet Count: 257 10*3/uL (ref 150–400)
RBC: 4.03 MIL/uL — ABNORMAL LOW (ref 4.22–5.81)
RDW: 14.9 % (ref 11.5–15.5)
WBC Count: 5.5 10*3/uL (ref 4.0–10.5)
nRBC: 0.4 % — ABNORMAL HIGH (ref 0.0–0.2)

## 2021-09-24 LAB — CMP (CANCER CENTER ONLY)
ALT: 11 U/L (ref 0–44)
AST: 16 U/L (ref 15–41)
Albumin: 3.6 g/dL (ref 3.5–5.0)
Alkaline Phosphatase: 118 U/L (ref 38–126)
Anion gap: 12 (ref 5–15)
BUN: 15 mg/dL (ref 8–23)
CO2: 23 mmol/L (ref 22–32)
Calcium: 9.5 mg/dL (ref 8.9–10.3)
Chloride: 109 mmol/L (ref 98–111)
Creatinine: 1.22 mg/dL (ref 0.61–1.24)
GFR, Estimated: >60 mL/min (ref >60)
Glucose, Bld: 195 mg/dL — ABNORMAL HIGH (ref 70–99)
Potassium: 4.1 mmol/L (ref 3.5–5.1)
Sodium: 144 mmol/L (ref 135–145)
Total Bilirubin: 0.4 mg/dL (ref 0.3–1.2)
Total Protein: 6.8 g/dL (ref 6.5–8.1)

## 2021-09-24 MED ORDER — SODIUM CHLORIDE 0.9 % IV SOLN
10.0000 mg | Freq: Once | INTRAVENOUS | Status: AC
  Administered 2021-09-24: 10 mg via INTRAVENOUS
  Filled 2021-09-24: qty 10

## 2021-09-24 MED ORDER — PALONOSETRON HCL INJECTION 0.25 MG/5ML
0.2500 mg | Freq: Once | INTRAVENOUS | Status: AC
  Administered 2021-09-24: 0.25 mg via INTRAVENOUS
  Filled 2021-09-24: qty 5

## 2021-09-24 MED ORDER — SODIUM CHLORIDE 0.9 % IV SOLN: Freq: Once | INTRAVENOUS | Status: AC

## 2021-09-24 MED ORDER — SODIUM CHLORIDE 0.9 % IV SOLN
360.0000 mg | Freq: Once | INTRAVENOUS | Status: AC
  Administered 2021-09-24: 360 mg via INTRAVENOUS
  Filled 2021-09-24: qty 24

## 2021-09-24 MED ORDER — SODIUM CHLORIDE 0.9 % IV SOLN
500.0000 mg/m2 | Freq: Once | INTRAVENOUS | Status: AC
  Administered 2021-09-24: 1100 mg via INTRAVENOUS
  Filled 2021-09-24: qty 40

## 2021-09-24 MED ORDER — SODIUM CHLORIDE 0.9 % IV SOLN
507.5000 mg | Freq: Once | INTRAVENOUS | Status: AC
  Administered 2021-09-24: 510 mg via INTRAVENOUS
  Filled 2021-09-24: qty 51

## 2021-09-24 MED ORDER — SODIUM CHLORIDE 0.9 % IV SOLN
150.0000 mg | Freq: Once | INTRAVENOUS | Status: AC
  Administered 2021-09-24: 150 mg via INTRAVENOUS
  Filled 2021-09-24: qty 150

## 2021-09-24 MED ORDER — CYANOCOBALAMIN 1000 MCG/ML IJ SOLN
1000.0000 ug | Freq: Once | INTRAMUSCULAR | Status: AC
  Administered 2021-09-24: 1000 ug via INTRAMUSCULAR
  Filled 2021-09-24: qty 1

## 2021-09-24 NOTE — Progress Notes (Signed)
[facility] Telephone:(336) [phone]   Fax:(336) [place] [address]  DIAGNOSIS: Stage IIIB (T3b, N2, M0) non-small cell lung cancer, adenocarcinoma presented with large right lower lobe lung mass and suspicious right lower paratracheal lymphadenopathy diagnosed in June 2022.  Molecular studies by Guardant 360 showed no actionable mutations  PRIOR THERAPY: None.  CURRENT THERAPY: Neoadjuvant systemic chemotherapy according to the Checkmate 816 with carboplatin for AUC of 5, Alimta 500 Mg/M2 and nivolumab 360 Mg IV every 3 weeks for 3 cycles.  First dose August 06, 2021.  Status post 2 cycle  INTERVAL HISTORY: Paul [name] 75 y.o. male returns to the clinic today for follow-up visit.  The patient is feeling fine today with no concerning complaints except for congestion from seasonal allergy.  He denied having any current chest pain, shortness of breath, cough or hemoptysis.  He denied having any fever or chills.  He has no nausea, vomiting, diarrhea or constipation.  He has no headache or visual changes.  He has no weight loss or night sweats.  He continues to tolerate his treatment with chemotherapy fairly well.  He is here for evaluation before starting cycle #3.  MEDICAL HISTORY: Past Medical History:  Diagnosis Date   CHF (congestive heart failure) ([place])    Coronary artery disease    Diabetes mellitus without complication ([place])    Hypertension    Hypothyroidism    Ischemic cardiomyopathy    Pneumonia    a long time ago   Sleep apnea    no longer uses a cpap    ALLERGIES:  has No Known Allergies.  MEDICATIONS:  Current Outpatient Medications  Medication Sig Dispense Refill   aspirin 81 MG chewable tablet Take 81 mg by mouth daily.     blood glucose meter kit and supplies KIT Dispense based on patient and insurance preference. Use up to four times daily as  directed. 1 each 0   carvedilol (COREG) 25 MG tablet Take 25 mg by mouth at bedtime.     folic acid (FOLVITE) 1 MG tablet Take 1 tablet (1 mg total) by mouth daily. 30 tablet 4   furosemide (LASIX) 20 MG tablet Take 20 mg by mouth at bedtime.     insulin glargine (LANTUS) 100 UNIT/ML Solostar Pen Inject 10 Units into the skin daily. 5 mL 5   levothyroxine (SYNTHROID) 25 MCG tablet Take 25 mcg by mouth at bedtime.     Menthol-Methyl Salicylate (MUSCLE RUB) 10-15 % CREA Apply 1 application topically daily as needed for muscle pain.     metFORMIN (GLUCOPHAGE) 500 MG tablet Take 1 tablet (500 mg total) by mouth 2 (two) times daily with a meal. 60 tablet 3   Multiple Vitamins-Minerals (MULTIVITAMIN WITH MINERALS) tablet Take 1 tablet by mouth daily.     nitroGLYCERIN (NITROSTAT) 0.4 MG SL tablet Place 0.4 mg under the tongue every 5 (five) minutes as needed for chest pain.     omeprazole (PRILOSEC) 20 MG capsule Take 20 mg by mouth daily.     prochlorperazine (COMPAZINE) 10 MG tablet Take 1 tablet (10 mg total) by mouth every 6 (six) hours as needed for nausea or vomiting. 30 tablet 0   sacubitril-valsartan (ENTRESTO) 49-51 MG Take 0.5 tablets by mouth 2 (two) times daily.     No current facility-administered medications for this visit.    SURGICAL HISTORY:  Past Surgical History:  Procedure  Laterality Date   BRONCHIAL BIOPSY  06/04/2021   Procedure: BRONCHIAL BIOPSIES;  Surgeon: Garner Nash, DO;  Location: [facility];  Service: Pulmonary;;   BRONCHIAL BRUSHINGS  06/04/2021   Procedure: BRONCHIAL BRUSHINGS;  Surgeon: Garner Nash, DO;  Location: [facility];  Service: Pulmonary;;   BRONCHIAL NEEDLE ASPIRATION BIOPSY  06/04/2021   Procedure: BRONCHIAL NEEDLE ASPIRATION BIOPSIES;  Surgeon: Garner Nash, DO;  Location: [facility];  Service: Pulmonary;;   BRONCHIAL WASHINGS  06/04/2021   Procedure: BRONCHIAL WASHINGS;  Surgeon: Garner Nash, DO;  Location: [facility];  Service:  Pulmonary;;   CARDIAC SURGERY     COLONOSCOPY     CORONARY ARTERY BYPASS GRAFT     2019 at [facility] Right 06/04/2021   Procedure: [place];  Surgeon: Garner Nash, DO;  Location: [facility];  Service: Pulmonary;  Laterality: Right;   VIDEO BRONCHOSCOPY WITH ENDOBRONCHIAL ULTRASOUND  06/04/2021   Procedure: VIDEO BRONCHOSCOPY WITH ENDOBRONCHIAL ULTRASOUND;  Surgeon: Garner Nash, DO;  Location: [facility];  Service: Pulmonary;;    REVIEW OF SYSTEMS:  A comprehensive review of systems was negative.   PHYSICAL EXAMINATION: General appearance: alert, cooperative, and no distress Head: Normocephalic, without obvious abnormality, atraumatic Neck: no adenopathy, no JVD, supple, symmetrical, trachea midline, and thyroid not enlarged, symmetric, no tenderness/mass/nodules Lymph nodes: Cervical, supraclavicular, and axillary nodes normal. Resp: clear to auscultation bilaterally Back: symmetric, no curvature. ROM normal. No CVA tenderness. Cardio: regular rate and rhythm, S1, S2 normal, no murmur, click, rub or gallop GI: soft, non-tender; bowel sounds normal; no masses,  no organomegaly Extremities: extremities normal, atraumatic, no cyanosis or edema  ECOG PERFORMANCE STATUS: 1 - Symptomatic but completely ambulatory  Blood pressure (!) 144/72, pulse 80, temperature 97.7 F (36.5 C), temperature source Tympanic, resp. rate 20, height _0  (1.727 m), weight 241 lb 11.2 oz (109.6 kg), SpO2 96 %.  LABORATORY DATA: Lab Results  Component Value Date   WBC 5.5 09/24/2021   HGB 12.6 (L) 09/24/2021   HCT 37.5 (L) 09/24/2021   MCV 93.1 09/24/2021   PLT 257 09/24/2021      Chemistry      Component Value Date/Time   NA 139 09/10/2021 1357   K 4.0 09/10/2021 1357   CL 104 09/10/2021 1357   CO2 28 09/10/2021 1357   BUN 17 09/10/2021 1357   CREATININE 1.08 09/10/2021 1357      Component  Value Date/Time   CALCIUM 9.9 09/10/2021 1357   ALKPHOS 114 09/10/2021 1357   AST 13 (L) 09/10/2021 1357   ALT 12 09/10/2021 1357   BILITOT 0.8 09/10/2021 1357       RADIOGRAPHIC STUDIES: No results found.  ASSESSMENT AND PLAN: This is a very pleasant 75 years old African-American male diagnosed with a stage IIIB (T3b, N2, M0) non-small cell lung cancer, adenocarcinoma presented with large right lower lobe lung mass and suspicious right lower lobe paratracheal lymphadenopathy diagnosed in June 2022. Has MRI of the brain is still pending. Molecular studies by [employer] 360 showed no actionable mutations. The patient is currently undergoing neoadjuvant chemotherapy according to the Checkmate 816 with carboplatin for AUC of 5, Alimta 500 Mg/M2 and nivolumab 360 Mg IV every 3 weeks.  Status post 2 cycles. The patient continues to tolerate his treatment well with no concerning adverse effects. I recommended for him to proceed with cycle #3 today as planned. I will see him back  for follow-up visit in 1 months for evaluation with repeat CT scan of the chest for restaging of his disease.  If he has good response on the upcoming scan, he will be referred to cardiothoracic surgery for evaluation of surgical resection. I will make sure that the patient also has his MRI of the brain done before the next visit. The patient was advised to call immediately if he has any concerning symptoms in the interval.  The patient voices understanding of current disease status and treatment options and is in agreement with the current care plan.  All questions were answered. The patient knows to call the clinic with any problems, questions or concerns. We can certainly see the patient much sooner if necessary.   Disclaimer: This note was dictated with voice recognition software. Similar sounding words can inadvertently be transcribed and may not be corrected upon review.

## 2021-09-24 NOTE — Patient Instructions (Signed)
Steps to Quit Smoking Smoking tobacco is the leading cause of preventable death. It can affect almost every organ in the body. Smoking puts you and people around you at risk for many serious, long-lasting (chronic) diseases. Quitting smoking can be hard, but it is one of the best things that you can do for your health. It is never too late to quit. How do I get ready to quit? When you decide to quit smoking, make a plan to help you succeed. Before you quit: Pick a date to quit. Set a date within the next 2 weeks to give you time to prepare. Write down the reasons why you are quitting. Keep this list in places where you will see it often. Tell your family, friends, and co-workers that you are quitting. Their support is important. Talk with your doctor about the choices that may help you quit. Find out if your health insurance will pay for these treatments. Know the people, places, things, and activities that make you want to smoke (triggers). Avoid them. What first steps can I take to quit smoking? Throw away all cigarettes at home, at work, and in your car. Throw away the things that you use when you smoke, such as ashtrays and lighters. Clean your car. Make sure to empty the ashtray. Clean your home, including curtains and carpets. What can I do to help me quit smoking? Talk with your doctor about taking medicines and seeing a counselor at the same time. You are more likely to succeed when you do both. If you are pregnant or breastfeeding, talk with your doctor about counseling or other ways to quit smoking. Do not take medicine to help you quit smoking unless your doctor tells you to do so. To quit smoking: Quit right away Quit smoking totally, instead of slowly cutting back on how much you smoke over a period of time. Go to counseling. You are more likely to quit if you go to counseling sessions regularly. Take medicine You may take medicines to help you quit. Some medicines need a  prescription, and some you can buy over-the-counter. Some medicines may contain a drug called nicotine to replace the nicotine in cigarettes. Medicines may: Help you to stop having the desire to smoke (cravings). Help to stop the problems that come when you stop smoking (withdrawal symptoms). Your doctor may ask you to use: Nicotine patches, gum, or lozenges. Nicotine inhalers or sprays. Non-nicotine medicine that is taken by mouth. Find resources Find resources and other ways to help you quit smoking and remain smoke-free after you quit. These resources are most helpful when you use them often. They include: Online chats with a counselor. Phone quitlines. Printed self-help materials. Support groups or group counseling. Text messaging programs. Mobile phone apps. Use apps on your mobile phone or tablet that can help you stick to your quit plan. There are many free apps for mobile phones and tablets as well as websites. Examples include Quit Guide from the CDC and smokefree.gov  What things can I do to make it easier to quit?  Talk to your family and friends. Ask them to support and encourage you. Call a phone quitline (1-800-QUIT-NOW), reach out to support groups, or work with a counselor. Ask people who smoke to not smoke around you. Avoid places that make you want to smoke, such as: Bars. Parties. Smoke-break areas at work. Spend time with people who do not smoke. Lower the stress in your life. Stress can make you want to   smoke. Try these things to help your stress: Getting regular exercise. Doing deep-breathing exercises. Doing yoga. Meditating. Doing a body scan. To do this, close your eyes, focus on one area of your body at a time from head to toe. Notice which parts of your body are tense. Try to relax the muscles in those areas. How will I feel when I quit smoking? Day 1 to 3 weeks Within the first 24 hours, you may start to have some problems that come from quitting tobacco.  These problems are very bad 2-3 days after you quit, but they do not often last for more than 2-3 weeks. You may get these symptoms: Mood swings. Feeling restless, nervous, angry, or annoyed. Trouble concentrating. Dizziness. Strong desire for high-sugar foods and nicotine. Weight gain. Trouble pooping (constipation). Feeling like you may vomit (nausea). Coughing or a sore throat. Changes in how the medicines that you take for other issues work in your body. Depression. Trouble sleeping (insomnia). Week 3 and afterward After the first 2-3 weeks of quitting, you may start to notice more positive results, such as: Better sense of smell and taste. Less coughing and sore throat. Slower heart rate. Lower blood pressure. Clearer skin. Better breathing. Fewer sick days. Quitting smoking can be hard. Do not give up if you fail the first time. Some people need to try a few times before they succeed. Do your best to stick to your quit plan, and talk with your doctor if you have any questions or concerns. Summary Smoking tobacco is the leading cause of preventable death. Quitting smoking can be hard, but it is one of the best things that you can do for your health. When you decide to quit smoking, make a plan to help you succeed. Quit smoking right away, not slowly over a period of time. When you start quitting, seek help from your doctor, family, or friends. This information is not intended to replace advice given to you by your health care provider. Make sure you discuss any questions you have with your health care provider. Document Revised: 09/09/2019 Document Reviewed: 03/05/2019 Elsevier Patient Education  2022 Elsevier Inc.  

## 2021-09-24 NOTE — Patient Instructions (Signed)
[facility]  Discharge Instructions: Thank you for choosing [facility] to provide your oncology and hematology care.   If you have a lab appointment with the [facility], please go directly to the [facility] and check in at the registration area.   Wear comfortable clothing and clothing appropriate for easy access to any Portacath or PICC line.   We strive to give you quality time with your provider. You may need to reschedule your appointment if you arrive late (15 or more minutes).  Arriving late affects you and other patients whose appointments are after yours.  Also, if you miss three or more appointments without notifying the office, you may be dismissed from the clinic at the provider's discretion.      For prescription refill requests, have your pharmacy contact our office and allow 72 hours for refills to be completed.    Today you received the following chemotherapy and/or immunotherapy agents : Opdivo, Alimta, Carboplatin      To help prevent nausea and vomiting after your treatment, we encourage you to take your nausea medication as directed.  BELOW ARE SYMPTOMS THAT SHOULD BE REPORTED IMMEDIATELY: *FEVER GREATER THAN 100.4 F (38 C) OR HIGHER *CHILLS OR SWEATING *NAUSEA AND VOMITING THAT IS NOT CONTROLLED WITH YOUR NAUSEA MEDICATION *UNUSUAL SHORTNESS OF BREATH *UNUSUAL BRUISING OR BLEEDING *URINARY PROBLEMS (pain or burning when urinating, or frequent urination) *BOWEL PROBLEMS (unusual diarrhea, constipation, pain near the anus) TENDERNESS IN MOUTH AND THROAT WITH OR WITHOUT PRESENCE OF ULCERS (sore throat, sores in mouth, or a toothache) UNUSUAL RASH, SWELLING OR PAIN  UNUSUAL VAGINAL DISCHARGE OR ITCHING   Items with * indicate a potential emergency and should be followed up as soon as possible or go to the Emergency Department if any problems should occur.  Please show the CHEMOTHERAPY ALERT CARD or IMMUNOTHERAPY ALERT  CARD at check-in to the Emergency Department and triage nurse.  Should you have questions after your visit or need to cancel or reschedule your appointment, please contact [facility]  Dept: [phone]  and follow the prompts.  Office hours are 8:00 a.m. to 4:30 p.m. Monday - Friday. Please note that voicemails left after 4:00 p.m. may not be returned until the following business day.  We are closed weekends and major holidays. You have access to a nurse at all times for urgent questions. Please call the main number to the clinic Dept: [phone] and follow the prompts.   For any non-urgent questions, you may also contact your provider using MyChart. We now offer e-Visits for anyone 17 and older to request care online for non-urgent symptoms. For details visit [email or web address].   Also download the MyChart app! Go to the app store, search "MyChart", open the app, select [facility], and log in with your MyChart username and password.  Due to Covid, a mask is required upon entering the hospital/clinic. If you do not have a mask, one will be given to you upon arrival. For doctor visits, patients may have 1 support person aged 55 or older with them. For treatment visits, patients cannot have anyone with them due to current Covid guidelines and our immunocompromised population.

## 2021-10-15 ENCOUNTER — Telehealth: Payer: Self-pay | Admitting: Physician Assistant

## 2021-10-15 NOTE — Telephone Encounter (Signed)
I am scheduled to see this patient next week on 10/23/2021.  We are planning on reviewing his results of his restaging CT scan at that time.  However, I noticed that the patient CT scan has not been scheduled at this time.  I called the patient but he was driving and could not take down the number to radiology scheduling.  He will call us back later today to get the phone number to radiology scheduling so he can call them and schedule his restaging CT scan preferably on Friday, 10/18/2021 or Monday, 10/21/2021.

## 2021-10-15 NOTE — Progress Notes (Signed)
[place] [address]  DIAGNOSIS:  Stage IIIB (T3b, N2, M0) non-small cell lung cancer, adenocarcinoma presented with large right lower lobe lung mass and suspicious right lower paratracheal lymphadenopathy diagnosed in June 2022.   Molecular studies by [employer] showed no actionable mutations  PRIOR THERAPY: Neoadjuvant systemic chemotherapy according to the Checkmate 816 with carboplatin for AUC of 5, Alimta 500 Mg/M2 and nivolumab 360 Mg IV every 3 weeks for 3 cycles.  Last dose 09/24/21.  Status post 3 cycles.   CURRENT THERAPY: Concurrent chemoradiation with carboplatin for an AUC of 2 and paclitaxel 45 mg per metered square.  First dose expected on 11/04/2021.   INTERVAL HISTORY: Paul [name] 75 y.o. male returns to the clinic today for a follow-up visit.  The patient was last seen on 09/24/2021.  The patient completed neoadjuvant chemotherapy and immunotherapy at that time.  He tolerated this well without any concerning adverse side effects except he developed some thrombophlebitis on his left hand at the site of his last chemotherapy infusion. He has some associated tenderness over the vein and firmness.  Overall, the patient is feeling well today.  He denies any recent fever, chills, night sweats, or unexplained weight loss.  He denies any chest pain, shortness of breath, or hemoptysis. He reports a minor cough secondary some allergies for which he has claritin. He continues to smoke about 1/2 a pack of cigarettes daily. He has NRT but has not used it yet. He denies any nausea, vomiting, diarrhea, or constipation.  He denies any headache or visual changes.  He denies any rashes or skin changes.  The patient recently had a restaging CT scan performed.  He is here today for evaluation to review his scan results.   MEDICAL HISTORY: Past Medical History:  Diagnosis Date   CHF  (congestive heart failure) ([place])    Coronary artery disease    Diabetes mellitus without complication ([place])    Hypertension    Hypothyroidism    Ischemic cardiomyopathy    Pneumonia    a long time ago   Sleep apnea    no longer uses a cpap    ALLERGIES:  has No Known Allergies.  MEDICATIONS:  Current Outpatient Medications  Medication Sig Dispense Refill   aspirin 81 MG chewable tablet Take 81 mg by mouth daily.     blood glucose meter kit and supplies KIT Dispense based on patient and insurance preference. Use up to four times daily as directed. 1 each 0   carvedilol (COREG) 25 MG tablet Take 25 mg by mouth at bedtime.     folic acid (FOLVITE) 1 MG tablet Take 1 tablet (1 mg total) by mouth daily. 30 tablet 4   furosemide (LASIX) 20 MG tablet Take 20 mg by mouth at bedtime.     insulin glargine (LANTUS) 100 UNIT/ML Solostar Pen Inject 10 Units into the skin daily. 5 mL 5   levothyroxine (SYNTHROID) 25 MCG tablet Take 25 mcg by mouth at bedtime.     Menthol-Methyl Salicylate (MUSCLE RUB) 10-15 % CREA Apply 1 application topically daily as needed for muscle pain.     metFORMIN (GLUCOPHAGE) 500 MG tablet Take 1 tablet (500 mg total) by mouth 2 (two) times daily with a meal. 60 tablet 3   Multiple Vitamins-Minerals (MULTIVITAMIN WITH MINERALS) tablet Take 1 tablet by mouth daily.     nitroGLYCERIN (NITROSTAT) 0.4 MG SL tablet Place  0.4 mg under the tongue every 5 (five) minutes as needed for chest pain.     omeprazole (PRILOSEC) 20 MG capsule Take 20 mg by mouth daily.     prochlorperazine (COMPAZINE) 10 MG tablet Take 1 tablet (10 mg total) by mouth every 6 (six) hours as needed for nausea or vomiting. 30 tablet 0   sacubitril-valsartan (ENTRESTO) 49-51 MG Take 0.5 tablets by mouth 2 (two) times daily.     No current facility-administered medications for this visit.    SURGICAL HISTORY:  Past Surgical History:  Procedure Laterality Date   BRONCHIAL BIOPSY  06/04/2021   Procedure:  BRONCHIAL BIOPSIES;  Surgeon: Garner Nash, DO;  Location: [facility];  Service: Pulmonary;;   BRONCHIAL BRUSHINGS  06/04/2021   Procedure: BRONCHIAL BRUSHINGS;  Surgeon: Garner Nash, DO;  Location: [facility];  Service: Pulmonary;;   BRONCHIAL NEEDLE ASPIRATION BIOPSY  06/04/2021   Procedure: BRONCHIAL NEEDLE ASPIRATION BIOPSIES;  Surgeon: Garner Nash, DO;  Location: [facility];  Service: Pulmonary;;   BRONCHIAL WASHINGS  06/04/2021   Procedure: BRONCHIAL WASHINGS;  Surgeon: Garner Nash, DO;  Location: [facility];  Service: Pulmonary;;   CARDIAC SURGERY     COLONOSCOPY     CORONARY ARTERY BYPASS GRAFT     2019 at [facility] Right 06/04/2021   Procedure: [place];  Surgeon: Garner Nash, DO;  Location: [facility];  Service: Pulmonary;  Laterality: Right;   VIDEO BRONCHOSCOPY WITH ENDOBRONCHIAL ULTRASOUND  06/04/2021   Procedure: VIDEO BRONCHOSCOPY WITH ENDOBRONCHIAL ULTRASOUND;  Surgeon: Garner Nash, DO;  Location: [facility];  Service: Pulmonary;;    REVIEW OF SYSTEMS:   Review of Systems  Constitutional: Negative for appetite change, chills, fatigue, fever and unexpected weight change.  HENT: Positive for nasal congestion. Negative for mouth sores, nosebleeds, sore throat and trouble swallowing.   Eyes: Negative for eye problems and icterus.  Respiratory: Positive for minor cough. Negative for hemoptysis, shortness of breath and wheezing.   Cardiovascular: Negative for chest pain and leg swelling.  Gastrointestinal: Negative for abdominal pain, constipation, diarrhea, nausea and vomiting.  Genitourinary: Negative for bladder incontinence, difficulty urinating, dysuria, frequency and hematuria.   Musculoskeletal: Negative for back pain, gait problem, neck pain and neck stiffness.  Skin: Positive for tenderness and firmness along blood vessel in left hand. Negative  for itching and rash.  Neurological: Negative for dizziness, extremity weakness, gait problem, headaches, light-headedness and seizures.  Hematological: Negative for adenopathy. Does not bruise/bleed easily.  Psychiatric/Behavioral: Negative for confusion, depression and sleep disturbance. The patient is not nervous/anxious.     PHYSICAL EXAMINATION:  Blood pressure (!) 150/75, pulse 82, temperature 98 F (36.7 C), temperature source Oral, resp. rate 18, height _0  (1.727 m), weight 246 lb 9.6 oz (111.9 kg), SpO2 100 %.  ECOG PERFORMANCE STATUS: 1 - Symptomatic but completely ambulatory  Physical Exam  Constitutional: Oriented to person, place, and time and well-developed, well-nourished, and in no distress. HENT:  Head: Normocephalic and atraumatic.  Mouth/Throat: Oropharynx is clear and moist. No oropharyngeal exudate.  Eyes: Conjunctivae are normal. Right eye exhibits no discharge. Left eye exhibits no discharge. No scleral icterus.  Neck: Normal range of motion. Neck supple.  Cardiovascular: Normal rate, regular rhythm, normal heart sounds and intact distal pulses.   Pulmonary/Chest: Effort normal and breath sounds normal. No respiratory distress. No wheezes. No rales.  Abdominal: Soft. Bowel sounds are normal. Exhibits no distension  and no mass. There is no tenderness.  Musculoskeletal: Normal range of motion. Exhibits no edema.  Lymphadenopathy:    No cervical adenopathy.  Neurological: Alert and oriented to person, place, and time. Exhibits normal muscle tone. Gait normal. Coordination normal.  Skin: Positive for thrombophlebitis in left hand. Skin is warm and dry. No rash noted. Not diaphoretic. No erythema. No pallor.  Psychiatric: Mood, memory and judgment normal.  Vitals reviewed.  LABORATORY DATA: Lab Results  Component Value Date   WBC 7.4 10/21/2021   HGB 11.7 (L) 10/21/2021   HCT 35.8 (L) 10/21/2021   MCV 96.2 10/21/2021   PLT 214 10/21/2021      Chemistry       Component Value Date/Time   NA 141 10/21/2021 1308   K 4.3 10/21/2021 1308   CL 109 10/21/2021 1308   CO2 27 10/21/2021 1308   BUN 25 (H) 10/21/2021 1308   CREATININE 1.01 10/21/2021 1308      Component Value Date/Time   CALCIUM 8.8 (L) 10/21/2021 1308   ALKPHOS 107 10/21/2021 1308   AST 20 10/21/2021 1308   ALT 12 10/21/2021 1308   BILITOT 0.3 10/21/2021 1308       RADIOGRAPHIC STUDIES:  CT Chest W Contrast  Result Date: 10/23/2021 CLINICAL DATA:  Primary Cancer Type: Lung Imaging Indication: Assess response to therapy Interval therapy since last imaging? Yes Initial Cancer Diagnosis Date: 06/04/2021; Established by: Biopsy-proven Detailed Pathology: Stage IIIB non-small cell lung cancer, adenocarcinoma. Primary Tumor location: Right lower lobe. Surgeries: CABG. Chemotherapy: Yes; Ongoing? Yes; Most recent administration: 09/24/2021 Immunotherapy?  Yes; Type: Nivolumab; Ongoing? Yes Radiation therapy? No EXAM: CT CHEST WITH CONTRAST TECHNIQUE: Multidetector CT imaging of the chest was performed during intravenous contrast administration. CONTRAST:  42m OMNIPAQUE IOHEXOL 350 MG/ML SOLN COMPARISON:  Most recent CT chest 05/30/2021.  05/31/2021 PET-CT. FINDINGS: Cardiovascular: Post CABG Mediastinum/Nodes: RIGHT lower paratracheal node measures 1.1 cm compared to 1.1 cm on prior. No supraclavicular nodes. Lungs/Pleura: RIGHT lower lobe consolidation with air bronchograms measuring 7.6 by 5.1 cm compares to 7.4 x 5.1 cm on comparison CT. No significant change. No new nodularity within the lungs. Upper Abdomen: Limited view of the liver, kidneys, pancreas are unremarkable. Normal adrenal glands. Musculoskeletal: No aggressive osseous lesion. IMPRESSION: 1. Stable masslike consolidation with air bronchograms in the RIGHT lower lobe. 2. Stable mildly enlarged RIGHT lower paratracheal lymph node. 3. No new pulmonary nodularity. 4. No new adenopathy. Electronically Signed   By: SSuzy Bouchard M.D.   On: 10/23/2021 11:33     ASSESSMENT/PLAN:  This is a very pleasant 75year old African-American male diagnosed with stage IIIb (T3b, N2, M0) non-small cell lung cancer, adenocarcinoma.  He presented with a large right upper lobe lung mass and suspicious right lower lobe paratracheal lymphadenopathy.  He was diagnosed in June 2022.  The patient's molecular studies by guardant 360 did not show any actionable mutations.  The patient never had his initial staging brain MRI performed which has been ordered.  The patient completed 3 cycles of neoadjuvant chemotherapy according to the Checkmate 816 trial with carboplatin for an AUC of 5, Alimta 500 mg per metered square, and nivolumab 360 mg IV every 3 weeks.  The patient status post 3 cycles.  His last dose was on 09/24/2021.  The patient recently had a restaging CT scan performed.  Dr. MJulien Nordmannpersonally and independently reviewed the scan discussed results with patient today.  The scan showed stable disease. There was not a significant improvement in  the size of the primary lung mass. The right paratracheal lymph node was stable to slightly enlarged in size.   Dr. Julien Nordmann had a lengthy discussion with the patient about his current condition. It does not appear we were able to downstage his disease enough to make him eligible for surgical resection. Therefore, Dr. Julien Nordmann recommends proceeding with concurrent chemoradiation with carboplatin for an AUC of 2 and taxol 45 mg/m2. The patient is interested in this option and he will likely start his first dose of treatment on 11/04/21.   The adverse side effects were discussed including but limited to alopecia, nausea, vomiting, myelosuppression, peripheral neuropathy, kidney, and liver dysfunction.  He already has this prescription for Compazine 10 mg p.o. every 6 hours as needed for nausea.  I have placed a referral to radiation oncology.   Advised to have his staging brain MRI as planned. I have  given him the number to call radiology scheduling to schedule this.   We will see him back for a follow up visit on 07/11/21 for evaluation with cycle #2.   He was given information today about thrombophlebitis management.   The patient was advised to call immediately if he has any concerning symptoms in the interval. The patient voices understanding of current disease status and treatment options and is in agreement with the current care plan. All questions were answered. The patient knows to call the clinic with any problems, questions or concerns. We can certainly see the patient much sooner if necessary      Orders Placed This Encounter  Procedures   CBC with Differential ([facility] Only)    Standing Status:   Standing    Number of Occurrences:   6    Standing Expiration Date:   10/23/2022   CMP ([facility] only)    Standing Status:   Standing    Number of Occurrences:   6    Standing Expiration Date:   10/23/2022   Ambulatory referral to Radiation Oncology    Referral Priority:   Routine    Referral Type:   Consultation    Referral Reason:   Specialty Services Required    Requested Specialty:   Radiation Oncology    Number of Visits Requested:   [place], PA-C 10/23/21  ADDENDUM: Hematology/Oncology Attending: I had a face-to-face encounter with the patient today.  I reviewed his records, lab, scan and recommended his care plan.  This is a very pleasant 75 years old African-American male diagnosed with stage IIIb (T3, N2, M0) non-small cell lung cancer, adenocarcinoma presented with large right upper lobe lung mass in addition to right lower lobe paratracheal lymphadenopathy diagnosed in June 2022 with no actionable mutations.  The patient underwent 3 cycles of neoadjuvant systemic chemotherapy with carboplatin, Alimta and nivolumab.  He tolerated his treatment well except for fatigue. He had repeat CT scan of the chest performed recently.   I personally and independently reviewed the scan images and discussed the results with the patient today. His scan showed no concerning findings for disease progression but there was mild decrease in the size of the right lung mass.  This is changes in his disease are not enough for [place] to send him to surgery for consideration of surgical resection. I discussed with the patient the option of course of concurrent chemoradiation with weekly carboplatin for AUC of 2 and paclitaxel 45 Mg/M2. The patient is interested in this option. I will refer  him to radiation oncology for evaluation and discussion of the radiotherapy option. I discussed with the patient the adverse effect of the chemotherapy including but not limited to alopecia, myelosuppression, nausea and vomiting, peripheral neuropathy, liver or renal dysfunction. The patient is expected to start the first cycle of this treatment on November 04, 2021. He will come back for follow-up visit 1 week after the start of his treatment for evaluation and management of any adverse effect of his treatment. The patient was advised to call immediately if he has any other concerning symptoms in the interval. The total time spent in the appointment was 40 minutes. Disclaimer: This note was dictated with voice recognition software. Similar sounding words can inadvertently be transcribed and may be missed upon review. Eilleen Kempf, MD 10/23/21

## 2021-10-15 NOTE — Telephone Encounter (Signed)
Sch per 9/27 los,unable to leave msg

## 2021-10-18 ENCOUNTER — Other Ambulatory Visit: Payer: No Typology Code available for payment source

## 2021-10-21 ENCOUNTER — Ambulatory Visit (HOSPITAL_COMMUNITY)
Admission: RE | Admit: 2021-10-21 | Discharge: 2021-10-21 | Disposition: A | Discharge status: Home / Self Care | Payer: No Typology Code available for payment source | Source: Ambulatory Visit | Attending: Internal Medicine | Admitting: Internal Medicine

## 2021-10-21 ENCOUNTER — Encounter (HOSPITAL_COMMUNITY): Payer: Self-pay

## 2021-10-21 ENCOUNTER — Other Ambulatory Visit: Payer: Self-pay

## 2021-10-21 ENCOUNTER — Inpatient Hospital Stay: Payer: No Typology Code available for payment source | Attending: Internal Medicine

## 2021-10-21 DIAGNOSIS — F17210 Nicotine dependence, cigarettes, uncomplicated: Secondary | ICD-10-CM | POA: Insufficient documentation

## 2021-10-21 DIAGNOSIS — C3490 Malignant neoplasm of unspecified part of unspecified bronchus or lung: Secondary | ICD-10-CM | POA: Insufficient documentation

## 2021-10-21 DIAGNOSIS — C3431 Malignant neoplasm of lower lobe, right bronchus or lung: Secondary | ICD-10-CM | POA: Insufficient documentation

## 2021-10-21 DIAGNOSIS — Z923 Personal history of irradiation: Secondary | ICD-10-CM | POA: Diagnosis not present

## 2021-10-21 DIAGNOSIS — Z79899 Other long term (current) drug therapy: Secondary | ICD-10-CM | POA: Insufficient documentation

## 2021-10-21 DIAGNOSIS — Z9221 Personal history of antineoplastic chemotherapy: Secondary | ICD-10-CM | POA: Diagnosis not present

## 2021-10-21 LAB — CMP (CANCER CENTER ONLY)
ALT: 12 U/L (ref 0–44)
AST: 20 U/L (ref 15–41)
Albumin: 3.7 g/dL (ref 3.5–5.0)
Alkaline Phosphatase: 107 U/L (ref 38–126)
Anion gap: 5 (ref 5–15)
BUN: 25 mg/dL — ABNORMAL HIGH (ref 8–23)
CO2: 27 mmol/L (ref 22–32)
Calcium: 8.8 mg/dL — ABNORMAL LOW (ref 8.9–10.3)
Chloride: 109 mmol/L (ref 98–111)
Creatinine: 1.01 mg/dL (ref 0.61–1.24)
GFR, Estimated: >60 mL/min (ref >60)
Glucose, Bld: 173 mg/dL — ABNORMAL HIGH (ref 70–99)
Potassium: 4.3 mmol/L (ref 3.5–5.1)
Sodium: 141 mmol/L (ref 135–145)
Total Bilirubin: 0.3 mg/dL (ref 0.3–1.2)
Total Protein: 6.9 g/dL (ref 6.5–8.1)

## 2021-10-21 LAB — CBC WITH DIFFERENTIAL (CANCER CENTER ONLY)
Abs Immature Granulocytes: 0.02 10*3/uL (ref 0.00–0.07)
Basophils Absolute: 0.1 10*3/uL (ref 0.0–0.1)
Basophils Relative: 1 %
Eosinophils Absolute: 0.1 10*3/uL (ref 0.0–0.5)
Eosinophils Relative: 2 %
HCT: 35.8 % — ABNORMAL LOW (ref 39.0–52.0)
Hemoglobin: 11.7 g/dL — ABNORMAL LOW (ref 13.0–17.0)
Immature Granulocytes: 0 %
Lymphocytes Relative: 25 %
Lymphs Abs: 1.8 10*3/uL (ref 0.7–4.0)
MCH: 31.5 pg (ref 26.0–34.0)
MCHC: 32.7 g/dL (ref 30.0–36.0)
MCV: 96.2 fL (ref 80.0–100.0)
Monocytes Absolute: 1.2 10*3/uL — ABNORMAL HIGH (ref 0.1–1.0)
Monocytes Relative: 16 %
Neutro Abs: 4.2 10*3/uL (ref 1.7–7.7)
Neutrophils Relative %: 56 %
Platelet Count: 214 10*3/uL (ref 150–400)
RBC: 3.72 MIL/uL — ABNORMAL LOW (ref 4.22–5.81)
RDW: 17.3 % — ABNORMAL HIGH (ref 11.5–15.5)
WBC Count: 7.4 10*3/uL (ref 4.0–10.5)
nRBC: 0 % (ref 0.0–0.2)

## 2021-10-21 MED ORDER — IOHEXOL 350 MG/ML SOLN
60.0000 mL | Freq: Once | INTRAVENOUS | Status: AC | PRN
  Administered 2021-10-21: 60 mL via INTRAVENOUS

## 2021-10-23 ENCOUNTER — Other Ambulatory Visit: Payer: Self-pay

## 2021-10-23 ENCOUNTER — Other Ambulatory Visit: Payer: Self-pay | Admitting: Internal Medicine

## 2021-10-23 ENCOUNTER — Other Ambulatory Visit: Payer: No Typology Code available for payment source

## 2021-10-23 ENCOUNTER — Inpatient Hospital Stay (HOSPITAL_BASED_OUTPATIENT_CLINIC_OR_DEPARTMENT_OTHER): Payer: No Typology Code available for payment source | Admitting: Physician Assistant

## 2021-10-23 ENCOUNTER — Encounter: Payer: Self-pay | Admitting: Internal Medicine

## 2021-10-23 VITALS — BP 150/75 | HR 82 | Temp 98.0°F | Resp 18 | Ht 68.0 in | Wt 246.6 lb

## 2021-10-23 DIAGNOSIS — Z7189 Other specified counseling: Secondary | ICD-10-CM | POA: Diagnosis not present

## 2021-10-23 DIAGNOSIS — C3491 Malignant neoplasm of unspecified part of right bronchus or lung: Secondary | ICD-10-CM | POA: Diagnosis not present

## 2021-10-23 DIAGNOSIS — C3431 Malignant neoplasm of lower lobe, right bronchus or lung: Secondary | ICD-10-CM | POA: Diagnosis not present

## 2021-10-23 NOTE — Patient Instructions (Signed)
-  There are two main categories of lung cancer, they are named based on the size of the cancer cell. One is called Non-Small cell lung cancer. The other type is Small Cell Lung Cancer  -We covered a lot of important information at your appointment today regarding what the treatment plan is moving forward. Here are the the main points that were discussed at your office visit with [place] today:  -The treatment that you will receive consists of two chemotherapy drugs called Carboplatin and Paclitaxel (also called Taxol) -We are planning on starting your treatment  on 11/04/21 but before your start your treatment, I would like you to attend a Chemotherapy Education Class. This involves having you sit down with one of our nurse educators. She will discuss with you one-on-one more details about your treatment as well as general information about resources here at the [facility] treatment will be given every week for about 6 weeks or so (as long as you are also receiving radiation). We will check your labs (blood work) once a week . Dr. Julien Nordmann or I will see you every other treatment just to make sure that you are doing well and that everything is on track. -We will get a CT scan about 3 weeks after you complete your treatment  Medications:   Referrals -I will place the referral to radiation oncology to meet with you to discuss starting radiation treatment. Please be on the lookout for a phone call from them to schedule a consultation. They are in the basement of this building. Radiation is daily M-Fri for 6 weeks.  -Please call [phone]. When you call, stay on the line or press 3 to speak to someone. Just tell them your name and date of birth and that you are trying to schedule your brain MRI. They should be able to take it from there.   Follow Up:  -We will see you back for a follow up visit in 3 weeks with your second week of treatment.

## 2021-10-23 NOTE — Progress Notes (Signed)
DISCONTINUE OFF PATHWAY REGIMEN - Other   [id number]:Carboplatin AUC=5 + Pemetrexed 500 mg/m2 q21 Days:   A cycle is every 21 days:     Pemetrexed      Carboplatin   **Always confirm dose/schedule in your pharmacy ordering system**  REASON: Other Reason PRIOR TREATMENT: Carboplatin AUC=5 + Pemetrexed 500 mg/m2 q21 Days TREATMENT RESPONSE: Stable Disease (SD)  START OFF PATHWAY REGIMEN - Other   [id number]:Carboplatin AUC=2 + Paclitaxel 45 mg/m2 Weekly:   Administer weekly:     Paclitaxel      Carboplatin   **Always confirm dose/schedule in your pharmacy ordering system**  Patient Characteristics: Intent of Therapy: Curative Intent, Discussed with Patient

## 2021-10-24 ENCOUNTER — Encounter: Payer: Self-pay | Admitting: *Deleted

## 2021-10-24 ENCOUNTER — Telehealth: Payer: Self-pay | Admitting: Physician Assistant

## 2021-10-24 NOTE — Telephone Encounter (Signed)
Sch per 10/26 los, pt aware

## 2021-10-24 NOTE — Progress Notes (Signed)
I received a message that Paul [name] needs [place] authorization for him to be seen by rad onc.  I reached out to rad onc and med onc authorization coordinator.

## 2021-10-28 NOTE — Progress Notes (Signed)
Pharmacist Chemotherapy Monitoring - Initial Assessment    Anticipated start date: 11/04/21   The following has been reviewed per standard work regarding the patient's treatment regimen: The patient's diagnosis, treatment plan and drug doses, and organ/hematologic function Lab orders and baseline tests specific to treatment regimen  The treatment plan start date, drug sequencing, and pre-medications Prior authorization status  Patient's documented medication list, including drug-drug interaction screen and prescriptions for anti-emetics and supportive care specific to the treatment regimen The drug concentrations, fluid compatibility, administration routes, and timing of the medications to be used The patient's access for treatment and lifetime cumulative dose history, if applicable  The patient's medication allergies and previous infusion related reactions, if applicable   Changes made to treatment plan:  N/A  Follow up needed:  N/A  Benn Moulder, PharmD Pharmacy Resident  10/28/2021 2:41 PM

## 2021-11-01 MED FILL — Dexamethasone Sodium Phosphate Inj 100 MG/10ML: INTRAMUSCULAR | Qty: 1 | Status: AC

## 2021-11-04 ENCOUNTER — Inpatient Hospital Stay: Payer: No Typology Code available for payment source | Attending: Internal Medicine

## 2021-11-04 ENCOUNTER — Ambulatory Visit: Payer: No Typology Code available for payment source

## 2021-11-04 ENCOUNTER — Other Ambulatory Visit: Payer: Self-pay

## 2021-11-04 DIAGNOSIS — C3431 Malignant neoplasm of lower lobe, right bronchus or lung: Secondary | ICD-10-CM | POA: Diagnosis present

## 2021-11-04 DIAGNOSIS — C3491 Malignant neoplasm of unspecified part of right bronchus or lung: Secondary | ICD-10-CM

## 2021-11-04 DIAGNOSIS — Z5111 Encounter for antineoplastic chemotherapy: Secondary | ICD-10-CM | POA: Diagnosis present

## 2021-11-04 LAB — CBC WITH DIFFERENTIAL (CANCER CENTER ONLY)
Abs Immature Granulocytes: 0.02 10*3/uL (ref 0.00–0.07)
Basophils Absolute: 0 10*3/uL (ref 0.0–0.1)
Basophils Relative: 1 %
Eosinophils Absolute: 0.3 10*3/uL (ref 0.0–0.5)
Eosinophils Relative: 4 %
HCT: 35.3 % — ABNORMAL LOW (ref 39.0–52.0)
Hemoglobin: 11.9 g/dL — ABNORMAL LOW (ref 13.0–17.0)
Immature Granulocytes: 0 %
Lymphocytes Relative: 29 %
Lymphs Abs: 2.1 10*3/uL (ref 0.7–4.0)
MCH: 32.2 pg (ref 26.0–34.0)
MCHC: 33.7 g/dL (ref 30.0–36.0)
MCV: 95.4 fL (ref 80.0–100.0)
Monocytes Absolute: 0.8 10*3/uL (ref 0.1–1.0)
Monocytes Relative: 11 %
Neutro Abs: 3.9 10*3/uL (ref 1.7–7.7)
Neutrophils Relative %: 55 %
Platelet Count: 214 10*3/uL (ref 150–400)
RBC: 3.7 MIL/uL — ABNORMAL LOW (ref 4.22–5.81)
RDW: 17.5 % — ABNORMAL HIGH (ref 11.5–15.5)
WBC Count: 7.1 10*3/uL (ref 4.0–10.5)
nRBC: 0 % (ref 0.0–0.2)

## 2021-11-04 LAB — CMP (CANCER CENTER ONLY)
ALT: 11 U/L (ref 0–44)
AST: 23 U/L (ref 15–41)
Albumin: 3.3 g/dL — ABNORMAL LOW (ref 3.5–5.0)
Alkaline Phosphatase: 124 U/L (ref 38–126)
Anion gap: 6 (ref 5–15)
BUN: 23 mg/dL (ref 8–23)
CO2: 24 mmol/L (ref 22–32)
Calcium: 9 mg/dL (ref 8.9–10.3)
Chloride: 112 mmol/L — ABNORMAL HIGH (ref 98–111)
Creatinine: 1.41 mg/dL — ABNORMAL HIGH (ref 0.61–1.24)
GFR, Estimated: 51 mL/min — ABNORMAL LOW (ref >60)
Glucose, Bld: 199 mg/dL — ABNORMAL HIGH (ref 70–99)
Potassium: 4.5 mmol/L (ref 3.5–5.1)
Sodium: 142 mmol/L (ref 135–145)
Total Bilirubin: 0.2 mg/dL — ABNORMAL LOW (ref 0.3–1.2)
Total Protein: 6.8 g/dL (ref 6.5–8.1)

## 2021-11-05 ENCOUNTER — Encounter: Payer: Self-pay | Admitting: *Deleted

## 2021-11-05 NOTE — Progress Notes (Signed)
I received a message that Paul [name] has been authorized per the [place] for rad onc referral. I updated rad onc of this and to get him scheduled soon.

## 2021-11-05 NOTE — Progress Notes (Deleted)
[place] [id number]  DIAGNOSIS: Stage IIIB (T3b, N2, M0) non-small cell lung cancer, adenocarcinoma presented with large right lower lobe lung mass and suspicious right lower paratracheal lymphadenopathy diagnosed in June 2022.   Molecular studies by [employer] 360 showed no actionable mutations  PRIOR THERAPY: Neoadjuvant systemic chemotherapy according to the Checkmate 816 with carboplatin for AUC of 5, Alimta 500 Mg/M2 and nivolumab 360 Mg IV every 3 weeks for 3 cycles.  Last dose 09/24/21.  Status post 3 cycles.  CURRENT THERAPY: Concurrent chemoradiation with carboplatin for an AUC of 2 and paclitaxel 45 mg per metered square.  First dose expected on 11/11/2021.   INTERVAL HISTORY: Paul [name] 75 y.o. male returns  to the clinic today for a follow-up visit.  The patient was last seen in the clinic on 10/23/21. At that time, there was not considerable improvement after completing neoadjuvant chemotherapy/immunotherapy to proceed with surgery. Therefore, the plan is to undergo concurrent chemoradiation. However, there has been some delays getting [facility] authorization to see radiation oncology. He has an appointment with them on ***.   MRII  Overall, the patient is feeling well today.  He denies any recent fever, chills, night sweats, or unexplained weight loss.  He denies any chest pain, shortness of breath, or hemoptysis. He reports a minor cough secondary some allergies for which he has claritin. He continues to smoke about 1/2 a pack of cigarettes daily. He has NRT but has not used it yet. He denies any nausea, vomiting, diarrhea, or constipation.  He denies any headache or visual changes.  He denies any rashes or skin changes. He is here for evaluation before starting cycle #1.   MEDICAL HISTORY: Past Medical History:  Diagnosis Date   CHF (congestive heart failure) ([place])     Coronary artery disease    Diabetes mellitus without complication ([place])    Hypertension    Hypothyroidism    Ischemic cardiomyopathy    Pneumonia    a long time ago   Sleep apnea    no longer uses a cpap    ALLERGIES:  has No Known Allergies.  MEDICATIONS:  Current Outpatient Medications  Medication Sig Dispense Refill   aspirin 81 MG chewable tablet Take 81 mg by mouth daily.     blood glucose meter kit and supplies KIT Dispense based on patient and insurance preference. Use up to four times daily as directed. 1 each 0   carvedilol (COREG) 25 MG tablet Take 25 mg by mouth at bedtime.     folic acid (FOLVITE) 1 MG tablet Take 1 tablet (1 mg total) by mouth daily. 30 tablet 4   furosemide (LASIX) 20 MG tablet Take 20 mg by mouth at bedtime.     insulin glargine (LANTUS) 100 UNIT/ML Solostar Pen Inject 10 Units into the skin daily. 5 mL 5   levothyroxine (SYNTHROID) 25 MCG tablet Take 25 mcg by mouth at bedtime.     Menthol-Methyl Salicylate (MUSCLE RUB) 10-15 % CREA Apply 1 application topically daily as needed for muscle pain.     metFORMIN (GLUCOPHAGE) 500 MG tablet Take 1 tablet (500 mg total) by mouth 2 (two) times daily with a meal. 60 tablet 3   Multiple Vitamins-Minerals (MULTIVITAMIN WITH MINERALS) tablet Take 1 tablet by mouth daily.     nitroGLYCERIN (NITROSTAT) 0.4 MG SL tablet Place 0.4 mg under the tongue every 5 (five) minutes as needed for  chest pain.     omeprazole (PRILOSEC) 20 MG capsule Take 20 mg by mouth daily.     prochlorperazine (COMPAZINE) 10 MG tablet Take 1 tablet (10 mg total) by mouth every 6 (six) hours as needed for nausea or vomiting. 30 tablet 0   sacubitril-valsartan (ENTRESTO) 49-51 MG Take 0.5 tablets by mouth 2 (two) times daily.     No current facility-administered medications for this visit.    SURGICAL HISTORY:  Past Surgical History:  Procedure Laterality Date   BRONCHIAL BIOPSY  06/04/2021   Procedure: BRONCHIAL BIOPSIES;  Surgeon: Garner Nash, DO;  Location: [facility];  Service: Pulmonary;;   BRONCHIAL BRUSHINGS  06/04/2021   Procedure: BRONCHIAL BRUSHINGS;  Surgeon: Garner Nash, DO;  Location: [facility];  Service: Pulmonary;;   BRONCHIAL NEEDLE ASPIRATION BIOPSY  06/04/2021   Procedure: BRONCHIAL NEEDLE ASPIRATION BIOPSIES;  Surgeon: Garner Nash, DO;  Location: [facility];  Service: Pulmonary;;   BRONCHIAL WASHINGS  06/04/2021   Procedure: BRONCHIAL WASHINGS;  Surgeon: Garner Nash, DO;  Location: [facility];  Service: Pulmonary;;   CARDIAC SURGERY     COLONOSCOPY     CORONARY ARTERY BYPASS GRAFT     2019 at [facility] Right 06/04/2021   Procedure: [place];  Surgeon: Garner Nash, DO;  Location: [facility];  Service: Pulmonary;  Laterality: Right;   VIDEO BRONCHOSCOPY WITH ENDOBRONCHIAL ULTRASOUND  06/04/2021   Procedure: VIDEO BRONCHOSCOPY WITH ENDOBRONCHIAL ULTRASOUND;  Surgeon: Garner Nash, DO;  Location: [facility];  Service: Pulmonary;;    REVIEW OF SYSTEMS:   Review of Systems  Constitutional: Negative for appetite change, chills, fatigue, fever and unexpected weight change.  HENT:   Negative for mouth sores, nosebleeds, sore throat and trouble swallowing.   Eyes: Negative for eye problems and icterus.  Respiratory: Negative for cough, hemoptysis, shortness of breath and wheezing.   Cardiovascular: Negative for chest pain and leg swelling.  Gastrointestinal: Negative for abdominal pain, constipation, diarrhea, nausea and vomiting.  Genitourinary: Negative for bladder incontinence, difficulty urinating, dysuria, frequency and hematuria.   Musculoskeletal: Negative for back pain, gait problem, neck pain and neck stiffness.  Skin: Negative for itching and rash.  Neurological: Negative for dizziness, extremity weakness, gait problem, headaches, light-headedness and seizures.  Hematological:  Negative for adenopathy. Does not bruise/bleed easily.  Psychiatric/Behavioral: Negative for confusion, depression and sleep disturbance. The patient is not nervous/anxious.     PHYSICAL EXAMINATION:  There were no vitals taken for this visit.  ECOG PERFORMANCE STATUS: {CHL ONC ECOG [id number]  Physical Exam  Constitutional: Oriented to person, place, and time and well-developed, well-nourished, and in no distress. No distress.  HENT:  Head: Normocephalic and atraumatic.  Mouth/Throat: Oropharynx is clear and moist. No oropharyngeal exudate.  Eyes: Conjunctivae are normal. Right eye exhibits no discharge. Left eye exhibits no discharge. No scleral icterus.  Neck: Normal range of motion. Neck supple.  Cardiovascular: Normal rate, regular rhythm, normal heart sounds and intact distal pulses.   Pulmonary/Chest: Effort normal and breath sounds normal. No respiratory distress. No wheezes. No rales.  Abdominal: Soft. Bowel sounds are normal. Exhibits no distension and no mass. There is no tenderness.  Musculoskeletal: Normal range of motion. Exhibits no edema.  Lymphadenopathy:    No cervical adenopathy.  Neurological: Alert and oriented to person, place, and time. Exhibits normal muscle tone. Gait normal. Coordination normal.  Skin: Skin is warm and dry. No rash  noted. Not diaphoretic. No erythema. No pallor.  Psychiatric: Mood, memory and judgment normal.  Vitals reviewed.  LABORATORY DATA: Lab Results  Component Value Date   WBC 7.1 11/04/2021   HGB 11.9 (L) 11/04/2021   HCT 35.3 (L) 11/04/2021   MCV 95.4 11/04/2021   PLT 214 11/04/2021      Chemistry      Component Value Date/Time   NA 142 11/04/2021 1246   K 4.5 11/04/2021 1246   CL 112 (H) 11/04/2021 1246   CO2 24 11/04/2021 1246   BUN 23 11/04/2021 1246   CREATININE 1.41 (H) 11/04/2021 1246      Component Value Date/Time   CALCIUM 9.0 11/04/2021 1246   ALKPHOS 124 11/04/2021 1246   AST 23 11/04/2021 1246   ALT  11 11/04/2021 1246   BILITOT 0.2 (L) 11/04/2021 1246       RADIOGRAPHIC STUDIES:  CT Chest W Contrast  Result Date: 10/23/2021 CLINICAL DATA:  Primary Cancer Type: Lung Imaging Indication: Assess response to therapy Interval therapy since last imaging? Yes Initial Cancer Diagnosis Date: 06/04/2021; Established by: Biopsy-proven Detailed Pathology: Stage IIIB non-small cell lung cancer, adenocarcinoma. Primary Tumor location: Right lower lobe. Surgeries: CABG. Chemotherapy: Yes; Ongoing? Yes; Most recent administration: 09/24/2021 Immunotherapy?  Yes; Type: Nivolumab; Ongoing? Yes Radiation therapy? No EXAM: CT CHEST WITH CONTRAST TECHNIQUE: Multidetector CT imaging of the chest was performed during intravenous contrast administration. CONTRAST:  50m OMNIPAQUE IOHEXOL 350 MG/ML SOLN COMPARISON:  Most recent CT chest 05/30/2021.  05/31/2021 PET-CT. FINDINGS: Cardiovascular: Post CABG Mediastinum/Nodes: RIGHT lower paratracheal node measures 1.1 cm compared to 1.1 cm on prior. No supraclavicular nodes. Lungs/Pleura: RIGHT lower lobe consolidation with air bronchograms measuring 7.6 by 5.1 cm compares to 7.4 x 5.1 cm on comparison CT. No significant change. No new nodularity within the lungs. Upper Abdomen: Limited view of the liver, kidneys, pancreas are unremarkable. Normal adrenal glands. Musculoskeletal: No aggressive osseous lesion. IMPRESSION: 1. Stable masslike consolidation with air bronchograms in the RIGHT lower lobe. 2. Stable mildly enlarged RIGHT lower paratracheal lymph node. 3. No new pulmonary nodularity. 4. No new adenopathy. Electronically Signed   By: SSuzy BouchardM.D.   On: 10/23/2021 11:33     ASSESSMENT/PLAN:  This is a very pleasant 75year old African-American male diagnosed with stage IIIb (T3b, N2, M0) non-small cell lung cancer, adenocarcinoma.  He presented with a large right upper lobe lung mass and suspicious right lower lobe paratracheal lymphadenopathy.  He was  diagnosed in June 2022.  The patient's molecular studies by guardant 360 did not show any actionable mutations.  Brain MRI  The patient completed 3 cycles of neoadjuvant chemotherapy according to the Checkmate 816 trial with carboplatin for an AUC of 5, Alimta 500 mg per metered square, and nivolumab 360 mg IV every 3 weeks.  The patient status post 3 cycles.  His last dose was on 09/24/2021.  There was not appreciable improvement in his disease after neoadjuvant treatment in order to proceed with surgery. Therefore, Dr. MJulien Nordmannrecommended chemoradiation. He is here today before starting cycle #1 of carboplatin for an AUC of 2 and paclitaxel 45 mg/m2.   There was some [place] in [place]. [place] to see them on ***  The patient was seen with Dr. MJulien Nordmann Labs were reviewed. Recommend ***.   F/U.   MRI  The patient was advised to call immediately if he has any concerning symptoms in the interval. The patient voices  understanding of current disease status and treatment options and is in agreement with the current care plan. All questions were answered. The patient knows to call the clinic with any problems, questions or concerns. We can certainly see the patient much sooner if necessary   No orders of the defined types were placed in this encounter.    I spent {CHL ONC TIME VISIT - KIYJG:[id number]} counseling the patient face to face. The total time spent in the appointment was {CHL ONC TIME VISIT - GYBNL:[id number]}.  Pravin Perezperez L Karryn Kosinski, PA-C 11/05/21

## 2021-11-08 MED FILL — Dexamethasone Sodium Phosphate Inj 100 MG/10ML: INTRAMUSCULAR | Qty: 1 | Status: AC

## 2021-11-11 ENCOUNTER — Other Ambulatory Visit: Payer: No Typology Code available for payment source

## 2021-11-11 ENCOUNTER — Ambulatory Visit: Payer: No Typology Code available for payment source

## 2021-11-11 ENCOUNTER — Ambulatory Visit: Payer: No Typology Code available for payment source | Admitting: Physician Assistant

## 2021-11-11 ENCOUNTER — Other Ambulatory Visit: Payer: Self-pay

## 2021-11-12 ENCOUNTER — Telehealth: Payer: Self-pay | Admitting: *Deleted

## 2021-11-12 ENCOUNTER — Telehealth: Payer: Self-pay | Admitting: Internal Medicine

## 2021-11-12 ENCOUNTER — Encounter: Payer: Self-pay | Admitting: *Deleted

## 2021-11-12 NOTE — Progress Notes (Signed)
Per [facility] nurse navigator, Cassandria Santee, I secure emailed her the patient last medical note and the order. I notified rad onc to follow up on referral.

## 2021-11-12 NOTE — Telephone Encounter (Signed)
Spoke to patient to give upcoming appointment for Brain MRI, gave time and location of appointment, patient voiced understanding

## 2021-11-12 NOTE — Telephone Encounter (Signed)
I followed up to see if Mr. Paul [name] rad onc and mri brain are authorized and they are still not.  I called [facility] and was told I needed to talk to community care.  I spoke to them and was told I needed to talk to nurse navigator Tabatha at [phone].  I was unable to reach but did leave a vm message with my name and phone number to call.

## 2021-11-13 NOTE — Progress Notes (Signed)
Location of tumor and Histology per Pathology Report: right lung  Biopsy:  A. LUNG, RLL, BRUSH:  -  Malignant cells consistent with adenocarcinoma  -  See comment   B. LUNG, RLL, FINE NEEDLE ASPIRATION:  -  Malignant cells consistent with adenocarcinoma  D. LUNG, RLL, LAVAGE: - Malignant cells consistent with adenocarcinoma  E. LYMPH NODE, 7, FINE NEEDLE ASPIRATION:  -  Atypical cells present   Past/Anticipated interventions by surgeon, if any:   Surgeon: Garner Nash, DO  Operation: Flexible video fiberoptic bronchoscopy with electromagnetic navigation and biopsies.  Past/Anticipated interventions by medical oncology, if any: Dr Julien Nordmann PRIOR THERAPY: None.   CURRENT THERAPY: Neoadjuvant systemic chemotherapy according to the Checkmate 816 with carboplatin for AUC of 5, Alimta 500 Mg/M2 and nivolumab 360 Mg IV every 3 weeks for 3 cycles.  First dose August 06, 2021.    Pain issues, if any:  yes, left shoulder pain constant and aching  SAFETY ISSUES: Prior radiation? no Pacemaker/ICD? no Possible current pregnancy? no Is the patient on methotrexate? no  Current Complaints / other details:  none     Vitals:   11/20/21 1414  BP: 138/64  Pulse: 73  Resp: (!) 24  Temp: 97.9 F (36.6 C)  SpO2: 100%  Weight: 254 lb (115.2 kg)  Height: 5\' 8"  (1.727 m)

## 2021-11-14 ENCOUNTER — Ambulatory Visit (HOSPITAL_COMMUNITY)
Admission: RE | Admit: 2021-11-14 | Discharge: 2021-11-14 | Disposition: A | Discharge status: Home / Self Care | Payer: No Typology Code available for payment source | Source: Ambulatory Visit | Attending: Internal Medicine | Admitting: Internal Medicine

## 2021-11-14 DIAGNOSIS — C3490 Malignant neoplasm of unspecified part of unspecified bronchus or lung: Secondary | ICD-10-CM | POA: Insufficient documentation

## 2021-11-14 MED ORDER — GADOBUTROL 1 MMOL/ML IV SOLN
10.0000 mL | Freq: Once | INTRAVENOUS | Status: AC | PRN
  Administered 2021-11-14: 14:00:00 10 mL via INTRAVENOUS

## 2021-11-15 MED FILL — Dexamethasone Sodium Phosphate Inj 100 MG/10ML: INTRAMUSCULAR | Qty: 1 | Status: AC

## 2021-11-18 ENCOUNTER — Other Ambulatory Visit: Payer: Self-pay

## 2021-11-18 ENCOUNTER — Inpatient Hospital Stay: Payer: No Typology Code available for payment source

## 2021-11-18 VITALS — BP 153/70 | HR 65 | Temp 98.0°F | Resp 24 | Wt 254.1 lb

## 2021-11-18 DIAGNOSIS — C3491 Malignant neoplasm of unspecified part of right bronchus or lung: Secondary | ICD-10-CM

## 2021-11-18 DIAGNOSIS — Z5111 Encounter for antineoplastic chemotherapy: Secondary | ICD-10-CM | POA: Diagnosis not present

## 2021-11-18 LAB — CBC WITH DIFFERENTIAL (CANCER CENTER ONLY)
Abs Immature Granulocytes: 0.02 10*3/uL (ref 0.00–0.07)
Basophils Absolute: 0.1 10*3/uL (ref 0.0–0.1)
Basophils Relative: 1 %
Eosinophils Absolute: 0.3 10*3/uL (ref 0.0–0.5)
Eosinophils Relative: 4 %
HCT: 37.7 % — ABNORMAL LOW (ref 39.0–52.0)
Hemoglobin: 12.3 g/dL — ABNORMAL LOW (ref 13.0–17.0)
Immature Granulocytes: 0 %
Lymphocytes Relative: 30 %
Lymphs Abs: 2 10*3/uL (ref 0.7–4.0)
MCH: 31.6 pg (ref 26.0–34.0)
MCHC: 32.6 g/dL (ref 30.0–36.0)
MCV: 96.9 fL (ref 80.0–100.0)
Monocytes Absolute: 1 10*3/uL (ref 0.1–1.0)
Monocytes Relative: 15 %
Neutro Abs: 3.3 10*3/uL (ref 1.7–7.7)
Neutrophils Relative %: 50 %
Platelet Count: 190 10*3/uL (ref 150–400)
RBC: 3.89 MIL/uL — ABNORMAL LOW (ref 4.22–5.81)
RDW: 16.8 % — ABNORMAL HIGH (ref 11.5–15.5)
WBC Count: 6.6 10*3/uL (ref 4.0–10.5)
nRBC: 0 % (ref 0.0–0.2)

## 2021-11-18 LAB — CMP (CANCER CENTER ONLY)
ALT: 12 U/L (ref 0–44)
AST: 20 U/L (ref 15–41)
Albumin: 3.4 g/dL — ABNORMAL LOW (ref 3.5–5.0)
Alkaline Phosphatase: 106 U/L (ref 38–126)
Anion gap: 8 (ref 5–15)
BUN: 20 mg/dL (ref 8–23)
CO2: 22 mmol/L (ref 22–32)
Calcium: 9.1 mg/dL (ref 8.9–10.3)
Chloride: 112 mmol/L — ABNORMAL HIGH (ref 98–111)
Creatinine: 1.26 mg/dL — ABNORMAL HIGH (ref 0.61–1.24)
GFR, Estimated: 59 mL/min — ABNORMAL LOW (ref >60)
Glucose, Bld: 171 mg/dL — ABNORMAL HIGH (ref 70–99)
Potassium: 4.5 mmol/L (ref 3.5–5.1)
Sodium: 142 mmol/L (ref 135–145)
Total Bilirubin: 0.3 mg/dL (ref 0.3–1.2)
Total Protein: 6.6 g/dL (ref 6.5–8.1)

## 2021-11-18 MED ORDER — SODIUM CHLORIDE 0.9 % IV SOLN: Freq: Once | INTRAVENOUS | Status: AC

## 2021-11-18 MED ORDER — DIPHENHYDRAMINE HCL 50 MG/ML IJ SOLN
50.0000 mg | Freq: Once | INTRAMUSCULAR | Status: AC
  Administered 2021-11-18: 50 mg via INTRAVENOUS
  Filled 2021-11-18: qty 1

## 2021-11-18 MED ORDER — PALONOSETRON HCL INJECTION 0.25 MG/5ML
0.2500 mg | Freq: Once | INTRAVENOUS | Status: AC
  Administered 2021-11-18: 0.25 mg via INTRAVENOUS
  Filled 2021-11-18: qty 5

## 2021-11-18 MED ORDER — SODIUM CHLORIDE 0.9 % IV SOLN
45.0000 mg/m2 | Freq: Once | INTRAVENOUS | Status: AC
  Administered 2021-11-18: 102 mg via INTRAVENOUS
  Filled 2021-11-18: qty 17

## 2021-11-18 MED ORDER — SODIUM CHLORIDE 0.9 % IV SOLN
10.0000 mg | Freq: Once | INTRAVENOUS | Status: AC
  Administered 2021-11-18: 10 mg via INTRAVENOUS
  Filled 2021-11-18: qty 10
  Filled 2021-11-18: qty 1

## 2021-11-18 MED ORDER — FAMOTIDINE 20 MG IN NS 100 ML IVPB
20.0000 mg | Freq: Once | INTRAVENOUS | Status: AC
Start: 2021-11-18 — End: 2021-11-18
  Administered 2021-11-18: 20 mg via INTRAVENOUS
  Filled 2021-11-18: qty 100

## 2021-11-18 MED ORDER — SODIUM CHLORIDE 0.9 % IV SOLN
220.0000 mg | Freq: Once | INTRAVENOUS | Status: AC
  Administered 2021-11-18: 220 mg via INTRAVENOUS
  Filled 2021-11-18: qty 22

## 2021-11-18 NOTE — Progress Notes (Signed)
Ok to proceed with premedications without waiting on CMET per Dr Julien Nordmann.

## 2021-11-18 NOTE — Patient Instructions (Signed)
[facility]  Discharge Instructions: Thank you for choosing [facility] to provide your oncology and hematology care.   If you have a lab appointment with the [facility], please go directly to the [facility] and check in at the registration area.   Wear comfortable clothing and clothing appropriate for easy access to any Portacath or PICC line.   We strive to give you quality time with your provider. You may need to reschedule your appointment if you arrive late (15 or more minutes).  Arriving late affects you and other patients whose appointments are after yours.  Also, if you miss three or more appointments without notifying the office, you may be dismissed from the clinic at the provider's discretion.      For prescription refill requests, have your pharmacy contact our office and allow 72 hours for refills to be completed.    Today you received the following chemotherapy and/or immunotherapy agents :  Paclitaxel & Carboplatin      To help prevent nausea and vomiting after your treatment, we encourage you to take your nausea medication as directed.  BELOW ARE SYMPTOMS THAT SHOULD BE REPORTED IMMEDIATELY: *FEVER GREATER THAN 100.4 F (38 C) OR HIGHER *CHILLS OR SWEATING *NAUSEA AND VOMITING THAT IS NOT CONTROLLED WITH YOUR NAUSEA MEDICATION *UNUSUAL SHORTNESS OF BREATH *UNUSUAL BRUISING OR BLEEDING *URINARY PROBLEMS (pain or burning when urinating, or frequent urination) *BOWEL PROBLEMS (unusual diarrhea, constipation, pain near the anus) TENDERNESS IN MOUTH AND THROAT WITH OR WITHOUT PRESENCE OF ULCERS (sore throat, sores in mouth, or a toothache) UNUSUAL RASH, SWELLING OR PAIN  UNUSUAL VAGINAL DISCHARGE OR ITCHING   Items with * indicate a potential emergency and should be followed up as soon as possible or go to the Emergency Department if any problems should occur.  Please show the CHEMOTHERAPY ALERT CARD or IMMUNOTHERAPY ALERT  CARD at check-in to the Emergency Department and triage nurse.  Should you have questions after your visit or need to cancel or reschedule your appointment, please contact [facility]  Dept: [phone]  and follow the prompts.  Office hours are 8:00 a.m. to 4:30 p.m. Monday - Friday. Please note that voicemails left after 4:00 p.m. may not be returned until the following business day.  We are closed weekends and major holidays. You have access to a nurse at all times for urgent questions. Please call the main number to the clinic Dept: [phone] and follow the prompts.   For any non-urgent questions, you may also contact your provider using MyChart. We now offer e-Visits for anyone 79 and older to request care online for non-urgent symptoms. For details visit [email or web address].   Also download the MyChart app! Go to the app store, search "MyChart", open the app, select [facility], and log in with your MyChart username and password.  Due to Covid, a mask is required upon entering the hospital/clinic. If you do not have a mask, one will be given to you upon arrival. For doctor visits, patients may have 1 support person aged 44 or older with them. For treatment visits, patients cannot have anyone with them due to current Covid guidelines and our immunocompromised population.   Paclitaxel injection What is this medication? PACLITAXEL (PAK li TAX el) is a chemotherapy drug. It targets fast dividing cells, like cancer cells, and causes these cells to die. This medicine is used to treat ovarian cancer, breast cancer, lung cancer, Kaposi's sarcoma, and other  cancers. This medicine may be used for other purposes; ask your health care provider or pharmacist if you have questions. COMMON BRAND NAME(S): Onxol, Taxol What should I tell my care team before I take this medication? They need to know if you have any of these conditions: history of irregular heartbeat liver  disease low blood counts, like low white cell, platelet, or red cell counts lung or breathing disease, like asthma tingling of the fingers or toes, or other nerve disorder an unusual or allergic reaction to paclitaxel, alcohol, polyoxyethylated castor oil, other chemotherapy, other medicines, foods, dyes, or preservatives pregnant or trying to get pregnant breast-feeding How should I use this medication? This drug is given as an infusion into a vein. It is administered in a hospital or clinic by a specially trained health care professional. Talk to your pediatrician regarding the use of this medicine in children. Special care may be needed. Overdosage: If you think you have taken too much of this medicine contact a poison control center or emergency room at once. NOTE: This medicine is only for you. Do not share this medicine with others. What if I miss a dose? It is important not to miss your dose. Call your doctor or health care professional if you are unable to keep an appointment. What may interact with this medication? Do not take this medicine with any of the following medications: live virus vaccines This medicine may also interact with the following medications: antiviral medicines for hepatitis, HIV or AIDS certain antibiotics like erythromycin and clarithromycin certain medicines for fungal infections like ketoconazole and itraconazole certain medicines for seizures like carbamazepine, phenobarbital, phenytoin gemfibrozil nefazodone rifampin St. John's wort This list may not describe all possible interactions. Give your health care provider a list of all the medicines, herbs, non-prescription drugs, or dietary supplements you use. Also tell them if you smoke, drink alcohol, or use illegal drugs. Some items may interact with your medicine. What should I watch for while using this medication? Your condition will be monitored carefully while you are receiving this medicine. You  will need important blood work done while you are taking this medicine. This medicine can cause serious allergic reactions. To reduce your risk you will need to take other medicine(s) before treatment with this medicine. If you experience allergic reactions like skin rash, itching or hives, swelling of the face, lips, or tongue, tell your doctor or health care professional right away. In some cases, you may be given additional medicines to help with side effects. Follow all directions for their use. This drug may make you feel generally unwell. This is not uncommon, as chemotherapy can affect healthy cells as well as cancer cells. Report any side effects. Continue your course of treatment even though you feel ill unless your doctor tells you to stop. Call your doctor or health care professional for advice if you get a fever, chills or sore throat, or other symptoms of a cold or flu. Do not treat yourself. This drug decreases your body's ability to fight infections. Try to avoid being around people who are sick. This medicine may increase your risk to bruise or bleed. Call your doctor or health care professional if you notice any unusual bleeding. Be careful brushing and flossing your teeth or using a toothpick because you may get an infection or bleed more easily. If you have any dental work done, tell your dentist you are receiving this medicine. Avoid taking products that contain aspirin, acetaminophen, ibuprofen, naproxen, or ketoprofen  unless instructed by your doctor. These medicines may hide a fever. Do not become pregnant while taking this medicine. Women should inform their doctor if they wish to become pregnant or think they might be pregnant. There is a potential for serious side effects to an unborn child. Talk to your health care professional or pharmacist for more information. Do not breast-feed an infant while taking this medicine. Men are advised not to father a child while receiving this  medicine. This product may contain alcohol. Ask your pharmacist or healthcare provider if this medicine contains alcohol. Be sure to tell all healthcare providers you are taking this medicine. Certain medicines, like metronidazole and disulfiram, can cause an unpleasant reaction when taken with alcohol. The reaction includes flushing, headache, nausea, vomiting, sweating, and increased thirst. The reaction can last from 30 minutes to several hours. What side effects may I notice from receiving this medication? Side effects that you should report to your doctor or health care professional as soon as possible: allergic reactions like skin rash, itching or hives, swelling of the face, lips, or tongue breathing problems changes in vision fast, irregular heartbeat high or low blood pressure mouth sores pain, tingling, numbness in the hands or feet signs of decreased platelets or bleeding - bruising, pinpoint red spots on the skin, black, tarry stools, blood in the urine signs of decreased red blood cells - unusually weak or tired, feeling faint or lightheaded, falls signs of infection - fever or chills, cough, sore throat, pain or difficulty passing urine signs and symptoms of liver injury like dark yellow or brown urine; general ill feeling or flu-like symptoms; light-colored stools; loss of appetite; nausea; right upper belly pain; unusually weak or tired; yellowing of the eyes or skin swelling of the ankles, feet, hands unusually slow heartbeat Side effects that usually do not require medical attention (report to your doctor or health care professional if they continue or are bothersome): diarrhea hair loss loss of appetite muscle or joint pain nausea, vomiting pain, redness, or irritation at site where injected tiredness This list may not describe all possible side effects. Call your doctor for medical advice about side effects. You may report side effects to FDA at 1-800-FDA-1088. Where  should I keep my medication? This drug is given in a hospital or clinic and will not be stored at home. NOTE: This sheet is a summary. It may not cover all possible information. If you have questions about this medicine, talk to your doctor, pharmacist, or health care provider.  2022 Elsevier/Gold Standard (2021-09-03 00:00:00)

## 2021-11-19 NOTE — Progress Notes (Signed)
Radiation Oncology         [phone] ________________________________  Initial Outpatient Consultation  Name: Paul [name] MRN: [id number]  Date: 11/20/2021  DOB: October 29, 1946  PF:[id number], Andria Rhein, MD   REFERRING PHYSICIAN: Curt Bears, MD  DIAGNOSIS: The encounter diagnosis was Adenocarcinoma of right lung, stage 3 ([place]).  Stage III-B Adenocarcinoma of the RLL, (cT3, cN2, cM0)   HISTORY OF PRESENT ILLNESS::Paul [name] is a 75 y.o. male current smoker who is accompanied by no one. he is seen as a courtesy of Dr. Julien Nordmann for an opinion concerning radiation therapy as part of management for his recently diagnosed lung cancer.  The patient was initially followed by his primary care physician at the [facility] clinic in [facility] and was noted on physical exam to have rales in his lung.  Abnormal exam prompted CXR  on 01/06/21 which showed chronic bibasilar infiltrate, right greater than left, present since September 2020. CXR also showed a small chronic right pleural effusion.   He was then referred to Dr. Valeta Harms for evaluation of his condition and consideration of bronchoscopy.  CT super D of the chest without contrast on 05/30/2021 showed consolidation in the medial right lower lobe with air bronchogram's possibly suggestive of neoplasm vs pulmonary infection. The consolidative mass/process was seen to measure 7.5 x 5.1 cm.  There was also right lower lobe paratracheal lymph node measuring 1.0  PET scan on 05/31/21 showed the progressive consolidated region of the right lower lobe with a maximum SUV of 6.3, and the enlarging area of consolidation anteriorly in the left lower lobe with a maximum SUV of 5.0. Differentials were noted to include granulomatous consolidation or malignancy.  Pet also showed a faintly hypermetabolic borderline enlarged right lower lobe paratracheal lymph node  On 06/04/2021, the patient underwent video bronchoscopy with  electromagnetic navigation procedure, endobronchial ultrasound, and biopsy of the right lower lobe consolidation under the care of Dr. Valeta Harms. Pathology from the procedure revealed malignant cells consistent with adenocarcinoma.  Dr. Valeta Harms then referred the patient to Dr. Julien Nordmann on 06/17/21 for evaluation and recommendation regarding treatment of his condition. Following discussion of the risks and benefits, the patient opted to proceed with  starting treatment consisting of neoadjuvant systemic chemotherapy with carboplatin and nivolumab.  First dose 08/13/21. (Started in August because it took a while for authorization from the [place] to arrive to enable him to receive treatment at [facility]).   Of note: the patietn presented to the ED and was admitted on 06/22/21 due to hyperglycemia. The patients blood glucose was elevated to 1264 when checked in the ED and he was found to be in a  hyperosmolar hyperglycemic state.  Accordingly, the patient was started on on IV insulin and IV fluids. The patient was deemed stable for discharge on 06/24/21.   The patient was felt to be a potential candidate for neoadjuvant treatment followed by potential resection.  He was enrolled in the Checkmate 816 study with carboplatin for AUC of 5, Alimta 500 Mg/M2 and nivolumab 360 Mg IV every 3 weeks.   The patient completed neoadjuvant chemotherapy on 09/24/21 which he tolerated quite well without any concerning adverse side effects except for some thrombophlebitis that he developed on his left hand at the site of his last chemotherapy infusion.   Chest CT on 10/21/21 demonstrated the stable appearance of the mass-like consolidation with air bronchograms in the right lower lobe. The mildly enlarged right lower paratracheal lymph node appeared stable as  well. No new pulmonary nodularities were identified.   Given the lack of significant findings, the patient was not felt to be a candidate for surgical resection.  It was  recommended that the patient proceed with conventional treatment for stage IIIb lung cancer that being carboplatin for an AUC of 2 and taxol 45 mg/m2 along with chest radiation therapy.  Patient is now referred to radiation oncology for consideration for chest radiation therapy.  MRI of the brain on 11/14/21 demonstrated no evidence of intracranial metastases.     PREVIOUS RADIATION THERAPY: No  PAST MEDICAL HISTORY:  Past Medical History:  Diagnosis Date   CHF (congestive heart failure) ([place])    Coronary artery disease    Diabetes mellitus without complication ([place])    Hypertension    Hypothyroidism    Ischemic cardiomyopathy    Pneumonia    a long time ago   Sleep apnea    no longer uses a cpap    PAST SURGICAL HISTORY: Past Surgical History:  Procedure Laterality Date   BRONCHIAL BIOPSY  06/04/2021   Procedure: BRONCHIAL BIOPSIES;  Surgeon: Garner Nash, DO;  Location: [facility];  Service: Pulmonary;;   BRONCHIAL BRUSHINGS  06/04/2021   Procedure: BRONCHIAL BRUSHINGS;  Surgeon: Garner Nash, DO;  Location: [place];  Service: Pulmonary;;   BRONCHIAL NEEDLE ASPIRATION BIOPSY  06/04/2021   Procedure: BRONCHIAL NEEDLE ASPIRATION BIOPSIES;  Surgeon: Garner Nash, DO;  Location: [facility];  Service: Pulmonary;;   BRONCHIAL WASHINGS  06/04/2021   Procedure: BRONCHIAL WASHINGS;  Surgeon: Garner Nash, DO;  Location: [facility];  Service: Pulmonary;;   CARDIAC SURGERY     COLONOSCOPY     CORONARY ARTERY BYPASS GRAFT     2019 at [facility] Right 06/04/2021   Procedure: [place];  Surgeon: Garner Nash, DO;  Location: [facility];  Service: Pulmonary;  Laterality: Right;   VIDEO BRONCHOSCOPY WITH ENDOBRONCHIAL ULTRASOUND  06/04/2021   Procedure: VIDEO BRONCHOSCOPY WITH ENDOBRONCHIAL ULTRASOUND;  Surgeon: Garner Nash, DO;  Location: [facility];  Service:  Pulmonary;;    FAMILY HISTORY:  Family History  Family history unknown: Yes    SOCIAL HISTORY:  Social History   Tobacco Use   Smoking status: Some Days    Packs/day: 0.50    Types: Cigarettes    Start date: 1965   Smokeless tobacco: Never   Tobacco comments:    Started smoking in [place] age 12  Vaping Use   Vaping Use: Never used  Substance Use Topics   Alcohol use: No   Drug use: Never    ALLERGIES: No Known Allergies  MEDICATIONS:  Current Outpatient Medications  Medication Sig Dispense Refill   aspirin 81 MG chewable tablet Take 81 mg by mouth daily.     blood glucose meter kit and supplies KIT Dispense based on patient and insurance preference. Use up to four times daily as directed. 1 each 0   carvedilol (COREG) 25 MG tablet Take 25 mg by mouth at bedtime.     folic acid (FOLVITE) 1 MG tablet Take 1 tablet (1 mg total) by mouth daily. 30 tablet 4   furosemide (LASIX) 20 MG tablet Take 20 mg by mouth at bedtime.     insulin glargine (LANTUS) 100 UNIT/ML Solostar Pen Inject 10 Units into the skin daily. 5 mL 5   levothyroxine (SYNTHROID) 25 MCG tablet Take 25 mcg by mouth at bedtime.  Menthol-Methyl Salicylate (MUSCLE RUB) 10-15 % CREA Apply 1 application topically daily as needed for muscle pain.     metFORMIN (GLUCOPHAGE) 500 MG tablet Take 1 tablet (500 mg total) by mouth 2 (two) times daily with a meal. 60 tablet 3   Multiple Vitamins-Minerals (MULTIVITAMIN WITH MINERALS) tablet Take 1 tablet by mouth daily.     nitroGLYCERIN (NITROSTAT) 0.4 MG SL tablet Place 0.4 mg under the tongue every 5 (five) minutes as needed for chest pain.     prochlorperazine (COMPAZINE) 10 MG tablet Take 1 tablet (10 mg total) by mouth every 6 (six) hours as needed for nausea or vomiting. 30 tablet 0   sacubitril-valsartan (ENTRESTO) 49-51 MG Take 0.5 tablets by mouth 2 (two) times daily.     omeprazole (PRILOSEC) 20 MG capsule Take 20 mg by mouth daily. (Patient not taking: Reported  on 11/20/2021)     No current facility-administered medications for this encounter.    REVIEW OF SYSTEMS:  A 10+ POINT REVIEW OF SYSTEMS WAS OBTAINED including neurology, dermatology, psychiatry, cardiac, respiratory, lymph, extremities, GI, GU, musculoskeletal, constitutional, reproductive, HEENT.  The patient reports having a dry cough after receiving his flu vaccine several days ago.  He denies any hemoptysis or pain within the chest area.  He denies any headaches or visual problems.   PHYSICAL EXAM:  height is 5' 8" (1.727 m) and weight is 254 lb (115.2 kg). His temperature is 97.9 F (36.6 C). His blood pressure is 138/64 and his pulse is 73. His respiration is 24 (abnormal) and oxygen saturation is 100%.   General: Alert and oriented, in no acute distress HEENT: Head is normocephalic. Extraocular movements are intact. Oropharynx is clear.  Teeth are in good repair.  He has some teeth missing.  No secondary infection noted in the oral cavity. Neck: Neck is supple, no palpable cervical or supraclavicular lymphadenopathy. Heart: Regular in rate and rhythm with no murmurs, rubs, or gallops. Chest: Clear to auscultation bilaterally, with no rhonchi, wheezes, or rales. Abdomen: Soft, nontender, nondistended, with no rigidity or guarding. Extremities: No cyanosis or edema. Lymphatics: see Neck Exam Skin: No concerning lesions. Musculoskeletal: symmetric strength and muscle tone throughout. Neurologic: Cranial nerves II through XII are grossly intact. No obvious focalities. Speech is fluent. Coordination is intact. Psychiatric: Judgment and insight are intact. Affect is appropriate.   ECOG = 1  0 - Asymptomatic (Fully active, able to carry on all predisease activities without restriction)  1 - Symptomatic but completely ambulatory (Restricted in physically strenuous activity but ambulatory and able to carry out work of a light or sedentary nature. For example, light housework, office  work)  2 - Symptomatic, <50% in bed during the day (Ambulatory and capable of all self care but unable to carry out any work activities. Up and about more than 50% of waking hours)  3 - Symptomatic, >50% in bed, but not bedbound (Capable of only limited self-care, confined to bed or chair 50% or more of waking hours)  4 - Bedbound (Completely disabled. Cannot carry on any self-care. Totally confined to bed or chair)  5 - Death   Eustace Pen MM, Creech RH, Tormey DC, et al. [phone]). "Toxicity and response criteria of the [facility]". [employer] Oncol. 5 (6): 649-55  LABORATORY DATA:  Lab Results  Component Value Date   WBC 6.6 11/18/2021   HGB 12.3 (L) 11/18/2021   HCT 37.7 (L) 11/18/2021   MCV 96.9 11/18/2021   PLT 190 11/18/2021  NEUTROABS 3.3 11/18/2021   Lab Results  Component Value Date   NA 142 11/18/2021   K 4.5 11/18/2021   CL 112 (H) 11/18/2021   CO2 22 11/18/2021   GLUCOSE 171 (H) 11/18/2021   CREATININE 1.26 (H) 11/18/2021   CALCIUM 9.1 11/18/2021      RADIOGRAPHY: MR BRAIN W WO CONTRAST  Result Date: 11/15/2021 CLINICAL DATA:  Non-small cell lung cancer, staging. EXAM: MRI HEAD WITHOUT AND WITH CONTRAST TECHNIQUE: Multiplanar, multiecho pulse sequences of the brain and surrounding structures were obtained without and with intravenous contrast. CONTRAST:  69m GADAVIST GADOBUTROL 1 MMOL/ML IV SOLN COMPARISON:  None. FINDINGS: Multiple sequences are mildly to moderately motion degraded. Brain: There is no evidence of an acute infarct, intracranial hemorrhage, mass, midline shift, or extra-axial fluid collection. No significant white matter disease is seen for age. There is mild-to-moderate cerebral atrophy. No abnormal enhancement is identified. Vascular: Major intracranial vascular flow voids are preserved. Skull and upper cervical spine: Unremarkable bone marrow signal. Sinuses/Orbits: Unremarkable orbits. Mild mucosal thickening in the paranasal  sinuses. Trace left mastoid fluid. Other: None. IMPRESSION: 1. No evidence of intracranial metastases on this motion degraded study. 2.  Cerebral Atrophy (ICD10-G31.9). Electronically Signed   By: ALogan BoresM.D.   On: 11/15/2021 08:54      IMPRESSION: Stage III-B Adenocarcinoma of the RLL, (cT3, cN2, cM0)   The patient would be a good good candidate for radiation therapy directed at the right lower lung mass and associated lymphadenopathy.  As above he did not have significant response to his neoadjuvant treatment and therefore surgical resection is not possible.  Today, I talked to the patient and about the findings and work-up thus far.  We discussed the natural history of non-small cell lung cancer and general treatment, highlighting the role of radiotherapy in the management.  We discussed the available radiation techniques, and focused on the details of logistics and delivery.  We reviewed the anticipated acute and late sequelae associated with radiation in this setting.  The patient was encouraged to ask questions that I answered to the best of my ability.  A patient consent form was discussed and signed.  We retained a copy for our records.  The patient would like to proceed with radiation and will be scheduled for CT simulation.  PLAN: The patient will proceed with CT simulation later today.  He will begin his radiation therapy on November 30.  Anticipate 6 weeks of radiation therapy.  Patient has already started his radiosensitizing chemotherapy and is tolerating this well.   60 minutes of total time was spent for this patient encounter, including preparation, face-to-face counseling with the patient and coordination of care, physical exam, and documentation of the encounter.   ------------------------------------------------  JBlair Promise PhD, MD  This document serves as a record of services personally performed by JGery Pray MD. It was created on his behalf by ERoney Mans a  trained medical scribe. The creation of this record is based on the scribe's personal observations and the provider's statements to them. This document has been checked and approved by the attending provider.

## 2021-11-20 ENCOUNTER — Encounter: Payer: Self-pay | Admitting: Radiation Oncology

## 2021-11-20 ENCOUNTER — Ambulatory Visit
Admission: RE | Admit: 2021-11-20 | Discharge: 2021-11-20 | Disposition: A | Discharge status: Home / Self Care | Payer: No Typology Code available for payment source | Source: Ambulatory Visit | Attending: Radiation Oncology | Admitting: Radiation Oncology

## 2021-11-20 ENCOUNTER — Other Ambulatory Visit: Payer: Self-pay

## 2021-11-20 ENCOUNTER — Encounter: Payer: Self-pay | Admitting: Internal Medicine

## 2021-11-20 ENCOUNTER — Telehealth: Payer: Self-pay | Admitting: *Deleted

## 2021-11-20 VITALS — BP 138/64 | HR 73 | Temp 97.9°F | Resp 24 | Ht 68.0 in | Wt 254.0 lb

## 2021-11-20 DIAGNOSIS — G4730 Sleep apnea, unspecified: Secondary | ICD-10-CM | POA: Diagnosis not present

## 2021-11-20 DIAGNOSIS — Z794 Long term (current) use of insulin: Secondary | ICD-10-CM | POA: Insufficient documentation

## 2021-11-20 DIAGNOSIS — I10 Essential (primary) hypertension: Secondary | ICD-10-CM | POA: Insufficient documentation

## 2021-11-20 DIAGNOSIS — I509 Heart failure, unspecified: Secondary | ICD-10-CM | POA: Insufficient documentation

## 2021-11-20 DIAGNOSIS — Z7984 Long term (current) use of oral hypoglycemic drugs: Secondary | ICD-10-CM | POA: Insufficient documentation

## 2021-11-20 DIAGNOSIS — Z510 Encounter for antineoplastic radiation therapy: Secondary | ICD-10-CM | POA: Diagnosis not present

## 2021-11-20 DIAGNOSIS — C3491 Malignant neoplasm of unspecified part of right bronchus or lung: Secondary | ICD-10-CM

## 2021-11-20 DIAGNOSIS — E039 Hypothyroidism, unspecified: Secondary | ICD-10-CM | POA: Diagnosis not present

## 2021-11-20 DIAGNOSIS — F17210 Nicotine dependence, cigarettes, uncomplicated: Secondary | ICD-10-CM | POA: Insufficient documentation

## 2021-11-20 DIAGNOSIS — C3431 Malignant neoplasm of lower lobe, right bronchus or lung: Secondary | ICD-10-CM | POA: Insufficient documentation

## 2021-11-20 DIAGNOSIS — Z7982 Long term (current) use of aspirin: Secondary | ICD-10-CM | POA: Diagnosis not present

## 2021-11-20 DIAGNOSIS — J90 Pleural effusion, not elsewhere classified: Secondary | ICD-10-CM | POA: Insufficient documentation

## 2021-11-20 DIAGNOSIS — I2510 Atherosclerotic heart disease of native coronary artery without angina pectoris: Secondary | ICD-10-CM | POA: Insufficient documentation

## 2021-11-20 DIAGNOSIS — I255 Ischemic cardiomyopathy: Secondary | ICD-10-CM | POA: Insufficient documentation

## 2021-11-20 DIAGNOSIS — Z79899 Other long term (current) drug therapy: Secondary | ICD-10-CM | POA: Diagnosis not present

## 2021-11-20 DIAGNOSIS — E119 Type 2 diabetes mellitus without complications: Secondary | ICD-10-CM | POA: Diagnosis not present

## 2021-11-20 NOTE — Telephone Encounter (Signed)
Called pt to see how he did with his recent treatment & he reports doing well.  He denies any problems & is eating & drinking well.  Reminded of appts today & # to call if needed.

## 2021-11-20 NOTE — Progress Notes (Signed)
See MD note for nursing evaluation. °

## 2021-11-22 ENCOUNTER — Encounter: Payer: Self-pay | Admitting: General Practice

## 2021-11-22 MED FILL — Dexamethasone Sodium Phosphate Inj 100 MG/10ML: INTRAMUSCULAR | Qty: 1 | Status: AC

## 2021-11-22 NOTE — Progress Notes (Signed)
[facility] Psychosocial Distress Screening Clinical Social Work  Clinical Social Work was referred by distress screening protocol.  The patient scored a 5 on the Psychosocial Distress Thermometer which indicates moderate distress. Clinical Social Worker contacted patient by phone to assess for distress and other psychosocial needs.  Called patient, "I have never had this kind of treatment before, the radiation is every day."  He has good support from person he lives with and [place] - no practical or psychosocial concerns at this time.  Briefly described [place] and encouraged him to contact us as needed.    ONCBCN DISTRESS SCREENING 11/20/2021  Screening Type Initial Screening  Distress experienced in past week (1-10) 5  Emotional problem type Adjusting to illness  Information Concerns Type Lack of info about diagnosis;Lack of info about treatment;Lack of info about complementary therapy choices  Physical Problem type Sexual problems;Skin dry/itchy;Swollen arms/legs  Referral to clinical social work Yes    Clinical Social Worker follow up needed: No.  If yes, follow up plan:  Beverely Pace, [place], LCSW Clinical Social Worker Phone:  [phone]

## 2021-11-25 ENCOUNTER — Inpatient Hospital Stay: Payer: No Typology Code available for payment source | Admitting: *Deleted

## 2021-11-25 ENCOUNTER — Inpatient Hospital Stay: Payer: No Typology Code available for payment source

## 2021-11-25 ENCOUNTER — Other Ambulatory Visit: Payer: Self-pay

## 2021-11-25 ENCOUNTER — Inpatient Hospital Stay (HOSPITAL_BASED_OUTPATIENT_CLINIC_OR_DEPARTMENT_OTHER): Payer: No Typology Code available for payment source | Admitting: Internal Medicine

## 2021-11-25 ENCOUNTER — Encounter: Payer: Self-pay | Admitting: Internal Medicine

## 2021-11-25 ENCOUNTER — Encounter: Payer: Self-pay | Admitting: *Deleted

## 2021-11-25 VITALS — BP 159/76 | HR 81 | Temp 96.3°F | Resp 20 | Ht 68.0 in | Wt 254.8 lb

## 2021-11-25 DIAGNOSIS — C3431 Malignant neoplasm of lower lobe, right bronchus or lung: Secondary | ICD-10-CM

## 2021-11-25 DIAGNOSIS — I10 Essential (primary) hypertension: Secondary | ICD-10-CM

## 2021-11-25 DIAGNOSIS — C3491 Malignant neoplasm of unspecified part of right bronchus or lung: Secondary | ICD-10-CM

## 2021-11-25 DIAGNOSIS — Z5111 Encounter for antineoplastic chemotherapy: Secondary | ICD-10-CM | POA: Diagnosis not present

## 2021-11-25 DIAGNOSIS — E119 Type 2 diabetes mellitus without complications: Secondary | ICD-10-CM | POA: Diagnosis not present

## 2021-11-25 LAB — CBC WITH DIFFERENTIAL (CANCER CENTER ONLY)
Abs Immature Granulocytes: 0.04 10*3/uL (ref 0.00–0.07)
Basophils Absolute: 0 10*3/uL (ref 0.0–0.1)
Basophils Relative: 1 %
Eosinophils Absolute: 0.2 10*3/uL (ref 0.0–0.5)
Eosinophils Relative: 3 %
HCT: 38.4 % — ABNORMAL LOW (ref 39.0–52.0)
Hemoglobin: 12.4 g/dL — ABNORMAL LOW (ref 13.0–17.0)
Immature Granulocytes: 1 %
Lymphocytes Relative: 27 %
Lymphs Abs: 1.8 10*3/uL (ref 0.7–4.0)
MCH: 31.4 pg (ref 26.0–34.0)
MCHC: 32.3 g/dL (ref 30.0–36.0)
MCV: 97.2 fL (ref 80.0–100.0)
Monocytes Absolute: 0.4 10*3/uL (ref 0.1–1.0)
Monocytes Relative: 6 %
Neutro Abs: 4.1 10*3/uL (ref 1.7–7.7)
Neutrophils Relative %: 62 %
Platelet Count: 212 10*3/uL (ref 150–400)
RBC: 3.95 MIL/uL — ABNORMAL LOW (ref 4.22–5.81)
RDW: 16 % — ABNORMAL HIGH (ref 11.5–15.5)
WBC Count: 6.6 10*3/uL (ref 4.0–10.5)
nRBC: 0 % (ref 0.0–0.2)

## 2021-11-25 LAB — CMP (CANCER CENTER ONLY)
ALT: 12 U/L (ref 0–44)
AST: 18 U/L (ref 15–41)
Albumin: 3.4 g/dL — ABNORMAL LOW (ref 3.5–5.0)
Alkaline Phosphatase: 109 U/L (ref 38–126)
Anion gap: 9 (ref 5–15)
BUN: 18 mg/dL (ref 8–23)
CO2: 23 mmol/L (ref 22–32)
Calcium: 9.4 mg/dL (ref 8.9–10.3)
Chloride: 111 mmol/L (ref 98–111)
Creatinine: 1.23 mg/dL (ref 0.61–1.24)
GFR, Estimated: >60 mL/min (ref >60)
Glucose, Bld: 143 mg/dL — ABNORMAL HIGH (ref 70–99)
Potassium: 4.5 mmol/L (ref 3.5–5.1)
Sodium: 143 mmol/L (ref 135–145)
Total Bilirubin: 0.3 mg/dL (ref 0.3–1.2)
Total Protein: 6.7 g/dL (ref 6.5–8.1)

## 2021-11-25 MED ORDER — SODIUM CHLORIDE 0.9 % IV SOLN: Freq: Once | INTRAVENOUS | Status: AC

## 2021-11-25 MED ORDER — SODIUM CHLORIDE 0.9 % IV SOLN
10.0000 mg | Freq: Once | INTRAVENOUS | Status: AC
  Administered 2021-11-25: 11:00:00 10 mg via INTRAVENOUS
  Filled 2021-11-25: qty 10
  Filled 2021-11-25: qty 1

## 2021-11-25 MED ORDER — SODIUM CHLORIDE 0.9 % IV SOLN
209.2000 mg | Freq: Once | INTRAVENOUS | Status: AC
  Administered 2021-11-25: 13:00:00 210 mg via INTRAVENOUS
  Filled 2021-11-25: qty 21

## 2021-11-25 MED ORDER — SODIUM CHLORIDE 0.9 % IV SOLN
45.0000 mg/m2 | Freq: Once | INTRAVENOUS | Status: AC
  Administered 2021-11-25: 12:00:00 102 mg via INTRAVENOUS
  Filled 2021-11-25: qty 17

## 2021-11-25 MED ORDER — DIPHENHYDRAMINE HCL 50 MG/ML IJ SOLN
50.0000 mg | Freq: Once | INTRAMUSCULAR | Status: AC
  Administered 2021-11-25: 10:00:00 50 mg via INTRAVENOUS
  Filled 2021-11-25: qty 1

## 2021-11-25 MED ORDER — PALONOSETRON HCL INJECTION 0.25 MG/5ML
0.2500 mg | Freq: Once | INTRAVENOUS | Status: AC
  Administered 2021-11-25: 11:00:00 0.25 mg via INTRAVENOUS
  Filled 2021-11-25: qty 5

## 2021-11-25 MED ORDER — FAMOTIDINE 20 MG IN NS 100 ML IVPB
20.0000 mg | Freq: Once | INTRAVENOUS | Status: AC
  Administered 2021-11-25: 11:00:00 20 mg via INTRAVENOUS
  Filled 2021-11-25: qty 100

## 2021-11-25 NOTE — Patient Instructions (Signed)
[facility] ONCOLOGY  Discharge Instructions: Thank you for choosing [facility] to provide your oncology and hematology care.   If you have a lab appointment with the [facility], please go directly to the [facility] and check in at the registration area.   Wear comfortable clothing and clothing appropriate for easy access to any Portacath or PICC line.   We strive to give you quality time with your provider. You may need to reschedule your appointment if you arrive late (15 or more minutes).  Arriving late affects you and other patients whose appointments are after yours.  Also, if you miss three or more appointments without notifying the office, you may be dismissed from the clinic at the provider's discretion.      For prescription refill requests, have your pharmacy contact our office and allow 72 hours for refills to be completed.    Today you received the following chemotherapy and/or immunotherapy agents: Taxol, Carboplatin.    To help prevent nausea and vomiting after your treatment, we encourage you to take your nausea medication as directed.  BELOW ARE SYMPTOMS THAT SHOULD BE REPORTED IMMEDIATELY: *FEVER GREATER THAN 100.4 F (38 C) OR HIGHER *CHILLS OR SWEATING *NAUSEA AND VOMITING THAT IS NOT CONTROLLED WITH YOUR NAUSEA MEDICATION *UNUSUAL SHORTNESS OF BREATH *UNUSUAL BRUISING OR BLEEDING *URINARY PROBLEMS (pain or burning when urinating, or frequent urination) *BOWEL PROBLEMS (unusual diarrhea, constipation, pain near the anus) TENDERNESS IN MOUTH AND THROAT WITH OR WITHOUT PRESENCE OF ULCERS (sore throat, sores in mouth, or a toothache) UNUSUAL RASH, SWELLING OR PAIN  UNUSUAL VAGINAL DISCHARGE OR ITCHING   Items with * indicate a potential emergency and should be followed up as soon as possible or go to the Emergency Department if any problems should occur.  Please show the CHEMOTHERAPY ALERT CARD or IMMUNOTHERAPY ALERT CARD at  check-in to the Emergency Department and triage nurse.  Should you have questions after your visit or need to cancel or reschedule your appointment, please contact [facility]  Dept: [phone]  and follow the prompts.  Office hours are 8:00 a.m. to 4:30 p.m. Monday - Friday. Please note that voicemails left after 4:00 p.m. may not be returned until the following business day.  We are closed weekends and major holidays. You have access to a nurse at all times for urgent questions. Please call the main number to the clinic Dept: [phone] and follow the prompts.   For any non-urgent questions, you may also contact your provider using MyChart. We now offer e-Visits for anyone 38 and older to request care online for non-urgent symptoms. For details visit [email or web address].   Also download the MyChart app! Go to the app store, search "MyChart", open the app, select [facility], and log in with your MyChart username and password.  Due to Covid, a mask is required upon entering the hospital/clinic. If you do not have a mask, one will be given to you upon arrival. For doctor visits, patients may have 1 support person aged 29 or older with them. For treatment visits, patients cannot have anyone with them due to current Covid guidelines and our immunocompromised population.

## 2021-11-25 NOTE — Progress Notes (Signed)
[facility] [phone]   Fax:([phone]) [place] [address]  DIAGNOSIS: Stage IIIB (T3b, N2, M0) non-small cell lung cancer, adenocarcinoma presented with large right lower lobe lung mass and suspicious right lower paratracheal lymphadenopathy diagnosed in June 2022.  Molecular studies by Guardant 360 showed no actionable mutations  PRIOR THERAPY: Neoadjuvant systemic chemotherapy according to the Checkmate 816 with carboplatin for AUC of 5, Alimta 500 Mg/M2 and nivolumab 360 Mg IV every 3 weeks for 3 cycles.  Last dose 09/24/21.  Status post 3 cycles.    CURRENT THERAPY: Concurrent chemoradiation with carboplatin for an AUC of 2 and paclitaxel 45 mg per metered square.  First dose expected on 11/04/2021.  Status post 1 cycle.  INTERVAL HISTORY: Paul [name] 75 y.o. male returns to the clinic today for follow-up visit.  The patient started his weekly chemotherapy last week.  He is scheduled to start the first fraction of radiotherapy November 27, 2021.  The patient denied having any current chest pain, shortness of breath but has mild cough with no hemoptysis.  He denied having any significant weight loss or night sweats.  He has no nausea, vomiting, diarrhea or constipation.  He has no headache or visual changes.  He denied having any significant fever or chills.  He had MRI of the brain performed recently and he is here for evaluation and discussion of his MRI results before starting cycle #2.  MEDICAL HISTORY: Past Medical History:  Diagnosis Date   CHF (congestive heart failure) ([place])    Coronary artery disease    Diabetes mellitus without complication ([place])    Hypertension    Hypothyroidism    Ischemic cardiomyopathy    Pneumonia    a long time ago   Sleep apnea    no longer uses a cpap    ALLERGIES:  has No Known Allergies.  MEDICATIONS:  Current  Outpatient Medications  Medication Sig Dispense Refill   aspirin 81 MG chewable tablet Take 81 mg by mouth daily.     blood glucose meter kit and supplies KIT Dispense based on patient and insurance preference. Use up to four times daily as directed. 1 each 0   carvedilol (COREG) 25 MG tablet Take 25 mg by mouth at bedtime.     folic acid (FOLVITE) 1 MG tablet Take 1 tablet (1 mg total) by mouth daily. 30 tablet 4   furosemide (LASIX) 20 MG tablet Take 20 mg by mouth at bedtime.     insulin glargine (LANTUS) 100 UNIT/ML Solostar Pen Inject 10 Units into the skin daily. 5 mL 5   levothyroxine (SYNTHROID) 25 MCG tablet Take 25 mcg by mouth at bedtime.     Menthol-Methyl Salicylate (MUSCLE RUB) 10-15 % CREA Apply 1 application topically daily as needed for muscle pain.     metFORMIN (GLUCOPHAGE) 500 MG tablet Take 1 tablet (500 mg total) by mouth 2 (two) times daily with a meal. 60 tablet 3   Multiple Vitamins-Minerals (MULTIVITAMIN WITH MINERALS) tablet Take 1 tablet by mouth daily.     nitroGLYCERIN (NITROSTAT) 0.4 MG SL tablet Place 0.4 mg under the tongue every 5 (five) minutes as needed for chest pain.     omeprazole (PRILOSEC) 20 MG capsule Take 20 mg by mouth daily. (Patient not taking: Reported on 11/20/2021)     prochlorperazine (COMPAZINE) 10 MG tablet Take 1 tablet (10 mg total) by mouth  every 6 (six) hours as needed for nausea or vomiting. 30 tablet 0   sacubitril-valsartan (ENTRESTO) 49-51 MG Take 0.5 tablets by mouth 2 (two) times daily.     No current facility-administered medications for this visit.    SURGICAL HISTORY:  Past Surgical History:  Procedure Laterality Date   BRONCHIAL BIOPSY  06/04/2021   Procedure: BRONCHIAL BIOPSIES;  Surgeon: Garner Nash, DO;  Location: [facility];  Service: Pulmonary;;   BRONCHIAL BRUSHINGS  06/04/2021   Procedure: BRONCHIAL BRUSHINGS;  Surgeon: Garner Nash, DO;  Location: [facility];  Service: Pulmonary;;   BRONCHIAL NEEDLE  ASPIRATION BIOPSY  06/04/2021   Procedure: BRONCHIAL NEEDLE ASPIRATION BIOPSIES;  Surgeon: Garner Nash, DO;  Location: [facility];  Service: Pulmonary;;   BRONCHIAL WASHINGS  06/04/2021   Procedure: BRONCHIAL WASHINGS;  Surgeon: Garner Nash, DO;  Location: [facility];  Service: Pulmonary;;   CARDIAC SURGERY     COLONOSCOPY     CORONARY ARTERY BYPASS GRAFT     2019 at [facility] Right 06/04/2021   Procedure: [place];  Surgeon: Garner Nash, DO;  Location: [facility];  Service: Pulmonary;  Laterality: Right;   VIDEO BRONCHOSCOPY WITH ENDOBRONCHIAL ULTRASOUND  06/04/2021   Procedure: VIDEO BRONCHOSCOPY WITH ENDOBRONCHIAL ULTRASOUND;  Surgeon: Garner Nash, DO;  Location: [facility];  Service: Pulmonary;;    REVIEW OF SYSTEMS:  A comprehensive review of systems was negative except for: Respiratory: positive for cough   PHYSICAL EXAMINATION: General appearance: alert, cooperative, and no distress Head: Normocephalic, without obvious abnormality, atraumatic Neck: no adenopathy, no JVD, supple, symmetrical, trachea midline, and thyroid not enlarged, symmetric, no tenderness/mass/nodules Lymph nodes: Cervical, supraclavicular, and axillary nodes normal. Resp: clear to auscultation bilaterally Back: symmetric, no curvature. ROM normal. No CVA tenderness. Cardio: regular rate and rhythm, S1, S2 normal, no murmur, click, rub or gallop GI: soft, non-tender; bowel sounds normal; no masses,  no organomegaly Extremities: extremities normal, atraumatic, no cyanosis or edema  ECOG PERFORMANCE STATUS: 1 - Symptomatic but completely ambulatory  Blood pressure (!) 159/76, pulse 81, temperature (!) 96.3 F (35.7 C), temperature source Tympanic, resp. rate 20, height _0  (1.727 m), weight 254 lb 12.8 oz (115.6 kg), SpO2 97 %.  LABORATORY DATA: Lab Results  Component Value Date   WBC 6.6  11/25/2021   HGB 12.4 (L) 11/25/2021   HCT 38.4 (L) 11/25/2021   MCV 97.2 11/25/2021   PLT 212 11/25/2021      Chemistry      Component Value Date/Time   NA 142 11/18/2021 1305   K 4.5 11/18/2021 1305   CL 112 (H) 11/18/2021 1305   CO2 22 11/18/2021 1305   BUN 20 11/18/2021 1305   CREATININE 1.26 (H) 11/18/2021 1305      Component Value Date/Time   CALCIUM 9.1 11/18/2021 1305   ALKPHOS 106 11/18/2021 1305   AST 20 11/18/2021 1305   ALT 12 11/18/2021 1305   BILITOT 0.3 11/18/2021 1305       RADIOGRAPHIC STUDIES: MR BRAIN W WO CONTRAST  Result Date: 11/15/2021 CLINICAL DATA:  Non-small cell lung cancer, staging. EXAM: MRI HEAD WITHOUT AND WITH CONTRAST TECHNIQUE: Multiplanar, multiecho pulse sequences of the brain and surrounding structures were obtained without and with intravenous contrast. CONTRAST:  39m GADAVIST GADOBUTROL 1 MMOL/ML IV SOLN COMPARISON:  None. FINDINGS: Multiple sequences are mildly to moderately motion degraded. Brain: There is no evidence of an acute infarct,  intracranial hemorrhage, mass, midline shift, or extra-axial fluid collection. No significant white matter disease is seen for age. There is mild-to-moderate cerebral atrophy. No abnormal enhancement is identified. Vascular: Major intracranial vascular flow voids are preserved. Skull and upper cervical spine: Unremarkable bone marrow signal. Sinuses/Orbits: Unremarkable orbits. Mild mucosal thickening in the paranasal sinuses. Trace left mastoid fluid. Other: None. IMPRESSION: 1. No evidence of intracranial metastases on this motion degraded study. 2.  Cerebral Atrophy (ICD10-G31.9). Electronically Signed   By: Logan Bores M.D.   On: 11/15/2021 08:54    ASSESSMENT AND PLAN: This is a very pleasant 75 years old African-American male diagnosed with a stage IIIB (T3b, N2, M0) non-small cell lung cancer, adenocarcinoma presented with large right lower lobe lung mass and suspicious right lower lobe paratracheal  lymphadenopathy diagnosed in June 2022. Has MRI of the brain is still pending. Molecular studies by [employer]ant 360 showed no actionable mutations. The patient underwent neoadjuvant chemotherapy according to the Checkmate 816 with carboplatin for AUC of 5, Alimta 500 Mg/M2 and nivolumab 360 Mg IV every 3 weeks.  Status post 3 cycles. The patient continues to tolerate his treatment well with no concerning adverse effects.  Repeat imaging studies after the neoadjuvant treatment showed stable disease and the patient was not a good candidate for surgical resection. He is currently undergoing a course of concurrent chemoradiation with weekly carboplatin for AUC of 2 and paclitaxel 45 Mg/M2 status post 1 cycle.  He will start the first fraction of radiotherapy on November 27, 2021. He had MRI of the brain performed recently and that showed no evidence of metastatic disease to the brain. I recommended for the patient to continue his treatment with concurrent chemoradiation as planned and he will proceed with cycle #2 today. I will see him back for follow-up visit in 2 weeks for evaluation before starting cycle #4. The patient was advised to call immediately if he has any other concerning symptoms in the interval. The patient voices understanding of current disease status and treatment options and is in agreement with the current care plan.  All questions were answered. The patient knows to call the clinic with any problems, questions or concerns. We can certainly see the patient much sooner if necessary.   Disclaimer: This note was dictated with voice recognition software. Similar sounding words can inadvertently be transcribed and may not be corrected upon review.

## 2021-11-25 NOTE — Progress Notes (Signed)
Oncology Nurse Navigator Documentation  Oncology Nurse Navigator Flowsheets 11/25/2021 06/17/2021  Abnormal Finding Date - 01/06/2021  Confirmed Diagnosis Date 06/04/2021 06/04/2021  Diagnosis Status Confirmed Diagnosis Complete Pending Molecular Studies  Planned Course of Treatment Chemo/Radiation Concurrent Chemotherapy;Targeted Therapy  Phase of Treatment Radiation Targeted Therapy  Chemotherapy Pending- Reason: Authorization -  Chemotherapy Actual Start Date: 11/18/2021 -  Radiation Pending-Reason: Authorization -  Radiation Actual Start Date: 11/27/2021 -  Targeted Therapy Pending Reason: Authorization -  Targeted Therapy Actual Start Date: 11/18/2021 -  Navigator Follow Up Date: 12/09/2021 06/20/2021  Navigator Follow Up Reason: Follow-up Appointment Appointment Review  Navigator Location [facility]  Referral Date to RadOnc/MedOnc - 06/17/2021  Navigator Encounter Type Clinic/MDC Initial MedOnc  Treatment Initiated Date 11/18/2021 -  Patient Visit Type MedOnc;Follow-up Initial;MedOnc  Treatment Phase Treatment Pre-Tx/Tx Discussion  Barriers/Navigation Needs Education Education  Education Other Newly Diagnosed Cancer Education;Other  Interventions Psycho-Social Support;Education Education;Psycho-Social Support  Acuity Level 2-Minimal Needs (1-2 Barriers Identified) Level 2-Minimal Needs (1-2 Barriers Identified)  Education Method Verbal Verbal;Written  Time Spent with Patient 53 66

## 2021-11-25 NOTE — Progress Notes (Signed)
Oncology Nurse Navigator Documentation  Oncology Nurse Navigator Flowsheets 11/25/2021 06/17/2021  Abnormal Finding Date - 01/06/2021  Confirmed Diagnosis Date 06/04/2021 06/04/2021  Diagnosis Status Confirmed Diagnosis Complete Pending Molecular Studies  Planned Course of Treatment Chemo/Radiation Concurrent Chemotherapy;Targeted Therapy  Phase of Treatment Radiation Targeted Therapy  Chemotherapy Pending- Reason: Authorization -  Chemotherapy Actual Start Date: 11/18/2021 -  Radiation Pending-Reason: Authorization -  Radiation Actual Start Date: 11/27/2021 -  Targeted Therapy Pending Reason: Authorization -  Targeted Therapy Actual Start Date: 11/18/2021 -  Navigator Follow Up Date: 12/09/2021 06/20/2021  Navigator Follow Up Reason: Follow-up Appointment Appointment Review  [facility]  Referral Date to RadOnc/MedOnc - 06/17/2021  Navigator Encounter Type Clinic/MDC Initial MedOnc  Treatment Initiated Date 11/18/2021 -  Patient Visit Type MedOnc;Follow-up Initial;MedOnc  Treatment Phase Treatment Pre-Tx/Tx Discussion  Barriers/Navigation Needs Education/I spoke with patient today during his visit to see Dr. Julien Nordmann.  I help to explain patient plan of care. He verbalized understanding.   Education  Education Other Newly Diagnosed Cancer Education;Other  Interventions Psycho-Social Support;Education Education;Psycho-Social Support  Acuity Level 2-Minimal Needs (1-2 Barriers Identified) Level 2-Minimal Needs (1-2 Barriers Identified)  Education Method Verbal Verbal;Written  Time Spent with Patient 46 19

## 2021-11-27 ENCOUNTER — Other Ambulatory Visit: Payer: Self-pay

## 2021-11-27 ENCOUNTER — Ambulatory Visit
Admission: RE | Admit: 2021-11-27 | Discharge: 2021-11-27 | Disposition: A | Discharge status: Home / Self Care | Payer: No Typology Code available for payment source | Source: Ambulatory Visit | Attending: Radiation Oncology | Admitting: Radiation Oncology

## 2021-11-27 DIAGNOSIS — Z510 Encounter for antineoplastic radiation therapy: Secondary | ICD-10-CM | POA: Diagnosis not present

## 2021-11-28 ENCOUNTER — Ambulatory Visit
Admission: RE | Admit: 2021-11-28 | Discharge: 2021-11-28 | Disposition: A | Discharge status: Home / Self Care | Payer: No Typology Code available for payment source | Source: Ambulatory Visit | Attending: Radiation Oncology | Admitting: Radiation Oncology

## 2021-11-28 DIAGNOSIS — C3431 Malignant neoplasm of lower lobe, right bronchus or lung: Secondary | ICD-10-CM | POA: Diagnosis present

## 2021-11-28 DIAGNOSIS — Z510 Encounter for antineoplastic radiation therapy: Secondary | ICD-10-CM | POA: Diagnosis not present

## 2021-11-29 ENCOUNTER — Other Ambulatory Visit: Payer: Self-pay

## 2021-11-29 ENCOUNTER — Ambulatory Visit
Admission: RE | Admit: 2021-11-29 | Discharge: 2021-11-29 | Disposition: A | Discharge status: Home / Self Care | Payer: No Typology Code available for payment source | Source: Ambulatory Visit | Attending: Radiation Oncology | Admitting: Radiation Oncology

## 2021-11-29 DIAGNOSIS — Z510 Encounter for antineoplastic radiation therapy: Secondary | ICD-10-CM | POA: Diagnosis not present

## 2021-11-29 MED FILL — Dexamethasone Sodium Phosphate Inj 100 MG/10ML: INTRAMUSCULAR | Qty: 1 | Status: AC

## 2021-12-02 ENCOUNTER — Inpatient Hospital Stay: Payer: No Typology Code available for payment source

## 2021-12-02 ENCOUNTER — Ambulatory Visit
Admission: RE | Admit: 2021-12-02 | Discharge: 2021-12-02 | Disposition: A | Discharge status: Home / Self Care | Payer: No Typology Code available for payment source | Source: Ambulatory Visit | Attending: Radiation Oncology | Admitting: Radiation Oncology

## 2021-12-02 ENCOUNTER — Inpatient Hospital Stay: Payer: No Typology Code available for payment source | Attending: Internal Medicine

## 2021-12-02 ENCOUNTER — Other Ambulatory Visit: Payer: Self-pay

## 2021-12-02 VITALS — BP 156/82 | HR 68 | Temp 97.8°F | Resp 20 | Ht 68.0 in | Wt 253.8 lb

## 2021-12-02 DIAGNOSIS — Z510 Encounter for antineoplastic radiation therapy: Secondary | ICD-10-CM | POA: Diagnosis not present

## 2021-12-02 DIAGNOSIS — C3411 Malignant neoplasm of upper lobe, right bronchus or lung: Secondary | ICD-10-CM | POA: Insufficient documentation

## 2021-12-02 DIAGNOSIS — F17210 Nicotine dependence, cigarettes, uncomplicated: Secondary | ICD-10-CM | POA: Diagnosis not present

## 2021-12-02 DIAGNOSIS — I2510 Atherosclerotic heart disease of native coronary artery without angina pectoris: Secondary | ICD-10-CM | POA: Insufficient documentation

## 2021-12-02 DIAGNOSIS — E119 Type 2 diabetes mellitus without complications: Secondary | ICD-10-CM | POA: Insufficient documentation

## 2021-12-02 DIAGNOSIS — Z5111 Encounter for antineoplastic chemotherapy: Secondary | ICD-10-CM | POA: Insufficient documentation

## 2021-12-02 DIAGNOSIS — C3491 Malignant neoplasm of unspecified part of right bronchus or lung: Secondary | ICD-10-CM

## 2021-12-02 DIAGNOSIS — I110 Hypertensive heart disease with heart failure: Secondary | ICD-10-CM | POA: Diagnosis not present

## 2021-12-02 LAB — CMP (CANCER CENTER ONLY)
ALT: 9 U/L (ref 0–44)
AST: 18 U/L (ref 15–41)
Albumin: 3.3 g/dL — ABNORMAL LOW (ref 3.5–5.0)
Alkaline Phosphatase: 113 U/L (ref 38–126)
Anion gap: 9 (ref 5–15)
BUN: 16 mg/dL (ref 8–23)
CO2: 22 mmol/L (ref 22–32)
Calcium: 8.8 mg/dL — ABNORMAL LOW (ref 8.9–10.3)
Chloride: 107 mmol/L (ref 98–111)
Creatinine: 1.31 mg/dL — ABNORMAL HIGH (ref 0.61–1.24)
GFR, Estimated: 56 mL/min — ABNORMAL LOW (ref >60)
Glucose, Bld: 225 mg/dL — ABNORMAL HIGH (ref 70–99)
Potassium: 4.8 mmol/L (ref 3.5–5.1)
Sodium: 138 mmol/L (ref 135–145)
Total Bilirubin: 0.4 mg/dL (ref 0.3–1.2)
Total Protein: 6.4 g/dL — ABNORMAL LOW (ref 6.5–8.1)

## 2021-12-02 LAB — CBC WITH DIFFERENTIAL (CANCER CENTER ONLY)
Abs Immature Granulocytes: 0.03 10*3/uL (ref 0.00–0.07)
Basophils Absolute: 0 10*3/uL (ref 0.0–0.1)
Basophils Relative: 1 %
Eosinophils Absolute: 0.1 10*3/uL (ref 0.0–0.5)
Eosinophils Relative: 2 %
HCT: 35.9 % — ABNORMAL LOW (ref 39.0–52.0)
Hemoglobin: 11.9 g/dL — ABNORMAL LOW (ref 13.0–17.0)
Immature Granulocytes: 1 %
Lymphocytes Relative: 26 %
Lymphs Abs: 1 10*3/uL (ref 0.7–4.0)
MCH: 32.1 pg (ref 26.0–34.0)
MCHC: 33.1 g/dL (ref 30.0–36.0)
MCV: 96.8 fL (ref 80.0–100.0)
Monocytes Absolute: 0.2 10*3/uL (ref 0.1–1.0)
Monocytes Relative: 6 %
Neutro Abs: 2.4 10*3/uL (ref 1.7–7.7)
Neutrophils Relative %: 64 %
Platelet Count: 224 10*3/uL (ref 150–400)
RBC: 3.71 MIL/uL — ABNORMAL LOW (ref 4.22–5.81)
RDW: 16 % — ABNORMAL HIGH (ref 11.5–15.5)
WBC Count: 3.8 10*3/uL — ABNORMAL LOW (ref 4.0–10.5)
nRBC: 0 % (ref 0.0–0.2)

## 2021-12-02 MED ORDER — SODIUM CHLORIDE 0.9 % IV SOLN
10.0000 mg | Freq: Once | INTRAVENOUS | Status: AC
  Administered 2021-12-02: 10 mg via INTRAVENOUS
  Filled 2021-12-02: qty 10

## 2021-12-02 MED ORDER — DIPHENHYDRAMINE HCL 50 MG/ML IJ SOLN
50.0000 mg | Freq: Once | INTRAMUSCULAR | Status: AC
  Administered 2021-12-02: 50 mg via INTRAVENOUS
  Filled 2021-12-02: qty 1

## 2021-12-02 MED ORDER — SODIUM CHLORIDE 0.9 % IV SOLN: Freq: Once | INTRAVENOUS | Status: AC

## 2021-12-02 MED ORDER — FAMOTIDINE 20 MG IN NS 100 ML IVPB
20.0000 mg | Freq: Once | INTRAVENOUS | Status: AC
  Administered 2021-12-02: 20 mg via INTRAVENOUS
  Filled 2021-12-02: qty 100

## 2021-12-02 MED ORDER — PALONOSETRON HCL INJECTION 0.25 MG/5ML
0.2500 mg | Freq: Once | INTRAVENOUS | Status: AC
  Administered 2021-12-02: 0.25 mg via INTRAVENOUS
  Filled 2021-12-02: qty 5

## 2021-12-02 MED ORDER — SODIUM CHLORIDE 0.9 % IV SOLN
209.2000 mg | Freq: Once | INTRAVENOUS | Status: AC
  Administered 2021-12-02: 210 mg via INTRAVENOUS
  Filled 2021-12-02: qty 21

## 2021-12-02 MED ORDER — SODIUM CHLORIDE 0.9 % IV SOLN
45.0000 mg/m2 | Freq: Once | INTRAVENOUS | Status: AC
  Administered 2021-12-02: 102 mg via INTRAVENOUS
  Filled 2021-12-02: qty 17

## 2021-12-02 NOTE — Patient Instructions (Signed)
[facility]  Discharge Instructions: Thank you for choosing [facility] to provide your oncology and hematology care.   If you have a lab appointment with the Cancer Center, please go directly to the Cancer Center and check in at the registration area.   Wear comfortable clothing and clothing appropriate for easy access to any Portacath or PICC line.   We strive to give you quality time with your provider. You may need to reschedule your appointment if you arrive late (15 or more minutes).  Arriving late affects you and other patients whose appointments are after yours.  Also, if you miss three or more appointments without notifying the office, you may be dismissed from the clinic at the provider's discretion.      For prescription refill requests, have your pharmacy contact our office and allow 72 hours for refills to be completed.    Today you received the following chemotherapy and/or immunotherapy agents: Paclitaxel (Taxol) and Carboplatin.   To help prevent nausea and vomiting after your treatment, we encourage you to take your nausea medication as directed.  BELOW ARE SYMPTOMS THAT SHOULD BE REPORTED IMMEDIATELY: *FEVER GREATER THAN 100.4 F (38 C) OR HIGHER *CHILLS OR SWEATING *NAUSEA AND VOMITING THAT IS NOT CONTROLLED WITH YOUR NAUSEA MEDICATION *UNUSUAL SHORTNESS OF BREATH *UNUSUAL BRUISING OR BLEEDING *URINARY PROBLEMS (pain or burning when urinating, or frequent urination) *BOWEL PROBLEMS (unusual diarrhea, constipation, pain near the anus) TENDERNESS IN MOUTH AND THROAT WITH OR WITHOUT PRESENCE OF ULCERS (sore throat, sores in mouth, or a toothache) UNUSUAL RASH, SWELLING OR PAIN  UNUSUAL VAGINAL DISCHARGE OR ITCHING   Items with * indicate a potential emergency and should be followed up as soon as possible or go to the Emergency Department if any problems should occur.  Please show the CHEMOTHERAPY ALERT CARD or IMMUNOTHERAPY  ALERT CARD at check-in to the Emergency Department and triage nurse.  Should you have questions after your visit or need to cancel or reschedule your appointment, please contact [facility]  Dept: [phone]  and follow the prompts.  Office hours are 8:00 a.m. to 4:30 p.m. Monday - Friday. Please note that voicemails left after 4:00 p.m. may not be returned until the following business day.  We are closed weekends and major holidays. You have access to a nurse at all times for urgent questions. Please call the main number to the clinic Dept: [phone] and follow the prompts.   For any non-urgent questions, you may also contact your provider using MyChart. We now offer e-Visits for anyone 18 and older to request care online for non-urgent symptoms. For details visit [email or web address].   Also download the MyChart app! Go to the app store, search "MyChart", open the app, select [facility], and log in with your MyChart username and password.  Due to Covid, a mask is required upon entering the hospital/clinic. If you do not have a mask, one will be given to you upon arrival. For doctor visits, patients may have 1 support person aged 18 or older with them. For treatment visits, patients cannot have anyone with them due to current Covid guidelines and our immunocompromised population.   

## 2021-12-03 ENCOUNTER — Ambulatory Visit
Admission: RE | Admit: 2021-12-03 | Discharge: 2021-12-03 | Disposition: A | Discharge status: Home / Self Care | Payer: No Typology Code available for payment source | Source: Ambulatory Visit | Attending: Radiation Oncology | Admitting: Radiation Oncology

## 2021-12-03 DIAGNOSIS — Z510 Encounter for antineoplastic radiation therapy: Secondary | ICD-10-CM | POA: Diagnosis not present

## 2021-12-03 NOTE — Progress Notes (Signed)
[name] [id number]  DIAGNOSIS: Stage IIIB (T3b, N2, M0) non-small cell lung cancer, adenocarcinoma presented with large right lower lobe lung mass and suspicious right lower paratracheal lymphadenopathy diagnosed in June 2022.   Molecular studies by [employer] 360 showed no actionable mutations  PRIOR THERAPY: Neoadjuvant systemic chemotherapy according to the Checkmate 816 with carboplatin for AUC of 5, Alimta 500 Mg/M2 and nivolumab 360 Mg IV every 3 weeks for 3 cycles.  Last dose 09/24/21.  Status post 3 cycles.   CURRENT THERAPY: Concurrent chemoradiation with carboplatin for an AUC of 2 and paclitaxel 45 mg per metered square.  First dose on 11/18/2021.   INTERVAL HISTORY: Paul [name] 75 y.o. male returns to the clinic today for a follow-up visit.  Overall, the patient is feeling well today without any concerning complaints. He is tolerating his treatment with concurrent chemoradiation well without any concerning adverse side effects. His last day of radiation is scheduled for 01/09/21.   He denies any recent fever, chills, or night sweats. He estimates that he lost about 2 lbs since his last appointment. Denies any radiation induced esophagitis at this time. He denies any chest pain or hemoptysis. He states his breathing is "pretty good". He sometimes has dyspnea on exertion depending on the activity. He has a mild cough reportedly for 1 month after receiving the flu shot. He reports clear phlegm. He takes Alka-Seltzer reportedly for his cough. He did try taking robitussin once which did help.  He continues to smoke about 1/2 a pack of cigarettes daily which is an improvement for him compared to his baseline 2 packs of cigarettes per day. He denies any nausea, vomiting, diarrhea, or constipation.  He denies any headache or visual changes.  He is here today for evaluation and repeat blood  work before starting cycle #4.      MEDICAL HISTORY: Past Medical History:  Diagnosis Date   CHF (congestive heart failure) ([place])    Coronary artery disease    Diabetes mellitus without complication ([place])    Hypertension    Hypothyroidism    Ischemic cardiomyopathy    Pneumonia    a long time ago   Sleep apnea    no longer uses a cpap    ALLERGIES:  has No Known Allergies.  MEDICATIONS:  Current Outpatient Medications  Medication Sig Dispense Refill   aspirin 81 MG chewable tablet Take 81 mg by mouth daily.     blood glucose meter kit and supplies KIT Dispense based on patient and insurance preference. Use up to four times daily as directed. 1 each 0   carvedilol (COREG) 25 MG tablet Take 25 mg by mouth at bedtime.     folic acid (FOLVITE) 1 MG tablet Take 1 tablet (1 mg total) by mouth daily. 30 tablet 4   furosemide (LASIX) 20 MG tablet Take 20 mg by mouth at bedtime.     insulin glargine (LANTUS) 100 UNIT/ML Solostar Pen Inject 10 Units into the skin daily. 5 mL 5   levothyroxine (SYNTHROID) 25 MCG tablet Take 25 mcg by mouth at bedtime.     Menthol-Methyl Salicylate (MUSCLE RUB) 10-15 % CREA Apply 1 application topically daily as needed for muscle pain.     metFORMIN (GLUCOPHAGE) 500 MG tablet Take 1 tablet (500 mg total) by mouth 2 (two) times daily with a meal. 60 tablet 3   Multiple Vitamins-Minerals (MULTIVITAMIN WITH MINERALS) tablet  Take 1 tablet by mouth daily.     nitroGLYCERIN (NITROSTAT) 0.4 MG SL tablet Place 0.4 mg under the tongue every 5 (five) minutes as needed for chest pain.     omeprazole (PRILOSEC) 20 MG capsule Take 20 mg by mouth daily. (Patient not taking: Reported on 11/20/2021)     prochlorperazine (COMPAZINE) 10 MG tablet Take 1 tablet (10 mg total) by mouth every 6 (six) hours as needed for nausea or vomiting. 30 tablet 0   sacubitril-valsartan (ENTRESTO) 49-51 MG Take 0.5 tablets by mouth 2 (two) times daily.     No current facility-administered  medications for this visit.   Facility-Administered Medications Ordered in Other Visits  Medication Dose Route Frequency Provider Last Rate Last Admin   0.9 %  sodium chloride infusion   Intravenous Once Curt Bears, MD       CARBOplatin (PARAPLATIN) 210 mg in sodium chloride 0.9 % 100 mL chemo infusion  210 mg Intravenous Once Curt Bears, MD       dexamethasone (DECADRON) 10 mg in sodium chloride 0.9 % 50 mL IVPB  10 mg Intravenous Once Curt Bears, MD       diphenhydrAMINE (BENADRYL) injection 50 mg  50 mg Intravenous Once Curt Bears, MD       famotidine (PEPCID) IVPB 20 mg in NS 100 mL IVPB  20 mg Intravenous Once Curt Bears, MD       PACLitaxel (TAXOL) 102 mg in sodium chloride 0.9 % 250 mL chemo infusion (</= 55m/m2)  45 mg/m2 (Treatment Plan Recorded) Intravenous Once MCurt Bears MD       palonosetron (ALOXI) injection 0.25 mg  0.25 mg Intravenous Once MCurt Bears MD        SURGICAL HISTORY:  Past Surgical History:  Procedure Laterality Date   BRONCHIAL BIOPSY  06/04/2021   Procedure: BRONCHIAL BIOPSIES;  Surgeon: IGarner Nash DO;  [facility]  Service: Pulmonary;;   BRONCHIAL BRUSHINGS  06/04/2021   Procedure: BRONCHIAL BRUSHINGS;  Surgeon: IGarner Nash DO;  [facility];  Service: Pulmonary;;   BRONCHIAL NEEDLE ASPIRATION BIOPSY  06/04/2021   Procedure: BRONCHIAL NEEDLE ASPIRATION BIOPSIES;  Surgeon: IGarner Nash DO;  [facility];  Service: Pulmonary;;   BRONCHIAL WASHINGS  06/04/2021   Procedure: BRONCHIAL WASHINGS;  Surgeon: IGarner Nash DO;  [facility]  Service: Pulmonary;;   CARDIAC SURGERY     COLONOSCOPY     CORONARY ARTERY BY[place] GRA[place]     [place] [facility]   [place]cedure[place]  Surgeon: IGarner Nash DO;  [facility]  Service: Pulmonary;   Laterality: Right;   VIDEO BRONCHOSCOPY WITH ENDOBRONCHIAL ULTRASOUND  06/04/2021   Procedure: VIDEO BRONCHOSCOPY WITH ENDOBRONCHIAL ULTRASOUND;  Surgeon: IGarner Nash DO;  [facility];  Service: Pulmonary;;    REVIEW OF SYSTEMS:   Review of Systems  Constitutional: Positive for 2 pound weight loss and fatigue.  Negative for appetite change, chills and fever HENT: Negative for mouth sores, nosebleeds, sore throat and trouble swallowing.   Eyes: Negative for eye problems and icterus.  Respiratory: Positive for minor cough and occasional dyspnea with certain strenuous activities.  Negative for hemoptysis and wheezing.   Cardiovascular: Negative for chest pain and leg swelling.  Gastrointestinal: Negative for abdominal pain, constipation, diarrhea, nausea and vomiting.  Genitourinary: Negative for bladder incontinence, difficulty urinating, dysuria, frequency and hematuria.   Musculoskeletal: Negative  for back pain, gait problem, neck pain and neck stiffness.  Skin: Negative for itching and rash.  Neurological: Negative for dizziness, extremity weakness, gait problem, headaches, light-headedness and seizures.  Hematological: Negative for adenopathy. Does not bruise/bleed easily.  Psychiatric/Behavioral: Negative for confusion, depression and sleep disturbance. The patient is not nervous/anxious.     PHYSICAL EXAMINATION:  There were no vitals taken for this visit.  ECOG PERFORMANCE STATUS: 1  Physical Exam  Constitutional: Oriented to person, place, and time and well-developed, well-nourished, and in no distress.  HENT:  Head: Normocephalic and atraumatic.  Mouth/Throat: Oropharynx is clear and moist. No oropharyngeal exudate.  Eyes: Conjunctivae are normal. Right eye exhibits no discharge. Left eye exhibits no discharge. No scleral icterus.  Neck: Normal range of motion. Neck supple.  Cardiovascular: Normal rate, regular rhythm, normal heart sounds and intact distal  pulses.   Pulmonary/Chest: Effort normal and breath sounds normal. No respiratory distress. No wheezes. No rales.  Abdominal: Soft. Bowel sounds are normal. Exhibits no distension and no mass. There is no tenderness.  Musculoskeletal: Normal range of motion. Exhibits no edema.  Lymphadenopathy:    No cervical adenopathy.  Neurological: Alert and oriented to person, place, and time. Exhibits normal muscle tone. Gait normal. Coordination normal.  Skin: Skin is warm and dry. No rash noted. Not diaphoretic. No erythema. No pallor.  Psychiatric: Mood, memory and judgment normal.  Vitals reviewed.  LABORATORY DATA: Lab Results  Component Value Date   WBC 2.9 (L) 12/09/2021   HGB 12.1 (L) 12/09/2021   HCT 36.8 (L) 12/09/2021   MCV 96.8 12/09/2021   PLT 207 12/09/2021      Chemistry      Component Value Date/Time   NA 141 12/09/2021 1442   K 4.8 12/09/2021 1442   CL 110 12/09/2021 1442   CO2 22 12/09/2021 1442   BUN 20 12/09/2021 1442   CREATININE 1.43 (H) 12/09/2021 1442      Component Value Date/Time   CALCIUM 9.0 12/09/2021 1442   ALKPHOS 113 12/09/2021 1442   AST 18 12/09/2021 1442   ALT 11 12/09/2021 1442   BILITOT 0.4 12/09/2021 1442       RADIOGRAPHIC STUDIES:  MR BRAIN W WO CONTRAST  Result Date: 11/15/2021 CLINICAL DATA:  Non-small cell lung cancer, staging. EXAM: MRI HEAD WITHOUT AND WITH CONTRAST TECHNIQUE: Multiplanar, multiecho pulse sequences of the brain and surrounding structures were obtained without and with intravenous contrast. CONTRAST:  72m GADAVIST GADOBUTROL 1 MMOL/ML IV SOLN COMPARISON:  None. FINDINGS: Multiple sequences are mildly to moderately motion degraded. Brain: There is no evidence of an acute infarct, intracranial hemorrhage, mass, midline shift, or extra-axial fluid collection. No significant white matter disease is seen for age. There is mild-to-moderate cerebral atrophy. No abnormal enhancement is identified. Vascular: Major intracranial  vascular flow voids are preserved. Skull and upper cervical spine: Unremarkable bone marrow signal. Sinuses/Orbits: Unremarkable orbits. Mild mucosal thickening in the paranasal sinuses. Trace left mastoid fluid. Other: None. IMPRESSION: 1. No evidence of intracranial metastases on this motion degraded study. 2.  Cerebral Atrophy (ICD10-G31.9). Electronically Signed   By: ALogan BoresM.D.   On: 11/15/2021 08:54     ASSESSMENT/PLAN:  This is a very pleasant 75year old African-American male diagnosed with stage IIIb (T3b, N2, M0) non-small cell lung cancer, adenocarcinoma.  He presented with a large right upper lobe lung mass and suspicious right lower lobe paratracheal lymphadenopathy.  He was diagnosed in June 2022.  His molecular studies by guardant 360  were negative for any actionable mutations.  The patient completed 3 cycles of neoadjuvant chemotherapy according to the Checkmate 816 trial with carboplatin for an AUC of 5, Alimta 500 mg per metered square, and nivolumab 360 mg IV every 3 weeks.  The patient status post 3 cycles.  His last dose was on 09/24/2021.  Upon reimaging after completion of this course, there was not significant improvement in the size of his mass and therefore, it does not appear that we are able to downstage his disease to make him eligible for surgical resection.  Therefore, Dr. Julien Nordmann recommended that the patient start concurrent chemoradiation with carboplatin for an AUC of 2 and paclitaxel 45 mg per metered squared.  The patient status post 3 cycles and he has been tolerating it well.   Labs were reviewed.  Recommend that he proceed with cycle number 4 today as schedule.  We will see him back for follow-up visit in 2 weeks for evaluation before starting cycle #6.  Advised he may use delsym or robitussin if needed for his baseline cough.   Strongly encouraged him to quit smoking as it will help his appetite and baseline cough.   The patient was advised to call  immediately if he has any concerning symptoms in the interval. The patient voices understanding of current disease status and treatment options and is in agreement with the current care plan. All questions were answered. The patient knows to call the clinic with any problems, questions or concerns. We can certainly see the patient much sooner if necessary     No orders of the defined types were placed in this encounter.   The total time spent in the appointment was 20-29 minutes  Rigby Swamy L Damen Windsor, PA-C 12/09/21

## 2021-12-04 ENCOUNTER — Other Ambulatory Visit: Payer: Self-pay

## 2021-12-04 ENCOUNTER — Ambulatory Visit
Admission: RE | Admit: 2021-12-04 | Discharge: 2021-12-04 | Disposition: A | Discharge status: Home / Self Care | Payer: No Typology Code available for payment source | Source: Ambulatory Visit | Attending: Radiation Oncology | Admitting: Radiation Oncology

## 2021-12-04 DIAGNOSIS — Z510 Encounter for antineoplastic radiation therapy: Secondary | ICD-10-CM | POA: Diagnosis not present

## 2021-12-05 ENCOUNTER — Ambulatory Visit
Admission: RE | Admit: 2021-12-05 | Discharge: 2021-12-05 | Disposition: A | Discharge status: Home / Self Care | Payer: No Typology Code available for payment source | Source: Ambulatory Visit | Attending: Radiation Oncology | Admitting: Radiation Oncology

## 2021-12-05 DIAGNOSIS — Z510 Encounter for antineoplastic radiation therapy: Secondary | ICD-10-CM | POA: Diagnosis not present

## 2021-12-06 ENCOUNTER — Other Ambulatory Visit: Payer: Self-pay

## 2021-12-06 ENCOUNTER — Ambulatory Visit
Admission: RE | Admit: 2021-12-06 | Discharge: 2021-12-06 | Disposition: A | Discharge status: Home / Self Care | Payer: No Typology Code available for payment source | Source: Ambulatory Visit | Attending: Radiation Oncology | Admitting: Radiation Oncology

## 2021-12-06 DIAGNOSIS — Z510 Encounter for antineoplastic radiation therapy: Secondary | ICD-10-CM | POA: Diagnosis not present

## 2021-12-06 MED FILL — Dexamethasone Sodium Phosphate Inj 100 MG/10ML: INTRAMUSCULAR | Qty: 1 | Status: AC

## 2021-12-09 ENCOUNTER — Ambulatory Visit
Admission: RE | Admit: 2021-12-09 | Discharge: 2021-12-09 | Disposition: A | Discharge status: Home / Self Care | Payer: No Typology Code available for payment source | Source: Ambulatory Visit | Attending: Radiation Oncology | Admitting: Radiation Oncology

## 2021-12-09 ENCOUNTER — Other Ambulatory Visit: Payer: Self-pay

## 2021-12-09 ENCOUNTER — Inpatient Hospital Stay: Payer: No Typology Code available for payment source

## 2021-12-09 ENCOUNTER — Inpatient Hospital Stay (HOSPITAL_BASED_OUTPATIENT_CLINIC_OR_DEPARTMENT_OTHER): Payer: No Typology Code available for payment source | Admitting: Physician Assistant

## 2021-12-09 VITALS — BP 101/70 | HR 70 | Temp 98.1°F | Resp 18 | Wt 248.5 lb

## 2021-12-09 DIAGNOSIS — Z5111 Encounter for antineoplastic chemotherapy: Secondary | ICD-10-CM | POA: Diagnosis not present

## 2021-12-09 DIAGNOSIS — C3411 Malignant neoplasm of upper lobe, right bronchus or lung: Secondary | ICD-10-CM

## 2021-12-09 DIAGNOSIS — E119 Type 2 diabetes mellitus without complications: Secondary | ICD-10-CM

## 2021-12-09 DIAGNOSIS — I10 Essential (primary) hypertension: Secondary | ICD-10-CM | POA: Diagnosis not present

## 2021-12-09 DIAGNOSIS — C3491 Malignant neoplasm of unspecified part of right bronchus or lung: Secondary | ICD-10-CM

## 2021-12-09 DIAGNOSIS — Z510 Encounter for antineoplastic radiation therapy: Secondary | ICD-10-CM | POA: Diagnosis not present

## 2021-12-09 LAB — CBC WITH DIFFERENTIAL (CANCER CENTER ONLY)
Abs Immature Granulocytes: 0.03 10*3/uL (ref 0.00–0.07)
Basophils Absolute: 0 10*3/uL (ref 0.0–0.1)
Basophils Relative: 1 %
Eosinophils Absolute: 0 10*3/uL (ref 0.0–0.5)
Eosinophils Relative: 1 %
HCT: 36.8 % — ABNORMAL LOW (ref 39.0–52.0)
Hemoglobin: 12.1 g/dL — ABNORMAL LOW (ref 13.0–17.0)
Immature Granulocytes: 1 %
Lymphocytes Relative: 22 %
Lymphs Abs: 0.7 10*3/uL (ref 0.7–4.0)
MCH: 31.8 pg (ref 26.0–34.0)
MCHC: 32.9 g/dL (ref 30.0–36.0)
MCV: 96.8 fL (ref 80.0–100.0)
Monocytes Absolute: 0.3 10*3/uL (ref 0.1–1.0)
Monocytes Relative: 9 %
Neutro Abs: 1.9 10*3/uL (ref 1.7–7.7)
Neutrophils Relative %: 66 %
Platelet Count: 207 10*3/uL (ref 150–400)
RBC: 3.8 MIL/uL — ABNORMAL LOW (ref 4.22–5.81)
RDW: 16.3 % — ABNORMAL HIGH (ref 11.5–15.5)
WBC Count: 2.9 10*3/uL — ABNORMAL LOW (ref 4.0–10.5)
nRBC: 1 % — ABNORMAL HIGH (ref 0.0–0.2)

## 2021-12-09 LAB — CMP (CANCER CENTER ONLY)
ALT: 11 U/L (ref 0–44)
AST: 18 U/L (ref 15–41)
Albumin: 3.4 g/dL — ABNORMAL LOW (ref 3.5–5.0)
Alkaline Phosphatase: 113 U/L (ref 38–126)
Anion gap: 9 (ref 5–15)
BUN: 20 mg/dL (ref 8–23)
CO2: 22 mmol/L (ref 22–32)
Calcium: 9 mg/dL (ref 8.9–10.3)
Chloride: 110 mmol/L (ref 98–111)
Creatinine: 1.43 mg/dL — ABNORMAL HIGH (ref 0.61–1.24)
GFR, Estimated: 50 mL/min — ABNORMAL LOW (ref >60)
Glucose, Bld: 176 mg/dL — ABNORMAL HIGH (ref 70–99)
Potassium: 4.8 mmol/L (ref 3.5–5.1)
Sodium: 141 mmol/L (ref 135–145)
Total Bilirubin: 0.4 mg/dL (ref 0.3–1.2)
Total Protein: 6.5 g/dL (ref 6.5–8.1)

## 2021-12-09 MED ORDER — PALONOSETRON HCL INJECTION 0.25 MG/5ML
0.2500 mg | Freq: Once | INTRAVENOUS | Status: AC
  Administered 2021-12-09: 0.25 mg via INTRAVENOUS
  Filled 2021-12-09: qty 5

## 2021-12-09 MED ORDER — FAMOTIDINE 20 MG IN NS 100 ML IVPB
20.0000 mg | Freq: Once | INTRAVENOUS | Status: AC
  Administered 2021-12-09: 20 mg via INTRAVENOUS
  Filled 2021-12-09: qty 100

## 2021-12-09 MED ORDER — SODIUM CHLORIDE 0.9 % IV SOLN
10.0000 mg | Freq: Once | INTRAVENOUS | Status: AC
  Administered 2021-12-09: 10 mg via INTRAVENOUS
  Filled 2021-12-09: qty 10

## 2021-12-09 MED ORDER — SODIUM CHLORIDE 0.9 % IV SOLN: Freq: Once | INTRAVENOUS | Status: AC

## 2021-12-09 MED ORDER — SODIUM CHLORIDE 0.9 % IV SOLN
200.0000 mg | Freq: Once | INTRAVENOUS | Status: AC
  Administered 2021-12-09: 200 mg via INTRAVENOUS
  Filled 2021-12-09: qty 20

## 2021-12-09 MED ORDER — SODIUM CHLORIDE 0.9 % IV SOLN
45.0000 mg/m2 | Freq: Once | INTRAVENOUS | Status: AC
  Administered 2021-12-09: 102 mg via INTRAVENOUS
  Filled 2021-12-09: qty 17

## 2021-12-09 MED ORDER — DIPHENHYDRAMINE HCL 50 MG/ML IJ SOLN
50.0000 mg | Freq: Once | INTRAMUSCULAR | Status: AC
  Administered 2021-12-09: 50 mg via INTRAVENOUS
  Filled 2021-12-09: qty 1

## 2021-12-09 NOTE — Progress Notes (Signed)
OK to start premedications without waiting on CMET per [name] Heilingoepter, PA

## 2021-12-09 NOTE — Patient Instructions (Signed)
[facility] MEDICAL [facility]COLOG[facility]  Discharge Instructions: Thank you for choosing [facility] to provide your oncology and hematology care.   If you have a lab appointment with the Cancer Center, please go directly to the Cancer Center and check in at the registration area.   Wear comfortable clothing and clothing appropriate for easy access to any Portacath or PICC line.   We strive to give you quality time with your provider. You may need to reschedule your appointment if you arrive late (15 or more minutes).  Arriving late affects you and other patients whose appointments are after yours.  Also, if you miss three or more appointments without notifying the office, you may be dismissed from the clinic at the provider's discretion.      For prescription refill requests, have your pharmacy contact our office and allow 72 hours for refills to be completed.    Today you received the following chemotherapy and/or immunotherapy agents taxol and carboplatin      To help prevent nausea and vomiting after your treatment, we encourage you to take your nausea medication as directed.  BELOW ARE SYMPTOMS THAT SHOULD BE REPORTED IMMEDIATELY: *FEVER GREATER THAN 100.4 F (38 C) OR HIGHER *CHILLS OR SWEATING *NAUSEA AND VOMITING THAT IS NOT CONTROLLED WITH YOUR NAUSEA MEDICATION *UNUSUAL SHORTNESS OF BREATH *UNUSUAL BRUISING OR BLEEDING *URINARY PROBLEMS (pain or burning when urinating, or frequent urination) *BOWEL PROBLEMS (unusual diarrhea, constipation, pain near the anus) TENDERNESS IN MOUTH AND THROAT WITH OR WITHOUT PRESENCE OF ULCERS (sore throat, sores in mouth, or a toothache) UNUSUAL RASH, SWELLING OR PAIN  UNUSUAL VAGINAL DISCHARGE OR ITCHING   Items with * indicate a potential emergency and should be followed up as soon as possible or go to the Emergency Department if any problems should occur.  Please show the CHEMOTHERAPY ALERT CARD or IMMUNOTHERAPY ALERT CARD at  check-in to the Emergency Department and triage nurse.  Should you have questions after your visit or need to cancel or reschedule your appointment, please contact [facility]  Dept: [phone]  and follow the prompts.  Office hours are 8:00 a.m. to 4:30 p.m. Monday - Friday. Please note that voicemails left after 4:00 p.m. may not be returned until the following business day.  We are closed weekends and major holidays. You have access to a nurse at all times for urgent questions. Please call the main number to the clinic Dept: [phone] and follow the prompts.   For any non-urgent questions, you may also contact your provider using MyChart. We now offer e-Visits for anyone 18 and older to request care online for non-urgent symptoms. For details visit [email or web address].   Also download the MyChart app! Go to the app store, search "MyChart", open the app, select [name], and log in with your MyChart username and password.  Due to Covid, a mask is required upon entering the hospital/clinic. If you do not have a mask, one will be given to you upon arrival. For doctor visits, patients may have 1 support person aged 18 or older with them. For treatment visits, patients cannot have anyone with them due to current Covid guidelines and our immunocompromised population.   

## 2021-12-09 NOTE — Progress Notes (Signed)
Carbo dose = 200mg  today per Dr Julien Nordmann

## 2021-12-10 ENCOUNTER — Other Ambulatory Visit: Payer: Self-pay | Admitting: Physician Assistant

## 2021-12-10 ENCOUNTER — Ambulatory Visit
Admission: RE | Admit: 2021-12-10 | Discharge: 2021-12-10 | Disposition: A | Discharge status: Home / Self Care | Payer: No Typology Code available for payment source | Source: Ambulatory Visit | Attending: Radiation Oncology | Admitting: Radiation Oncology

## 2021-12-10 ENCOUNTER — Other Ambulatory Visit: Payer: Self-pay | Admitting: Radiation Oncology

## 2021-12-10 DIAGNOSIS — Z510 Encounter for antineoplastic radiation therapy: Secondary | ICD-10-CM | POA: Diagnosis not present

## 2021-12-10 DIAGNOSIS — C3491 Malignant neoplasm of unspecified part of right bronchus or lung: Secondary | ICD-10-CM

## 2021-12-10 MED ORDER — LIDOCAINE-PRILOCAINE 2.5-2.5 % EX CREA
1.0000 | TOPICAL_CREAM | CUTANEOUS | 2 refills | Status: DC | PRN
Start: 2021-12-10 — End: 2024-05-18

## 2021-12-10 MED ORDER — SUCRALFATE 1 G PO TABS: 1.0000 g | ORAL_TABLET | Freq: Three times a day (TID) | ORAL | 1 refills | Status: DC

## 2021-12-11 ENCOUNTER — Other Ambulatory Visit: Payer: Self-pay

## 2021-12-11 ENCOUNTER — Ambulatory Visit
Admission: RE | Admit: 2021-12-11 | Discharge: 2021-12-11 | Disposition: A | Discharge status: Home / Self Care | Payer: No Typology Code available for payment source | Source: Ambulatory Visit | Attending: Radiation Oncology | Admitting: Radiation Oncology

## 2021-12-11 DIAGNOSIS — Z510 Encounter for antineoplastic radiation therapy: Secondary | ICD-10-CM | POA: Diagnosis not present

## 2021-12-12 ENCOUNTER — Ambulatory Visit
Admission: RE | Admit: 2021-12-12 | Discharge: 2021-12-12 | Disposition: A | Discharge status: Home / Self Care | Payer: No Typology Code available for payment source | Source: Ambulatory Visit | Attending: Radiation Oncology | Admitting: Radiation Oncology

## 2021-12-12 DIAGNOSIS — Z510 Encounter for antineoplastic radiation therapy: Secondary | ICD-10-CM | POA: Diagnosis not present

## 2021-12-13 ENCOUNTER — Ambulatory Visit
Admission: RE | Admit: 2021-12-13 | Discharge: 2021-12-13 | Disposition: A | Discharge status: Home / Self Care | Payer: No Typology Code available for payment source | Source: Ambulatory Visit | Attending: Radiation Oncology | Admitting: Radiation Oncology

## 2021-12-13 ENCOUNTER — Other Ambulatory Visit: Payer: Self-pay

## 2021-12-13 DIAGNOSIS — Z510 Encounter for antineoplastic radiation therapy: Secondary | ICD-10-CM | POA: Diagnosis not present

## 2021-12-13 MED FILL — Dexamethasone Sodium Phosphate Inj 100 MG/10ML: INTRAMUSCULAR | Qty: 1 | Status: AC

## 2021-12-16 ENCOUNTER — Ambulatory Visit
Admission: RE | Admit: 2021-12-16 | Discharge: 2021-12-16 | Disposition: A | Discharge status: Home / Self Care | Payer: No Typology Code available for payment source | Source: Ambulatory Visit | Attending: Radiation Oncology | Admitting: Radiation Oncology

## 2021-12-16 ENCOUNTER — Inpatient Hospital Stay: Payer: No Typology Code available for payment source

## 2021-12-16 ENCOUNTER — Other Ambulatory Visit: Payer: Self-pay | Admitting: Radiation Oncology

## 2021-12-16 ENCOUNTER — Other Ambulatory Visit: Payer: Self-pay

## 2021-12-16 VITALS — BP 146/69 | HR 95 | Temp 97.8°F | Resp 18 | Wt 247.0 lb

## 2021-12-16 DIAGNOSIS — C3491 Malignant neoplasm of unspecified part of right bronchus or lung: Secondary | ICD-10-CM

## 2021-12-16 DIAGNOSIS — Z5111 Encounter for antineoplastic chemotherapy: Secondary | ICD-10-CM | POA: Diagnosis not present

## 2021-12-16 DIAGNOSIS — Z510 Encounter for antineoplastic radiation therapy: Secondary | ICD-10-CM | POA: Diagnosis not present

## 2021-12-16 LAB — CBC WITH DIFFERENTIAL (CANCER CENTER ONLY)
Abs Immature Granulocytes: 0.01 10*3/uL (ref 0.00–0.07)
Basophils Absolute: 0 10*3/uL (ref 0.0–0.1)
Basophils Relative: 1 %
Eosinophils Absolute: 0 10*3/uL (ref 0.0–0.5)
Eosinophils Relative: 1 %
HCT: 36.4 % — ABNORMAL LOW (ref 39.0–52.0)
Hemoglobin: 12.3 g/dL — ABNORMAL LOW (ref 13.0–17.0)
Immature Granulocytes: 0 %
Lymphocytes Relative: 18 %
Lymphs Abs: 0.4 10*3/uL — ABNORMAL LOW (ref 0.7–4.0)
MCH: 32.8 pg (ref 26.0–34.0)
MCHC: 33.8 g/dL (ref 30.0–36.0)
MCV: 97.1 fL (ref 80.0–100.0)
Monocytes Absolute: 0.2 10*3/uL (ref 0.1–1.0)
Monocytes Relative: 8 %
Neutro Abs: 1.7 10*3/uL (ref 1.7–7.7)
Neutrophils Relative %: 72 %
Platelet Count: 127 10*3/uL — ABNORMAL LOW (ref 150–400)
RBC: 3.75 MIL/uL — ABNORMAL LOW (ref 4.22–5.81)
RDW: 16.1 % — ABNORMAL HIGH (ref 11.5–15.5)
WBC Count: 2.4 10*3/uL — ABNORMAL LOW (ref 4.0–10.5)
nRBC: 0 % (ref 0.0–0.2)

## 2021-12-16 LAB — CMP (CANCER CENTER ONLY)
ALT: 11 U/L (ref 0–44)
AST: 17 U/L (ref 15–41)
Albumin: 3.5 g/dL (ref 3.5–5.0)
Alkaline Phosphatase: 106 U/L (ref 38–126)
Anion gap: 8 (ref 5–15)
BUN: 20 mg/dL (ref 8–23)
CO2: 22 mmol/L (ref 22–32)
Calcium: 9.4 mg/dL (ref 8.9–10.3)
Chloride: 113 mmol/L — ABNORMAL HIGH (ref 98–111)
Creatinine: 1.45 mg/dL — ABNORMAL HIGH (ref 0.61–1.24)
GFR, Estimated: 50 mL/min — ABNORMAL LOW (ref >60)
Glucose, Bld: 224 mg/dL — ABNORMAL HIGH (ref 70–99)
Potassium: 4.4 mmol/L (ref 3.5–5.1)
Sodium: 143 mmol/L (ref 135–145)
Total Bilirubin: 0.3 mg/dL (ref 0.3–1.2)
Total Protein: 6.6 g/dL (ref 6.5–8.1)

## 2021-12-16 MED ORDER — PALONOSETRON HCL INJECTION 0.25 MG/5ML
0.2500 mg | Freq: Once | INTRAVENOUS | Status: AC
  Administered 2021-12-16: 14:00:00 0.25 mg via INTRAVENOUS
  Filled 2021-12-16: qty 5

## 2021-12-16 MED ORDER — FAMOTIDINE 20 MG IN NS 100 ML IVPB
20.0000 mg | Freq: Once | INTRAVENOUS | Status: AC
  Administered 2021-12-16: 14:00:00 20 mg via INTRAVENOUS
  Filled 2021-12-16: qty 100

## 2021-12-16 MED ORDER — SODIUM CHLORIDE 0.9 % IV SOLN
200.0000 mg | Freq: Once | INTRAVENOUS | Status: AC
  Administered 2021-12-16: 16:00:00 200 mg via INTRAVENOUS
  Filled 2021-12-16: qty 20

## 2021-12-16 MED ORDER — DIPHENHYDRAMINE HCL 50 MG/ML IJ SOLN
50.0000 mg | Freq: Once | INTRAMUSCULAR | Status: AC
  Administered 2021-12-16: 14:00:00 50 mg via INTRAVENOUS
  Filled 2021-12-16: qty 1

## 2021-12-16 MED ORDER — SODIUM CHLORIDE 0.9 % IV SOLN
45.0000 mg/m2 | Freq: Once | INTRAVENOUS | Status: AC
  Administered 2021-12-16: 15:00:00 102 mg via INTRAVENOUS
  Filled 2021-12-16: qty 17

## 2021-12-16 MED ORDER — SODIUM CHLORIDE 0.9 % IV SOLN
10.0000 mg | Freq: Once | INTRAVENOUS | Status: AC
  Administered 2021-12-16: 15:00:00 10 mg via INTRAVENOUS
  Filled 2021-12-16: qty 10

## 2021-12-16 MED ORDER — SODIUM CHLORIDE 0.9 % IV SOLN: Freq: Once | INTRAVENOUS | Status: AC

## 2021-12-16 NOTE — Patient Instructions (Signed)
[facility]COL[facility]   Discharge Instructions: Thank you for choosing [facility] to provide your oncology and hematology care.   If you have a lab appointment with the Cancer Center, please go directly to the Cancer Center and check in at the registration area.   Wear comfortable clothing and clothing appropriate for easy access to any Portacath or PICC line.   We strive to give you quality time with your provider. You may need to reschedule your appointment if you arrive late (15 or more minutes).  Arriving late affects you and other patients whose appointments are after yours.  Also, if you miss three or more appointments without notifying the office, you may be dismissed from the clinic at the provider's discretion.      For prescription refill requests, have your pharmacy contact our office and allow 72 hours for refills to be completed.    Today you received the following chemotherapy and/or immunotherapy agents: paclitaxel and carboplatin.      To help prevent nausea and vomiting after your treatment, we encourage you to take your nausea medication as directed.  BELOW ARE SYMPTOMS THAT SHOULD BE REPORTED IMMEDIATELY: *FEVER GREATER THAN 100.4 F (38 C) OR HIGHER *CHILLS OR SWEATING *NAUSEA AND VOMITING THAT IS NOT CONTROLLED WITH YOUR NAUSEA MEDICATION *UNUSUAL SHORTNESS OF BREATH *UNUSUAL BRUISING OR BLEEDING *URINARY PROBLEMS (pain or burning when urinating, or frequent urination) *BOWEL PROBLEMS (unusual diarrhea, constipation, pain near the anus) TENDERNESS IN MOUTH AND THROAT WITH OR WITHOUT PRESENCE OF ULCERS (sore throat, sores in mouth, or a toothache) UNUSUAL RASH, SWELLING OR PAIN  UNUSUAL VAGINAL DISCHARGE OR ITCHING   Items with * indicate a potential emergency and should be followed up as soon as possible or go to the Emergency Department if any problems should occur.  Please show the CHEMOTHERAPY ALERT CARD or IMMUNOTHERAPY ALERT  CARD at check-in to the Emergency Department and triage nurse.  Should you have questions after your visit or need to cancel or reschedule your appointment, please contact [facility]  Dept: [phone]  and follow the prompts.  Office hours are 8:00 a.m. to 4:30 p.m. Monday - Friday. Please note that voicemails left after 4:00 p.m. may not be returned until the following business day.  We are closed weekends and major holidays. You have access to a nurse at all times for urgent questions. Please call the main number to the clinic Dept: [phone] and follow the prompts.   For any non-urgent questions, you may also contact your provider using MyChart. We now offer e-Visits for anyone 18 and older to request care online for non-urgent symptoms. For details visit [email or web address].   Also download the MyChart app! Go to the app store, search "MyChart", open the app, select [facility], and log in with your MyChart username and password.  Due to Covid, a mask is required upon entering the hospital/clinic. If you do not have a mask, one will be given to you upon arrival. For doctor visits, patients may have 1 support person aged 18 or older with them. For treatment visits, patients cannot have anyone with them due to current Covid guidelines and our immunocompromised population.   

## 2021-12-17 ENCOUNTER — Ambulatory Visit
Admission: RE | Admit: 2021-12-17 | Discharge: 2021-12-17 | Disposition: A | Discharge status: Home / Self Care | Payer: No Typology Code available for payment source | Source: Ambulatory Visit | Attending: Radiation Oncology | Admitting: Radiation Oncology

## 2021-12-17 ENCOUNTER — Ambulatory Visit: Payer: No Typology Code available for payment source

## 2021-12-17 DIAGNOSIS — Z510 Encounter for antineoplastic radiation therapy: Secondary | ICD-10-CM | POA: Diagnosis not present

## 2021-12-18 ENCOUNTER — Ambulatory Visit
Admission: RE | Admit: 2021-12-18 | Discharge: 2021-12-18 | Disposition: A | Discharge status: Home / Self Care | Payer: No Typology Code available for payment source | Source: Ambulatory Visit | Attending: Radiation Oncology | Admitting: Radiation Oncology

## 2021-12-18 ENCOUNTER — Other Ambulatory Visit: Payer: Self-pay

## 2021-12-18 DIAGNOSIS — Z510 Encounter for antineoplastic radiation therapy: Secondary | ICD-10-CM | POA: Diagnosis not present

## 2021-12-18 NOTE — Progress Notes (Signed)
[place] [address]  DIAGNOSIS: Stage IIIB (T3b, N2, M0) non-small cell lung cancer, adenocarcinoma presented with large right lower lobe lung mass and suspicious right lower paratracheal lymphadenopathy diagnosed in June 2022.   Molecular studies by [employer] showed no actionable mutations  PRIOR THERAPY: Neoadjuvant systemic chemotherapy according to the Checkmate 816 with carboplatin for AUC of 5, Alimta 500 Mg/M2 and nivolumab 360 Mg IV every 3 weeks for 3 cycles.  Last dose 09/24/21.  Status post 3 cycles.   CURRENT THERAPY: Concurrent chemoradiation with carboplatin for an AUC of 2 and paclitaxel 45 mg per metered square.  First dose on 11/18/2021. Status post 5 cycles.   INTERVAL HISTORY: Paul [name] 75 y.o. male returns to the clinic today for a follow-up visit.  Overall, the patient is feeling well today without any concerning complaints. He is tolerating his treatment with concurrent chemoradiation well without any concerning adverse side effects. His last day of radiation is scheduled for 01/09/21.  He is supposed to have a Port-A-Cath placed on 12/31/2021; ever, he is almost completed treatment at this time.  He is not interested in having a Port-A-Cath at this time.  He denies any recent fever, chills, or night sweats. He estimates that he lost about 5 lbs since his last appointment. He has mild radiation induced esophagitis at this time for which he has carafate. He denies any chest pain or hemoptysis. He states his breathing is "pretty good". He sometimes has dyspnea on exertion depending on the activity. He has a mild cough reportedly for 1.5 months after receiving the flu shot. He reports clear phlegm. He takes Alka-Seltzer reportedly for his cough. He did try taking robitussin once which did help.  He continues to smoke about 1/2 a pack of cigarettes daily which is an  improvement for him compared to his baseline 2 packs of cigarettes per day. He denies any nausea, vomiting, diarrhea, or constipation.  He denies any headache or visual changes.  He is here today for evaluation and repeat blood work before starting cycle #6.  MEDICAL HISTORY: Past Medical History:  Diagnosis Date   CHF (congestive heart failure) ([place])    Coronary artery disease    Diabetes mellitus without complication ([place])    Hypertension    Hypothyroidism    Ischemic cardiomyopathy    Pneumonia    a long time ago   Sleep apnea    no longer uses a cpap    ALLERGIES:  has No Known Allergies.  MEDICATIONS:  Current Outpatient Medications  Medication Sig Dispense Refill   aspirin 81 MG chewable tablet Take 81 mg by mouth daily.     blood glucose meter kit and supplies KIT Dispense based on patient and insurance preference. Use up to four times daily as directed. 1 each 0   carvedilol (COREG) 25 MG tablet Take 25 mg by mouth at bedtime.     folic acid (FOLVITE) 1 MG tablet Take 1 tablet (1 mg total) by mouth daily. 30 tablet 4   furosemide (LASIX) 20 MG tablet Take 20 mg by mouth at bedtime.     insulin glargine (LANTUS) 100 UNIT/ML Solostar Pen Inject 10 Units into the skin daily. 5 mL 5   levothyroxine (SYNTHROID) 25 MCG tablet Take 25 mcg by mouth at bedtime.     lidocaine-prilocaine (EMLA) cream Apply 1 application topically as needed. 30 g 2   Menthol-Methyl  Salicylate (MUSCLE RUB) 10-15 % CREA Apply 1 application topically daily as needed for muscle pain.     metFORMIN (GLUCOPHAGE) 500 MG tablet Take 1 tablet (500 mg total) by mouth 2 (two) times daily with a meal. 60 tablet 3   Multiple Vitamins-Minerals (MULTIVITAMIN WITH MINERALS) tablet Take 1 tablet by mouth daily.     nitroGLYCERIN (NITROSTAT) 0.4 MG SL tablet Place 0.4 mg under the tongue every 5 (five) minutes as needed for chest pain.     omeprazole (PRILOSEC) 20 MG capsule Take 20 mg by mouth daily. (Patient not  taking: Reported on 11/20/2021)     prochlorperazine (COMPAZINE) 10 MG tablet Take 1 tablet (10 mg total) by mouth every 6 (six) hours as needed for nausea or vomiting. 30 tablet 0   sacubitril-valsartan (ENTRESTO) 49-51 MG Take 0.5 tablets by mouth 2 (two) times daily.     sucralfate (CARAFATE) 1 g tablet Take 1 tablet (1 g total) by mouth 4 (four) times daily -  with meals and at bedtime. Crush and dissolve in 10 mL of warm water prior to swallowing, take before meals 120 tablet 1   No current facility-administered medications for this visit.    SURGICAL HISTORY:  Past Surgical History:  Procedure Laterality Date   BRONCHIAL BIOPSY  06/04/2021   Procedure: BRONCHIAL BIOPSIES;  Surgeon: Garner Nash, DO;  Location: [facility];  Service: Pulmonary;;   BRONCHIAL BRUSHINGS  06/04/2021   Procedure: BRONCHIAL BRUSHINGS;  Surgeon: Garner Nash, DO;  Location: [facility];  Service: Pulmonary;;   BRONCHIAL NEEDLE ASPIRATION BIOPSY  06/04/2021   Procedure: BRONCHIAL NEEDLE ASPIRATION BIOPSIES;  Surgeon: Garner Nash, DO;  Location: [facility];  Service: Pulmonary;;   BRONCHIAL WASHINGS  06/04/2021   Procedure: BRONCHIAL WASHINGS;  Surgeon: Garner Nash, DO;  Location: [facility];  Service: Pulmonary;;   CARDIAC SURGERY     COLONOSCOPY     CORONARY ARTERY BYPASS GRAFT     2019 at [facility] Right 06/04/2021   Procedure: [place];  Surgeon: Garner Nash, DO;  Location: [facility];  Service: Pulmonary;  Laterality: Right;   VIDEO BRONCHOSCOPY WITH ENDOBRONCHIAL ULTRASOUND  06/04/2021   Procedure: VIDEO BRONCHOSCOPY WITH ENDOBRONCHIAL ULTRASOUND;  Surgeon: Garner Nash, DO;  Location: [facility];  Service: Pulmonary;;    REVIEW OF SYSTEMS:   Constitutional: Positive for 5 pound weight loss and fatigue.  Negative for appetite change, chills and fever HENT: Mild radiation-induced  esophagitis.  Negative for mouth sores, nosebleeds, and trouble swallowing.   Eyes: Negative for eye problems and icterus.  Respiratory: Positive for minor cough and occasional dyspnea with certain strenuous activities.  Negative for hemoptysis and wheezing.   Cardiovascular: Negative for chest pain and leg swelling.  Gastrointestinal: Negative for abdominal pain, constipation, diarrhea, nausea and vomiting.  Genitourinary: Negative for bladder incontinence, difficulty urinating, dysuria, frequency and hematuria.   Musculoskeletal: Negative for back pain, gait problem, neck pain and neck stiffness.  Skin: Negative for itching and rash.  Neurological: Negative for dizziness, extremity weakness, gait problem, headaches, light-headedness and seizures.  Hematological: Negative for adenopathy. Does not bruise/bleed easily.  Psychiatric/Behavioral: Negative for confusion, depression and sleep disturbance. The patient is not nervous/anxious.     PHYSICAL EXAMINATION:  Blood pressure 125/80, pulse 96, temperature (!) 97.2 F (36.2 C), temperature source Tympanic, resp. rate 20, height _0  (1.727 m), weight 249 lb 3.2 oz (113 kg), SpO2 100 %.  ECOG PERFORMANCE STATUS: 1  Physical Exam  Constitutional: Oriented to person, place, and time and well-developed, well-nourished, and in no distress.   HENT:  Head: Normocephalic and atraumatic.  Mouth/Throat: Oropharynx is clear and moist. No oropharyngeal exudate.  Eyes: Conjunctivae are normal. Right eye exhibits no discharge. Left eye exhibits no discharge. No scleral icterus.  Neck: Normal range of motion. Neck supple.  Cardiovascular: Normal rate, regular rhythm, normal heart sounds and intact distal pulses.   Pulmonary/Chest: Effort normal and breath sounds normal. No respiratory distress. No wheezes. No rales.  Abdominal: Soft. Bowel sounds are normal. Exhibits no distension and no mass. There is no tenderness.  Musculoskeletal: Normal range of  motion. Exhibits no edema.  Lymphadenopathy:    No cervical adenopathy.  Neurological: Alert and oriented to person, place, and time. Exhibits normal muscle tone. Gait normal. Coordination normal.  Skin: Skin is warm and dry. No rash noted. Not diaphoretic. No erythema. No pallor.  Psychiatric: Mood, memory and judgment normal.  Vitals reviewed.  LABORATORY DATA: Lab Results  Component Value Date   WBC 1.5 (L) 12/25/2021   HGB 11.4 (L) 12/25/2021   HCT 33.9 (L) 12/25/2021   MCV 98.0 12/25/2021   PLT 85 (L) 12/25/2021      Chemistry      Component Value Date/Time   NA 143 12/25/2021 0848   K 3.9 12/25/2021 0848   CL 110 12/25/2021 0848   CO2 27 12/25/2021 0848   BUN 20 12/25/2021 0848   CREATININE 1.78 (H) 12/25/2021 0848      Component Value Date/Time   CALCIUM 9.2 12/25/2021 0848   ALKPHOS 91 12/25/2021 0848   AST 19 12/25/2021 0848   ALT 10 12/25/2021 0848   BILITOT 0.4 12/25/2021 0848       RADIOGRAPHIC STUDIES:  No results found.   ASSESSMENT/PLAN:  This is a very pleasant 75 year old African-American male diagnosed with stage IIIb (T3b, N2, M0) non-small cell lung cancer, adenocarcinoma.  He presented with a large right upper lobe lung mass and suspicious right lower lobe paratracheal lymphadenopathy.  He was diagnosed in June 2022.  His molecular studies by [facility] [employer] were negative for any actionable mutations.  The patient completed 3 cycles of neoadjuvant chemotherapy according to the Checkmate 816 trial with carboplatin for an AUC of 5, Alimta 500 mg per metered square, and nivolumab 360 mg IV every 3 weeks.  The patient status post 3 cycles.  His last dose was on 09/24/2021.  Upon reimaging after completion of this course, there was not significant improvement in the size of his mass and therefore, it does not appear that we are able to downstage his disease to make him eligible for surgical resection.  Therefore, Dr. Julien Nordmann recommended that the patient  start concurrent chemoradiation with carboplatin for an AUC of 2 and paclitaxel 45 mg per metered squared.  The patient status post 5 cycles and he has been tolerating it well.  Labs were reviewed.  His total white blood cell count is low 1.5 today.  His ANC is 0.8.  His platelet count is 85,000.  We will cancel chemotherapy for today.  I discussed with the patient that he does need to continue radiation as scheduled this week.  He will proceed with cycle #6 next week instead.  I reviewed neutropenic precautions with the patient.  He does not have any signs and symptoms of infection at this time.    We will see him back for follow-up visit in 2  weeks for evaluation before starting cycle #7.   Advised he may use delsym or robitussin if needed for his baseline cough.    Strongly encouraged him to quit smoking as it will help his appetite and baseline cough.   We will reach out to dimensional radiology to cancel his Port-A-Cath.  The patient is not interested in a Port-A-Cath at this time.  The patient was advised to call immediately if he has any concerning symptoms in the interval. The patient voices understanding of current disease status and treatment options and is in agreement with the current care plan. All questions were answered. The patient knows to call the clinic with any problems, questions or concerns. We can certainly see the patient much sooner if necessary  No orders of the defined types were placed in this encounter.    The total time spent in the appointment was 20-29 minutes.  Yajahira Tison L Cordell Guercio, PA-C 12/25/21

## 2021-12-19 ENCOUNTER — Ambulatory Visit
Admission: RE | Admit: 2021-12-19 | Discharge: 2021-12-19 | Disposition: A | Discharge status: Home / Self Care | Payer: No Typology Code available for payment source | Source: Ambulatory Visit | Attending: Radiation Oncology | Admitting: Radiation Oncology

## 2021-12-19 DIAGNOSIS — Z510 Encounter for antineoplastic radiation therapy: Secondary | ICD-10-CM | POA: Diagnosis not present

## 2021-12-20 ENCOUNTER — Other Ambulatory Visit: Payer: Self-pay

## 2021-12-20 ENCOUNTER — Ambulatory Visit
Admission: RE | Admit: 2021-12-20 | Discharge: 2021-12-20 | Disposition: A | Discharge status: Home / Self Care | Payer: No Typology Code available for payment source | Source: Ambulatory Visit | Attending: Radiation Oncology | Admitting: Radiation Oncology

## 2021-12-20 DIAGNOSIS — Z510 Encounter for antineoplastic radiation therapy: Secondary | ICD-10-CM | POA: Diagnosis not present

## 2021-12-24 ENCOUNTER — Other Ambulatory Visit: Payer: Self-pay

## 2021-12-24 ENCOUNTER — Ambulatory Visit
Admission: RE | Admit: 2021-12-24 | Discharge: 2021-12-24 | Disposition: A | Discharge status: Home / Self Care | Payer: No Typology Code available for payment source | Source: Ambulatory Visit | Attending: Radiation Oncology | Admitting: Radiation Oncology

## 2021-12-24 DIAGNOSIS — Z510 Encounter for antineoplastic radiation therapy: Secondary | ICD-10-CM | POA: Diagnosis not present

## 2021-12-25 ENCOUNTER — Inpatient Hospital Stay: Payer: No Typology Code available for payment source

## 2021-12-25 ENCOUNTER — Inpatient Hospital Stay (HOSPITAL_BASED_OUTPATIENT_CLINIC_OR_DEPARTMENT_OTHER): Payer: No Typology Code available for payment source | Admitting: Physician Assistant

## 2021-12-25 ENCOUNTER — Ambulatory Visit
Admission: RE | Admit: 2021-12-25 | Discharge: 2021-12-25 | Disposition: A | Discharge status: Home / Self Care | Payer: No Typology Code available for payment source | Source: Ambulatory Visit | Attending: Radiation Oncology | Admitting: Radiation Oncology

## 2021-12-25 VITALS — BP 125/80 | HR 96 | Temp 97.2°F | Resp 20 | Ht 68.0 in | Wt 249.2 lb

## 2021-12-25 DIAGNOSIS — C3431 Malignant neoplasm of lower lobe, right bronchus or lung: Secondary | ICD-10-CM

## 2021-12-25 DIAGNOSIS — E119 Type 2 diabetes mellitus without complications: Secondary | ICD-10-CM

## 2021-12-25 DIAGNOSIS — I10 Essential (primary) hypertension: Secondary | ICD-10-CM | POA: Diagnosis not present

## 2021-12-25 DIAGNOSIS — C3491 Malignant neoplasm of unspecified part of right bronchus or lung: Secondary | ICD-10-CM

## 2021-12-25 DIAGNOSIS — Z5111 Encounter for antineoplastic chemotherapy: Secondary | ICD-10-CM

## 2021-12-25 DIAGNOSIS — Z510 Encounter for antineoplastic radiation therapy: Secondary | ICD-10-CM | POA: Diagnosis not present

## 2021-12-25 LAB — CMP (CANCER CENTER ONLY)
ALT: 10 U/L (ref 0–44)
AST: 19 U/L (ref 15–41)
Albumin: 3.7 g/dL (ref 3.5–5.0)
Alkaline Phosphatase: 91 U/L (ref 38–126)
Anion gap: 6 (ref 5–15)
BUN: 20 mg/dL (ref 8–23)
CO2: 27 mmol/L (ref 22–32)
Calcium: 9.2 mg/dL (ref 8.9–10.3)
Chloride: 110 mmol/L (ref 98–111)
Creatinine: 1.78 mg/dL — ABNORMAL HIGH (ref 0.61–1.24)
GFR, Estimated: 39 mL/min — ABNORMAL LOW (ref >60)
Glucose, Bld: 148 mg/dL — ABNORMAL HIGH (ref 70–99)
Potassium: 3.9 mmol/L (ref 3.5–5.1)
Sodium: 143 mmol/L (ref 135–145)
Total Bilirubin: 0.4 mg/dL (ref 0.3–1.2)
Total Protein: 6.6 g/dL (ref 6.5–8.1)

## 2021-12-25 LAB — CBC WITH DIFFERENTIAL (CANCER CENTER ONLY)
Abs Immature Granulocytes: 0.01 10*3/uL (ref 0.00–0.07)
Basophils Absolute: 0 10*3/uL (ref 0.0–0.1)
Basophils Relative: 1 %
Eosinophils Absolute: 0 10*3/uL (ref 0.0–0.5)
Eosinophils Relative: 1 %
HCT: 33.9 % — ABNORMAL LOW (ref 39.0–52.0)
Hemoglobin: 11.4 g/dL — ABNORMAL LOW (ref 13.0–17.0)
Immature Granulocytes: 1 %
Lymphocytes Relative: 21 %
Lymphs Abs: 0.3 10*3/uL — ABNORMAL LOW (ref 0.7–4.0)
MCH: 32.9 pg (ref 26.0–34.0)
MCHC: 33.6 g/dL (ref 30.0–36.0)
MCV: 98 fL (ref 80.0–100.0)
Monocytes Absolute: 0.2 10*3/uL (ref 0.1–1.0)
Monocytes Relative: 15 %
Neutro Abs: 0.9 10*3/uL — ABNORMAL LOW (ref 1.7–7.7)
Neutrophils Relative %: 61 %
Platelet Count: 85 10*3/uL — ABNORMAL LOW (ref 150–400)
RBC: 3.46 MIL/uL — ABNORMAL LOW (ref 4.22–5.81)
RDW: 15.2 % (ref 11.5–15.5)
WBC Count: 1.5 10*3/uL — ABNORMAL LOW (ref 4.0–10.5)
nRBC: 3.3 % — ABNORMAL HIGH (ref 0.0–0.2)

## 2021-12-26 ENCOUNTER — Ambulatory Visit
Admission: RE | Admit: 2021-12-26 | Discharge: 2021-12-26 | Disposition: A | Discharge status: Home / Self Care | Payer: No Typology Code available for payment source | Source: Ambulatory Visit | Attending: Radiation Oncology | Admitting: Radiation Oncology

## 2021-12-26 ENCOUNTER — Other Ambulatory Visit: Payer: Self-pay

## 2021-12-26 DIAGNOSIS — Z510 Encounter for antineoplastic radiation therapy: Secondary | ICD-10-CM | POA: Diagnosis not present

## 2021-12-27 ENCOUNTER — Ambulatory Visit: Payer: No Typology Code available for payment source

## 2021-12-31 ENCOUNTER — Other Ambulatory Visit: Payer: Self-pay

## 2021-12-31 ENCOUNTER — Ambulatory Visit (HOSPITAL_COMMUNITY): Payer: No Typology Code available for payment source

## 2021-12-31 ENCOUNTER — Ambulatory Visit
Admission: RE | Admit: 2021-12-31 | Discharge: 2021-12-31 | Disposition: A | Discharge status: Home / Self Care | Payer: No Typology Code available for payment source | Source: Ambulatory Visit | Attending: Radiation Oncology | Admitting: Radiation Oncology

## 2021-12-31 ENCOUNTER — Other Ambulatory Visit (HOSPITAL_COMMUNITY): Payer: No Typology Code available for payment source

## 2021-12-31 DIAGNOSIS — Z510 Encounter for antineoplastic radiation therapy: Secondary | ICD-10-CM | POA: Diagnosis present

## 2021-12-31 DIAGNOSIS — C3431 Malignant neoplasm of lower lobe, right bronchus or lung: Secondary | ICD-10-CM | POA: Diagnosis present

## 2021-12-31 MED FILL — Dexamethasone Sodium Phosphate Inj 100 MG/10ML: INTRAMUSCULAR | Qty: 1 | Status: AC

## 2022-01-01 ENCOUNTER — Encounter: Payer: Self-pay | Admitting: Internal Medicine

## 2022-01-01 ENCOUNTER — Ambulatory Visit
Admission: RE | Admit: 2022-01-01 | Discharge: 2022-01-01 | Disposition: A | Discharge status: Home / Self Care | Payer: No Typology Code available for payment source | Source: Ambulatory Visit | Attending: Radiation Oncology | Admitting: Radiation Oncology

## 2022-01-01 ENCOUNTER — Inpatient Hospital Stay: Payer: No Typology Code available for payment source

## 2022-01-01 ENCOUNTER — Inpatient Hospital Stay: Payer: No Typology Code available for payment source | Attending: Internal Medicine

## 2022-01-01 VITALS — BP 117/67 | HR 85 | Temp 98.2°F | Resp 17 | Wt 255.2 lb

## 2022-01-01 DIAGNOSIS — C3431 Malignant neoplasm of lower lobe, right bronchus or lung: Secondary | ICD-10-CM | POA: Insufficient documentation

## 2022-01-01 DIAGNOSIS — Z5111 Encounter for antineoplastic chemotherapy: Secondary | ICD-10-CM | POA: Insufficient documentation

## 2022-01-01 DIAGNOSIS — C3491 Malignant neoplasm of unspecified part of right bronchus or lung: Secondary | ICD-10-CM

## 2022-01-01 DIAGNOSIS — Z510 Encounter for antineoplastic radiation therapy: Secondary | ICD-10-CM | POA: Diagnosis not present

## 2022-01-01 LAB — CBC WITH DIFFERENTIAL (CANCER CENTER ONLY)
Abs Immature Granulocytes: 0.02 10*3/uL (ref 0.00–0.07)
Basophils Absolute: 0 10*3/uL (ref 0.0–0.1)
Basophils Relative: 0 %
Eosinophils Absolute: 0 10*3/uL (ref 0.0–0.5)
Eosinophils Relative: 1 %
HCT: 31 % — ABNORMAL LOW (ref 39.0–52.0)
Hemoglobin: 10.4 g/dL — ABNORMAL LOW (ref 13.0–17.0)
Immature Granulocytes: 1 %
Lymphocytes Relative: 10 %
Lymphs Abs: 0.3 10*3/uL — ABNORMAL LOW (ref 0.7–4.0)
MCH: 32.8 pg (ref 26.0–34.0)
MCHC: 33.5 g/dL (ref 30.0–36.0)
MCV: 97.8 fL (ref 80.0–100.0)
Monocytes Absolute: 0.6 10*3/uL (ref 0.1–1.0)
Monocytes Relative: 19 %
Neutro Abs: 2.1 10*3/uL (ref 1.7–7.7)
Neutrophils Relative %: 69 %
Platelet Count: 115 10*3/uL — ABNORMAL LOW (ref 150–400)
RBC: 3.17 MIL/uL — ABNORMAL LOW (ref 4.22–5.81)
RDW: 15.9 % — ABNORMAL HIGH (ref 11.5–15.5)
WBC Count: 3.1 10*3/uL — ABNORMAL LOW (ref 4.0–10.5)
nRBC: 1 % — ABNORMAL HIGH (ref 0.0–0.2)

## 2022-01-01 LAB — CMP (CANCER CENTER ONLY)
ALT: 11 U/L (ref 0–44)
AST: 19 U/L (ref 15–41)
Albumin: 3.5 g/dL (ref 3.5–5.0)
Alkaline Phosphatase: 110 U/L (ref 38–126)
Anion gap: 5 (ref 5–15)
BUN: 13 mg/dL (ref 8–23)
CO2: 22 mmol/L (ref 22–32)
Calcium: 8.2 mg/dL — ABNORMAL LOW (ref 8.9–10.3)
Chloride: 112 mmol/L — ABNORMAL HIGH (ref 98–111)
Creatinine: 1.2 mg/dL (ref 0.61–1.24)
GFR, Estimated: >60 mL/min (ref >60)
Glucose, Bld: 205 mg/dL — ABNORMAL HIGH (ref 70–99)
Potassium: 4.5 mmol/L (ref 3.5–5.1)
Sodium: 139 mmol/L (ref 135–145)
Total Bilirubin: 0.5 mg/dL (ref 0.3–1.2)
Total Protein: 6.6 g/dL (ref 6.5–8.1)

## 2022-01-01 MED ORDER — SODIUM CHLORIDE 0.9 % IV SOLN
200.0000 mg | Freq: Once | INTRAVENOUS | Status: AC
  Administered 2022-01-01: 200 mg via INTRAVENOUS
  Filled 2022-01-01: qty 20

## 2022-01-01 MED ORDER — SODIUM CHLORIDE 0.9 % IV SOLN
45.0000 mg/m2 | Freq: Once | INTRAVENOUS | Status: AC
  Administered 2022-01-01: 102 mg via INTRAVENOUS
  Filled 2022-01-01: qty 17

## 2022-01-01 MED ORDER — SODIUM CHLORIDE 0.9 % IV SOLN: Freq: Once | INTRAVENOUS | Status: AC

## 2022-01-01 MED ORDER — SODIUM CHLORIDE 0.9 % IV SOLN
10.0000 mg | Freq: Once | INTRAVENOUS | Status: AC
  Administered 2022-01-01: 10 mg via INTRAVENOUS
  Filled 2022-01-01: qty 10

## 2022-01-01 MED ORDER — DIPHENHYDRAMINE HCL 50 MG/ML IJ SOLN
50.0000 mg | Freq: Once | INTRAMUSCULAR | Status: AC
  Administered 2022-01-01: 50 mg via INTRAVENOUS
  Filled 2022-01-01: qty 1

## 2022-01-01 MED ORDER — FAMOTIDINE 20 MG IN NS 100 ML IVPB
20.0000 mg | Freq: Once | INTRAVENOUS | Status: AC
  Administered 2022-01-01: 20 mg via INTRAVENOUS
  Filled 2022-01-01: qty 100

## 2022-01-01 MED ORDER — PALONOSETRON HCL INJECTION 0.25 MG/5ML
0.2500 mg | Freq: Once | INTRAVENOUS | Status: AC
  Administered 2022-01-01: 0.25 mg via INTRAVENOUS
  Filled 2022-01-01: qty 5

## 2022-01-01 NOTE — Patient Instructions (Addendum)
[facility]  Discharge Instructions: Thank you for choosing [facility] to provide your oncology and hematology care.   If you have a lab appointment with the [facility], please go directly to the [facility] and check in at the registration area.   Wear comfortable clothing and clothing appropriate for easy access to any Portacath or PICC line.   We strive to give you quality time with your provider. You may need to reschedule your appointment if you arrive late (15 or more minutes).  Arriving late affects you and other patients whose appointments are after yours.  Also, if you miss three or more appointments without notifying the office, you may be dismissed from the clinic at the providers discretion.      For prescription refill requests, have your pharmacy contact our office and allow 72 hours for refills to be completed.    Today you received the following chemotherapy and/or immunotherapy agents: Paclitaxel and Carboplatin   To help prevent nausea and vomiting after your treatment, we encourage you to take your nausea medication as directed.  BELOW ARE SYMPTOMS THAT SHOULD BE REPORTED IMMEDIATELY: *FEVER GREATER THAN 100.4 F (38 C) OR HIGHER *CHILLS OR SWEATING *NAUSEA AND VOMITING THAT IS NOT CONTROLLED WITH YOUR NAUSEA MEDICATION *UNUSUAL SHORTNESS OF BREATH *UNUSUAL BRUISING OR BLEEDING *URINARY PROBLEMS (pain or burning when urinating, or frequent urination) *BOWEL PROBLEMS (unusual diarrhea, constipation, pain near the anus) TENDERNESS IN MOUTH AND THROAT WITH OR WITHOUT PRESENCE OF ULCERS (sore throat, sores in mouth, or a toothache) UNUSUAL RASH, SWELLING OR PAIN  UNUSUAL VAGINAL DISCHARGE OR ITCHING   Items with * indicate a potential emergency and should be followed up as soon as possible or go to the Emergency Department if any problems should occur.  Please show the CHEMOTHERAPY ALERT CARD or IMMUNOTHERAPY ALERT CARD  at check-in to the Emergency Department and triage nurse.  Should you have questions after your visit or need to cancel or reschedule your appointment, please contact [facility]  Dept: [phone]  and follow the prompts.  Office hours are 8:00 a.m. to 4:30 p.m. Monday - Friday. Please note that voicemails left after 4:00 p.m. may not be returned until the following business day.  We are closed weekends and major holidays. You have access to a nurse at all times for urgent questions. Please call the main number to the clinic Dept: [phone] and follow the prompts.   For any non-urgent questions, you may also contact your provider using MyChart. We now offer e-Visits for anyone 77 and older to request care online for non-urgent symptoms. For details visit [email or web address].   Also download the MyChart app! Go to the app store, search "MyChart", open the app, select [facility], and log in with your MyChart username and password.  Due to Covid, a mask is required upon entering the hospital/clinic. If you do not have a mask, one will be given to you upon arrival. For doctor visits, patients may have 1 support person aged 14 or older with them. For treatment visits, patients cannot have anyone with them due to current Covid guidelines and our immunocompromised population.

## 2022-01-02 ENCOUNTER — Other Ambulatory Visit: Payer: Self-pay

## 2022-01-02 ENCOUNTER — Ambulatory Visit
Admission: RE | Admit: 2022-01-02 | Discharge: 2022-01-02 | Disposition: A | Discharge status: Home / Self Care | Payer: No Typology Code available for payment source | Source: Ambulatory Visit | Attending: Radiation Oncology | Admitting: Radiation Oncology

## 2022-01-02 DIAGNOSIS — Z510 Encounter for antineoplastic radiation therapy: Secondary | ICD-10-CM | POA: Diagnosis not present

## 2022-01-03 ENCOUNTER — Ambulatory Visit
Admission: RE | Admit: 2022-01-03 | Discharge: 2022-01-03 | Disposition: A | Discharge status: Home / Self Care | Payer: No Typology Code available for payment source | Source: Ambulatory Visit | Attending: Radiation Oncology | Admitting: Radiation Oncology

## 2022-01-03 ENCOUNTER — Other Ambulatory Visit: Payer: Self-pay

## 2022-01-03 DIAGNOSIS — Z510 Encounter for antineoplastic radiation therapy: Secondary | ICD-10-CM | POA: Diagnosis not present

## 2022-01-04 NOTE — Progress Notes (Signed)
[place] [id number]  DIAGNOSIS: Stage IIIB (T3b, N2, M0) non-small cell lung cancer, adenocarcinoma presented with large right lower lobe lung mass and suspicious right lower paratracheal lymphadenopathy diagnosed in June 2022.   Molecular studies by [employer] showed no actionable mutations  PRIOR THERAPY: Neoadjuvant systemic chemotherapy according to the Checkmate 816 with carboplatin for AUC of 5, Alimta 500 Mg/M2 and nivolumab 360 Mg IV every 3 weeks for 3 cycles.  Last dose 09/24/21.  Status post 3 cycles  CURRENT THERAPY: Concurrent chemoradiation with carboplatin for an AUC of 2 and paclitaxel 45 mg per metered square.  First dose on 11/18/2021. Status post 6 cycles.   INTERVAL HISTORY: Paul [name] 76 y.o. male returns to the clinic today for a follow-up visit. The patient was last seen on 12/28. When he was seen at that time, his ANC was low and platelet count 85k. His chemotherapy was cancelled that week. He was  resumed his treatment on 1/4.   Overall, the patient is feeling fairly well today without any concerning complaints except radiation induced esophagitis which affects his weight and appetite. He takes carafate BID. He is scheduled to see Dr. Sondra Come later today. He is tolerating his chemotherapy portion of treatment well without any concerning adverse side effects. His last radiation is scheduled for 01/10/22. He denies any recent fever, chills, or night sweats. He estimates that he lost about 5 lbs since his last appointment.  He denies any hemoptysis. He states his breathing is stable. He sometimes has dyspnea on exertion depending on the activity. He has a mild cough for several weeks but states it is getting better. He reports clear phlegm.  He continues to smoke about 1/4 a pack of cigarettes daily which is an improvement for him compared to his baseline 2 packs of  cigarettes per day. He denies any nausea, vomiting, or diarrhea. He sometimes has mild constipation. He denies any headache or visual changes.  He is here today for evaluation and repeat blood work before starting cycle #7     MEDICAL HISTORY: Past Medical History:  Diagnosis Date   CHF (congestive heart failure) ([place])    Coronary artery disease    Diabetes mellitus without complication ([place])    Hypertension    Hypothyroidism    Ischemic cardiomyopathy    Pneumonia    a long time ago   Sleep apnea    no longer uses a cpap    ALLERGIES:  has No Known Allergies.  MEDICATIONS:  Current Outpatient Medications  Medication Sig Dispense Refill   aspirin 81 MG chewable tablet Take 81 mg by mouth daily.     blood glucose meter kit and supplies KIT Dispense based on patient and insurance preference. Use up to four times daily as directed. 1 each 0   carvedilol (COREG) 25 MG tablet Take 25 mg by mouth at bedtime.     folic acid (FOLVITE) 1 MG tablet Take 1 tablet (1 mg total) by mouth daily. 30 tablet 4   furosemide (LASIX) 20 MG tablet Take 20 mg by mouth at bedtime.     insulin glargine (LANTUS) 100 UNIT/ML Solostar Pen Inject 10 Units into the skin daily. 5 mL 5   levothyroxine (SYNTHROID) 25 MCG tablet Take 25 mcg by mouth at bedtime.     lidocaine-prilocaine (EMLA) cream Apply 1 application topically as needed. 30 g 2   Menthol-Methyl Salicylate (MUSCLE  RUB) 10-15 % CREA Apply 1 application topically daily as needed for muscle pain.     metFORMIN (GLUCOPHAGE) 500 MG tablet Take 1 tablet (500 mg total) by mouth 2 (two) times daily with a meal. 60 tablet 3   Multiple Vitamins-Minerals (MULTIVITAMIN WITH MINERALS) tablet Take 1 tablet by mouth daily.     nitroGLYCERIN (NITROSTAT) 0.4 MG SL tablet Place 0.4 mg under the tongue every 5 (five) minutes as needed for chest pain.     omeprazole (PRILOSEC) 20 MG capsule Take 20 mg by mouth daily. (Patient not taking: Reported on 11/20/2021)      prochlorperazine (COMPAZINE) 10 MG tablet Take 1 tablet (10 mg total) by mouth every 6 (six) hours as needed for nausea or vomiting. 30 tablet 0   sacubitril-valsartan (ENTRESTO) 49-51 MG Take 0.5 tablets by mouth 2 (two) times daily.     sucralfate (CARAFATE) 1 g tablet Take 1 tablet (1 g total) by mouth 4 (four) times daily -  with meals and at bedtime. Crush and dissolve in 10 mL of warm water prior to swallowing, take before meals 120 tablet 1   No current facility-administered medications for this visit.    SURGICAL HISTORY:  Past Surgical History:  Procedure Laterality Date   BRONCHIAL BIOPSY  06/04/2021   Procedure: BRONCHIAL BIOPSIES;  Surgeon: Garner Nash, DO;  Location: [facility];  Service: Pulmonary;;   BRONCHIAL BRUSHINGS  06/04/2021   Procedure: BRONCHIAL BRUSHINGS;  Surgeon: Garner Nash, DO;  Location: [facility];  Service: Pulmonary;;   BRONCHIAL NEEDLE ASPIRATION BIOPSY  06/04/2021   Procedure: BRONCHIAL NEEDLE ASPIRATION BIOPSIES;  Surgeon: Garner Nash, DO;  Location: [facility];  Service: Pulmonary;;   BRONCHIAL WASHINGS  06/04/2021   Procedure: BRONCHIAL WASHINGS;  Surgeon: Garner Nash, DO;  Location: [facility];  Service: Pulmonary;;   CARDIAC SURGERY     COLONOSCOPY     CORONARY ARTERY BYPASS GRAFT     2019 at [facility] Right 06/04/2021   Procedure: [place];  Surgeon: Garner Nash, DO;  Location: [facility];  Service: Pulmonary;  Laterality: Right;   VIDEO BRONCHOSCOPY WITH ENDOBRONCHIAL ULTRASOUND  06/04/2021   Procedure: VIDEO BRONCHOSCOPY WITH ENDOBRONCHIAL ULTRASOUND;  Surgeon: Garner Nash, DO;  Location: [facility];  Service: Pulmonary;;    REVIEW OF SYSTEMS:   Constitutional: Positive for 5 pound weight loss/appetite change and fatigue. Negative for  chills and fever HENT: Mild radiation-induced esophagitis.  Negative for mouth sores,  nosebleeds, and trouble swallowing.   Eyes: Negative for eye problems and icterus.  Respiratory: Positive for minor cough (improving) and occasional dyspnea with certain strenuous activities.  Negative for hemoptysis and wheezing.   Cardiovascular: Negative for chest pain and leg swelling.  Gastrointestinal: Positive for mild constipation. Negative for abdominal pain,  diarrhea, nausea and vomiting.  Genitourinary: Negative for bladder incontinence, difficulty urinating, dysuria, frequency and hematuria.   Musculoskeletal: Negative for back pain, gait problem, neck pain and neck stiffness.  Skin: Negative for itching and rash.  Neurological: Negative for dizziness, extremity weakness, gait problem, headaches, light-headedness and seizures.  Hematological: Negative for adenopathy. Does not bruise/bleed easily.  Psychiatric/Behavioral: Negative for confusion, depression and sleep disturbance. The patient is not nervous/anxious.      PHYSICAL EXAMINATION:  There were no vitals taken for this visit.  ECOG PERFORMANCE STATUS: 1  Physical Exam  Constitutional: Oriented to person, place, and time and well-developed, well-nourished, and  in no distress. No distress.  HENT:  Head: Normocephalic and atraumatic.  Mouth/Throat: Oropharynx is clear and moist. No oropharyngeal exudate.  Eyes: Conjunctivae are normal. Right eye exhibits no discharge. Left eye exhibits no discharge. No scleral icterus.  Neck: Normal range of motion. Neck supple.  Cardiovascular: Normal rate, regular rhythm, normal heart sounds and intact distal pulses.   Pulmonary/Chest: Effort normal and breath sounds normal. No respiratory distress. No wheezes. No rales.  Abdominal: Soft. Bowel sounds are normal. Exhibits no distension and no mass. There is no tenderness.  Musculoskeletal: Normal range of motion. Exhibits no edema.  Lymphadenopathy:    No cervical adenopathy.  Neurological: Alert and oriented to person, place, and  time. Exhibits normal muscle tone. Gait normal. Coordination normal.  Skin: Skin is warm and dry. No rash noted. Not diaphoretic. No erythema. No pallor.  Psychiatric: Mood, memory and judgment normal.  Vitals reviewed.  LABORATORY DATA: Lab Results  Component Value Date   WBC 2.6 (L) 01/07/2022   HGB 10.8 (L) 01/07/2022   HCT 31.9 (L) 01/07/2022   MCV 96.4 01/07/2022   PLT 151 01/07/2022      Chemistry      Component Value Date/Time   NA 139 01/01/2022 1357   K 4.5 01/01/2022 1357   CL 112 (H) 01/01/2022 1357   CO2 22 01/01/2022 1357   BUN 13 01/01/2022 1357   CREATININE 1.20 01/01/2022 1357      Component Value Date/Time   CALCIUM 8.2 (L) 01/01/2022 1357   ALKPHOS 110 01/01/2022 1357   AST 19 01/01/2022 1357   ALT 11 01/01/2022 1357   BILITOT 0.5 01/01/2022 1357       RADIOGRAPHIC STUDIES:  No results found.   ASSESSMENT/PLAN:  This is a very pleasant 76 year old African-American male diagnosed with stage IIIb (T3b, N2, M0) non-small cell lung cancer, adenocarcinoma.  He presented with a large right upper lobe lung mass and suspicious right lower lobe paratracheal lymphadenopathy.  He was diagnosed in June 2022.  His molecular studies by [facility] were negative for any actionable mutations.  The patient completed 3 cycles of neoadjuvant chemotherapy according to the Checkmate 816 trial with carboplatin for an AUC of 5, Alimta 500 mg per metered square, and nivolumab 360 mg IV every 3 weeks.  The patient status post 3 cycles.  His last dose was on 09/24/2021.  Upon reimaging after completion of this course, there was not significant improvement in the size of his mass and therefore, it does not appear that we are able to downstage his disease to make him eligible for surgical resection.  Therefore, Dr. Julien Nordmann recommended that the patient start concurrent chemoradiation with carboplatin for an AUC of 2 and paclitaxel 45 mg per metered squared.  The patient status post  6 cycles and he has been tolerating it well. However, he has developed some dysphagia due to radiation. His last day of radiation is this week on 01/10/22.   Labs were reviewed. His CBC is acceptable for treatment. His CMP is pending. As long as his CMP is within parameters, he can proceed with his last cycle of chemotherapy today with cycle #7.   His last day of radiation is on 01/10/22. He is scheduled to see Dr. Sondra Come today. Discussed with the patient to talk to radiation oncology about if they have any additional recommendations regarding his odynophagia/esophagitis from radiation.   I will arrange for a restaging CT scan in 3-4 weeks. We will see him back for  a follow up visit a few days later to review his scan results.   Advised he may use delsym or robitussin if needed for his baseline cough.    Strongly encouraged him to quit smoking as it will help his appetite and baseline cough.   The patient was advised to call immediately if she has any concerning symptoms in the interval. The patient voices understanding of current disease status and treatment options and is in agreement with the current care plan. All questions were answered. The patient knows to call the clinic with any problems, questions or concerns. We can certainly see the patient much sooner if necessary         Orders Placed This Encounter  Procedures   CT Chest W Contrast    Can hold IV contrast if creatinine is >1.5 day of scan.    Standing Status:   Future    Standing Expiration Date:   01/07/2023    Order Specific Question:   If indicated for the ordered procedure, I authorize the administration of contrast media per Radiology protocol    Answer:   Yes    Order Specific Question:   Preferred imaging location?    Answer:   [facility]   CBC with Differential ([place] Only)    Standing Status:   Future    Standing Expiration Date:   01/07/2023   CMP ([place] only)    Standing Status:    Future    Standing Expiration Date:   01/07/2023      The total time spent in the appointment was 20-29 minutes.   Warren Lindahl L Audry Pecina, PA-C 01/07/22

## 2022-01-06 ENCOUNTER — Other Ambulatory Visit: Payer: Self-pay

## 2022-01-06 ENCOUNTER — Ambulatory Visit
Admission: RE | Admit: 2022-01-06 | Discharge: 2022-01-06 | Disposition: A | Discharge status: Home / Self Care | Payer: No Typology Code available for payment source | Source: Ambulatory Visit | Attending: Radiation Oncology | Admitting: Radiation Oncology

## 2022-01-06 DIAGNOSIS — Z510 Encounter for antineoplastic radiation therapy: Secondary | ICD-10-CM | POA: Diagnosis not present

## 2022-01-07 ENCOUNTER — Inpatient Hospital Stay: Payer: No Typology Code available for payment source

## 2022-01-07 ENCOUNTER — Other Ambulatory Visit: Payer: Self-pay | Admitting: Physician Assistant

## 2022-01-07 ENCOUNTER — Ambulatory Visit
Admission: RE | Admit: 2022-01-07 | Discharge: 2022-01-07 | Disposition: A | Discharge status: Home / Self Care | Payer: No Typology Code available for payment source | Source: Ambulatory Visit | Attending: Radiation Oncology | Admitting: Radiation Oncology

## 2022-01-07 ENCOUNTER — Other Ambulatory Visit: Payer: Self-pay

## 2022-01-07 ENCOUNTER — Inpatient Hospital Stay (HOSPITAL_BASED_OUTPATIENT_CLINIC_OR_DEPARTMENT_OTHER): Payer: No Typology Code available for payment source | Admitting: Physician Assistant

## 2022-01-07 ENCOUNTER — Other Ambulatory Visit: Payer: Self-pay | Admitting: Radiation Oncology

## 2022-01-07 VITALS — BP 131/75 | HR 79 | Temp 97.8°F | Resp 20

## 2022-01-07 DIAGNOSIS — E119 Type 2 diabetes mellitus without complications: Secondary | ICD-10-CM | POA: Diagnosis not present

## 2022-01-07 DIAGNOSIS — C3491 Malignant neoplasm of unspecified part of right bronchus or lung: Secondary | ICD-10-CM

## 2022-01-07 DIAGNOSIS — C3431 Malignant neoplasm of lower lobe, right bronchus or lung: Secondary | ICD-10-CM

## 2022-01-07 DIAGNOSIS — I110 Hypertensive heart disease with heart failure: Secondary | ICD-10-CM

## 2022-01-07 DIAGNOSIS — Z5111 Encounter for antineoplastic chemotherapy: Secondary | ICD-10-CM

## 2022-01-07 DIAGNOSIS — Z510 Encounter for antineoplastic radiation therapy: Secondary | ICD-10-CM | POA: Diagnosis not present

## 2022-01-07 LAB — CMP (CANCER CENTER ONLY)
ALT: 10 U/L (ref 0–44)
AST: 17 U/L (ref 15–41)
Albumin: 3.4 g/dL — ABNORMAL LOW (ref 3.5–5.0)
Alkaline Phosphatase: 108 U/L (ref 38–126)
Anion gap: 6 (ref 5–15)
BUN: 18 mg/dL (ref 8–23)
CO2: 26 mmol/L (ref 22–32)
Calcium: 9.1 mg/dL (ref 8.9–10.3)
Chloride: 107 mmol/L (ref 98–111)
Creatinine: 1.11 mg/dL (ref 0.61–1.24)
GFR, Estimated: >60 mL/min (ref >60)
Glucose, Bld: 258 mg/dL — ABNORMAL HIGH (ref 70–99)
Potassium: 4.3 mmol/L (ref 3.5–5.1)
Sodium: 139 mmol/L (ref 135–145)
Total Bilirubin: 0.3 mg/dL (ref 0.3–1.2)
Total Protein: 6.1 g/dL — ABNORMAL LOW (ref 6.5–8.1)

## 2022-01-07 LAB — CBC WITH DIFFERENTIAL (CANCER CENTER ONLY)
Abs Immature Granulocytes: 0.02 10*3/uL (ref 0.00–0.07)
Basophils Absolute: 0 10*3/uL (ref 0.0–0.1)
Basophils Relative: 0 %
Eosinophils Absolute: 0 10*3/uL (ref 0.0–0.5)
Eosinophils Relative: 1 %
HCT: 31.9 % — ABNORMAL LOW (ref 39.0–52.0)
Hemoglobin: 10.8 g/dL — ABNORMAL LOW (ref 13.0–17.0)
Immature Granulocytes: 1 %
Lymphocytes Relative: 7 %
Lymphs Abs: 0.2 10*3/uL — ABNORMAL LOW (ref 0.7–4.0)
MCH: 32.6 pg (ref 26.0–34.0)
MCHC: 33.9 g/dL (ref 30.0–36.0)
MCV: 96.4 fL (ref 80.0–100.0)
Monocytes Absolute: 0.2 10*3/uL (ref 0.1–1.0)
Monocytes Relative: 9 %
Neutro Abs: 2.1 10*3/uL (ref 1.7–7.7)
Neutrophils Relative %: 82 %
Platelet Count: 151 10*3/uL (ref 150–400)
RBC: 3.31 MIL/uL — ABNORMAL LOW (ref 4.22–5.81)
RDW: 15.6 % — ABNORMAL HIGH (ref 11.5–15.5)
WBC Count: 2.6 10*3/uL — ABNORMAL LOW (ref 4.0–10.5)
nRBC: 0 % (ref 0.0–0.2)

## 2022-01-07 MED ORDER — HYDROCOD POLST-CPM POLST ER 10-8 MG/5ML PO SUER
5.0000 mL | Freq: Every evening | ORAL | 0 refills | Status: DC | PRN
Start: 2022-01-07 — End: 2022-03-11

## 2022-01-07 MED ORDER — SODIUM CHLORIDE 0.9 % IV SOLN
10.0000 mg | Freq: Once | INTRAVENOUS | Status: AC
  Administered 2022-01-07: 10 mg via INTRAVENOUS
  Filled 2022-01-07: qty 10

## 2022-01-07 MED ORDER — PALONOSETRON HCL INJECTION 0.25 MG/5ML
0.2500 mg | Freq: Once | INTRAVENOUS | Status: AC
  Administered 2022-01-07: 0.25 mg via INTRAVENOUS
  Filled 2022-01-07: qty 5

## 2022-01-07 MED ORDER — SODIUM CHLORIDE 0.9 % IV SOLN: Freq: Once | INTRAVENOUS | Status: AC

## 2022-01-07 MED ORDER — FAMOTIDINE 20 MG IN NS 100 ML IVPB
20.0000 mg | Freq: Once | INTRAVENOUS | Status: AC
  Administered 2022-01-07: 20 mg via INTRAVENOUS
  Filled 2022-01-07: qty 100

## 2022-01-07 MED ORDER — SODIUM CHLORIDE 0.9 % IV SOLN
45.0000 mg/m2 | Freq: Once | INTRAVENOUS | Status: AC
  Administered 2022-01-07: 102 mg via INTRAVENOUS
  Filled 2022-01-07: qty 17

## 2022-01-07 MED ORDER — SODIUM CHLORIDE 0.9 % IV SOLN
209.2000 mg | Freq: Once | INTRAVENOUS | Status: AC
  Administered 2022-01-07: 210 mg via INTRAVENOUS
  Filled 2022-01-07: qty 21

## 2022-01-07 MED ORDER — DIPHENHYDRAMINE HCL 50 MG/ML IJ SOLN
50.0000 mg | Freq: Once | INTRAMUSCULAR | Status: AC
  Administered 2022-01-07: 50 mg via INTRAVENOUS
  Filled 2022-01-07: qty 1

## 2022-01-07 NOTE — Patient Instructions (Signed)
[facility]  Discharge Instructions: Thank you for choosing [facility] to provide your oncology and hematology care.   If you have a lab appointment with the Cancer Center, please go directly to the Cancer Center and check in at the registration area.   Wear comfortable clothing and clothing appropriate for easy access to any Portacath or PICC line.   We strive to give you quality time with your provider. You may need to reschedule your appointment if you arrive late (15 or more minutes).  Arriving late affects you and other patients whose appointments are after yours.  Also, if you miss three or more appointments without notifying the office, you may be dismissed from the clinic at the provider's discretion.      For prescription refill requests, have your pharmacy contact our office and allow 72 hours for refills to be completed.    Today you received the following chemotherapy and/or immunotherapy agents: Paclitaxel (Taxol) and Carboplatin.   To help prevent nausea and vomiting after your treatment, we encourage you to take your nausea medication as directed.  BELOW ARE SYMPTOMS THAT SHOULD BE REPORTED IMMEDIATELY: *FEVER GREATER THAN 100.4 F (38 C) OR HIGHER *CHILLS OR SWEATING *NAUSEA AND VOMITING THAT IS NOT CONTROLLED WITH YOUR NAUSEA MEDICATION *UNUSUAL SHORTNESS OF BREATH *UNUSUAL BRUISING OR BLEEDING *URINARY PROBLEMS (pain or burning when urinating, or frequent urination) *BOWEL PROBLEMS (unusual diarrhea, constipation, pain near the anus) TENDERNESS IN MOUTH AND THROAT WITH OR WITHOUT PRESENCE OF ULCERS (sore throat, sores in mouth, or a toothache) UNUSUAL RASH, SWELLING OR PAIN  UNUSUAL VAGINAL DISCHARGE OR ITCHING   Items with * indicate a potential emergency and should be followed up as soon as possible or go to the Emergency Department if any problems should occur.  Please show the CHEMOTHERAPY ALERT CARD or IMMUNOTHERAPY  ALERT CARD at check-in to the Emergency Department and triage nurse.  Should you have questions after your visit or need to cancel or reschedule your appointment, please contact [facility]  Dept: [phone]  and follow the prompts.  Office hours are 8:00 a.m. to 4:30 p.m. Monday - Friday. Please note that voicemails left after 4:00 p.m. may not be returned until the following business day.  We are closed weekends and major holidays. You have access to a nurse at all times for urgent questions. Please call the main number to the clinic Dept: [phone] and follow the prompts.   For any non-urgent questions, you may also contact your provider using MyChart. We now offer e-Visits for anyone 18 and older to request care online for non-urgent symptoms. For details visit [email or web address].   Also download the MyChart app! Go to the app store, search "MyChart", open the app, select [facility], and log in with your MyChart username and password.  Due to Covid, a mask is required upon entering the hospital/clinic. If you do not have a mask, one will be given to you upon arrival. For doctor visits, patients may have 1 support person aged 18 or older with them. For treatment visits, patients cannot have anyone with them due to current Covid guidelines and our immunocompromised population.   

## 2022-01-08 ENCOUNTER — Other Ambulatory Visit: Payer: Self-pay

## 2022-01-08 ENCOUNTER — Telehealth: Payer: Self-pay | Admitting: Internal Medicine

## 2022-01-08 ENCOUNTER — Ambulatory Visit
Admission: RE | Admit: 2022-01-08 | Discharge: 2022-01-08 | Disposition: A | Discharge status: Home / Self Care | Payer: No Typology Code available for payment source | Source: Ambulatory Visit | Attending: Radiation Oncology | Admitting: Radiation Oncology

## 2022-01-08 DIAGNOSIS — Z510 Encounter for antineoplastic radiation therapy: Secondary | ICD-10-CM | POA: Diagnosis not present

## 2022-01-08 NOTE — Telephone Encounter (Signed)
Scheduled appt per 1/10 los - mailed letter with appt date and time. Central radiology to contact patient with appt date and time

## 2022-01-09 ENCOUNTER — Ambulatory Visit: Payer: No Typology Code available for payment source

## 2022-01-09 ENCOUNTER — Ambulatory Visit
Admission: RE | Admit: 2022-01-09 | Discharge: 2022-01-09 | Disposition: A | Discharge status: Home / Self Care | Payer: No Typology Code available for payment source | Source: Ambulatory Visit | Attending: Radiation Oncology | Admitting: Radiation Oncology

## 2022-01-09 DIAGNOSIS — Z510 Encounter for antineoplastic radiation therapy: Secondary | ICD-10-CM | POA: Diagnosis not present

## 2022-01-10 ENCOUNTER — Ambulatory Visit
Admission: RE | Admit: 2022-01-10 | Discharge: 2022-01-10 | Disposition: A | Discharge status: Home / Self Care | Payer: No Typology Code available for payment source | Source: Ambulatory Visit | Attending: Radiation Oncology | Admitting: Radiation Oncology

## 2022-01-10 ENCOUNTER — Other Ambulatory Visit: Payer: Self-pay

## 2022-01-10 ENCOUNTER — Encounter: Payer: Self-pay | Admitting: Radiation Oncology

## 2022-01-10 DIAGNOSIS — Z510 Encounter for antineoplastic radiation therapy: Secondary | ICD-10-CM | POA: Diagnosis not present

## 2022-01-22 ENCOUNTER — Encounter: Payer: Self-pay | Admitting: Internal Medicine

## 2022-01-31 ENCOUNTER — Inpatient Hospital Stay: Payer: No Typology Code available for payment source | Attending: Internal Medicine

## 2022-01-31 ENCOUNTER — Encounter: Payer: Self-pay | Admitting: Internal Medicine

## 2022-01-31 ENCOUNTER — Ambulatory Visit (HOSPITAL_COMMUNITY)
Admission: RE | Admit: 2022-01-31 | Discharge: 2022-01-31 | Disposition: A | Discharge status: Home / Self Care | Payer: No Typology Code available for payment source | Source: Ambulatory Visit | Attending: Physician Assistant | Admitting: Physician Assistant

## 2022-01-31 ENCOUNTER — Other Ambulatory Visit: Payer: Self-pay

## 2022-01-31 DIAGNOSIS — C3491 Malignant neoplasm of unspecified part of right bronchus or lung: Secondary | ICD-10-CM

## 2022-01-31 DIAGNOSIS — E119 Type 2 diabetes mellitus without complications: Secondary | ICD-10-CM | POA: Insufficient documentation

## 2022-01-31 DIAGNOSIS — Z5111 Encounter for antineoplastic chemotherapy: Secondary | ICD-10-CM | POA: Diagnosis present

## 2022-01-31 DIAGNOSIS — C3431 Malignant neoplasm of lower lobe, right bronchus or lung: Secondary | ICD-10-CM | POA: Insufficient documentation

## 2022-01-31 DIAGNOSIS — I10 Essential (primary) hypertension: Secondary | ICD-10-CM | POA: Insufficient documentation

## 2022-01-31 DIAGNOSIS — Z79899 Other long term (current) drug therapy: Secondary | ICD-10-CM | POA: Insufficient documentation

## 2022-01-31 DIAGNOSIS — Z5112 Encounter for antineoplastic immunotherapy: Secondary | ICD-10-CM | POA: Diagnosis not present

## 2022-01-31 LAB — CMP (CANCER CENTER ONLY)
ALT: 13 U/L (ref 0–44)
AST: 20 U/L (ref 15–41)
Albumin: 3.7 g/dL (ref 3.5–5.0)
Alkaline Phosphatase: 105 U/L (ref 38–126)
Anion gap: 6 (ref 5–15)
BUN: 16 mg/dL (ref 8–23)
CO2: 27 mmol/L (ref 22–32)
Calcium: 9 mg/dL (ref 8.9–10.3)
Chloride: 106 mmol/L (ref 98–111)
Creatinine: 1.29 mg/dL — ABNORMAL HIGH (ref 0.61–1.24)
GFR, Estimated: 57 mL/min — ABNORMAL LOW (ref >60)
Glucose, Bld: 308 mg/dL — ABNORMAL HIGH (ref 70–99)
Potassium: 4.4 mmol/L (ref 3.5–5.1)
Sodium: 139 mmol/L (ref 135–145)
Total Bilirubin: 0.4 mg/dL (ref 0.3–1.2)
Total Protein: 6.9 g/dL (ref 6.5–8.1)

## 2022-01-31 LAB — CBC WITH DIFFERENTIAL (CANCER CENTER ONLY)
Abs Immature Granulocytes: 0.02 10*3/uL (ref 0.00–0.07)
Basophils Absolute: 0.1 10*3/uL (ref 0.0–0.1)
Basophils Relative: 1 %
Eosinophils Absolute: 0.1 10*3/uL (ref 0.0–0.5)
Eosinophils Relative: 2 %
HCT: 32.9 % — ABNORMAL LOW (ref 39.0–52.0)
Hemoglobin: 10.6 g/dL — ABNORMAL LOW (ref 13.0–17.0)
Immature Granulocytes: 0 %
Lymphocytes Relative: 27 %
Lymphs Abs: 1.4 10*3/uL (ref 0.7–4.0)
MCH: 32.6 pg (ref 26.0–34.0)
MCHC: 32.2 g/dL (ref 30.0–36.0)
MCV: 101.2 fL — ABNORMAL HIGH (ref 80.0–100.0)
Monocytes Absolute: 1 10*3/uL (ref 0.1–1.0)
Monocytes Relative: 19 %
Neutro Abs: 2.7 10*3/uL (ref 1.7–7.7)
Neutrophils Relative %: 51 %
Platelet Count: 117 10*3/uL — ABNORMAL LOW (ref 150–400)
RBC: 3.25 MIL/uL — ABNORMAL LOW (ref 4.22–5.81)
RDW: 18.3 % — ABNORMAL HIGH (ref 11.5–15.5)
WBC Count: 5.3 10*3/uL (ref 4.0–10.5)
nRBC: 0.8 % — ABNORMAL HIGH (ref 0.0–0.2)

## 2022-01-31 MED ORDER — SODIUM CHLORIDE (PF) 0.9 % IJ SOLN
INTRAMUSCULAR | Status: AC
  Filled 2022-01-31: qty 50

## 2022-01-31 MED ORDER — IOHEXOL 300 MG/ML  SOLN
75.0000 mL | Freq: Once | INTRAMUSCULAR | Status: AC | PRN
  Administered 2022-01-31: 75 mL via INTRAVENOUS

## 2022-02-03 ENCOUNTER — Other Ambulatory Visit: Payer: Self-pay | Admitting: Physician Assistant

## 2022-02-03 ENCOUNTER — Inpatient Hospital Stay: Payer: No Typology Code available for payment source | Admitting: Internal Medicine

## 2022-02-03 ENCOUNTER — Encounter: Payer: Self-pay | Admitting: Radiation Oncology

## 2022-02-03 ENCOUNTER — Telehealth: Payer: Self-pay | Admitting: Internal Medicine

## 2022-02-03 NOTE — Telephone Encounter (Signed)
Sch per 2/6 inbasket, left msg with pt ,stated appt time is earlier

## 2022-02-05 ENCOUNTER — Inpatient Hospital Stay (HOSPITAL_BASED_OUTPATIENT_CLINIC_OR_DEPARTMENT_OTHER): Payer: No Typology Code available for payment source | Admitting: Internal Medicine

## 2022-02-05 ENCOUNTER — Other Ambulatory Visit: Payer: Self-pay

## 2022-02-05 VITALS — BP 152/89 | HR 99 | Temp 96.4°F | Resp 19 | Ht 68.0 in | Wt 247.3 lb

## 2022-02-05 DIAGNOSIS — C3431 Malignant neoplasm of lower lobe, right bronchus or lung: Secondary | ICD-10-CM | POA: Diagnosis not present

## 2022-02-05 DIAGNOSIS — R591 Generalized enlarged lymph nodes: Secondary | ICD-10-CM

## 2022-02-05 DIAGNOSIS — Z5112 Encounter for antineoplastic immunotherapy: Secondary | ICD-10-CM

## 2022-02-05 DIAGNOSIS — C3491 Malignant neoplasm of unspecified part of right bronchus or lung: Secondary | ICD-10-CM

## 2022-02-05 NOTE — Progress Notes (Signed)
DISCONTINUE OFF PATHWAY REGIMEN - Other   [id number]:Carboplatin AUC=2 + Paclitaxel 45 mg/m2 Weekly:   Administer weekly:     Paclitaxel      Carboplatin   **Always confirm dose/schedule in your pharmacy ordering system**  REASON: Other Reason PRIOR TREATMENT: Carboplatin AUC=2 + Paclitaxel 45 mg/m2 Weekly TREATMENT RESPONSE: Stable Disease (SD)  START OFF PATHWAY REGIMEN - Other   [id number]:Durvalumab 1,500 mg IV D1 q28 Days:   A cycle is every 28 days:     Durvalumab   **Always confirm dose/schedule in your pharmacy ordering system**  Patient Characteristics: Intent of Therapy: Curative Intent, Discussed with Patient

## 2022-02-05 NOTE — Progress Notes (Signed)
[place] [phone]   Fax:([phone]) [address] [place] [address]  DIAGNOSIS: Stage IIIB (T3b, N2, M0) non-small cell lung cancer, adenocarcinoma presented with large right lower lobe lung mass and suspicious right lower paratracheal lymphadenopathy diagnosed in June 2022.  Molecular studies by Guardant 360 showed no actionable mutations  PRIOR THERAPY:  1) Neoadjuvant systemic chemotherapy according to the Checkmate 816 with carboplatin for AUC of 5, Alimta 500 Mg/M2 and nivolumab 360 Mg IV every 3 weeks for 3 cycles.  Last dose 09/24/21.  Status post 3 cycles.  2) Concurrent chemoradiation with carboplatin for an AUC of 2 and paclitaxel 45 mg per metered square.  First dose expected on 11/04/2021.  Status post 7 cycles.   CURRENT THERAPY: Consolidation treatment with immunotherapy with Imfinzi 1500 Mg IV every 4 weeks.  First dose February 12, 2022.  INTERVAL HISTORY: Paul [name] 76 y.o. male returns to the clinic today for follow-up visit.  The patient is feeling fine today with no concerning complaints except for cough productive of whitish sputum.  He tolerated the previous course of concurrent chemoradiation fairly well except for odynophagia and the cough.  He also has shortness of breath with exertion.  He has no chest pain or hemoptysis.  He has no current nausea, vomiting, diarrhea or constipation.  He has no headache or visual changes.  He denied having any recent weight loss or night sweats.  The patient had repeat CT scan of the chest performed recently and he is here for evaluation and discussion of his scan results.  MEDICAL HISTORY: Past Medical History:  Diagnosis Date   CHF (congestive heart failure) ([place])    Coronary artery disease    Diabetes mellitus without complication ([place])    History of radiation therapy    Right lung- 11/27/21-01/10/22- Dr. [name]   Hypertension    Hypothyroidism    Ischemic cardiomyopathy    Pneumonia    a long time ago   Sleep apnea    no longer uses a cpap    ALLERGIES:  has No Known Allergies.  MEDICATIONS:  Current Outpatient Medications  Medication Sig Dispense Refill   aspirin 81 MG chewable tablet Take 81 mg by mouth daily.     blood glucose meter kit and supplies KIT Dispense based on patient and insurance preference. Use up to four times daily as directed. 1 each 0   carvedilol (COREG) 25 MG tablet Take 25 mg by mouth at bedtime.     chlorpheniramine-HYDROcodone (TUSSIONEX) 10-8 MG/5ML SUER Take 5 mLs by mouth at bedtime as needed for cough. 70 mL 0   folic acid (FOLVITE) 1 MG tablet Take 1 tablet (1 mg total) by mouth daily. 30 tablet 4   furosemide (LASIX) 20 MG tablet Take 20 mg by mouth at bedtime.     insulin glargine (LANTUS) 100 UNIT/ML Solostar Pen Inject 10 Units into the skin daily. 5 mL 5   levothyroxine (SYNTHROID) 25 MCG tablet Take 25 mcg by mouth at bedtime.     lidocaine-prilocaine (EMLA) cream Apply 1 application topically as needed. 30 g 2   Menthol-Methyl Salicylate (MUSCLE RUB) 10-15 % CREA Apply 1 application topically daily as needed for muscle pain.     metFORMIN (GLUCOPHAGE) 500 MG tablet Take 1 tablet (500 mg total) by mouth 2 (two) times daily with a meal. 60 tablet 3   Multiple Vitamins-Minerals (MULTIVITAMIN  WITH MINERALS) tablet Take 1 tablet by mouth daily.     nitroGLYCERIN (NITROSTAT) 0.4 MG SL tablet Place 0.4 mg under the tongue every 5 (five) minutes as needed for chest pain.     omeprazole (PRILOSEC) 20 MG capsule Take 20 mg by mouth daily. (Patient not taking: Reported on 11/20/2021)     prochlorperazine (COMPAZINE) 10 MG tablet Take 1 tablet (10 mg total) by mouth every 6 (six) hours as needed for nausea or vomiting. 30 tablet 0   sacubitril-valsartan (ENTRESTO) 49-51 MG Take 0.5 tablets by mouth 2 (two) times daily.     sucralfate (CARAFATE) 1 g tablet Take  1 tablet (1 g total) by mouth 4 (four) times daily -  with meals and at bedtime. Crush and dissolve in 10 mL of warm water prior to swallowing, take before meals 120 tablet 1   No current facility-administered medications for this visit.    SURGICAL HISTORY:  Past Surgical History:  Procedure Laterality Date   BRONCHIAL BIOPSY  06/04/2021   Procedure: BRONCHIAL BIOPSIES;  Surgeon: Garner Nash, DO;  Location: [facility];  Service: Pulmonary;;   BRONCHIAL BRUSHINGS  06/04/2021   Procedure: BRONCHIAL BRUSHINGS;  Surgeon: Garner Nash, DO;  Location: [facility];  Service: Pulmonary;;   BRONCHIAL NEEDLE ASPIRATION BIOPSY  06/04/2021   Procedure: BRONCHIAL NEEDLE ASPIRATION BIOPSIES;  Surgeon: Garner Nash, DO;  Location: [facility];  Service: Pulmonary;;   BRONCHIAL WASHINGS  06/04/2021   Procedure: BRONCHIAL WASHINGS;  Surgeon: Garner Nash, DO;  Location: [facility];  Service: Pulmonary;;   CARDIAC SURGERY     COLONOSCOPY     CORONARY ARTERY BYPASS GRAFT     2019 at [facility] Right 06/04/2021   Procedure: Landa;  Surgeon: Garner Nash, DO;  Location: [facility];  Service: Pulmonary;  Laterality: Right;   VIDEO BRONCHOSCOPY WITH ENDOBRONCHIAL ULTRASOUND  06/04/2021   Procedure: VIDEO BRONCHOSCOPY WITH ENDOBRONCHIAL ULTRASOUND;  Surgeon: Garner Nash, DO;  Location: [facility];  Service: Pulmonary;;    REVIEW OF SYSTEMS:  Constitutional: positive for fatigue Eyes: negative Ears, nose, mouth, throat, and face: negative Respiratory: positive for cough Cardiovascular: negative Gastrointestinal: positive for odynophagia Genitourinary:negative Integument/breast: negative Hematologic/lymphatic: negative Musculoskeletal:negative Neurological: negative Behavioral/Psych: negative Endocrine: negative Allergic/Immunologic: negative   PHYSICAL EXAMINATION: General  appearance: alert, cooperative, fatigued, and no distress Head: Normocephalic, without obvious abnormality, atraumatic Neck: no adenopathy, no JVD, supple, symmetrical, trachea midline, and thyroid not enlarged, symmetric, no tenderness/mass/nodules Lymph nodes: Cervical, supraclavicular, and axillary nodes normal. Resp: clear to auscultation bilaterally Back: symmetric, no curvature. ROM normal. No CVA tenderness. Cardio: regular rate and rhythm, S1, S2 normal, no murmur, click, rub or gallop GI: soft, non-tender; bowel sounds normal; no masses,  no organomegaly Extremities: extremities normal, atraumatic, no cyanosis or edema Neurologic: Alert and oriented X 3, normal strength and tone. Normal symmetric reflexes. Normal coordination and gait  ECOG PERFORMANCE STATUS: 1 - Symptomatic but completely ambulatory  Blood pressure (!) 152/89, pulse 99, temperature (!) 96.4 F (35.8 C), temperature source Tympanic, resp. rate 19, height 5' 8"  (1.727 m), weight 247 lb 4.8 oz (112.2 kg), SpO2 96 %.  LABORATORY DATA: Lab Results  Component Value Date   WBC 5.3 01/31/2022   HGB 10.6 (L) 01/31/2022   HCT 32.9 (L) 01/31/2022   MCV 101.2 (H) 01/31/2022   PLT 117 (L) 01/31/2022      Chemistry  Component Value Date/Time   NA 139 01/31/2022 1321   K 4.4 01/31/2022 1321   CL 106 01/31/2022 1321   CO2 27 01/31/2022 1321   BUN 16 01/31/2022 1321   CREATININE 1.29 (H) 01/31/2022 1321      Component Value Date/Time   CALCIUM 9.0 01/31/2022 1321   ALKPHOS 105 01/31/2022 1321   AST 20 01/31/2022 1321   ALT 13 01/31/2022 1321   BILITOT 0.4 01/31/2022 1321       RADIOGRAPHIC STUDIES: CT Chest W Contrast  Result Date: 02/03/2022 CLINICAL DATA:  76 year old male with history of non-small cell lung cancer (adenocarcinoma) with history of right lower lobe lung mass and right paratracheal lymphadenopathy diagnosed in June 2022. Follow-up study. EXAM: CT CHEST WITH CONTRAST TECHNIQUE:  Multidetector CT imaging of the chest was performed during intravenous contrast administration. RADIATION DOSE REDUCTION: This exam was performed according to the departmental dose-optimization program which includes automated exposure control, adjustment of the mA and/or kV according to patient size and/or use of iterative reconstruction technique. CONTRAST:  52m OMNIPAQUE IOHEXOL 300 MG/ML  SOLN COMPARISON:  Chest CT 10/21/2021. FINDINGS: Cardiovascular: Heart size is normal. There is no significant pericardial fluid, thickening or pericardial calcification. There is aortic atherosclerosis, as well as atherosclerosis of the great vessels of the mediastinum and the coronary arteries, including calcified atherosclerotic plaque in the left main, left anterior descending, left circumflex and right coronary arteries. Status post median sternotomy for CABG including LIMA to the LAD. Calcifications of the aortic valve. Mediastinum/Nodes: No pathologically enlarged mediastinal or hilar lymph nodes. Esophagus is unremarkable in appearance. No axillary lymphadenopathy. Lungs/Pleura: Previously noted right lower lobe mass is similar to prior examinations (axial image 88 of series 7) measuring 5.5 x 3.9 cm on today's examination. Adjacent to this in the posterior aspect of the right upper lobe (axial image 84 of series 7) there is an aggressive appearing 3.3 x 1.2 cm mass-like area of architectural distortion which has progressively become more apparent over prior examinations, concerning for slow-growing neoplasm such as an adenocarcinoma. In addition, there is a persistent mass-like area in the left lower lobe (axial image 104 of series 7) measuring 5.4 x 3.5 cm, previously hypermetabolic on prior PET-CT 025/36/6440 concerning for neoplasm. Small right pleural effusion again noted. No left pleural effusion. Mild diffuse bronchial wall thickening with very mild centrilobular and paraseptal emphysema. Upper Abdomen: Aortic  atherosclerosis. Musculoskeletal: Median sternotomy wires. There are no aggressive appearing lytic or blastic lesions noted in the visualized portions of the skeleton. IMPRESSION: 1. Multiple mass-like areas in the lungs bilaterally. The right lower lobe lesion is very similar to prior examinations, while the right upper and left lower lobe lesions both appear slightly more prominent than prior examinations, as detailed above. 2. Aortic atherosclerosis, in addition to left main and three-vessel coronary artery disease. Status post median sternotomy for CABG including LIMA to the LAD. 3. Mild diffuse bronchial wall thickening with very mild centrilobular and paraseptal emphysema; imaging findings suggestive of underlying COPD. Aortic Atherosclerosis (ICD10-I70.0) and Emphysema (ICD10-J43.9). Electronically Signed   By: DVinnie LangtonM.D.   On: 02/03/2022 10:05    ASSESSMENT AND PLAN: This is a very pleasant 76years old African-American male diagnosed with a stage IIIB (T3b, N2, M0) non-small cell lung cancer, adenocarcinoma presented with large right lower lobe lung mass and suspicious right lower lobe paratracheal lymphadenopathy diagnosed in June 2022. Has MRI of the brain is still pending. Molecular studies by Guardant 360 showed no actionable  mutations. The patient underwent neoadjuvant chemotherapy according to the Checkmate 816 with carboplatin for AUC of 5, Alimta 500 Mg/M2 and nivolumab 360 Mg IV every 3 weeks.  Status post 3 cycles. The patient continues to tolerate his treatment well with no concerning adverse effects.  Repeat imaging studies after the neoadjuvant treatment showed stable disease and the patient was not a good candidate for surgical resection. He is currently undergoing a course of concurrent chemoradiation with weekly carboplatin for AUC of 2 and paclitaxel 45 Mg/M2 status post 7 cycles.  Last cycle was given January 02, 2022.   The patient tolerated the previous course of  concurrent chemoradiation fairly well except for odynophagia and cough. He had repeat CT scan of the chest performed recently.  I personally and independently reviewed the scan images and discussed the results with the patient today. His scan showed stable disease with no significant evidence of disease progression. I discussed with the patient the option of consolidation treatment with immunotherapy with Imfinzi 1500 Mg IV every 4 weeks for a total of 1 year if he has no evidence for disease progression or unacceptable toxicity. The patient is interested in proceeding with this treatment. I discussed with him the adverse effect of this treatment including but not limited to immunotherapy mediated skin rash, diarrhea, inflammation of the lung, kidney, liver, thyroid or other endocrine dysfunction. The patient is expected to start the first cycle of this treatment next week. He will come back for follow-up visit in 5 weeks for evaluation with the start of cycle #2. He was advised to call immediately if he has any other concerning symptoms in the interval. The patient voices understanding of current disease status and treatment options and is in agreement with the current care plan.  All questions were answered. The patient knows to call the clinic with any problems, questions or concerns. We can certainly see the patient much sooner if necessary.   Disclaimer: This note was dictated with voice recognition software. Similar sounding words can inadvertently be transcribed and may not be corrected upon review.

## 2022-02-07 ENCOUNTER — Telehealth: Payer: Self-pay | Admitting: Physician Assistant

## 2022-02-07 NOTE — Telephone Encounter (Signed)
Scheduled per 02/08 los, patient has been called and notified of upcoming appointments.

## 2022-02-08 NOTE — Progress Notes (Incomplete)
°  Radiation Oncology         [phone] ________________________________  Patient Name: Paul [name] MRN: [id number] DOB: August 18, 1946 Referring Physician: Curt Bears (Profile Not Attached) Date of Service: 01/10/2022  [facility] [place]                                                        End Of Treatment Note  Diagnoses: C34.31-Malignant neoplasm of lower lobe, right bronchus or lung  Cancer Staging: Stage III-B Adenocarcinoma of the RLL, (cT3, cN2, cM0)    Intent: Curative  Radiation Treatment Dates: 11/27/2021 through 01/10/2022 Site Technique Total Dose (Gy) Dose per Fx (Gy) Completed Fx Beam Energies  Lung, Right: Lung_Rt 3D 60/60 2 30/30 6X, 10X   Narrative: The patient tolerated radiation therapy relatively well. He reports pain with swallowing, fatigue, hyperpigmentation to right chest, SOB with activity, productive cough with white phlegm, and difficulty swallowing.  Plan: The patient will follow-up with radiation oncology in one month. He is having significant esophageal symptoms but does not appear to be taking his Carafate as recommended.  We reviewed and discussed how to take this medication again.  Patient has had significant coughing at night which is keeping him awake and does wake him up.  In light of this we will give him a long-acting medicine i.e. Tussionex with a limited prescription to use in light of his sleeping difficulties related to pain.   ________________________________________________ -----------------------------------  Blair Promise, PhD, MD  This document serves as a record of services personally performed by [name], MD. It was created on his behalf by Roney Mans, a trained medical scribe. The creation of this record is based on the scribe's personal observations and the provider's statements to them. This document has been checked and approved by the attending provider.

## 2022-02-10 ENCOUNTER — Encounter: Payer: Self-pay | Admitting: Radiation Oncology

## 2022-02-10 ENCOUNTER — Ambulatory Visit
Admission: RE | Admit: 2022-02-10 | Discharge: 2022-02-10 | Disposition: A | Discharge status: Home / Self Care | Payer: No Typology Code available for payment source | Source: Ambulatory Visit | Attending: Radiation Oncology | Admitting: Radiation Oncology

## 2022-02-10 ENCOUNTER — Other Ambulatory Visit: Payer: Self-pay

## 2022-02-10 ENCOUNTER — Encounter: Payer: Self-pay | Admitting: Internal Medicine

## 2022-02-10 DIAGNOSIS — J439 Emphysema, unspecified: Secondary | ICD-10-CM | POA: Diagnosis not present

## 2022-02-10 DIAGNOSIS — Z923 Personal history of irradiation: Secondary | ICD-10-CM | POA: Diagnosis not present

## 2022-02-10 DIAGNOSIS — I700 Atherosclerosis of aorta: Secondary | ICD-10-CM | POA: Insufficient documentation

## 2022-02-10 DIAGNOSIS — Z7982 Long term (current) use of aspirin: Secondary | ICD-10-CM | POA: Diagnosis not present

## 2022-02-10 DIAGNOSIS — C3431 Malignant neoplasm of lower lobe, right bronchus or lung: Secondary | ICD-10-CM | POA: Diagnosis present

## 2022-02-10 DIAGNOSIS — Z9221 Personal history of antineoplastic chemotherapy: Secondary | ICD-10-CM | POA: Diagnosis not present

## 2022-02-10 DIAGNOSIS — C3491 Malignant neoplasm of unspecified part of right bronchus or lung: Secondary | ICD-10-CM

## 2022-02-10 DIAGNOSIS — Z79899 Other long term (current) drug therapy: Secondary | ICD-10-CM | POA: Insufficient documentation

## 2022-02-10 DIAGNOSIS — I2510 Atherosclerotic heart disease of native coronary artery without angina pectoris: Secondary | ICD-10-CM | POA: Diagnosis not present

## 2022-02-10 DIAGNOSIS — J90 Pleural effusion, not elsewhere classified: Secondary | ICD-10-CM | POA: Insufficient documentation

## 2022-02-10 HISTORY — DX: Personal history of irradiation: Z92.3

## 2022-02-10 NOTE — Progress Notes (Signed)
Paul [name] is here today for follow up post radiation to the lung.  Lung Side: Right, patient completed treatment on 01/10/22.  Does the patient complain of any of the following: Pain: Patient denies pain. Shortness of breath w/wo exertion:  Yes, on exertion.  Cough: Productive cough Hemoptysis: no Pain with swallowing: no, patient reports improvement with swallowing. Swallowing/choking concerns: no Appetite: Good Energy Level: low energy level Post radiation skin Changes: no    Additional comments if applicable:  Vitals:   99/87/21 1621  BP: (!) 147/89  Pulse: 92  Resp: 18  Temp: (!) 97.1 F (36.2 C)  TempSrc: Temporal  SpO2: 99%  Weight: 247 lb 8 oz (112.3 kg)  Height: 5\' 8"  (1.727 m)

## 2022-02-10 NOTE — Progress Notes (Signed)
Radiation Oncology         [phone] ________________________________  Name: Paul [name] MRN: [id number]  Date: 02/10/2022  DOB: 21-Sep-1946  Follow-Up Visit Note  CC: Clinic, Andria Rhein, MD    ICD-10-CM   1. Adenocarcinoma of right lung, stage 3 (HCC)  C34.91       Diagnosis: The encounter diagnosis was Adenocarcinoma of right lung, stage 3 ([place]).   Stage III-B Adenocarcinoma of the RLL, (cT3, cN2, cM0)  Interval Since Last Radiation: 1 month  Intent: Curative  Radiation Treatment Dates: 11/27/2021 through 01/10/2022 Site Technique Total Dose (Gy) Dose per Fx (Gy) Completed Fx Beam Energies  Lung, Right: Lung_Rt 3D 60/60 2 30/30 6X, 10X    Narrative:  The patient returns today for routine follow-up. The patient tolerated radiation therapy relatively well other than pain with swallowing, fatigue, hyperpigmentation to the right chest, SOB with activity, productive cough with white phlegm, and difficulty swallowing. Given his significant esophageal symptoms and pain with coughing which kept him up at night, I prescribed him Tussionex in light of his sleeping difficulties related to pain.  Since his initital consultation date this past November, the patient has maintained follow up with Dr. Julien Nordmann in regards to his systemic treatment. Per Dr . 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 His esophagitis seemed to affect his weight and appetite as well. During his most recent follow up with Dr. Julien Nordmann on 02/05/22, the patent denied any complaints other than cough productive of whitish sputum.   Pertinent imaging since the patient was last seen for consultation includes:  --Chest CT on 01/31/22 which demonstrated multiple mass-like areas in the lungs bilaterally. The right lower lobe lesion was noted to appear very similar to prior examinations, while the right upper and left lower lobe lesions  both appeared slightly more prominent than prior examinations. Specifically, a 3.3 x 1.2 cm mass like area of distortion in the right upper lobe appeared concerning for slow-growing neoplasm such as adenocarcinoma, and a persistent mass like area in the left lower lobe measuring 5.4 x 3.5 cm, previously hypermetabolic on CT from 00/37/04, and also concerning for neoplasm. CT also showed a small right pleural effusion and mild diffuse bronchial wall thickening, with very mild centrilobular and paraseptal emphysema suggestive of underlying COPD.         He reports his swallowing has significantly improved.  He denies any discomfort at this time with swallowing and can eat pretty much any food he likes.  Continues to have a mild cough.  He denies any hemoptysis.  He denies any pain within the chest area.  He has some dyspnea on exertion.                      Allergies:  has No Known Allergies.  Meds: Current Outpatient Medications  Medication Sig Dispense Refill   aspirin 81 MG chewable tablet Take 81 mg by mouth daily.     blood glucose meter kit and supplies KIT Dispense based on patient and insurance preference. Use up to four times daily as directed. 1 each 0   carvedilol (COREG) 25 MG tablet Take 25 mg by mouth at bedtime.     chlorpheniramine-HYDROcodone (TUSSIONEX) 10-8 MG/5ML SUER Take 5 mLs by mouth at bedtime as needed for cough. 70 mL 0   folic acid (FOLVITE) 1 MG tablet Take 1 tablet (1 mg total) by mouth daily. 30 tablet 4  furosemide (LASIX) 20 MG tablet Take 20 mg by mouth at bedtime.     insulin glargine (LANTUS) 100 UNIT/ML Solostar Pen Inject 10 Units into the skin daily. 5 mL 5   levothyroxine (SYNTHROID) 25 MCG tablet Take 25 mcg by mouth at bedtime.     lidocaine-prilocaine (EMLA) cream Apply 1 application topically as needed. 30 g 2   Menthol-Methyl Salicylate (MUSCLE RUB) 10-15 % CREA Apply 1 application topically daily as needed for muscle pain.     metFORMIN (GLUCOPHAGE) 500  MG tablet Take 1 tablet (500 mg total) by mouth 2 (two) times daily with a meal. 60 tablet 3   Multiple Vitamins-Minerals (MULTIVITAMIN WITH MINERALS) tablet Take 1 tablet by mouth daily.     nitroGLYCERIN (NITROSTAT) 0.4 MG SL tablet Place 0.4 mg under the tongue every 5 (five) minutes as needed for chest pain.     omeprazole (PRILOSEC) 20 MG capsule Take 20 mg by mouth daily. (Patient not taking: Reported on 11/20/2021)     prochlorperazine (COMPAZINE) 10 MG tablet Take 1 tablet (10 mg total) by mouth every 6 (six) hours as needed for nausea or vomiting. 30 tablet 0   sacubitril-valsartan (ENTRESTO) 49-51 MG Take 0.5 tablets by mouth 2 (two) times daily.     sucralfate (CARAFATE) 1 g tablet Take 1 tablet (1 g total) by mouth 4 (four) times daily -  with meals and at bedtime. Crush and dissolve in 10 mL of warm water prior to swallowing, take before meals 120 tablet 1   No current facility-administered medications for this encounter.    Physical Findings: The patient is in no acute distress. Patient is alert and oriented.  height is _0  (1.727 m) and weight is 247 lb 8 oz (112.3 kg). His temporal temperature is 97.1 F (36.2 C) (abnormal). His blood pressure is 147/89 (abnormal) and his pulse is 92. His respiration is 18 and oxygen saturation is 99%. .   Lungs are clear to auscultation bilaterally. Heart has regular rate and rhythm. No palpable cervical, supraclavicular, or axillary adenopathy. Abdomen soft, non-tender, normal bowel sounds.   Lab Findings: Lab Results  Component Value Date   WBC 5.3 01/31/2022   HGB 10.6 (L) 01/31/2022   HCT 32.9 (L) 01/31/2022   MCV 101.2 (H) 01/31/2022   PLT 117 (L) 01/31/2022    Radiographic Findings: CT Chest W Contrast  Result Date: 02/03/2022 CLINICAL DATA:  76 year old male with history of non-small cell lung cancer (adenocarcinoma) with history of right lower lobe lung mass and right paratracheal lymphadenopathy diagnosed in June 2022.  Follow-up study. EXAM: CT CHEST WITH CONTRAST TECHNIQUE: Multidetector CT imaging of the chest was performed during intravenous contrast administration. RADIATION DOSE REDUCTION: This exam was performed according to the departmental dose-optimization program which includes automated exposure control, adjustment of the mA and/or kV according to patient size and/or use of iterative reconstruction technique. CONTRAST:  52m OMNIPAQUE IOHEXOL 300 MG/ML  SOLN COMPARISON:  Chest CT 10/21/2021. FINDINGS: Cardiovascular: Heart size is normal. There is no significant pericardial fluid, thickening or pericardial calcification. There is aortic atherosclerosis, as well as atherosclerosis of the great vessels of the mediastinum and the coronary arteries, including calcified atherosclerotic plaque in the left main, left anterior descending, left circumflex and right coronary arteries. Status post median sternotomy for CABG including LIMA to the LAD. Calcifications of the aortic valve. Mediastinum/Nodes: No pathologically enlarged mediastinal or hilar lymph nodes. Esophagus is unremarkable in appearance. No axillary lymphadenopathy. Lungs/Pleura: Previously noted right lower  lobe mass is similar to prior examinations (axial image 88 of series 7) measuring 5.5 x 3.9 cm on today's examination. Adjacent to this in the posterior aspect of the right upper lobe (axial image 84 of series 7) there is an aggressive appearing 3.3 x 1.2 cm mass-like area of architectural distortion which has progressively become more apparent over prior examinations, concerning for slow-growing neoplasm such as an adenocarcinoma. In addition, there is a persistent mass-like area in the left lower lobe (axial image 104 of series 7) measuring 5.4 x 3.5 cm, previously hypermetabolic on prior PET-CT 09/81/1914, concerning for neoplasm. Small right pleural effusion again noted. No left pleural effusion. Mild diffuse bronchial wall thickening with very mild  centrilobular and paraseptal emphysema. Upper Abdomen: Aortic atherosclerosis. Musculoskeletal: Median sternotomy wires. There are no aggressive appearing lytic or blastic lesions noted in the visualized portions of the skeleton. IMPRESSION: 1. Multiple mass-like areas in the lungs bilaterally. The right lower lobe lesion is very similar to prior examinations, while the right upper and left lower lobe lesions both appear slightly more prominent than prior examinations, as detailed above. 2. Aortic atherosclerosis, in addition to left main and three-vessel coronary artery disease. Status post median sternotomy for CABG including LIMA to the LAD. 3. Mild diffuse bronchial wall thickening with very mild centrilobular and paraseptal emphysema; imaging findings suggestive of underlying COPD. Aortic Atherosclerosis (ICD10-I70.0) and Emphysema (ICD10-J43.9). Electronically Signed   By: Vinnie Langton M.D.   On: 02/03/2022 10:05    Impression: The encounter diagnosis was Adenocarcinoma of right lung, stage 3 ([place]).   Stage III-B Adenocarcinoma of the RLL, (cT3, cN2, cM0)   The patient is recovering from the effects of radiation.  No evidence of recurrence on clinical exam today.  Recent chest CT scan shows stability without progression.  He has seen Dr. Earlie Server and will begin immunotherapy in the near future.  Plan: As needed follow-up in radiation oncology.  Immunotherapy as above.    ____________________________________  Blair Promise, PhD, MD   This document serves as a record of services personally performed by [name], MD. It was created on his behalf by Roney Mans, a trained medical scribe. The creation of this record is based on the scribe's personal observations and the provider's statements to them. This document has been checked and approved by the attending provider.

## 2022-02-12 ENCOUNTER — Inpatient Hospital Stay: Payer: No Typology Code available for payment source

## 2022-02-12 ENCOUNTER — Other Ambulatory Visit: Payer: Self-pay

## 2022-02-12 VITALS — BP 142/77 | HR 91 | Temp 98.0°F | Resp 18 | Wt 249.2 lb

## 2022-02-12 DIAGNOSIS — C3491 Malignant neoplasm of unspecified part of right bronchus or lung: Secondary | ICD-10-CM

## 2022-02-12 DIAGNOSIS — Z5112 Encounter for antineoplastic immunotherapy: Secondary | ICD-10-CM | POA: Diagnosis not present

## 2022-02-12 LAB — CBC WITH DIFFERENTIAL (CANCER CENTER ONLY)
Abs Immature Granulocytes: 0.01 10*3/uL (ref 0.00–0.07)
Basophils Absolute: 0 10*3/uL (ref 0.0–0.1)
Basophils Relative: 1 %
Eosinophils Absolute: 0.1 10*3/uL (ref 0.0–0.5)
Eosinophils Relative: 3 %
HCT: 36.5 % — ABNORMAL LOW (ref 39.0–52.0)
Hemoglobin: 11.8 g/dL — ABNORMAL LOW (ref 13.0–17.0)
Immature Granulocytes: 0 %
Lymphocytes Relative: 24 %
Lymphs Abs: 1 10*3/uL (ref 0.7–4.0)
MCH: 32.9 pg (ref 26.0–34.0)
MCHC: 32.3 g/dL (ref 30.0–36.0)
MCV: 101.7 fL — ABNORMAL HIGH (ref 80.0–100.0)
Monocytes Absolute: 0.7 10*3/uL (ref 0.1–1.0)
Monocytes Relative: 16 %
Neutro Abs: 2.3 10*3/uL (ref 1.7–7.7)
Neutrophils Relative %: 56 %
Platelet Count: 164 10*3/uL (ref 150–400)
RBC: 3.59 MIL/uL — ABNORMAL LOW (ref 4.22–5.81)
RDW: 18.5 % — ABNORMAL HIGH (ref 11.5–15.5)
WBC Count: 4.1 10*3/uL (ref 4.0–10.5)
nRBC: 0 % (ref 0.0–0.2)

## 2022-02-12 LAB — CMP (CANCER CENTER ONLY)
ALT: 8 U/L (ref 0–44)
AST: 17 U/L (ref 15–41)
Albumin: 3.9 g/dL (ref 3.5–5.0)
Alkaline Phosphatase: 110 U/L (ref 38–126)
Anion gap: 6 (ref 5–15)
BUN: 10 mg/dL (ref 8–23)
CO2: 26 mmol/L (ref 22–32)
Calcium: 9.2 mg/dL (ref 8.9–10.3)
Chloride: 109 mmol/L (ref 98–111)
Creatinine: 1.23 mg/dL (ref 0.61–1.24)
GFR, Estimated: >60 mL/min (ref >60)
Glucose, Bld: 159 mg/dL — ABNORMAL HIGH (ref 70–99)
Potassium: 4.1 mmol/L (ref 3.5–5.1)
Sodium: 141 mmol/L (ref 135–145)
Total Bilirubin: 0.5 mg/dL (ref 0.3–1.2)
Total Protein: 7.1 g/dL (ref 6.5–8.1)

## 2022-02-12 LAB — TSH: TSH: 8.085 u[IU]/mL — ABNORMAL HIGH (ref 0.320–4.118)

## 2022-02-12 MED ORDER — SODIUM CHLORIDE 0.9 % IV SOLN: Freq: Once | INTRAVENOUS | Status: DC

## 2022-02-12 MED ORDER — SODIUM CHLORIDE 0.9 % IV SOLN
1500.0000 mg | Freq: Once | INTRAVENOUS | Status: AC
  Administered 2022-02-12: 1500 mg via INTRAVENOUS
  Filled 2022-02-12: qty 30

## 2022-02-12 NOTE — Patient Instructions (Signed)
[facility]  Discharge Instructions: Thank you for choosing  [facility] to provide your oncology and hematology care.   If you have a lab appointment with the Cancer Center, please go directly to the Cancer Center and check in at the registration area.   Wear comfortable clothing and clothing appropriate for easy access to any Portacath or PICC line.   We strive to give you quality time with your provider. You may need to reschedule your appointment if you arrive late (15 or more minutes).  Arriving late affects you and other patients whose appointments are after yours.  Also, if you miss three or more appointments without notifying the office, you may be dismissed from the clinic at the provider's discretion.      For prescription refill requests, have your pharmacy contact our office and allow 72 hours for refills to be completed.    Today you received the following chemotherapy and/or immunotherapy agents Imfinzi      To help prevent nausea and vomiting after your treatment, we encourage you to take your nausea medication as directed.  BELOW ARE SYMPTOMS THAT SHOULD BE REPORTED IMMEDIATELY: *FEVER GREATER THAN 100.4 F (38 C) OR HIGHER *CHILLS OR SWEATING *NAUSEA AND VOMITING THAT IS NOT CONTROLLED WITH YOUR NAUSEA MEDICATION *UNUSUAL SHORTNESS OF BREATH *UNUSUAL BRUISING OR BLEEDING *URINARY PROBLEMS (pain or burning when urinating, or frequent urination) *BOWEL PROBLEMS (unusual diarrhea, constipation, pain near the anus) TENDERNESS IN MOUTH AND THROAT WITH OR WITHOUT PRESENCE OF ULCERS (sore throat, sores in mouth, or a toothache) UNUSUAL RASH, SWELLING OR PAIN  UNUSUAL VAGINAL DISCHARGE OR ITCHING   Items with * indicate a potential emergency and should be followed up as soon as possible or go to the Emergency Department if any problems should occur.  Please show the CHEMOTHERAPY ALERT CARD or IMMUNOTHERAPY ALERT CARD at check-in to the  Emergency Department and triage nurse.  Should you have questions after your visit or need to cancel or reschedule your appointment, please contact [facility]  Dept: [phone]  and follow the prompts.  Office hours are 8:00 a.m. to 4:30 p.m. Monday - Friday. Please note that voicemails left after 4:00 p.m. may not be returned until the following business day.  We are closed weekends and major holidays. You have access to a nurse at all times for urgent questions. Please call the main number to the clinic Dept: [phone] and follow the prompts.   For any non-urgent questions, you may also contact your provider using MyChart. We now offer e-Visits for anyone 18 and older to request care online for non-urgent symptoms. For details visit [email or web address].   Also download the MyChart app! Go to the app store, search "MyChart", open the app, select [facility], and log in with your MyChart username and password.  Due to Covid, a mask is required upon entering the hospital/clinic. If you do not have a mask, one will be given to you upon arrival. For doctor visits, patients may have 1 support person aged 18 or older with them. For treatment visits, patients cannot have anyone with them due to current Covid guidelines and our immunocompromised population.   

## 2022-02-13 ENCOUNTER — Telehealth: Payer: Self-pay

## 2022-02-13 NOTE — Telephone Encounter (Signed)
Paul [name] states that he is doing well. He is eating, drinking, and urinating well. He knows to call the office at [phone] if he has nay questions or concerns.

## 2022-02-13 NOTE — Telephone Encounter (Signed)
-----   Message from Tildon Husky, RN sent at 02/12/2022  3:52 PM EST ----- Regarding: first time treatment call back-mohamed Patient received imfinzi today. He is seen by Dr. Julien Nordmann. He tolerated treatment well with no issues.

## 2022-03-11 ENCOUNTER — Other Ambulatory Visit: Payer: Self-pay

## 2022-03-11 ENCOUNTER — Inpatient Hospital Stay: Payer: No Typology Code available for payment source

## 2022-03-11 ENCOUNTER — Encounter: Payer: Self-pay | Admitting: Internal Medicine

## 2022-03-11 ENCOUNTER — Inpatient Hospital Stay: Payer: No Typology Code available for payment source | Attending: Internal Medicine

## 2022-03-11 ENCOUNTER — Inpatient Hospital Stay (HOSPITAL_BASED_OUTPATIENT_CLINIC_OR_DEPARTMENT_OTHER): Payer: No Typology Code available for payment source | Admitting: Internal Medicine

## 2022-03-11 VITALS — BP 144/78 | HR 92 | Temp 96.6°F | Resp 20 | Ht 68.0 in | Wt 248.9 lb

## 2022-03-11 DIAGNOSIS — Z5111 Encounter for antineoplastic chemotherapy: Secondary | ICD-10-CM | POA: Diagnosis present

## 2022-03-11 DIAGNOSIS — Z79899 Other long term (current) drug therapy: Secondary | ICD-10-CM | POA: Insufficient documentation

## 2022-03-11 DIAGNOSIS — C3491 Malignant neoplasm of unspecified part of right bronchus or lung: Secondary | ICD-10-CM

## 2022-03-11 DIAGNOSIS — Z5112 Encounter for antineoplastic immunotherapy: Secondary | ICD-10-CM | POA: Insufficient documentation

## 2022-03-11 DIAGNOSIS — C3431 Malignant neoplasm of lower lobe, right bronchus or lung: Secondary | ICD-10-CM | POA: Diagnosis not present

## 2022-03-11 LAB — CMP (CANCER CENTER ONLY)
ALT: 8 U/L (ref 0–44)
AST: 13 U/L — ABNORMAL LOW (ref 15–41)
Albumin: 3.7 g/dL (ref 3.5–5.0)
Alkaline Phosphatase: 119 U/L (ref 38–126)
Anion gap: 8 (ref 5–15)
BUN: 18 mg/dL (ref 8–23)
CO2: 25 mmol/L (ref 22–32)
Calcium: 9.1 mg/dL (ref 8.9–10.3)
Chloride: 108 mmol/L (ref 98–111)
Creatinine: 1.47 mg/dL — ABNORMAL HIGH (ref 0.61–1.24)
GFR, Estimated: 49 mL/min — ABNORMAL LOW (ref >60)
Glucose, Bld: 189 mg/dL — ABNORMAL HIGH (ref 70–99)
Potassium: 4.1 mmol/L (ref 3.5–5.1)
Sodium: 141 mmol/L (ref 135–145)
Total Bilirubin: 0.4 mg/dL (ref 0.3–1.2)
Total Protein: 6.8 g/dL (ref 6.5–8.1)

## 2022-03-11 LAB — CBC WITH DIFFERENTIAL (CANCER CENTER ONLY)
Abs Immature Granulocytes: 0.02 10*3/uL (ref 0.00–0.07)
Basophils Absolute: 0 10*3/uL (ref 0.0–0.1)
Basophils Relative: 1 %
Eosinophils Absolute: 0.1 10*3/uL (ref 0.0–0.5)
Eosinophils Relative: 2 %
HCT: 37.1 % — ABNORMAL LOW (ref 39.0–52.0)
Hemoglobin: 11.9 g/dL — ABNORMAL LOW (ref 13.0–17.0)
Immature Granulocytes: 0 %
Lymphocytes Relative: 19 %
Lymphs Abs: 1.3 10*3/uL (ref 0.7–4.0)
MCH: 33.1 pg (ref 26.0–34.0)
MCHC: 32.1 g/dL (ref 30.0–36.0)
MCV: 103.1 fL — ABNORMAL HIGH (ref 80.0–100.0)
Monocytes Absolute: 0.9 10*3/uL (ref 0.1–1.0)
Monocytes Relative: 13 %
Neutro Abs: 4.4 10*3/uL (ref 1.7–7.7)
Neutrophils Relative %: 65 %
Platelet Count: 237 10*3/uL (ref 150–400)
RBC: 3.6 MIL/uL — ABNORMAL LOW (ref 4.22–5.81)
RDW: 16.2 % — ABNORMAL HIGH (ref 11.5–15.5)
WBC Count: 6.7 10*3/uL (ref 4.0–10.5)
nRBC: 0 % (ref 0.0–0.2)

## 2022-03-11 LAB — TSH: TSH: 4.223 u[IU]/mL — ABNORMAL HIGH (ref 0.320–4.118)

## 2022-03-11 MED ORDER — SODIUM CHLORIDE 0.9 % IV SOLN: Freq: Once | INTRAVENOUS | Status: AC

## 2022-03-11 MED ORDER — HYDROCOD POLI-CHLORPHE POLI ER 10-8 MG/5ML PO SUER: 5.0000 mL | Freq: Two times a day (BID) | ORAL | 0 refills | Status: DC | PRN

## 2022-03-11 MED ORDER — SODIUM CHLORIDE 0.9 % IV SOLN
1500.0000 mg | Freq: Once | INTRAVENOUS | Status: AC
  Administered 2022-03-11: 1500 mg via INTRAVENOUS
  Filled 2022-03-11: qty 30

## 2022-03-11 NOTE — Patient Instructions (Signed)
[facility]  Discharge Instructions: °Thank you for choosing [facility] to provide your oncology and hematology care.  ° °If you have a lab appointment with the Cancer Center, please go directly to the Cancer Center and check in at the registration area. °  °Wear comfortable clothing and clothing appropriate for easy access to any Portacath or PICC line.  ° °We strive to give you quality time with your provider. You may need to reschedule your appointment if you arrive late (15 or more minutes).  Arriving late affects you and other patients whose appointments are after yours.  Also, if you miss three or more appointments without notifying the office, you may be dismissed from the clinic at the provider’s discretion.    °  °For prescription refill requests, have your pharmacy contact our office and allow 72 hours for refills to be completed.   ° °Today you received the following chemotherapy and/or immunotherapy agents :  Durvalumab °    °  °To help prevent nausea and vomiting after your treatment, we encourage you to take your nausea medication as directed. ° °BELOW ARE SYMPTOMS THAT SHOULD BE REPORTED IMMEDIATELY: °*FEVER GREATER THAN 100.4 F (38 °C) OR HIGHER °*CHILLS OR SWEATING °*NAUSEA AND VOMITING THAT IS NOT CONTROLLED WITH YOUR NAUSEA MEDICATION °*UNUSUAL SHORTNESS OF BREATH °*UNUSUAL BRUISING OR BLEEDING °*URINARY PROBLEMS (pain or burning when urinating, or frequent urination) °*BOWEL PROBLEMS (unusual diarrhea, constipation, pain near the anus) °TENDERNESS IN MOUTH AND THROAT WITH OR WITHOUT PRESENCE OF ULCERS (sore throat, sores in mouth, or a toothache) °UNUSUAL RASH, SWELLING OR PAIN  °UNUSUAL VAGINAL DISCHARGE OR ITCHING  ° °Items with * indicate a potential emergency and should be followed up as soon as possible or go to the Emergency Department if any problems should occur. ° °Please show the CHEMOTHERAPY ALERT CARD or IMMUNOTHERAPY ALERT CARD at  check-in to the Emergency Department and triage nurse. ° °Should you have questions after your visit or need to cancel or reschedule your appointment, please contact [facility]  Dept: [phone]  and follow the prompts.  Office hours are 8:00 a.m. to 4:30 p.m. Monday - Friday. Please note that voicemails left after 4:00 p.m. may not be returned until the following business day.  We are closed weekends and major holidays. You have access to a nurse at all times for urgent questions. Please call the main number to the clinic Dept: [phone] and follow the prompts. ° ° °For any non-urgent questions, you may also contact your provider using MyChart. We now offer e-Visits for anyone 18 and older to request care online for non-urgent symptoms. For details visit [email or web address]. °  °Also download the MyChart app! Go to the app store, search "MyChart", open the app, select , and log in with your MyChart username and password. ° °Due to Covid, a mask is required upon entering the hospital/clinic. If you do not have a mask, one will be given to you upon arrival. For doctor visits, patients may have 1 support person aged 18 or older with them. For treatment visits, patients cannot have anyone with them due to current Covid guidelines and our immunocompromised population.  ° °

## 2022-03-11 NOTE — Progress Notes (Signed)
?    [facility] ?[phone]   [phone] ? ?OFFICE PROGRESS NOTE ? ?Cl[facility], [name] ?[address] ?[place] [address] ? ?DIAGNOSIS: Stage IIIB (T3b, N2, M0) non-small cell lung cancer, adenocarcinoma presented with large right lower lobe lung mass and suspicious right lower paratracheal lymphadenopathy diagnosed in June 2022. ? ?Molecular studies by Guardant 360 showed no actionable mutations ? ?PRIOR THERAPY:  ?1) Neoadjuvant systemic chemotherapy according to the Checkmate 816 with carboplatin for AUC of 5, Alimta 500 Mg/M2 and nivolumab 360 Mg IV every 3 weeks for 3 cycles.  Last dose 09/24/21.  Status post 3 cycles.  ?2) Concurrent chemoradiation with carboplatin for an AUC of 2 and paclitaxel 45 mg per metered square.  First dose expected on 11/04/2021.  Status post 7 cycles. ?  ?CURRENT THERAPY: Consolidation treatment with immunotherapy with Imfinzi 1500 Mg IV every 4 weeks.  First dose February 12, 2022.  Status post 1 cycle. ? ?INTERVAL HISTORY: ?Paul [name] 76 y.o. male returns to the clinic today for follow-up visit.  The patient is feeling fine today with no concerning complaints except for dry cough as well as the arthralgia in his knees.  He tolerated the first cycle of his consolidation treatment with immunotherapy fairly well.  He denied having any chest pain, shortness of breath or hemoptysis.  He has no nausea, vomiting, diarrhea or constipation.  He has no skin rash.  He is here today for evaluation before starting cycle #2. ? ? ?MEDICAL HISTORY: ?Past Medical History:  ?Diagnosis Date  ? CHF (congestive heart failure) ([place])   ? Coronary artery disease   ? Diabetes mellitus without complication ([place])   ? History of radiation therapy   ? Right lung- 11/27/21-01/10/22- Dr. [name]  ? Hypertension   ? Hypothyroidism   ? Ischemic cardiomyopathy   ? Pneumonia   ? a long time ago  ? Sleep apnea   ? no longer uses a cpap   ? ? ?ALLERGIES:  has No Known Allergies. ? ?MEDICATIONS:  ?Current Outpatient Medications  ?Medication Sig Dispense Refill  ? aspirin 81 MG chewable tablet Take 81 mg by mouth daily.    ? blood glucose meter kit and supplies KIT Dispense based on patient and insurance preference. Use up to four times daily as directed. 1 each 0  ? carvedilol (COREG) 25 MG tablet Take 25 mg by mouth at bedtime.    ? chlorpheniramine-HYDROcodone (TUSSIONEX) 10-8 MG/5ML SUER Take 5 mLs by mouth at bedtime as needed for cough. 70 mL 0  ? folic acid (FOLVITE) 1 MG tablet Take 1 tablet (1 mg total) by mouth daily. 30 tablet 4  ? furosemide (LASIX) 20 MG tablet Take 20 mg by mouth at bedtime.    ? insulin glargine (LANTUS) 100 UNIT/ML Solostar Pen Inject 10 Units into the skin daily. 5 mL 5  ? levothyroxine (SYNTHROID) 25 MCG tablet Take 25 mcg by mouth at bedtime.    ? lidocaine-prilocaine (EMLA) cream Apply 1 application topically as needed. 30 g 2  ? Menthol-Methyl Salicylate (MUSCLE RUB) 10-15 % CREA Apply 1 application topically daily as needed for muscle pain.    ? metFORMIN (GLUCOPHAGE) 500 MG tablet Take 1 tablet (500 mg total) by mouth 2 (two) times daily with a meal. 60 tablet 3  ? Multiple Vitamins-Minerals (MULTIVITAMIN WITH MINERALS) tablet Take 1 tablet by mouth daily.    ? nitroGLYCERIN (NITROSTAT) 0.4 MG SL tablet Place 0.4 mg under the  tongue every 5 (five) minutes as needed for chest pain.    ? omeprazole (PRILOSEC) 20 MG capsule Take 20 mg by mouth daily. (Patient not taking: Reported on 11/20/2021)    ? prochlorperazine (COMPAZINE) 10 MG tablet Take 1 tablet (10 mg total) by mouth every 6 (six) hours as needed for nausea or vomiting. 30 tablet 0  ? sacubitril-valsartan (ENTRESTO) 49-51 MG Take 0.5 tablets by mouth 2 (two) times daily.    ? sucralfate (CARAFATE) 1 g tablet Take 1 tablet (1 g total) by mouth 4 (four) times daily -  with meals and at bedtime. Crush and dissolve in 10 mL of warm water prior to swallowing,  take before meals 120 tablet 1  ? ?No current facility-administered medications for this visit.  ? ? ?SURGICAL HISTORY:  ?Past Surgical History:  ?Procedure Laterality Date  ? BRONCHIAL BIOPSY  06/04/2021  ? Procedure: BRONCHIAL BIOPSIES;  Surgeon: Garner Nash, DO;  Location: [facility];  Service: Pulmonary;;  ? BRONCHIAL BRUSHINGS  06/04/2021  ? Procedure: BRONCHIAL BRUSHINGS;  Surgeon: Garner Nash, DO;  Location: [facility];  Service: Pulmonary;;  ? BRONCHIAL NEEDLE ASPIRATION BIOPSY  06/04/2021  ? Procedure: BRONCHIAL NEEDLE ASPIRATION BIOPSIES;  Surgeon: Garner Nash, DO;  Location: [facility];  Service: Pulmonary;;  ? BRONCHIAL WASHINGS  06/04/2021  ? Procedure: BRONCHIAL WASHINGS;  Surgeon: Garner Nash, DO;  Location: [facility];  Service: Pulmonary;;  ? CARDIAC SURGERY    ? COLONOSCOPY    ? CORONARY ARTERY BYPASS GRAFT    ? 2019 at [facility]  ? VIDEO BRONCHOSCOPY WITH ENDOBRONCHIAL NAVIGATION Right 06/04/2021  ? Procedure: VIDEO BRONCHOSCOPY WITH ENDOBRONCHIAL NAVIGATION;  Surgeon: Garner Nash, DO;  Location: [facility];  Service: Pulmonary;  Laterality: Right;  ? VIDEO BRONCHOSCOPY WITH ENDOBRONCHIAL ULTRASOUND  06/04/2021  ? Procedure: VIDEO BRONCHOSCOPY WITH ENDOBRONCHIAL ULTRASOUND;  Surgeon: Garner Nash, DO;  Location: [facility];  Service: Pulmonary;;  ? ? ?REVIEW OF SYSTEMS:  A comprehensive review of systems was negative except for: Respiratory: positive for cough ?Musculoskeletal: positive for arthralgias  ? ?PHYSICAL EXAMINATION: General appearance: alert, cooperative, fatigued, and no distress ?Head: Normocephalic, without obvious abnormality, atraumatic ?Neck: no adenopathy, no JVD, supple, symmetrical, trachea midline, and thyroid not enlarged, symmetric, no tenderness/mass/nodules ?Lymph nodes: Cervical, supraclavicular, and axillary nodes normal. ?Resp: clear to auscultation bilaterally ?Back: symmetric, no curvature. ROM normal. No CVA tenderness. ?Cardio:  regular rate and rhythm, S1, S2 normal, no murmur, click, rub or gallop ?GI: soft, non-tender; bowel sounds normal; no masses,  no organomegaly ?Extremities: extremities normal, atraumatic, no cyanosis or edema ? ?ECOG PERFORMANCE STATUS: 1 - Symptomatic but completely ambulatory ? ?Blood pressure (!) 144/78, pulse 92, temperature (!) 96.6 ?F (35.9 ?C), temperature source Tympanic, resp. rate 20, height _0  (1.727 m), weight 248 lb 14.4 oz (112.9 kg), SpO2 98 %. ? ?LABORATORY DATA: ?Lab Results  ?Component Value Date  ? WBC 6.7 03/11/2022  ? HGB 11.9 (L) 03/11/2022  ? HCT 37.1 (L) 03/11/2022  ? MCV 103.1 (H) 03/11/2022  ? PLT 237 03/11/2022  ? ? ?  Chemistry   ?   ?Component Value Date/Time  ? NA 141 02/12/2022 1316  ? K 4.1 02/12/2022 1316  ? CL 109 02/12/2022 1316  ? CO2 26 02/12/2022 1316  ? BUN 10 02/12/2022 1316  ? CREATININE 1.23 02/12/2022 1316  ?    ?Component Value Date/Time  ? CALCIUM 9.2 02/12/2022 1316  ? ALKPHOS 110 02/12/2022 1316  ?  AST 17 02/12/2022 1316  ? ALT 8 02/12/2022 1316  ? BILITOT 0.5 02/12/2022 1316  ?  ? ? ? ?RADIOGRAPHIC STUDIES: ?No results found. ? ?ASSESSMENT AND PLAN: This is a very pleasant 76 years old African-American male diagnosed with a stage IIIB (T3b, N2, M0) non-small cell lung cancer, adenocarcinoma presented with large right lower lobe lung mass and suspicious right lower lobe paratracheal lymphadenopathy diagnosed in June 2022. ?Has MRI of the brain is still pending. ?Molecular studies by Guardant 360 showed no actionable mutations. ?The patient underwent neoadjuvant chemotherapy according to the Checkmate 816 with carboplatin for AUC of 5, Alimta 500 Mg/M2 and nivolumab 360 Mg IV every 3 weeks.  Status post 3 cycles. ?The patient continues to tolerate his treatment well with no concerning adverse effects.  Repeat imaging studies after the neoadjuvant treatment showed stable disease and the patient was not a good candidate for surgical resection. ?He is currently  undergoing a course of concurrent chemoradiation with weekly carboplatin for AUC of 2 and paclitaxel 45 Mg/M2 status post 7 cycles.  Last cycle was given January 02, 2022.   ?The patient tolerated the previous course of

## 2022-03-14 ENCOUNTER — Other Ambulatory Visit: Payer: Self-pay | Admitting: Physician Assistant

## 2022-03-14 DIAGNOSIS — C3491 Malignant neoplasm of unspecified part of right bronchus or lung: Secondary | ICD-10-CM

## 2022-03-14 MED ORDER — HYDROCOD POLI-CHLORPHE POLI ER 10-8 MG/5ML PO SUER: 5.0000 mL | Freq: Two times a day (BID) | ORAL | 0 refills | Status: DC | PRN

## 2022-04-04 NOTE — Progress Notes (Signed)
[facility] ?OFFICE PROGRESS NOTE ? ?Cl[facility], [name] ?[address] ?[place] [address] ? ?DIAGNOSIS: Stage IIIB (T3b, N2, M0) non-small cell lung cancer, adenocarcinoma presented with large right lower lobe lung mass and suspicious right lower paratracheal lymphadenopathy diagnosed in June 2022. ?  ?Molecular studies by Guardant 360 showed no actionable mutations ? ?PRIOR THERAPY:  ?1) Neoadjuvant systemic chemotherapy according to the Checkmate 816 with carboplatin for AUC of 5, Alimta 500 Mg/M2 and nivolumab 360 Mg IV every 3 weeks for 3 cycles.  Last dose 09/24/21.  Status post 3 cycles ?2) 2) Concurrent chemoradiation with carboplatin for an AUC of 2 and paclitaxel 45 mg per metered square.  First dose expected on 11/04/2021.  Status post 7 cycles. ?  ? ?CURRENT THERAPY: Consolidation treatment with immunotherapy with Imfinzi 1500 Mg IV every 4 weeks.  First dose February 12, 2022.  Status post 2 cycles. ? ?INTERVAL HISTORY: ?Paul [name] 76 y.o. male returns to the clinic today for a follow-up visit.  The patient is feeling fairly well today without any concerning complaints. He mentions that at the [facility] he had an xray of his left knee and has what sounds like is severe arthritis. He states he is supposed to do therapy next month. He is not sure if he is going to see an orthopedic provider. He is currently undergoing treatment with consolidation immunotherapy with Imfinzi.  He is status post 2 cycles and is tolerating it well without any concerning adverse side effects.  He denies any fever, chills, night sweats, or unexplained weight loss.  He denies any hemoptysis.  He reports stable breathing and he occasionally has dyspnea depending on the activity.  He is a mild cough that produces clear phlegm. He uses tussinex but is unsure which one. He continues to smoke and is smoking <1 ppd. He smokes approximately cut down to <1 ppd.  He denies any chest pain.  Denies any  nausea, vomiting, diarrhea.  He sometimes has mild constipation every "once in awhile". Denies any headache or visual changes. He has some itching/dry skin over the prior radiation site. He is here today for evaluation and repeat blood work before starting cycle #3. ? ?MEDICAL HISTORY: ?Past Medical History:  ?Diagnosis Date  ? CHF (congestive heart failure) ([place])   ? Coronary artery disease   ? Diabetes mellitus without complication ([place])   ? History of radiation therapy   ? Right lung- 11/27/21-01/10/22- Dr. [name]  ? Hypertension   ? Hypothyroidism   ? Ischemic cardiomyopathy   ? Pneumonia   ? a long time ago  ? Sleep apnea   ? no longer uses a cpap  ? ? ?ALLERGIES:  has No Known Allergies. ? ?MEDICATIONS:  ?Current Outpatient Medications  ?Medication Sig Dispense Refill  ? aspirin 81 MG chewable tablet Take 81 mg by mouth daily.    ? blood glucose meter kit and supplies KIT Dispense based on patient and insurance preference. Use up to four times daily as directed. 1 each 0  ? carvedilol (COREG) 25 MG tablet Take 25 mg by mouth at bedtime.    ? chlorpheniramine-HYDROcodone (TUSSIONEX PENNKINETIC ER) 10-8 MG/5ML Take 5 mLs by mouth every 12 (twelve) hours as needed for cough. 222 mL 0  ? folic acid (FOLVITE) 1 MG tablet Take 1 tablet (1 mg total) by mouth daily. 30 tablet 4  ? furosemide (LASIX) 20 MG tablet Take 20 mg by mouth at bedtime.    ?  insulin glargine (LANTUS) 100 UNIT/ML Solostar Pen Inject 10 Units into the skin daily. 5 mL 5  ? levothyroxine (SYNTHROID) 25 MCG tablet Take 25 mcg by mouth at bedtime.    ? lidocaine-prilocaine (EMLA) cream Apply 1 application topically as needed. (Patient not taking: Reported on 03/11/2022) 30 g 2  ? Menthol-Methyl Salicylate (MUSCLE RUB) 10-15 % CREA Apply 1 application topically daily as needed for muscle pain.    ? metFORMIN (GLUCOPHAGE) 500 MG tablet Take 1 tablet (500 mg total) by mouth 2 (two) times daily with a meal. 60 tablet 3  ? Multiple Vitamins-Minerals  (MULTIVITAMIN WITH MINERALS) tablet Take 1 tablet by mouth daily.    ? nitroGLYCERIN (NITROSTAT) 0.4 MG SL tablet Place 0.4 mg under the tongue every 5 (five) minutes as needed for chest pain. (Patient not taking: Reported on 03/11/2022)    ? omeprazole (PRILOSEC) 20 MG capsule Take 20 mg by mouth daily. (Patient not taking: Reported on 11/20/2021)    ? prochlorperazine (COMPAZINE) 10 MG tablet Take 1 tablet (10 mg total) by mouth every 6 (six) hours as needed for nausea or vomiting. (Patient not taking: Reported on 03/11/2022) 30 tablet 0  ? sacubitril-valsartan (ENTRESTO) 49-51 MG Take 0.5 tablets by mouth 2 (two) times daily.    ? sucralfate (CARAFATE) 1 g tablet Take 1 tablet (1 g total) by mouth 4 (four) times daily -  with meals and at bedtime. Crush and dissolve in 10 mL of warm water prior to swallowing, take before meals (Patient not taking: Reported on 03/11/2022) 120 tablet 1  ? ?No current facility-administered medications for this visit.  ? ? ?SURGICAL HISTORY:  ?Past Surgical History:  ?Procedure Laterality Date  ? BRONCHIAL BIOPSY  06/04/2021  ? Procedure: BRONCHIAL BIOPSIES;  Surgeon: Garner Nash, DO;  Location: [facility];  Service: Pulmonary;;  ? BRONCHIAL BRUSHINGS  06/04/2021  ? Procedure: BRONCHIAL BRUSHINGS;  Surgeon: Garner Nash, DO;  Location: [facility];  Service: Pulmonary;;  ? BRONCHIAL NEEDLE ASPIRATION BIOPSY  06/04/2021  ? Procedure: BRONCHIAL NEEDLE ASPIRATION BIOPSIES;  Surgeon: Garner Nash, DO;  Location: [facility];  Service: Pulmonary;;  ? BRONCHIAL WASHINGS  06/04/2021  ? Procedure: BRONCHIAL WASHINGS;  Surgeon: Garner Nash, DO;  Location: [facility];  Service: Pulmonary;;  ? CARDIAC SURGERY    ? COLONOSCOPY    ? CORONARY ARTERY BYPASS GRAFT    ? 2019 at [facility]  ? VIDEO BRONCHOSCOPY WITH ENDOBRONCHIAL NAVIGATION Right 06/04/2021  ? Procedure: VIDEO BRONCHOSCOPY WITH ENDOBRONCHIAL NAVIGATION;  Surgeon: Garner Nash, DO;  Location: [facility];  Service:  Pulmonary;  Laterality: Right;  ? VIDEO BRONCHOSCOPY WITH ENDOBRONCHIAL ULTRASOUND  06/04/2021  ? Procedure: VIDEO BRONCHOSCOPY WITH ENDOBRONCHIAL ULTRASOUND;  Surgeon: Garner Nash, DO;  Location: [facility];  Service: Pulmonary;;  ? ? ?REVIEW OF SYSTEMS:   ?Constitutional: Negative for appetite change, weight loss, chills and fever ?HENT: Negative for mouth sores, nosebleeds, and trouble swallowing.   ?Eyes: Negative for eye problems and icterus.  ?Respiratory: Positive for minor/stable cough and occasional dyspnea with certain strenuous activities.  Negative for hemoptysis and wheezing.   ?Cardiovascular: Negative for chest pain and leg swelling.  ?Gastrointestinal: Positive for occasional mild constipation. Negative for abdominal pain,  diarrhea, nausea and vomiting.  ?Genitourinary: Negative for bladder incontinence, difficulty urinating, dysuria, frequency and hematuria.   ?Musculoskeletal: Negative for back pain, gait problem, neck pain and neck stiffness.  ?Skin: Positive for some itching over prior radiation site.Mar[facility] Kitchen ?Neurological: Negative for  dizziness, extremity weakness, gait problem, headaches, light-headedness and seizures.  ?Hematological: Negative for adenopathy. Does not bruise/bleed easily.  ?Psychiatric/Behavioral: Negative for confusion, depression and sleep disturbance. The patient is not nervous/anxious.    ? ? ?PHYSICAL EXAMINATION:  ?Blood pressure (!) 162/84, pulse 99, temperature (!) 97.4 ?F (36.3 ?C), temperature source Tympanic, resp. rate 20, height _0  (1.727 m), weight 255 lb 3.2 oz (115.8 kg), SpO2 95 %. ? ?ECOG PERFORMANCE STATUS: 1 ? ?Physical Exam  ?Constitutional: Oriented to person, place, and time and well-developed, well-nourished, and in no distress.  ?HENT:  ?Head: Normocephalic and atraumatic.  ?Mouth/Throat: Oropharynx is clear and moist. No oropharyngeal exudate.  ?Eyes: Conjunctivae are normal. Right eye exhibits no discharge. Left eye exhibits no discharge. No  scleral icterus.  ?Neck: Normal range of motion. Neck supple.  ?Cardiovascular: Normal rate, regular rhythm, normal heart sounds and intact distal pulses.   ?Pulmonary/Chest: Effort normal and breath soun

## 2022-04-09 ENCOUNTER — Inpatient Hospital Stay: Payer: No Typology Code available for payment source

## 2022-04-09 ENCOUNTER — Inpatient Hospital Stay: Payer: No Typology Code available for payment source | Attending: Internal Medicine

## 2022-04-09 ENCOUNTER — Other Ambulatory Visit: Payer: Self-pay

## 2022-04-09 ENCOUNTER — Inpatient Hospital Stay (HOSPITAL_BASED_OUTPATIENT_CLINIC_OR_DEPARTMENT_OTHER): Payer: No Typology Code available for payment source | Admitting: Physician Assistant

## 2022-04-09 VITALS — BP 162/84 | HR 99 | Temp 97.4°F | Resp 20 | Ht 68.0 in | Wt 255.2 lb

## 2022-04-09 DIAGNOSIS — Z5112 Encounter for antineoplastic immunotherapy: Secondary | ICD-10-CM | POA: Diagnosis not present

## 2022-04-09 DIAGNOSIS — Z79899 Other long term (current) drug therapy: Secondary | ICD-10-CM | POA: Insufficient documentation

## 2022-04-09 DIAGNOSIS — C3491 Malignant neoplasm of unspecified part of right bronchus or lung: Secondary | ICD-10-CM

## 2022-04-09 DIAGNOSIS — C3431 Malignant neoplasm of lower lobe, right bronchus or lung: Secondary | ICD-10-CM | POA: Diagnosis not present

## 2022-04-09 LAB — CBC WITH DIFFERENTIAL (CANCER CENTER ONLY)
Abs Immature Granulocytes: 0.03 10*3/uL (ref 0.00–0.07)
Basophils Absolute: 0.1 10*3/uL (ref 0.0–0.1)
Basophils Relative: 1 %
Eosinophils Absolute: 0.2 10*3/uL (ref 0.0–0.5)
Eosinophils Relative: 3 %
HCT: 37.3 % — ABNORMAL LOW (ref 39.0–52.0)
Hemoglobin: 12.1 g/dL — ABNORMAL LOW (ref 13.0–17.0)
Immature Granulocytes: 0 %
Lymphocytes Relative: 16 %
Lymphs Abs: 1.1 10*3/uL (ref 0.7–4.0)
MCH: 32.8 pg (ref 26.0–34.0)
MCHC: 32.4 g/dL (ref 30.0–36.0)
MCV: 101.1 fL — ABNORMAL HIGH (ref 80.0–100.0)
Monocytes Absolute: 0.7 10*3/uL (ref 0.1–1.0)
Monocytes Relative: 10 %
Neutro Abs: 4.7 10*3/uL (ref 1.7–7.7)
Neutrophils Relative %: 70 %
Platelet Count: 224 10*3/uL (ref 150–400)
RBC: 3.69 MIL/uL — ABNORMAL LOW (ref 4.22–5.81)
RDW: 14.1 % (ref 11.5–15.5)
WBC Count: 6.8 10*3/uL (ref 4.0–10.5)
nRBC: 0 % (ref 0.0–0.2)

## 2022-04-09 LAB — CMP (CANCER CENTER ONLY)
ALT: 7 U/L (ref 0–44)
AST: 12 U/L — ABNORMAL LOW (ref 15–41)
Albumin: 3.9 g/dL (ref 3.5–5.0)
Alkaline Phosphatase: 134 U/L — ABNORMAL HIGH (ref 38–126)
Anion gap: 6 (ref 5–15)
BUN: 15 mg/dL (ref 8–23)
CO2: 26 mmol/L (ref 22–32)
Calcium: 9.4 mg/dL (ref 8.9–10.3)
Chloride: 107 mmol/L (ref 98–111)
Creatinine: 1.34 mg/dL — ABNORMAL HIGH (ref 0.61–1.24)
GFR, Estimated: 55 mL/min — ABNORMAL LOW (ref >60)
Glucose, Bld: 214 mg/dL — ABNORMAL HIGH (ref 70–99)
Potassium: 4.3 mmol/L (ref 3.5–5.1)
Sodium: 139 mmol/L (ref 135–145)
Total Bilirubin: 0.4 mg/dL (ref 0.3–1.2)
Total Protein: 7.4 g/dL (ref 6.5–8.1)

## 2022-04-09 LAB — TSH: TSH: 12.707 u[IU]/mL — ABNORMAL HIGH (ref 0.320–4.118)

## 2022-04-09 MED ORDER — SODIUM CHLORIDE 0.9 % IV SOLN
1500.0000 mg | Freq: Once | INTRAVENOUS | Status: AC
  Administered 2022-04-09: 1500 mg via INTRAVENOUS
  Filled 2022-04-09: qty 30

## 2022-04-09 MED ORDER — SODIUM CHLORIDE 0.9 % IV SOLN: Freq: Once | INTRAVENOUS | Status: AC

## 2022-04-09 NOTE — Patient Instructions (Signed)
[facility]  Discharge Instructions: °Thank you for choosing [facility] to provide your oncology and hematology care.  ° °If you have a lab appointment with the Cancer Center, please go directly to the Cancer Center and check in at the registration area. °  °Wear comfortable clothing and clothing appropriate for easy access to any Portacath or PICC line.  ° °We strive to give you quality time with your provider. You may need to reschedule your appointment if you arrive late (15 or more minutes).  Arriving late affects you and other patients whose appointments are after yours.  Also, if you miss three or more appointments without notifying the office, you may be dismissed from the clinic at the provider’s discretion.    °  °For prescription refill requests, have your pharmacy contact our office and allow 72 hours for refills to be completed.   ° °Today you received the following chemotherapy and/or immunotherapy agents :  Durvalumab °    °  °To help prevent nausea and vomiting after your treatment, we encourage you to take your nausea medication as directed. ° °BELOW ARE SYMPTOMS THAT SHOULD BE REPORTED IMMEDIATELY: °*FEVER GREATER THAN 100.4 F (38 °C) OR HIGHER °*CHILLS OR SWEATING °*NAUSEA AND VOMITING THAT IS NOT CONTROLLED WITH YOUR NAUSEA MEDICATION °*UNUSUAL SHORTNESS OF BREATH °*UNUSUAL BRUISING OR BLEEDING °*URINARY PROBLEMS (pain or burning when urinating, or frequent urination) °*BOWEL PROBLEMS (unusual diarrhea, constipation, pain near the anus) °TENDERNESS IN MOUTH AND THROAT WITH OR WITHOUT PRESENCE OF ULCERS (sore throat, sores in mouth, or a toothache) °UNUSUAL RASH, SWELLING OR PAIN  °UNUSUAL VAGINAL DISCHARGE OR ITCHING  ° °Items with * indicate a potential emergency and should be followed up as soon as possible or go to the Emergency Department if any problems should occur. ° °Please show the CHEMOTHERAPY ALERT CARD or IMMUNOTHERAPY ALERT CARD at  check-in to the Emergency Department and triage nurse. ° °Should you have questions after your visit or need to cancel or reschedule your appointment, please contact [facility]  Dept: [phone]  and follow the prompts.  Office hours are 8:00 a.m. to 4:30 p.m. Monday - Friday. Please note that voicemails left after 4:00 p.m. may not be returned until the following business day.  We are closed weekends and major holidays. You have access to a nurse at all times for urgent questions. Please call the main number to the clinic Dept: [phone] and follow the prompts. ° ° °For any non-urgent questions, you may also contact your provider using MyChart. We now offer e-Visits for anyone 18 and older to request care online for non-urgent symptoms. For details visit [email or web address]. °  °Also download the MyChart app! Go to the app store, search "MyChart", open the app, select [facility], and log in with your MyChart username and password. ° °Due to Covid, a mask is required upon entering the hospital/clinic. If you do not have a mask, one will be given to you upon arrival. For doctor visits, patients may have 1 support person aged 18 or older with them. For treatment visits, patients cannot have anyone with them due to current Covid guidelines and our immunocompromised population.  ° °

## 2022-04-10 ENCOUNTER — Telehealth: Payer: Self-pay

## 2022-04-10 ENCOUNTER — Other Ambulatory Visit: Payer: Self-pay | Admitting: Physician Assistant

## 2022-04-10 DIAGNOSIS — E039 Hypothyroidism, unspecified: Secondary | ICD-10-CM

## 2022-04-10 MED ORDER — LEVOTHYROXINE SODIUM 50 MCG PO TABS: 50.0000 ug | ORAL_TABLET | Freq: Every day | ORAL | 2 refills | Status: DC

## 2022-04-10 NOTE — Telephone Encounter (Signed)
I spoke with the pt to relay that a rx for Synthroid 75mcg has been sent to his [facility] because his TSH was elevated. Pt confirmed he is not currently taking any medication for his thyroid. Pt expressed understanding of this information. ?

## 2022-05-06 ENCOUNTER — Ambulatory Visit (HOSPITAL_COMMUNITY)
Admission: RE | Admit: 2022-05-06 | Discharge: 2022-05-06 | Disposition: A | Discharge status: Home / Self Care | Payer: No Typology Code available for payment source | Source: Ambulatory Visit | Attending: Physician Assistant | Admitting: Physician Assistant

## 2022-05-06 ENCOUNTER — Ambulatory Visit (HOSPITAL_COMMUNITY): Payer: No Typology Code available for payment source

## 2022-05-06 DIAGNOSIS — C3491 Malignant neoplasm of unspecified part of right bronchus or lung: Secondary | ICD-10-CM | POA: Diagnosis present

## 2022-05-06 LAB — POCT I-STAT CREATININE: Creatinine, Ser: 1.5 mg/dL — ABNORMAL HIGH (ref 0.61–1.24)

## 2022-05-06 MED ORDER — IOHEXOL 300 MG/ML  SOLN
75.0000 mL | Freq: Once | INTRAMUSCULAR | Status: AC | PRN
  Administered 2022-05-06: 75 mL via INTRAVENOUS

## 2022-05-07 ENCOUNTER — Inpatient Hospital Stay (HOSPITAL_BASED_OUTPATIENT_CLINIC_OR_DEPARTMENT_OTHER): Payer: No Typology Code available for payment source | Admitting: Internal Medicine

## 2022-05-07 ENCOUNTER — Other Ambulatory Visit: Payer: Self-pay

## 2022-05-07 ENCOUNTER — Inpatient Hospital Stay: Payer: No Typology Code available for payment source | Attending: Internal Medicine

## 2022-05-07 ENCOUNTER — Inpatient Hospital Stay: Payer: No Typology Code available for payment source

## 2022-05-07 ENCOUNTER — Encounter: Payer: Self-pay | Admitting: Internal Medicine

## 2022-05-07 VITALS — BP 174/92 | HR 97 | Temp 96.0°F | Resp 17 | Wt 252.0 lb

## 2022-05-07 DIAGNOSIS — C3491 Malignant neoplasm of unspecified part of right bronchus or lung: Secondary | ICD-10-CM | POA: Diagnosis not present

## 2022-05-07 DIAGNOSIS — Z5112 Encounter for antineoplastic immunotherapy: Secondary | ICD-10-CM | POA: Insufficient documentation

## 2022-05-07 DIAGNOSIS — C3431 Malignant neoplasm of lower lobe, right bronchus or lung: Secondary | ICD-10-CM | POA: Insufficient documentation

## 2022-05-07 DIAGNOSIS — Z79899 Other long term (current) drug therapy: Secondary | ICD-10-CM | POA: Diagnosis not present

## 2022-05-07 LAB — CMP (CANCER CENTER ONLY)
ALT: 10 U/L (ref 0–44)
AST: 20 U/L (ref 15–41)
Albumin: 3.9 g/dL (ref 3.5–5.0)
Alkaline Phosphatase: 138 U/L — ABNORMAL HIGH (ref 38–126)
Anion gap: 7 (ref 5–15)
BUN: 14 mg/dL (ref 8–23)
CO2: 26 mmol/L (ref 22–32)
Calcium: 9.3 mg/dL (ref 8.9–10.3)
Chloride: 104 mmol/L (ref 98–111)
Creatinine: 1.34 mg/dL — ABNORMAL HIGH (ref 0.61–1.24)
GFR, Estimated: 55 mL/min — ABNORMAL LOW (ref >60)
Glucose, Bld: 389 mg/dL — ABNORMAL HIGH (ref 70–99)
Potassium: 4.1 mmol/L (ref 3.5–5.1)
Sodium: 137 mmol/L (ref 135–145)
Total Bilirubin: 0.4 mg/dL (ref 0.3–1.2)
Total Protein: 7.4 g/dL (ref 6.5–8.1)

## 2022-05-07 LAB — CBC WITH DIFFERENTIAL (CANCER CENTER ONLY)
Abs Immature Granulocytes: 0.02 10*3/uL (ref 0.00–0.07)
Basophils Absolute: 0 10*3/uL (ref 0.0–0.1)
Basophils Relative: 1 %
Eosinophils Absolute: 0.1 10*3/uL (ref 0.0–0.5)
Eosinophils Relative: 2 %
HCT: 39.9 % (ref 39.0–52.0)
Hemoglobin: 13.1 g/dL (ref 13.0–17.0)
Immature Granulocytes: 0 %
Lymphocytes Relative: 17 %
Lymphs Abs: 1 10*3/uL (ref 0.7–4.0)
MCH: 32.3 pg (ref 26.0–34.0)
MCHC: 32.8 g/dL (ref 30.0–36.0)
MCV: 98.5 fL (ref 80.0–100.0)
Monocytes Absolute: 0.7 10*3/uL (ref 0.1–1.0)
Monocytes Relative: 12 %
Neutro Abs: 4.2 10*3/uL (ref 1.7–7.7)
Neutrophils Relative %: 68 %
Platelet Count: 211 10*3/uL (ref 150–400)
RBC: 4.05 MIL/uL — ABNORMAL LOW (ref 4.22–5.81)
RDW: 13.7 % (ref 11.5–15.5)
WBC Count: 6.1 10*3/uL (ref 4.0–10.5)
nRBC: 0 % (ref 0.0–0.2)

## 2022-05-07 LAB — TSH: TSH: 24.697 u[IU]/mL — ABNORMAL HIGH (ref 0.350–4.500)

## 2022-05-07 MED ORDER — INSULIN ASPART 100 UNIT/ML IJ SOLN
10.0000 [IU] | Freq: Once | INTRAMUSCULAR | Status: AC
  Administered 2022-05-07: 10 [IU] via SUBCUTANEOUS
  Filled 2022-05-07 (×2): qty 1

## 2022-05-07 MED ORDER — SODIUM CHLORIDE 0.9 % IV SOLN
1500.0000 mg | Freq: Once | INTRAVENOUS | Status: AC
  Administered 2022-05-07: 1500 mg via INTRAVENOUS
  Filled 2022-05-07: qty 30

## 2022-05-07 MED ORDER — SODIUM CHLORIDE 0.9 % IV SOLN: Freq: Once | INTRAVENOUS | Status: AC

## 2022-05-07 NOTE — Patient Instructions (Signed)
Steps to Quit Smoking Smoking tobacco is the leading cause of preventable death. It can affect almost every organ in the body. Smoking puts you and people around you at risk for many serious, long-lasting (chronic) diseases. Quitting smoking can be hard, but it is one of the best things that you can do for your health. It is never too late to quit. Do not give up if you cannot quit the first time. Some people need to try many times to quit. Do your best to stick to your quit plan, and talk with your doctor if you have any questions or concerns. How do I get ready to quit? Pick a date to quit. Set a date within the next 2 weeks to give you time to prepare. Write down the reasons why you are quitting. Keep this list in places where you will see it often. Tell your family, friends, and co-workers that you are quitting. Their support is important. Talk with your doctor about the choices that may help you quit. Find out if your health insurance will pay for these treatments. Know the people, places, things, and activities that make you want to smoke (triggers). Avoid them. What first steps can I take to quit smoking? Throw away all cigarettes at home, at work, and in your car. Throw away the things that you use when you smoke, such as ashtrays and lighters. Clean your car. Empty the ashtray. Clean your home, including curtains and carpets. What can I do to help me quit smoking? Talk with your doctor about taking medicines and seeing a counselor. You are more likely to succeed when you do both. If you are pregnant or breastfeeding: Talk with your doctor about counseling or other ways to quit smoking. Do not take medicine to help you quit smoking unless your doctor tells you to. Quit right away Quit smoking completely, instead of slowly cutting back on how much you smoke over a period of time. Stopping smoking right away may be more successful than slowly quitting. Go to counseling. In-person is best  if this is an option. You are more likely to quit if you go to counseling sessions regularly. Take medicine You may take medicines to help you quit. Some medicines need a prescription, and some you can buy over-the-counter. Some medicines may contain a drug called nicotine to replace the nicotine in cigarettes. Medicines may: Help you stop having the desire to smoke (cravings). Help to stop the problems that come when you stop smoking (withdrawal symptoms). Your doctor may ask you to use: Nicotine patches, gum, or lozenges. Nicotine inhalers or sprays. Non-nicotine medicine that you take by mouth. Find resources Find resources and other ways to help you quit smoking and remain smoke-free after you quit. They include: Online chats with a counselor. Phone quitlines. Printed self-help materials. Support groups or group counseling. Text messaging programs. Mobile phone apps. Use apps on your mobile phone or tablet that can help you stick to your quit plan. Examples of free services include Quit Guide from the CDC and smokefree.gov  What can I do to make it easier to quit?  Talk to your family and friends. Ask them to support and encourage you. Call a phone quitline, such as 1-800-QUIT-NOW, reach out to support groups, or work with a counselor. Ask people who smoke to not smoke around you. Avoid places that make you want to smoke, such as: Bars. Parties. Smoke-break areas at work. Spend time with people who do not smoke. Lower   the stress in your life. Stress can make you want to smoke. Try these things to lower stress: Getting regular exercise. Doing deep-breathing exercises. Doing yoga. Meditating. What benefits will I see if I quit smoking? Over time, you may have: A better sense of smell and taste. Less coughing and sore throat. A slower heart rate. Lower blood pressure. Clearer skin. Better breathing. Fewer sick days. Summary Quitting smoking can be hard, but it is one of  the best things that you can do for your health. Do not give up if you cannot quit the first time. Some people need to try many times to quit. When you decide to quit smoking, make a plan to help you succeed. Quit smoking right away, not slowly over a period of time. When you start quitting, get help and support to keep you smoke-free. This information is not intended to replace advice given to you by your health care provider. Make sure you discuss any questions you have with your health care provider. Document Revised: 12/06/2021 Document Reviewed: 12/06/2021 Elsevier Patient Education  2023 Elsevier Inc.  

## 2022-05-07 NOTE — Progress Notes (Signed)
?    Paul [name] ?[phone]   [phone] ? ?OFFICE PROGRESS NOTE ? ?Clinic, [name] ?[address] ?[name] [address] ? ?DIAGNOSIS: Stage IIIB (T3b, N2, M0) non-small cell lung cancer, adenocarcinoma presented with large right lower lobe lung mass and suspicious right lower paratracheal lymphadenopathy diagnosed in June 2022. ? ?Molecular studies by [facility] showed no actionable mutations ? ?PRIOR THERAPY:  ?1) Neoadjuvant systemic chemotherapy according to the Checkmate 816 with carboplatin for AUC of 5, Alimta 500 Mg/M2 and nivolumab 360 Mg IV every 3 weeks for 3 cycles.  Last dose 09/24/21.  Status post 3 cycles.  ?2) Concurrent chemoradiation with carboplatin for an AUC of 2 and paclitaxel 45 mg per metered square.  First dose expected on 11/04/2021.  Status post 7 cycles. ?  ?CURRENT THERAPY: Consolidation treatment with immunotherapy with Imfinzi 1500 Mg IV every 4 weeks.  First dose February 12, 2022.  Status post 3 cycles. ? ?INTERVAL HISTORY: ?Paul [name] 76 y.o. male returns to the clinic today for follow-up visit.  The patient is feeling fine today with no concerning complaints except for knee arthralgia.  He denied having any chest pain, shortness of breath, cough or hemoptysis.  He has no nausea, vomiting, diarrhea or constipation.  He has no headache or visual changes.  He denied having any significant weight loss or night sweats.  He has been tolerating his treatment with consolidation immunotherapy with Imfinzi fairly well.  The patient is here today for evaluation before starting cycle #4 with repeat CT scan of the chest for restaging of his disease. ? ?MEDICAL HISTORY: ?Past Medical History:  ?Diagnosis Date  ? CHF (congestive heart failure) ([place])   ? Coronary artery disease   ? Diabetes mellitus without complication ([name])   ? History of radiation therapy   ? Right lung- 11/27/21-01/10/22- Dr. Gery [name]  ? Hypertension    ? Hypothyroidism   ? Ischemic cardiomyopathy   ? Pneumonia   ? a long time ago  ? Sleep apnea   ? no longer uses a cpap  ? ? ?ALLERGIES:  has No Known Allergies. ? ?MEDICATIONS:  ?Current Outpatient Medications  ?Medication Sig Dispense Refill  ? ammonium lactate (LAC-HYDRIN) 12 % lotion APPLY AS DIRECTED TO AFFECTED AREA TWICE A DAY    ? aspirin 81 MG chewable tablet Take 81 mg by mouth daily.    ? blood glucose meter kit and supplies KIT Dispense based on patient and insurance preference. Use up to four times daily as directed. 1 each 0  ? carvedilol (COREG) 25 MG tablet Take 25 mg by mouth at bedtime.    ? folic acid (FOLVITE) 1 MG tablet Take 1 tablet (1 mg total) by mouth daily. 30 tablet 4  ? furosemide (LASIX) 20 MG tablet Take 20 mg by mouth at bedtime.    ? insulin glargine (LANTUS) 100 UNIT/ML Solostar Pen Inject 10 Units into the skin daily. 5 mL 5  ? levothyroxine (SYNTHROID) 50 MCG tablet Take 1 tablet (50 mcg total) by mouth daily before breakfast. 30 tablet 2  ? lidocaine-prilocaine (EMLA) cream Apply 1 application topically as needed. 30 g 2  ? Menthol-Methyl Salicylate (MUSCLE RUB) 10-15 % CREA Apply 1 application topically daily as needed for muscle pain.    ? Multiple Vitamins-Minerals (MULTIVITAMIN WITH MINERALS) tablet Take 1 tablet by mouth daily.    ? omeprazole (PRILOSEC) 20 MG capsule Take 20 mg by mouth daily.    ?  sacubitril-valsartan (ENTRESTO) 49-51 MG Take 0.5 tablets by mouth 2 (two) times daily.    ? urea (CARMOL) 20 % cream APPLY THIN LAYER TO AFFECTED AREA TWICE A DAY    ? chlorpheniramine-HYDROcodone (TUSSIONEX PENNKINETIC ER) 10-8 MG/5ML Take 5 mLs by mouth every 12 (twelve) hours as needed for cough. (Patient not taking: Reported on 05/07/2022) 115 mL 0  ? metFORMIN (GLUCOPHAGE) 500 MG tablet Take 1 tablet (500 mg total) by mouth 2 (two) times daily with a meal. 60 tablet 3  ? nitroGLYCERIN (NITROSTAT) 0.4 MG SL tablet Place 0.4 mg under the tongue every 5 (five) minutes as  needed for chest pain. (Patient not taking: Reported on 05/07/2022)    ? prochlorperazine (COMPAZINE) 10 MG tablet Take 1 tablet (10 mg total) by mouth every 6 (six) hours as needed for nausea or vomiting. (Patient not taking: Reported on 03/11/2022) 30 tablet 0  ? ?No current facility-administered medications for this visit.  ? ? ?SURGICAL HISTORY:  ?Past Surgical History:  ?Procedure Laterality Date  ? BRONCHIAL BIOPSY  06/04/2021  ? Procedure: BRONCHIAL BIOPSIES;  Surgeon: Garner Nash, DO;  Location: [facility];  Service: Pulmonary;;  ? BRONCHIAL BRUSHINGS  06/04/2021  ? Procedure: BRONCHIAL BRUSHINGS;  Surgeon: Garner Nash, DO;  Location: [facility];  Service: Pulmonary;;  ? BRONCHIAL NEEDLE ASPIRATION BIOPSY  06/04/2021  ? Procedure: BRONCHIAL NEEDLE ASPIRATION BIOPSIES;  Surgeon: Garner Nash, DO;  Location: [facility];  Service: Pulmonary;;  ? BRONCHIAL WASHINGS  06/04/2021  ? Procedure: BRONCHIAL WASHINGS;  Surgeon: Garner Nash, DO;  Location: [facility];  Service: Pulmonary;;  ? CARDIAC SURGERY    ? COLONOSCOPY    ? CORONARY ARTERY BYPASS GRAFT    ? 2019 at [facility]  ? VIDEO BRONCHOSCOPY WITH ENDOBRONCHIAL NAVIGATION Right 06/04/2021  ? Procedure: VIDEO BRONCHOSCOPY WITH ENDOBRONCHIAL NAVIGATION;  Surgeon: Garner Nash, DO;  Location: [facility];  Service: Pulmonary;  Laterality: Right;  ? VIDEO BRONCHOSCOPY WITH ENDOBRONCHIAL ULTRASOUND  06/04/2021  ? Procedure: VIDEO BRONCHOSCOPY WITH ENDOBRONCHIAL ULTRASOUND;  Surgeon: Garner Nash, DO;  Location: [facility];  Service: Pulmonary;;  ? ? ?REVIEW OF SYSTEMS:  Constitutional: positive for fatigue ?Eyes: negative ?Ears, nose, mouth, throat, and face: negative ?Respiratory: positive for dyspnea on exertion ?Cardiovascular: negative ?Gastrointestinal: negative ?Genitourinary:negative ?Integument/breast: negative ?Hematologic/lymphatic: negative ?Musculoskeletal:positive for arthralgias ?Neurological: negative ?Behavioral/Psych:  negative ?Endocrine: negative ?Allergic/Immunologic: negative  ? ?PHYSICAL EXAMINATION: General appearance: alert, cooperative, fatigued, and no distress ?Head: Normocephalic, without obvious abnormality, atraumatic ?Neck: no adenopathy, no JVD, supple, symmetrical, trachea midline, and thyroid not enlarged, symmetric, no tenderness/mass/nodules ?Lymph nodes: Cervical, supraclavicular, and axillary nodes normal. ?Resp: clear to auscultation bilaterally ?Back: symmetric, no curvature. ROM normal. No CVA tenderness. ?Cardio: regular rate and rhythm, S1, S2 normal, no murmur, click, rub or gallop ?GI: soft, non-tender; bowel sounds normal; no masses,  no organomegaly ?Extremities: extremities normal, atraumatic, no cyanosis or edema ?Neurologic: Alert and oriented X 3, normal strength and tone. Normal symmetric reflexes. Normal coordination and gait ? ?ECOG PERFORMANCE STATUS: 1 - Symptomatic but completely ambulatory ? ?Blood pressure (!) 174/92, pulse 97, temperature (!) 96 ?F (35.6 ?C), temperature source Tympanic, resp. rate 17, weight 252 lb (114.3 kg), SpO2 94 %. ? ?LABORATORY DATA: ?Lab Results  ?Component Value Date  ? WBC 6.1 05/07/2022  ? HGB 13.1 05/07/2022  ? HCT 39.9 05/07/2022  ? MCV 98.5 05/07/2022  ? PLT 211 05/07/2022  ? ? ?  Chemistry   ?   ?Component  Value Date/Time  ? NA 137 05/07/2022 0944  ? K 4.1 05/07/2022 0944  ? CL 104 05/07/2022 0944  ? CO2 26 05/07/2022 0944  ? BUN 14 05/07/2022 0944  ? CREATININE 1.34 (H) 05/07/2022 0944  ?    ?Component Value Date/Time  ? CALCIUM 9.3 05/07/2022 0944  ? ALKPHOS 138 (H) 05/07/2022 0944  ? AST 20 05/07/2022 0944  ? ALT 10 05/07/2022 0944  ? BILITOT 0.4 05/07/2022 0944  ?  ? ? ? ?RADIOGRAPHIC STUDIES: ?CT Chest W Contrast ? ?Result Date: 05/07/2022 ?CLINICAL DATA:  Primary Cancer Type: Non-small cancer. Imaging Indication: Assess response to therapy Interval therapy since last imaging? Yes Initial Cancer Diagnosis Date: 06/04/2021; Established by: Biopsy-proven  Detailed Pathology: Stage IIIB non-small cell lung cancer, adenocarcinoma. Primary Tumor location:  Right lower lobe. Surgeries: CABG 2019. Chemotherapy: Yes; Ongoing? No; Most recent administration: 01/05/2

## 2022-05-07 NOTE — Patient Instructions (Signed)
[facility]  Discharge Instructions: °Thank you for choosing [facility] to provide your oncology and hematology care.  ° °If you have a lab appointment with the Cancer Center, please go directly to the Cancer Center and check in at the registration area. °  °Wear comfortable clothing and clothing appropriate for easy access to any Portacath or PICC line.  ° °We strive to give you quality time with your provider. You may need to reschedule your appointment if you arrive late (15 or more minutes).  Arriving late affects you and other patients whose appointments are after yours.  Also, if you miss three or more appointments without notifying the office, you may be dismissed from the clinic at the provider’s discretion.    °  °For prescription refill requests, have your pharmacy contact our office and allow 72 hours for refills to be completed.   ° °Today you received the following chemotherapy and/or immunotherapy agents :  Durvalumab °    °  °To help prevent nausea and vomiting after your treatment, we encourage you to take your nausea medication as directed. ° °BELOW ARE SYMPTOMS THAT SHOULD BE REPORTED IMMEDIATELY: °*FEVER GREATER THAN 100.4 F (38 °C) OR HIGHER °*CHILLS OR SWEATING °*NAUSEA AND VOMITING THAT IS NOT CONTROLLED WITH YOUR NAUSEA MEDICATION °*UNUSUAL SHORTNESS OF BREATH °*UNUSUAL BRUISING OR BLEEDING °*URINARY PROBLEMS (pain or burning when urinating, or frequent urination) °*BOWEL PROBLEMS (unusual diarrhea, constipation, pain near the anus) °TENDERNESS IN MOUTH AND THROAT WITH OR WITHOUT PRESENCE OF ULCERS (sore throat, sores in mouth, or a toothache) °UNUSUAL RASH, SWELLING OR PAIN  °UNUSUAL VAGINAL DISCHARGE OR ITCHING  ° °Items with * indicate a potential emergency and should be followed up as soon as possible or go to the Emergency Department if any problems should occur. ° °Please show the CHEMOTHERAPY ALERT CARD or IMMUNOTHERAPY ALERT CARD at  check-in to the Emergency Department and triage nurse. ° °Should you have questions after your visit or need to cancel or reschedule your appointment, please contact [facility]  Dept: [phone]  and follow the prompts.  Office hours are 8:00 a.m. to 4:30 p.m. Monday - Friday. Please note that voicemails left after 4:00 p.m. may not be returned until the following business day.  We are closed weekends and major holidays. You have access to a nurse at all times for urgent questions. Please call the main number to the clinic Dept: [phone] and follow the prompts. ° ° °For any non-urgent questions, you may also contact your provider using MyChart. We now offer e-Visits for anyone 18 and older to request care online for non-urgent symptoms. For details visit [email or web address]. °  °Also download the MyChart app! Go to the app store, search "MyChart", open the app, select , and log in with your MyChart username and password. ° °Due to Covid, a mask is required upon entering the hospital/clinic. If you do not have a mask, one will be given to you upon arrival. For doctor visits, patients may have 1 support person aged 18 or older with them. For treatment visits, patients cannot have anyone with them due to current Covid guidelines and our immunocompromised population.  ° °

## 2022-05-08 ENCOUNTER — Other Ambulatory Visit: Payer: Self-pay | Admitting: Internal Medicine

## 2022-05-08 ENCOUNTER — Telehealth: Payer: Self-pay

## 2022-05-08 DIAGNOSIS — E039 Hypothyroidism, unspecified: Secondary | ICD-10-CM

## 2022-05-08 MED ORDER — LEVOTHYROXINE SODIUM 75 MCG PO TABS: 75.0000 ug | ORAL_TABLET | Freq: Every day | ORAL | 1 refills | Status: DC

## 2022-05-08 NOTE — Telephone Encounter (Signed)
I spoke with pt and advised as indicated. Pt repeated the information back and expressed understanding. ?

## 2022-05-08 NOTE — Telephone Encounter (Signed)
-----   Message from Curt Bears, MD sent at 05/08/2022  8:06 AM EDT ----- ?Please let the patient know that I increased his levothyroxine dose to 75 mcg p.o. daily.  He can pick up new prescription from his [facility] in [place] or use 1.5 tablet from his current 50 mcg tablet at home for now.  Thank you ?----- Message ----- ?From: Interface, Lab In [facility] ?Sent: 05/07/2022  10:02 AM EDT ?To: Curt Bears, MD ? ? ?

## 2022-05-30 NOTE — Progress Notes (Signed)
[place] [address]  DIAGNOSIS: Stage IIIB (T3b, N2, M0) non-small cell lung cancer, adenocarcinoma presented with large right lower lobe lung mass and suspicious right lower paratracheal lymphadenopathy diagnosed in June 2022.   Molecular studies by [employer] 360 showed no actionable mutations  PRIOR THERAPY: 1) Neoadjuvant systemic chemotherapy according to the Checkmate 816 with carboplatin for AUC of 5, Alimta 500 Mg/M2 and nivolumab 360 Mg IV every 3 weeks for 3 cycles.  Last dose 09/24/21.  Status post 3 cycles 2) 2) Concurrent chemoradiation with carboplatin for an AUC of 2 and paclitaxel 45 mg per metered square.  First dose expected on 11/04/2021.  Status post 7 cycles.  CURRENT THERAPY: Consolidation treatment with immunotherapy with Imfinzi 1500 Mg IV every 4 weeks.  First dose February 12, 2022.  Status post 4 cycles.  INTERVAL HISTORY: Paul [name] 76 y.o. male returns to the clinic today for a follow-up visit.  The patient is feeling fairly well today without any concerning complaints except for la cough for several weeks.  He believes this was present even prior to his last appointment in which he had a restaging scan at that time which showed some bronchiectasis, architectural distortion, consolidative opacity in the paraspinal right lower lobe and infrahilar left lower lobe which is similar to prior.  The patient has not been taking anything for cough.  He was prescribed Tussionex in March 2023 but the [place] do not have this in stock.  He would be willing to try something else for cough.  He coughs up off-white mucus.  Denies any associated symptoms such as sore throat, sick contacts, or fevers.  He still smoking half a pack of cigarettes per day.  The patient is currently undergoing consolidation immunotherapy with Imfinzi.  He is status post 4 cycles and is tolerated well  without any concerning adverse side effects.  We are monitoring his thyroid closely and adjusting Synthroid if needed.  His Synthroid was adjusted a few weeks ago.  He states he is taking this as prescribed.  Today he denies any fever, chills, night sweats, or unexplained weight loss.  He denies any chest pain or hemoptysis.  He denies any nausea, vomiting, constipation, or diarrhea.  He denies any rashes or skin changes. No headaches or vision changes.  He is here today for evaluation and repeat blood work before starting cycle #5.   MEDICAL HISTORY: Past Medical History:  Diagnosis Date   CHF (congestive heart failure) ([place])    Coronary artery disease    Diabetes mellitus without complication ([place])    History of radiation therapy    Right lung- 11/27/21-01/10/22- Dr. [name]   Hypertension    Hypothyroidism    Ischemic cardiomyopathy    Pneumonia    a long time ago   Sleep apnea    no longer uses a cpap    ALLERGIES:  has No Known Allergies.  MEDICATIONS:  Current Outpatient Medications  Medication Sig Dispense Refill   ammonium lactate (LAC-HYDRIN) 12 % lotion APPLY AS DIRECTED TO AFFECTED AREA TWICE A DAY     aspirin 81 MG chewable tablet Take 81 mg by mouth daily.     benzonatate (TESSALON) 100 MG capsule Take 1 capsule (100 mg total) by mouth 3 (three) times daily as needed for cough. 30 capsule 2   blood glucose meter kit and supplies KIT Dispense based on patient and insurance preference. Use  up to four times daily as directed. 1 each 0   carvedilol (COREG) 25 MG tablet Take 25 mg by mouth at bedtime.     folic acid (FOLVITE) 1 MG tablet Take 1 tablet (1 mg total) by mouth daily. 30 tablet 4   furosemide (LASIX) 20 MG tablet Take 20 mg by mouth at bedtime.     insulin glargine (LANTUS) 100 UNIT/ML Solostar Pen Inject 10 Units into the skin daily. 5 mL 5   levothyroxine (SYNTHROID) 75 MCG tablet Take 1 tablet (75 mcg total) by mouth daily before breakfast. 30 tablet 1    lidocaine-prilocaine (EMLA) cream Apply 1 application topically as needed. 30 g 2   Menthol-Methyl Salicylate (MUSCLE RUB) 10-15 % CREA Apply 1 application topically daily as needed for muscle pain.     Multiple Vitamins-Minerals (MULTIVITAMIN WITH MINERALS) tablet Take 1 tablet by mouth daily.     omeprazole (PRILOSEC) 20 MG capsule Take 20 mg by mouth daily.     sacubitril-valsartan (ENTRESTO) 49-51 MG Take 0.5 tablets by mouth 2 (two) times daily.     urea (CARMOL) 20 % cream APPLY THIN LAYER TO AFFECTED AREA TWICE A DAY     chlorpheniramine-HYDROcodone (TUSSIONEX PENNKINETIC ER) 10-8 MG/5ML Take 5 mLs by mouth every 12 (twelve) hours as needed for cough. (Patient not taking: Reported on 05/07/2022) 115 mL 0   metFORMIN (GLUCOPHAGE) 500 MG tablet Take 1 tablet (500 mg total) by mouth 2 (two) times daily with a meal. 60 tablet 3   nitroGLYCERIN (NITROSTAT) 0.4 MG SL tablet Place 0.4 mg under the tongue every 5 (five) minutes as needed for chest pain. (Patient not taking: Reported on 05/07/2022)     prochlorperazine (COMPAZINE) 10 MG tablet Take 1 tablet (10 mg total) by mouth every 6 (six) hours as needed for nausea or vomiting. (Patient not taking: Reported on 03/11/2022) 30 tablet 0   No current facility-administered medications for this visit.    SURGICAL HISTORY:  Past Surgical History:  Procedure Laterality Date   BRONCHIAL BIOPSY  06/04/2021   Procedure: BRONCHIAL BIOPSIES;  Surgeon: Garner Nash, DO;  Location: [facility];  Service: Pulmonary;;   BRONCHIAL BRUSHINGS  06/04/2021   Procedure: BRONCHIAL BRUSHINGS;  Surgeon: Garner Nash, DO;  Location: [facility];  Service: Pulmonary;;   BRONCHIAL NEEDLE ASPIRATION BIOPSY  06/04/2021   Procedure: BRONCHIAL NEEDLE ASPIRATION BIOPSIES;  Surgeon: Garner Nash, DO;  Location: [facility];  Service: Pulmonary;;   BRONCHIAL WASHINGS  06/04/2021   Procedure: BRONCHIAL WASHINGS;  Surgeon: Garner Nash, DO;  Location: [facility];   Service: Pulmonary;;   CARDIAC SURGERY     COLONOSCOPY     CORONARY ARTERY BYPASS GRAFT     2019 at [place] Right 06/04/2021   Procedure: [place];  Surgeon: Garner Nash, DO;  Location: [facility];  Service: Pulmonary;  Laterality: Right;   VIDEO BRONCHOSCOPY WITH ENDOBRONCHIAL ULTRASOUND  06/04/2021   Procedure: VIDEO BRONCHOSCOPY WITH ENDOBRONCHIAL ULTRASOUND;  Surgeon: Garner Nash, DO;  Location: [facility];  Service: Pulmonary;;    REVIEW OF SYSTEMS:   Review of Systems  Constitutional: Negative for appetite change, chills, fatigue, fever and unexpected weight change.  HENT: Negative for mouth sores, nosebleeds, sore throat and trouble swallowing.   Eyes: Negative for eye problems and icterus.  Respiratory: Positive for cough.  Positive for similar dyspnea on exertion.  Negative for hemoptysis and wheezing.  Cardiovascular: Negative for chest pain and leg swelling.  Gastrointestinal: Negative for abdominal pain, constipation, diarrhea, nausea and vomiting.  Genitourinary: Negative for bladder incontinence, difficulty urinating, dysuria, frequency and hematuria.   Musculoskeletal: Negative for back pain, gait problem, neck pain and neck stiffness.  Skin: Negative for itching and rash.  Neurological: Negative for dizziness, extremity weakness, gait problem, headaches, light-headedness and seizures.  Hematological: Negative for adenopathy. Does not bruise/bleed easily.  Psychiatric/Behavioral: Negative for confusion, depression and sleep disturbance. The patient is not nervous/anxious.     PHYSICAL EXAMINATION:  Blood pressure 125/63, pulse 89, temperature 97.9 F (36.6 C), temperature source Oral, resp. rate 20, weight 248 lb 8 oz (112.7 kg), SpO2 96 %.  ECOG PERFORMANCE STATUS: 1  Physical Exam  Constitutional: Oriented to person, place, and time and well-developed,  well-nourished, and in no distress.  HENT:  Head: Normocephalic and atraumatic.  Mouth/Throat: Oropharynx is clear and moist. No oropharyngeal exudate.  Eyes: Conjunctivae are normal. Right eye exhibits no discharge. Left eye exhibits no discharge. No scleral icterus.  Neck: Normal range of motion. Neck supple.  Cardiovascular: Normal rate, regular rhythm, normal heart sounds and intact distal pulses.   Pulmonary/Chest: Effort normal and breath sounds normal. No respiratory distress.  Positive for some rhonchi which improves with coughing.  Decreased breath sounds in the right lower lobe.  No rales.  Abdominal: Soft. Bowel sounds are normal. Exhibits no distension and no mass. There is no tenderness.  Musculoskeletal: Normal range of motion. Exhibits no edema.  Lymphadenopathy:    No cervical adenopathy.  Neurological: Alert and oriented to person, place, and time. Exhibits normal muscle tone. Gait normal. Coordination normal.  Skin: Skin is warm and dry. No rash noted. Not diaphoretic. No erythema. No pallor.  Psychiatric: Mood, memory and judgment normal.  Vitals reviewed.  LABORATORY DATA: Lab Results  Component Value Date   WBC 6.0 06/04/2022   HGB 13.2 06/04/2022   HCT 39.8 06/04/2022   MCV 96.4 06/04/2022   PLT 198 06/04/2022      Chemistry      Component Value Date/Time   NA 137 05/07/2022 0944   K 4.1 05/07/2022 0944   CL 104 05/07/2022 0944   CO2 26 05/07/2022 0944   BUN 14 05/07/2022 0944   CREATININE 1.34 (H) 05/07/2022 0944      Component Value Date/Time   CALCIUM 9.3 05/07/2022 0944   ALKPHOS 138 (H) 05/07/2022 0944   AST 20 05/07/2022 0944   ALT 10 05/07/2022 0944   BILITOT 0.4 05/07/2022 0944       RADIOGRAPHIC STUDIES:  CT Chest W Contrast  Result Date: 05/07/2022 CLINICAL DATA:  Primary Cancer Type: Non-small cancer. Imaging Indication: Assess response to therapy Interval therapy since last imaging? Yes Initial Cancer Diagnosis Date: 06/04/2021;  Established by: Biopsy-proven Detailed Pathology: Stage IIIB non-small cell lung cancer, adenocarcinoma. Primary Tumor location:  Right lower lobe. Surgeries: CABG 2019. Chemotherapy: Yes; Ongoing? No; Most recent administration: 01/02/2022 Immunotherapy?  Yes; Type: Imfinzi; Ongoing? Yes Radiation therapy? Yes; Date Range: 11/27/2021 - 01/10/2022; Target: Right lung EXAM: CT CHEST WITH CONTRAST TECHNIQUE: Multidetector CT imaging of the chest was performed during intravenous contrast administration. RADIATION DOSE REDUCTION: This exam was performed according to the departmental dose-optimization program which includes automated exposure control, adjustment of the mA and/or kV according to patient size and/or use of iterative reconstruction technique. CONTRAST:  58m OMNIPAQUE IOHEXOL 300 MG/ML  SOLN COMPARISON:  Most recent CT chest 01/31/2022. 05/31/2021 PET-CT. FINDINGS: Cardiovascular: The  heart size is normal. No substantial pericardial effusion. Coronary artery calcification is evident. Status post CABG. Advanced atherosclerosis noted in the thoracic aorta. Mediastinum/Nodes: No mediastinal lymphadenopathy. There is no hilar lymphadenopathy. The esophagus has normal imaging features. There is no axillary lymphadenopathy. Lungs/Pleura: Bronchiectasis with chronic interstitial and consolidative airspace opacity in the posterior right lower lung is similar to prior and presumably treatment related. Adjacent 2.6 cm spiculated opacity on 92/7 is stable. Small right pleural effusion is mildly progressive in the interval. Left lower lobe infrahilar scarring is similar to prior. No left pleural effusion. No new suspicious nodule or mass in the left lung. Upper Abdomen: Unremarkable. Musculoskeletal: No worrisome lytic or sclerotic osseous abnormality. IMPRESSION: 1. Slight interval progression of small right pleural effusion. 2. Aggressive appearing lesion in the posterior right lower lung measured previously at 3.3  cm is 2.6 cm today with similar imaging features. Close attention on follow-up recommended. 3. Areas of bronchiectasis, architectural distortion, and consolidative opacity in the paraspinal right lower lobe and infrahilar left lower lobe, similar to prior. 4.  Emphysema (ICD10-J43.9) and Aortic Atherosclerosis (ICD10-170.0) Electronically Signed   By: Misty Stanley M.D.   On: 05/07/2022 10:56     ASSESSMENT/PLAN:  This is a very pleasant 76 year old African-American male diagnosed with stage IIIb (T3b, N2, M0) non-small cell lung cancer, adenocarcinoma.  He presented with a large right upper lobe lung mass and suspicious right lower lobe paratracheal lymphadenopathy.  He was diagnosed in June 2022.  His molecular studies by guardant 360 were negative for any actionable mutations.   The patient completed 3 cycles of neoadjuvant chemotherapy according to the Checkmate 816 trial with carboplatin for an AUC of 5, Alimta 500 mg per metered square, and nivolumab 360 mg IV every 3 weeks.  The patient status post 3 cycles.  His last dose was on 09/24/2021.  Upon reimaging after completion of this course, there was not significant improvement in the size of his mass and therefore, it does not appear that we are able to downstage his disease to make him eligible for surgical resection.  He completed a course of concurrent chemoradiation with weekly carboplatin for AUC of 2 and paclitaxel 45 Mg/M2 status post 7 cycles.  Last cycle was given January 02, 2022.     The patient is currently on consolidation treatment with immunotherapy with Imfinzi 1500 Mg IV every 4 weeks status post 4 cycles.  He tolerated the first cycle of his treatment well.    Labs were reviewed.  Recommend that he proceed cycle #5 today scheduled.  We will see him back for follow-up visit in 4 weeks for evaluation to review his scan results before starting cycle #6.   Patient was previously prescribed Tussionex for his cough.  The [facility] pharmacy  does not keep this in stock.  Regarding his cough today, the patient does not have any leukocytosis or hypoxia.  The cough is been stable for the last several weeks but still concerning for the patient.  His last scan noted stable and similar appearing bronchiectasis, architectural distortion, consolidative opacity in the right lower lobe and infrahilar lower lobe.  He also has a small right pleural effusion on his last scan.  Discussed with Dr. Julien Nordmann today.  I will arrange for chest x-ray today to ensure no interval development of any infection.  I sent him a prescription for Tessalon Perles.  If he does not pick up this medication, he can also take an over-the-counter cough medicine such as  Delsym or Robitussin.  Encouraged him to quit smoking as this is likely exacerbating his cough.  I also encouraged him to use Mucinex for his secretions.  If he is not having improvement in his cough with the cough medication, we will consider prescribing him a Medrol Dosepak next week.  He denies any systemic symptoms such as fevers, sore throat, nasal congestion, sick contacts, or worsening shortness of breath.  I will personally call the patient with the results of his x-ray.  We will continue to monitor his TSH closely and make dose adjustments of his Synthroid if needed.  His TSH from today is pending at this time.  The patient was advised to call immediately if he has any concerning symptoms in the interval. The patient voices understanding of current disease status and treatment options and is in agreement with the current care plan. All questions were answered. The patient knows to call the clinic with any problems, questions or concerns. We can certainly see the patient much sooner if necessary            Orders Placed This Encounter  Procedures   DG Chest 2 View    Standing Status:   Future    Standing Expiration Date:   06/04/2023    Order Specific Question:   Reason for Exam (SYMPTOM  OR  DIAGNOSIS REQUIRED)    Answer:   Lung cancer and completed chemo/radiation. Complaining of stable cough. Rule out other etiologies such as infection.    Order Specific Question:   Preferred imaging location?    Answer:   [facility]     The total time spent in the appointment was 30-39 minutes.   Orson Rho L Jakeline Dave, PA-C 06/04/22

## 2022-06-04 ENCOUNTER — Inpatient Hospital Stay (HOSPITAL_BASED_OUTPATIENT_CLINIC_OR_DEPARTMENT_OTHER): Payer: No Typology Code available for payment source | Admitting: Physician Assistant

## 2022-06-04 ENCOUNTER — Inpatient Hospital Stay: Payer: No Typology Code available for payment source

## 2022-06-04 ENCOUNTER — Other Ambulatory Visit: Payer: Self-pay

## 2022-06-04 ENCOUNTER — Inpatient Hospital Stay: Payer: No Typology Code available for payment source | Attending: Internal Medicine

## 2022-06-04 VITALS — BP 125/63 | HR 89 | Temp 97.9°F | Resp 20 | Wt 248.5 lb

## 2022-06-04 DIAGNOSIS — C3431 Malignant neoplasm of lower lobe, right bronchus or lung: Secondary | ICD-10-CM | POA: Insufficient documentation

## 2022-06-04 DIAGNOSIS — C3491 Malignant neoplasm of unspecified part of right bronchus or lung: Secondary | ICD-10-CM

## 2022-06-04 DIAGNOSIS — R059 Cough, unspecified: Secondary | ICD-10-CM

## 2022-06-04 DIAGNOSIS — Z79899 Other long term (current) drug therapy: Secondary | ICD-10-CM | POA: Diagnosis not present

## 2022-06-04 DIAGNOSIS — Z5112 Encounter for antineoplastic immunotherapy: Secondary | ICD-10-CM | POA: Insufficient documentation

## 2022-06-04 LAB — CBC WITH DIFFERENTIAL (CANCER CENTER ONLY)
Abs Immature Granulocytes: 0.02 10*3/uL (ref 0.00–0.07)
Basophils Absolute: 0.1 10*3/uL (ref 0.0–0.1)
Basophils Relative: 1 %
Eosinophils Absolute: 0.1 10*3/uL (ref 0.0–0.5)
Eosinophils Relative: 2 %
HCT: 39.8 % (ref 39.0–52.0)
Hemoglobin: 13.2 g/dL (ref 13.0–17.0)
Immature Granulocytes: 0 %
Lymphocytes Relative: 15 %
Lymphs Abs: 0.9 10*3/uL (ref 0.7–4.0)
MCH: 32 pg (ref 26.0–34.0)
MCHC: 33.2 g/dL (ref 30.0–36.0)
MCV: 96.4 fL (ref 80.0–100.0)
Monocytes Absolute: 0.7 10*3/uL (ref 0.1–1.0)
Monocytes Relative: 12 %
Neutro Abs: 4.2 10*3/uL (ref 1.7–7.7)
Neutrophils Relative %: 70 %
Platelet Count: 198 10*3/uL (ref 150–400)
RBC: 4.13 MIL/uL — ABNORMAL LOW (ref 4.22–5.81)
RDW: 13.9 % (ref 11.5–15.5)
WBC Count: 6 10*3/uL (ref 4.0–10.5)
nRBC: 0 % (ref 0.0–0.2)

## 2022-06-04 LAB — CMP (CANCER CENTER ONLY)
ALT: 12 U/L (ref 0–44)
AST: 15 U/L (ref 15–41)
Albumin: 4 g/dL (ref 3.5–5.0)
Alkaline Phosphatase: 147 U/L — ABNORMAL HIGH (ref 38–126)
Anion gap: 6 (ref 5–15)
BUN: 15 mg/dL (ref 8–23)
CO2: 26 mmol/L (ref 22–32)
Calcium: 9.5 mg/dL (ref 8.9–10.3)
Chloride: 106 mmol/L (ref 98–111)
Creatinine: 1.26 mg/dL — ABNORMAL HIGH (ref 0.61–1.24)
GFR, Estimated: 59 mL/min — ABNORMAL LOW (ref >60)
Glucose, Bld: 382 mg/dL — ABNORMAL HIGH (ref 70–99)
Potassium: 4.4 mmol/L (ref 3.5–5.1)
Sodium: 138 mmol/L (ref 135–145)
Total Bilirubin: 0.3 mg/dL (ref 0.3–1.2)
Total Protein: 7.1 g/dL (ref 6.5–8.1)

## 2022-06-04 LAB — TSH: TSH: 14.663 u[IU]/mL — ABNORMAL HIGH (ref 0.350–4.500)

## 2022-06-04 MED ORDER — SODIUM CHLORIDE 0.9 % IV SOLN
1500.0000 mg | Freq: Once | INTRAVENOUS | Status: AC
  Administered 2022-06-04: 1500 mg via INTRAVENOUS
  Filled 2022-06-04: qty 30

## 2022-06-04 MED ORDER — SODIUM CHLORIDE 0.9 % IV SOLN: Freq: Once | INTRAVENOUS | Status: AC

## 2022-06-04 MED ORDER — BENZONATATE 100 MG PO CAPS: 100.0000 mg | ORAL_CAPSULE | Freq: Three times a day (TID) | ORAL | 2 refills | Status: DC | PRN

## 2022-06-04 NOTE — Patient Instructions (Signed)
[facility]   Discharge Instructions: Thank you for choosing [facility] to provide your oncology and hematology care.   If you have a lab appointment with the Cancer Center, please go directly to the Cancer Center and check in at the registration area.   Wear comfortable clothing and clothing appropriate for easy access to any Portacath or PICC line.   We strive to give you quality time with your provider. You may need to reschedule your appointment if you arrive late (15 or more minutes).  Arriving late affects you and other patients whose appointments are after yours.  Also, if you miss three or more appointments without notifying the office, you may be dismissed from the clinic at the provider's discretion.      For prescription refill requests, have your pharmacy contact our office and allow 72 hours for refills to be completed.    Today you received the following chemotherapy and/or immunotherapy agents: durvalumab.      To help prevent nausea and vomiting after your treatment, we encourage you to take your nausea medication as directed.  BELOW ARE SYMPTOMS THAT SHOULD BE REPORTED IMMEDIATELY: *FEVER GREATER THAN 100.4 F (38 C) OR HIGHER *CHILLS OR SWEATING *NAUSEA AND VOMITING THAT IS NOT CONTROLLED WITH YOUR NAUSEA MEDICATION *UNUSUAL SHORTNESS OF BREATH *UNUSUAL BRUISING OR BLEEDING *URINARY PROBLEMS (pain or burning when urinating, or frequent urination) *BOWEL PROBLEMS (unusual diarrhea, constipation, pain near the anus) TENDERNESS IN MOUTH AND THROAT WITH OR WITHOUT PRESENCE OF ULCERS (sore throat, sores in mouth, or a toothache) UNUSUAL RASH, SWELLING OR PAIN  UNUSUAL VAGINAL DISCHARGE OR ITCHING   Items with * indicate a potential emergency and should be followed up as soon as possible or go to the Emergency Department if any problems should occur.  Please show the CHEMOTHERAPY ALERT CARD or IMMUNOTHERAPY ALERT CARD at check-in  to the Emergency Department and triage nurse.  Should you have questions after your visit or need to cancel or reschedule your appointment, please contact [facility]  Dept: [phone]  and follow the prompts.  Office hours are 8:00 a.m. to 4:30 p.m. Monday - Friday. Please note that voicemails left after 4:00 p.m. may not be returned until the following business day.  We are closed weekends and major holidays. You have access to a nurse at all times for urgent questions. Please call the main number to the clinic Dept: [phone] and follow the prompts.   For any non-urgent questions, you may also contact your provider using MyChart. We now offer e-Visits for anyone 18 and older to request care online for non-urgent symptoms. For details visit [email or web address].   Also download the MyChart app! Go to the app store, search "MyChart", open the app, select [facility], and log in with your MyChart username and password.  Due to Covid, a mask is required upon entering the hospital/clinic. If you do not have a mask, one will be given to you upon arrival. For doctor visits, patients may have 1 support person aged 18 or older with them. For treatment visits, patients cannot have anyone with them due to current Covid guidelines and our immunocompromised population.   

## 2022-07-02 ENCOUNTER — Inpatient Hospital Stay: Payer: No Typology Code available for payment source

## 2022-07-02 ENCOUNTER — Inpatient Hospital Stay: Payer: No Typology Code available for payment source | Admitting: Internal Medicine

## 2022-07-02 ENCOUNTER — Telehealth: Payer: Self-pay

## 2022-07-02 NOTE — Telephone Encounter (Signed)
TCT patient. He is  ot feeling well and is heading to PCP at [place]. Will send scheduling message to reschedule.

## 2022-07-04 ENCOUNTER — Telehealth: Payer: Self-pay | Admitting: Internal Medicine

## 2022-07-04 NOTE — Telephone Encounter (Signed)
.  Called patient to schedule appointment per 7/7 inbasket, patient is aware of date and time.

## 2022-07-07 ENCOUNTER — Telehealth: Payer: Self-pay

## 2022-07-07 ENCOUNTER — Other Ambulatory Visit: Payer: No Typology Code available for payment source

## 2022-07-07 ENCOUNTER — Ambulatory Visit: Payer: No Typology Code available for payment source | Admitting: Internal Medicine

## 2022-07-07 ENCOUNTER — Ambulatory Visit: Payer: No Typology Code available for payment source

## 2022-07-07 NOTE — Telephone Encounter (Signed)
Patient did not arrive for appointments today. Called and left message to call office back to reschedule. Patient missed 07/02/22 apt as well.

## 2022-07-09 ENCOUNTER — Observation Stay (HOSPITAL_COMMUNITY)
Admission: EM | Admit: 2022-07-09 | Discharge: 2022-07-10 | Disposition: A | Discharge status: Home / Self Care | Payer: No Typology Code available for payment source | Attending: Family Medicine | Admitting: Family Medicine

## 2022-07-09 ENCOUNTER — Encounter (HOSPITAL_COMMUNITY): Payer: Self-pay

## 2022-07-09 DIAGNOSIS — Z951 Presence of aortocoronary bypass graft: Secondary | ICD-10-CM | POA: Diagnosis not present

## 2022-07-09 DIAGNOSIS — I2510 Atherosclerotic heart disease of native coronary artery without angina pectoris: Secondary | ICD-10-CM | POA: Diagnosis not present

## 2022-07-09 DIAGNOSIS — I509 Heart failure, unspecified: Secondary | ICD-10-CM | POA: Insufficient documentation

## 2022-07-09 DIAGNOSIS — C3491 Malignant neoplasm of unspecified part of right bronchus or lung: Secondary | ICD-10-CM

## 2022-07-09 DIAGNOSIS — E1122 Type 2 diabetes mellitus with diabetic chronic kidney disease: Secondary | ICD-10-CM | POA: Insufficient documentation

## 2022-07-09 DIAGNOSIS — Z79899 Other long term (current) drug therapy: Secondary | ICD-10-CM | POA: Diagnosis not present

## 2022-07-09 DIAGNOSIS — Z794 Long term (current) use of insulin: Secondary | ICD-10-CM | POA: Diagnosis not present

## 2022-07-09 DIAGNOSIS — E860 Dehydration: Secondary | ICD-10-CM | POA: Insufficient documentation

## 2022-07-09 DIAGNOSIS — N1831 Chronic kidney disease, stage 3a: Secondary | ICD-10-CM | POA: Diagnosis not present

## 2022-07-09 DIAGNOSIS — I130 Hypertensive heart and chronic kidney disease with heart failure and stage 1 through stage 4 chronic kidney disease, or unspecified chronic kidney disease: Secondary | ICD-10-CM | POA: Insufficient documentation

## 2022-07-09 DIAGNOSIS — Z7982 Long term (current) use of aspirin: Secondary | ICD-10-CM | POA: Diagnosis not present

## 2022-07-09 DIAGNOSIS — E875 Hyperkalemia: Secondary | ICD-10-CM | POA: Insufficient documentation

## 2022-07-09 DIAGNOSIS — F17210 Nicotine dependence, cigarettes, uncomplicated: Secondary | ICD-10-CM | POA: Diagnosis not present

## 2022-07-09 DIAGNOSIS — E785 Hyperlipidemia, unspecified: Secondary | ICD-10-CM | POA: Insufficient documentation

## 2022-07-09 DIAGNOSIS — E876 Hypokalemia: Secondary | ICD-10-CM | POA: Insufficient documentation

## 2022-07-09 DIAGNOSIS — I10 Essential (primary) hypertension: Secondary | ICD-10-CM | POA: Diagnosis not present

## 2022-07-09 DIAGNOSIS — E039 Hypothyroidism, unspecified: Secondary | ICD-10-CM | POA: Insufficient documentation

## 2022-07-09 DIAGNOSIS — R739 Hyperglycemia, unspecified: Secondary | ICD-10-CM

## 2022-07-09 DIAGNOSIS — Z85118 Personal history of other malignant neoplasm of bronchus and lung: Secondary | ICD-10-CM | POA: Insufficient documentation

## 2022-07-09 DIAGNOSIS — E1110 Type 2 diabetes mellitus with ketoacidosis without coma: Principal | ICD-10-CM | POA: Diagnosis present

## 2022-07-09 DIAGNOSIS — E1165 Type 2 diabetes mellitus with hyperglycemia: Secondary | ICD-10-CM | POA: Diagnosis present

## 2022-07-09 DIAGNOSIS — N179 Acute kidney failure, unspecified: Secondary | ICD-10-CM | POA: Insufficient documentation

## 2022-07-09 DIAGNOSIS — Z7984 Long term (current) use of oral hypoglycemic drugs: Secondary | ICD-10-CM | POA: Diagnosis not present

## 2022-07-09 LAB — BLOOD GAS, VENOUS
Acid-base deficit: 0.4 mmol/L (ref 0.0–2.0)
Bicarbonate: 26.7 mmol/L (ref 20.0–28.0)
O2 Saturation: 26.6 %
Patient temperature: 37
pCO2, Ven: 53 mmHg (ref 44–60)
pH, Ven: 7.31 (ref 7.25–7.43)
pO2, Ven: <31 mmHg — CL (ref 32–45)

## 2022-07-09 LAB — CBC WITH DIFFERENTIAL/PLATELET
Abs Immature Granulocytes: 0.02 10*3/uL (ref 0.00–0.07)
Basophils Absolute: 0 10*3/uL (ref 0.0–0.1)
Basophils Relative: 0 %
Eosinophils Absolute: 0 10*3/uL (ref 0.0–0.5)
Eosinophils Relative: 1 %
HCT: 60 % — ABNORMAL HIGH (ref 39.0–52.0)
Hemoglobin: 19.5 g/dL — ABNORMAL HIGH (ref 13.0–17.0)
Immature Granulocytes: 1 %
Lymphocytes Relative: 12 %
Lymphs Abs: 0.5 10*3/uL — ABNORMAL LOW (ref 0.7–4.0)
MCH: 30.9 pg (ref 26.0–34.0)
MCHC: 32.5 g/dL (ref 30.0–36.0)
MCV: 94.9 fL (ref 80.0–100.0)
Monocytes Absolute: 0.3 10*3/uL (ref 0.1–1.0)
Monocytes Relative: 8 %
Neutro Abs: 3 10*3/uL (ref 1.7–7.7)
Neutrophils Relative %: 78 %
Platelets: 110 10*3/uL — ABNORMAL LOW (ref 150–400)
RBC: 6.32 MIL/uL — ABNORMAL HIGH (ref 4.22–5.81)
RDW: 14 % (ref 11.5–15.5)
WBC: 3.8 10*3/uL — ABNORMAL LOW (ref 4.0–10.5)
nRBC: 0 % (ref 0.0–0.2)

## 2022-07-09 LAB — URINALYSIS, ROUTINE W REFLEX MICROSCOPIC
Bacteria, UA: NONE SEEN
Bilirubin Urine: NEGATIVE
Glucose, UA: >=500 mg/dL — AB
Ketones, ur: 5 mg/dL — AB
Leukocytes,Ua: NEGATIVE
Nitrite: NEGATIVE
Protein, ur: NEGATIVE mg/dL
Specific Gravity, Urine: 1.025 (ref 1.005–1.030)
pH: 5 (ref 5.0–8.0)

## 2022-07-09 LAB — COMPREHENSIVE METABOLIC PANEL
ALT: 22 U/L (ref 0–44)
AST: 23 U/L (ref 15–41)
Albumin: 4 g/dL (ref 3.5–5.0)
Alkaline Phosphatase: 151 U/L — ABNORMAL HIGH (ref 38–126)
Anion gap: 13 (ref 5–15)
BUN: 42 mg/dL — ABNORMAL HIGH (ref 8–23)
CO2: 23 mmol/L (ref 22–32)
Calcium: 10.5 mg/dL — ABNORMAL HIGH (ref 8.9–10.3)
Chloride: 97 mmol/L — ABNORMAL LOW (ref 98–111)
Creatinine, Ser: 1.98 mg/dL — ABNORMAL HIGH (ref 0.61–1.24)
GFR, Estimated: 34 mL/min — ABNORMAL LOW (ref >60)
Glucose, Bld: 764 mg/dL (ref 70–99)
Potassium: 5.5 mmol/L — ABNORMAL HIGH (ref 3.5–5.1)
Sodium: 133 mmol/L — ABNORMAL LOW (ref 135–145)
Total Bilirubin: 0.7 mg/dL (ref 0.3–1.2)
Total Protein: 7.8 g/dL (ref 6.5–8.1)

## 2022-07-09 LAB — CBG MONITORING, ED
Glucose-Capillary: 486 mg/dL — ABNORMAL HIGH (ref 70–99)
Glucose-Capillary: >600 mg/dL (ref 70–99)

## 2022-07-09 LAB — BETA-HYDROXYBUTYRIC ACID: Beta-Hydroxybutyric Acid: 2.85 mmol/L — ABNORMAL HIGH (ref 0.05–0.27)

## 2022-07-09 MED ORDER — DEXTROSE IN LACTATED RINGERS 5 % IV SOLN: INTRAVENOUS | Status: DC

## 2022-07-09 MED ORDER — INSULIN ASPART 100 UNIT/ML IV SOLN
10.0000 [IU] | Freq: Once | INTRAVENOUS | Status: DC
  Filled 2022-07-09: qty 0.1

## 2022-07-09 MED ORDER — DEXTROSE 50 % IV SOLN: 0.0000 mL | INTRAVENOUS | Status: DC | PRN

## 2022-07-09 MED ORDER — LACTATED RINGERS IV SOLN: INTRAVENOUS | Status: DC

## 2022-07-09 MED ORDER — LACTATED RINGERS IV BOLUS
2254.0000 mL | Freq: Once | INTRAVENOUS | Status: AC
  Administered 2022-07-09: 2254 mL via INTRAVENOUS

## 2022-07-09 MED ORDER — LACTATED RINGERS IV BOLUS
1000.0000 mL | Freq: Once | INTRAVENOUS | Status: AC
  Administered 2022-07-09: 1000 mL via INTRAVENOUS

## 2022-07-09 MED ORDER — ENOXAPARIN SODIUM 40 MG/0.4ML IJ SOSY
40.0000 mg | PREFILLED_SYRINGE | INTRAMUSCULAR | Status: DC
  Administered 2022-07-10: 40 mg via SUBCUTANEOUS
  Filled 2022-07-09: qty 0.4

## 2022-07-09 MED ORDER — INSULIN REGULAR(HUMAN) IN NACL 100-0.9 UT/100ML-% IV SOLN
INTRAVENOUS | Status: DC
Start: 2022-07-09 — End: 2022-07-10
  Administered 2022-07-09: 13 [IU]/h via INTRAVENOUS
  Filled 2022-07-09: qty 100

## 2022-07-09 NOTE — ED Notes (Signed)
[facility] recollected and sent to lab, called lab to notify

## 2022-07-09 NOTE — H&P (Signed)
History and Physical    Patient: Paul [name] IFO:[id number] DOB: 1946/10/14 DOA: 07/09/2022 DOS: the patient was seen and examined on 07/10/2022 PCP: [name]  Patient coming from: Home  Chief Complaint:  Chief Complaint  Patient presents with   Hyperglycemia   HPI: Paul [name] is a 76 y.o. male with medical history significant of  NSCLC s/p systemic chemotherapy now on immunotherapy, insulin-dependent type 2 diabetes, CAD, hypertension and GERD who presents with concerns of nausea and vomiting.  Symptoms have been ongoing for 2 weeks.  Previously on insulin but has not been able to receive it in the past 2 to 3 weeks from the [place].  He is on immunotherapy for non-small cell lung cancer but missed this months injection since he has not been feeling well.  He denies any abdominal pain no diarrhea.  Has occasional cough but no shortness of breath or chest pain.  In the ED, he was afebrile normotensive on room air. Blood glucose significant elevated at 764 with increasing anion gap at 13.  pH of 7.3.  CO2 of 23.  Beta hydroxybutyrate of 2.85.  Has AKI with creatinine of 1.98 from prior of 1.26. Has leukopenia of 3.8.  Hemoglobin is hemoconcentrated 19.5. Review of Systems: As mentioned in the history of present illness. All other systems reviewed and are negative. Past Medical History:  Diagnosis Date   CHF (congestive heart failure) ([place])    Coronary artery disease    Diabetes mellitus without complication ([place])    History of radiation therapy    Right lung- 11/27/21-01/10/22- Dr. [name]   Hypertension    Hypothyroidism    Ischemic cardiomyopathy    Pneumonia    a long time ago   Sleep apnea    no longer uses a cpap   Past Surgical History:  Procedure Laterality Date   BRONCHIAL BIOPSY  06/04/2021   Procedure: BRONCHIAL BIOPSIES;  Surgeon: Garner Nash, DO;  Location: [facility];  Service: Pulmonary;;   BRONCHIAL BRUSHINGS  06/04/2021   Procedure:  BRONCHIAL BRUSHINGS;  Surgeon: Garner Nash, DO;  Location: [facility];  Service: Pulmonary;;   BRONCHIAL NEEDLE ASPIRATION BIOPSY  06/04/2021   Procedure: BRONCHIAL NEEDLE ASPIRATION BIOPSIES;  Surgeon: Garner Nash, DO;  Location: [facility];  Service: Pulmonary;;   BRONCHIAL WASHINGS  06/04/2021   Procedure: BRONCHIAL WASHINGS;  Surgeon: Garner Nash, DO;  Location: [facility];  Service: Pulmonary;;   CARDIAC SURGERY     COLONOSCOPY     CORONARY ARTERY BYPASS GRAFT     2019 at [facility] Right 06/04/2021   Procedure: [place];  Surgeon: Garner Nash, DO;  Location: [facility];  Service: Pulmonary;  Laterality: Right;   VIDEO BRONCHOSCOPY WITH ENDOBRONCHIAL ULTRASOUND  06/04/2021   Procedure: VIDEO BRONCHOSCOPY WITH ENDOBRONCHIAL ULTRASOUND;  Surgeon: Garner Nash, DO;  Location: [facility];  Service: Pulmonary;;   Social History:  reports that he has been smoking cigarettes. He started smoking about 58 years ago. He has been smoking an average of .5 packs per day. He has never used smokeless tobacco. He reports that he does not drink alcohol and does not use drugs.  No Known Allergies  Family History  Family history unknown: Yes    Prior to Admission medications   Medication Sig Start Date End Date Taking? Authorizing Provider  ammonium lactate (LAC-HYDRIN) 12 % lotion Apply 1 Application topically in the morning and  at bedtime. 03/17/22  Yes [provider]  atorvastatin (LIPITOR) 40 MG tablet Take 40 mg by mouth daily.   Yes [provider]  aspirin 81 MG chewable tablet Take 81 mg by mouth daily.    [provider]  benzonatate (TESSALON) 100 MG capsule Take 1 capsule (100 mg total) by mouth 3 (three) times daily as needed for cough. 06/04/22   Heilingoetter, Cassandra L, PA-C  blood glucose meter kit and supplies KIT Dispense based on patient  and insurance preference. Use up to four times daily as directed. 06/24/21   Oswald Hillock, MD  carvedilol (COREG) 25 MG tablet Take 25 mg by mouth at bedtime. 04/14/18   [provider]  chlorpheniramine-HYDROcodone (TUSSIONEX PENNKINETIC ER) 10-8 MG/5ML Take 5 mLs by mouth every 12 (twelve) hours as needed for cough. Patient not taking: Reported on 05/07/2022 03/14/22   Heilingoetter, Cassandra L, PA-C  folic acid (FOLVITE) 1 MG tablet Take 1 tablet (1 mg total) by mouth daily. Patient not taking: Reported on 07/09/2022 06/17/21   Curt Bears, MD  insulin glargine (LANTUS) 100 UNIT/ML Solostar Pen Inject 10 Units into the skin daily. 06/24/21   Oswald Hillock, MD  levothyroxine (SYNTHROID) 75 MCG tablet Take 1 tablet (75 mcg total) by mouth daily before breakfast. 05/08/22   Curt Bears, MD  lidocaine-prilocaine (EMLA) cream Apply 1 application topically as needed. 12/10/21   Heilingoetter, Cassandra L, PA-C  Menthol-Methyl Salicylate (MUSCLE RUB) 10-15 % CREA Apply 1 application topically daily as needed for muscle pain.    [provider]  metFORMIN (GLUCOPHAGE) 500 MG tablet Take 1 tablet (500 mg total) by mouth 2 (two) times daily with a meal. 06/24/21 11/20/21  Oswald Hillock, MD  Multiple Vitamins-Minerals (MULTIVITAMIN WITH MINERALS) tablet Take 1 tablet by mouth daily.    [provider]  nitroGLYCERIN (NITROSTAT) 0.4 MG SL tablet Place 0.4 mg under the tongue every 5 (five) minutes as needed for chest pain. Patient not taking: Reported on 05/07/2022 09/06/18   [provider]  prochlorperazine (COMPAZINE) 10 MG tablet Take 1 tablet (10 mg total) by mouth every 6 (six) hours as needed for nausea or vomiting. Patient not taking: Reported on 03/11/2022 06/17/21   Curt Bears, MD  sacubitril-valsartan (ENTRESTO) 49-51 MG Take 0.5 tablets by mouth 2 (two) times daily.    [provider]  urea (CARMOL) 20 % cream Apply 1 Application topically 2 (two)  times daily. 03/17/22   [provider]    Physical Exam: Vitals:   07/09/22 2330 07/09/22 2336 07/10/22 0000 07/10/22 0002  BP: 135/76  125/79   Pulse: 85  84 81  Resp: 18   18  Temp:  97.8 F (36.6 C) 97.8 F (36.6 C)   TempSrc:  Oral Oral   SpO2: 96%  94% 96%  Weight:   103.8 kg    Constitutional: NAD, calm, comfortable, well-appearing nontoxic elderly male laying in bed Eyes:  lids and conjunctivae normal ENMT: Mucous membranes are moist.  Neck: normal, supple,  Respiratory: clear to auscultation bilaterally, no wheezing, no crackles. Normal respiratory effort. No accessory muscle use.  Cardiovascular: Regular rate and rhythm, no murmurs / rubs / gallops. No extremity edema.  Abdomen: no tenderness,  Bowel sounds positive.  Musculoskeletal: no clubbing / cyanosis. No joint deformity upper and lower extremities. Normal muscle tone.  Skin: no rashes, lesions, ulcers.  Neurologic: CN 2-12 grossly intact. Strength 5/5 in all 4.  Psychiatric: Normal judgment and insight.  Alert and oriented x 3. Normal mood. Data Reviewed:  See HPI  Assessment and Plan: * DKA (diabetic ketoacidosis) ([place]) Early DKA from not having insulin for 2-3 weeks. Reportedly had trouble getting in from [place].  - continue insulin gtt with goal of 140-180 and AG <12 - IV NS until BG <250, then switch to D5 1/2 NS  - BMP q4hr  - keep NPO   AKI (acute kidney injury) ([place]) - Creatinine elevated to 1.98 from baseline of 0.7. - Follow repeat after aggressive fluid resuscitation   Hyperlipidemia Continue statin  Essential hypertension - Continue Coreg, hold Entresto due to AKI  Adenocarcinoma of right lung, stage 3 (HCC) S/p systemic chemotherapy currently on immunotherapy monthly -Follows with Dr. Earlie Server      Advance Care Planning:   Code Status: Full Code   Consults: None  Family Communication: No family at bedside  Severity of Illness: The appropriate patient status for this  patient is OBSERVATION. Observation status is judged to be reasonable and necessary in order to provide the required intensity of service to ensure the patient's safety. The patient's presenting symptoms, physical exam findings, and initial radiographic and laboratory data in the context of their medical condition is felt to place them at decreased risk for further clinical deterioration. Furthermore, it is anticipated that the patient will be medically stable for discharge from the hospital within 2 midnights of admission.   Author: Orene Desanctis, DO 07/10/2022 12:42 AM  For on call review [email or web address].

## 2022-07-09 NOTE — ED Notes (Signed)
ED TO INPATIENT HANDOFF REPORT  ED Nurse Name and Phone #:   S Name/Age/Gender Paul [name] 76 y.o. male Room/Bed: WA08/WA08  Code Status   Code Status: Full Code  Home/SNF/Other Home Patient oriented to: self, place, time, and situation Is this baseline? Yes   Triage Complete: Triage complete  Chief Complaint DKA (diabetic ketoacidosis) (Paul [name]) [E11.10]  Triage Note Pt arrives via [facility] from home for hyperglycemia and weakness. Pt reports poor insulin compliance recently. Pt endorses polydipsia and polyuria. CBG with EMS was >600.    Allergies No Known Allergies  Level of Care/Admitting Diagnosis ED Disposition     ED Disposition  Admit   Condition  --   Comment  Hospital Area: [facility] [[id number]]  Level of Care: Stepdown [14]  Admit to SDU based on following criteria: Severe physiological/psychological symptoms:  Any diagnosis requiring assessment & intervention at least every 4 hours on an ongoing basis to obtain desired patient outcomes including stability and rehabilitation  May place patient in observation at [facility] or [place] if equivalent level of care is available:: No  Covid Evaluation: Asymptomatic - no recent exposure (last 10 days) testing not required  Diagnosis: DKA (diabetic ketoacidosis) [facility]) [[id number]]  Admitting Physician: Orene Desanctis [[id number]]  Attending Physician: Orene Desanctis [[id number]]          B Medical/Surgery History Past Medical History:  Diagnosis Date   CHF (congestive heart failure) (Paul [name])    Coronary artery disease    Diabetes mellitus without complication (Paul [name])    History of radiation therapy    Right lung- 11/27/21-01/10/22- Dr. Gery [name]   Hypertension    Hypothyroidism    Ischemic cardiomyopathy    Pneumonia    a long time ago   Sleep apnea    no longer uses a cpap   Past Surgical History:  Procedure Laterality Date   BRONCHIAL BIOPSY  06/04/2021   Procedure: BRONCHIAL BIOPSIES;   Surgeon: Garner Nash, DO;  Location: [facility];  Service: Pulmonary;;   BRONCHIAL BRUSHINGS  06/04/2021   Procedure: BRONCHIAL BRUSHINGS;  Surgeon: Garner Nash, DO;  Location: [facility];  Service: Pulmonary;;   BRONCHIAL NEEDLE ASPIRATION BIOPSY  06/04/2021   Procedure: BRONCHIAL NEEDLE ASPIRATION BIOPSIES;  Surgeon: Garner Nash, DO;  Location: [facility];  Service: Pulmonary;;   BRONCHIAL WASHINGS  06/04/2021   Procedure: BRONCHIAL WASHINGS;  Surgeon: Garner Nash, DO;  Location: [facility];  Service: Pulmonary;;   CARDIAC SURGERY     COLONOSCOPY     CORONARY ARTERY BYPASS GRAFT     2019 at [facility] Right 06/04/2021   Procedure: VIDEO BRONCHOSCOPY WITH ENDOBRONCHIAL NAVIGATION;  Surgeon: Garner Nash, DO;  Location: [facility];  Service: Pulmonary;  Laterality: Right;   VIDEO BRONCHOSCOPY WITH ENDOBRONCHIAL ULTRASOUND  06/04/2021   Procedure: VIDEO BRONCHOSCOPY WITH ENDOBRONCHIAL ULTRASOUND;  Surgeon: Garner Nash, DO;  Location: [facility];  Service: Pulmonary;;     A IV Location/Drains/Wounds Patient Lines/Drains/Airways Status     Active Line/Drains/Airways     Name Placement date Placement time Site Days   Peripheral IV 07/09/22 22 G 1.75" Anterior;Left Forearm 07/09/22  1853  Forearm  less than 1   External Urinary Catheter 07/09/22  2251  --  less than 1            Intake/Output Last 24 hours No intake or output data in the 24 hours ending 07/09/22 2333  Labs/Imaging Results for orders placed or performed during the hospital encounter of 07/09/22 (from the past 48 hour(s))  Beta-hydroxybutyric acid     Status: Abnormal   Collection Time: 07/09/22  5:24 PM  Result Value Ref Range   Beta-Hydroxybutyric Acid 2.85 (H) 0.05 - 0.27 mmol/L    Comment: Performed at [facility], [place] [address] [place], [place] [address]  CBC with Differential (PNL)     Status: Abnormal    Collection Time: 07/09/22  5:24 PM  Result Value Ref Range   WBC 3.8 (L) 4.0 - 10.5 K/uL   RBC 6.32 (H) 4.22 - 5.81 MIL/uL   Hemoglobin 19.5 (H) 13.0 - 17.0 g/dL   HCT 60.0 (H) 39.0 - 52.0 %   MCV 94.9 80.0 - 100.0 fL   MCH 30.9 26.0 - 34.0 pg   MCHC 32.5 30.0 - 36.0 g/dL   RDW 14.0 11.5 - 15.5 %   Platelets 110 (L) 150 - 400 K/uL   nRBC 0.0 0.0 - 0.2 %   Neutrophils Relative % 78 %   Neutro Abs 3.0 1.7 - 7.7 K/uL   Lymphocytes Relative 12 %   Lymphs Abs 0.5 (L) 0.7 - 4.0 K/uL   Monocytes Relative 8 %   Monocytes Absolute 0.3 0.1 - 1.0 K/uL   Eosinophils Relative 1 %   Eosinophils Absolute 0.0 0.0 - 0.5 K/uL   Basophils Relative 0 %   Basophils Absolute 0.0 0.0 - 0.1 K/uL   Immature Granulocytes 1 %   Abs Immature Granulocytes 0.02 0.00 - 0.07 K/uL    Comment: Performed at [facility], [place] [address] [place], [place] [address]  Comprehensive metabolic panel     Status: Abnormal   Collection Time: 07/09/22  5:24 PM  Result Value Ref Range   Sodium 133 (L) 135 - 145 mmol/L   Potassium 5.5 (H) 3.5 - 5.1 mmol/L   Chloride 97 (L) 98 - 111 mmol/L   CO2 23 22 - 32 mmol/L   Glucose, Bld 764 (HH) 70 - 99 mg/dL    Comment: CRITICAL RESULT CALLED TO, READ BACK BY AND VERIFIED WITH GROVES,R AT 2038 ON 07/09/22 BY VAZQUEZ,J Glucose reference range applies only to samples taken after fasting for at least 8 hours.    BUN 42 (H) 8 - 23 mg/dL   Creatinine, Ser 1.98 (H) 0.61 - 1.24 mg/dL   Calcium 10.5 (H) 8.9 - 10.3 mg/dL   Total Protein 7.8 6.5 - 8.1 g/dL   Albumin 4.0 3.5 - 5.0 g/dL   AST 23 15 - 41 U/L   ALT 22 0 - 44 U/L   Alkaline Phosphatase 151 (H) 38 - 126 U/L   Total Bilirubin 0.7 0.3 - 1.2 mg/dL   GFR, Estimated 34 (L) >60 mL/min    Comment: (NOTE) Calculated using the CKD-EPI Creatinine Equation (2021)    Anion gap 13 5 - 15    Comment: Performed at [facility], [place] [address] [place], [place] [address]  Urinalysis, Routine w  reflex microscopic Urine, Clean Catch     Status: Abnormal   Collection Time: 07/09/22  5:35 PM  Result Value Ref Range   Color, Urine STRAW (A) YELLOW   APPearance CLEAR CLEAR   Specific Gravity, Urine 1.025 1.005 - 1.030   pH 5.0 5.0 - 8.0   Glucose, UA >=500 (A) NEGATIVE mg/dL   Hgb urine dipstick SMALL (A) NEGATIVE   Bilirubin Urine NEGATIVE NEGATIVE   Ketones,  ur 5 (A) NEGATIVE mg/dL   Protein, ur NEGATIVE NEGATIVE mg/dL   Nitrite NEGATIVE NEGATIVE   Leukocytes,Ua NEGATIVE NEGATIVE   RBC / HPF 0-5 0 - 5 RBC/hpf   WBC, UA 0-5 0 - 5 WBC/hpf   Bacteria, UA NONE SEEN NONE SEEN    Comment: Performed at [facility], [place] [address] [place], [place] [address]  CBG monitoring, ED     Status: Abnormal   Collection Time: 07/09/22  6:09 PM  Result Value Ref Range   Glucose-Capillary >600 (HH) 70 - 99 mg/dL    Comment: Glucose reference range applies only to samples taken after fasting for at least 8 hours.   Comment 1 Notify RN    Comment 2 Document in Chart   Blood gas, venous     Status: Abnormal   Collection Time: 07/09/22  8:19 PM  Result Value Ref Range   pH, Ven 7.31 7.25 - 7.43   pCO2, Ven 53 44 - 60 mmHg   pO2, Ven <31 (LL) 32 - 45 mmHg    Comment: CRITICAL RESULT CALLED TO, READ BACK BY AND VERIFIED WITH: GROVES,R AT 2037 ON 07/09/22 BY VAZQUEZ,J    Bicarbonate 26.7 20.0 - 28.0 mmol/L   Acid-base deficit 0.4 0.0 - 2.0 mmol/L   O2 Saturation 26.6 %   Patient temperature 37.0     Comment: Performed at [facility], [place] [address] [place], [place] [address]  CBG monitoring, ED     Status: Abnormal   Collection Time: 07/09/22 11:31 PM  Result Value Ref Range   Glucose-Capillary 486 (H) 70 - 99 mg/dL    Comment: Glucose reference range applies only to samples taken after fasting for at least 8 hours.   No results found.  Pending Labs Unresulted Labs (From admission, onward)     Start     Ordered   07/09/22 2315  Hemoglobin A1c   (Diabetes Ketoacidosis (DKA))  Once,   R       Comments: To assess prior glycemic control.    07/09/22 2315   07/09/22 6834  Basic metabolic panel  STAT Now then every 4 hours ,   R (with STAT occurrences)      07/09/22 2103   07/09/22 1633  Beta-hydroxybutyric acid  (Diabetes Ketoacidosis (DKA))  Now then every 8 hours,   STAT (with URGENT occurrences)      07/09/22 1632            Vitals/Pain Today's Vitals   07/09/22 1800 07/09/22 1830 07/09/22 1900 07/09/22 2030  BP: 126/85 136/82 130/82 134/76  Pulse: 86 86 84 83  Resp: 20 (!) 21 (!) 25 18  Temp:      SpO2: 97% 98% 95% 98%  PainSc:        Isolation Precautions No active isolations  Medications Medications  insulin regular, human (MYXREDLIN) 100 units/ 100 mL infusion (13 Units/hr Intravenous New Bag/Given 07/09/22 2204)  lactated ringers infusion ( Intravenous New Bag/Given 07/09/22 2159)  dextrose 5 % in lactated ringers infusion (has no administration in time range)  dextrose 50 % solution 0-50 mL (has no administration in time range)  enoxaparin (LOVENOX) injection 40 mg (has no administration in time range)  lactated ringers bolus 1,000 mL (1,000 mLs Intravenous New Bag/Given 07/09/22 1901)  lactated ringers bolus 1,000 mL (1,000 mLs Intravenous New Bag/Given 07/09/22 2157)  lactated ringers bolus 2,254 mL (2,254 mLs Intravenous New Bag/Given 07/09/22 2158)    Mobility walks Moderate  fall risk   Focused Assessments    R Recommendations: See Admitting Provider Note  Report given to:   Additional Notes:

## 2022-07-09 NOTE — ED Triage Notes (Signed)
Pt arrives via [facility] from home for hyperglycemia and weakness. Pt reports poor insulin compliance recently. Pt endorses polydipsia and polyuria. CBG with EMS was >600.

## 2022-07-09 NOTE — ED Provider Notes (Signed)
[facility] DEPT Provider Note  CSN: [id number] Arrival date & time: 07/09/22 1541  Chief Complaint(s) Hyperglycemia  HPI Paul [name] is a 76 y.o. male with PMH CHF, T2DM, HTN, hypothyroidism, lung cancer who presents emergency department for evaluation of hyperglycemia and dehydration.  Patient states that he has not taken his home insulin in many days due to difficulty getting his medication from the [place].  He states that he is urinating himself while asleep which is abnormal for him.  He arrives with tacky mucous membranes and states he feels very dehydrated.  Denies abdominal pain, nausea, vomiting, headache, fever or other systemic symptoms.   Past Medical History Past Medical History:  Diagnosis Date   CHF (congestive heart failure) (HCC)    Coronary artery disease    Diabetes mellitus without complication ([place])    History of radiation therapy    Right lung- 11/27/21-01/10/22- Dr. [name]   Hypertension    Hypothyroidism    Ischemic cardiomyopathy    Pneumonia    a long time ago   Sleep apnea    no longer uses a cpap   Patient Active Problem List   Diagnosis Date Noted   Cough 06/04/2022   Goals of care, counseling/discussion 10/23/2021   Hyperosmolar hyperglycemic state (HHS) ([place]) 06/23/2021   Hyperglycemia due to diabetes mellitus ([place]) 06/22/2021   GERD (gastroesophageal reflux disease) 06/22/2021   Essential hypertension 06/22/2021   Hyperlipidemia 06/22/2021   Adenocarcinoma of right lung, stage 3 ([place]) 06/17/2021   Encounter for antineoplastic chemotherapy 06/17/2021   Encounter for antineoplastic immunotherapy 06/17/2021   Lung nodule 05/17/2021   CKD (chronic kidney disease) stage 3, GFR 30-59 ml/min ([place]) 09/03/2018   CAD, multiple vessel 09/03/2018   Home Medication(s) Prior to Admission medications   Medication Sig Start Date End Date Taking? Authorizing Provider  ammonium lactate (LAC-HYDRIN) 12 % lotion APPLY AS  DIRECTED TO AFFECTED AREA TWICE A DAY 03/17/22   [provider]  aspirin 81 MG chewable tablet Take 81 mg by mouth daily.    [provider]  benzonatate (TESSALON) 100 MG capsule Take 1 capsule (100 mg total) by mouth 3 (three) times daily as needed for cough. 06/04/22   Heilingoetter, Cassandra L, PA-C  blood glucose meter kit and supplies KIT Dispense based on patient and insurance preference. Use up to four times daily as directed. 06/24/21   Oswald Hillock, MD  carvedilol (COREG) 25 MG tablet Take 25 mg by mouth at bedtime. 04/14/18   [provider]  chlorpheniramine-HYDROcodone (TUSSIONEX PENNKINETIC ER) 10-8 MG/5ML Take 5 mLs by mouth every 12 (twelve) hours as needed for cough. Patient not taking: Reported on 05/07/2022 03/14/22   Heilingoetter, Cassandra L, PA-C  folic acid (FOLVITE) 1 MG tablet Take 1 tablet (1 mg total) by mouth daily. 06/17/21   Curt Bears, MD  furosemide (LASIX) 20 MG tablet Take 20 mg by mouth at bedtime.    [provider]  insulin glargine (LANTUS) 100 UNIT/ML Solostar Pen Inject 10 Units into the skin daily. 06/24/21   Oswald Hillock, MD  levothyroxine (SYNTHROID) 75 MCG tablet Take 1 tablet (75 mcg total) by mouth daily before breakfast. 05/08/22   Curt Bears, MD  lidocaine-prilocaine (EMLA) cream Apply 1 application topically as needed. 12/10/21   Heilingoetter, Cassandra L, PA-C  Menthol-Methyl Salicylate (MUSCLE RUB) 10-15 % CREA Apply 1 application topically daily as needed for muscle pain.    [provider]  metFORMIN (GLUCOPHAGE) 500 MG tablet  Take 1 tablet (500 mg total) by mouth 2 (two) times daily with a meal. 06/24/21 11/20/21  Oswald Hillock, MD  Multiple Vitamins-Minerals (MULTIVITAMIN WITH MINERALS) tablet Take 1 tablet by mouth daily.    [provider]  nitroGLYCERIN (NITROSTAT) 0.4 MG SL tablet Place 0.4 mg under the tongue every 5 (five) minutes as needed for chest pain. Patient not taking:  Reported on 05/07/2022 09/06/18   [provider]  omeprazole (PRILOSEC) 20 MG capsule Take 20 mg by mouth daily.    [provider]  prochlorperazine (COMPAZINE) 10 MG tablet Take 1 tablet (10 mg total) by mouth every 6 (six) hours as needed for nausea or vomiting. Patient not taking: Reported on 03/11/2022 06/17/21   Curt Bears, MD  sacubitril-valsartan (ENTRESTO) 49-51 MG Take 0.5 tablets by mouth 2 (two) times daily.    [provider]  urea (CARMOL) 20 % cream APPLY THIN LAYER TO AFFECTED AREA TWICE A DAY 03/17/22   [provider]                                                                                                                                    Past Surgical History Past Surgical History:  Procedure Laterality Date   BRONCHIAL BIOPSY  06/04/2021   Procedure: BRONCHIAL BIOPSIES;  Surgeon: Garner Nash, DO;  Location: [facility];  Service: Pulmonary;;   BRONCHIAL BRUSHINGS  06/04/2021   Procedure: BRONCHIAL BRUSHINGS;  Surgeon: Garner Nash, DO;  Location: [facility];  Service: Pulmonary;;   BRONCHIAL NEEDLE ASPIRATION BIOPSY  06/04/2021   Procedure: BRONCHIAL NEEDLE ASPIRATION BIOPSIES;  Surgeon: Garner Nash, DO;  Location: [facility];  Service: Pulmonary;;   BRONCHIAL WASHINGS  06/04/2021   Procedure: BRONCHIAL WASHINGS;  Surgeon: Garner Nash, DO;  Location: [facility];  Service: Pulmonary;;   CARDIAC SURGERY     COLONOSCOPY     CORONARY ARTERY BYPASS GRAFT     2019 at [facility] Right 06/04/2021   Procedure: [place];  Surgeon: Garner Nash, DO;  Location: [facility];  Service: Pulmonary;  Laterality: Right;   VIDEO BRONCHOSCOPY WITH ENDOBRONCHIAL ULTRASOUND  06/04/2021   Procedure: VIDEO BRONCHOSCOPY WITH ENDOBRONCHIAL ULTRASOUND;  Surgeon: Garner Nash, DO;  Location: [facility];  Service: Pulmonary;;   Family  History Family History  Family history unknown: Yes    Social History Social History   Tobacco Use   Smoking status: Some Days    Packs/day: 0.50    Types: Cigarettes    Start date: 1965   Smokeless tobacco: Never   Tobacco comments:    Started smoking in [place] age 86  Vaping Use   Vaping Use: Never used  Substance Use Topics   Alcohol use: No   Drug use: Never   Allergies Patient has no known allergies.  Review of Systems Review of  Systems  Endocrine: Positive for polydipsia, polyphagia and polyuria.  Genitourinary:  Positive for enuresis.    Physical Exam Vital Signs  I have reviewed the triage vital signs BP (!) 148/83   Pulse 76   Temp (!) 97.5 F (36.4 C)   Resp 14   SpO2 96%   Physical Exam Constitutional:      General: He is not in acute distress.    Appearance: Normal appearance.  HENT:     Head: Normocephalic and atraumatic.     Comments: Dry tacky mucous membranes    Nose: No congestion or rhinorrhea.  Eyes:     General:        Right eye: No discharge.        Left eye: No discharge.     Extraocular Movements: Extraocular movements intact.     Pupils: Pupils are equal, round, and reactive to light.  Cardiovascular:     Rate and Rhythm: Normal rate and regular rhythm.     Heart sounds: No murmur heard. Pulmonary:     Effort: No respiratory distress.     Breath sounds: No wheezing or rales.  Abdominal:     General: There is no distension.     Tenderness: There is no abdominal tenderness.  Musculoskeletal:        General: Normal range of motion.     Cervical back: Normal range of motion.  Skin:    General: Skin is warm and dry.  Neurological:     General: No focal deficit present.     Mental Status: He is alert.     ED Results and Treatments Labs (all labs ordered are listed, but only abnormal results are displayed) Labs Reviewed  BETA-HYDROXYBUTYRIC ACID  BETA-HYDROXYBUTYRIC ACID  CBC WITH DIFFERENTIAL/PLATELET  URINALYSIS,  ROUTINE W REFLEX MICROSCOPIC  BLOOD GAS, VENOUS  COMPREHENSIVE METABOLIC PANEL  CBG MONITORING, ED                                                                                                                          Radiology No results found.  Pertinent labs & imaging results that were available during my care of the patient were reviewed by me and considered in my medical decision making (see MDM for details).  Medications Ordered in ED Medications  lactated ringers bolus 1,000 mL (has no administration in time range)  Procedures .Critical Care  Performed by: Teressa Lower, MD Authorized by: Teressa Lower, MD   Critical care provider statement:    Critical care time (minutes):  30   Critical care was necessary to treat or prevent imminent or life-threatening deterioration of the following conditions:  Endocrine crisis   Critical care was time spent personally by me on the following activities:  Development of treatment plan with patient or surrogate, discussions with consultants, evaluation of patient's response to treatment, examination of patient, ordering and review of laboratory studies, ordering and review of radiographic studies, ordering and performing treatments and interventions, pulse oximetry, re-evaluation of patient's condition and review of old charts   (including critical care time)  Medical Decision Making / ED Course   This patient presents to the ED for concern of dehydration, hypoglycemia, this involves an extensive number of treatment options, and is a complaint that carries with it a high risk of complications and morbidity.  The differential diagnosis includes DKA, HHS, stress hyperglycemia, dehydration  MDM: Patient is an emergent problem for evaluation of feelings of dehydration and hyperglycemia.  Physical exam reveals  a patient with dry tacky mucous membranes but is otherwise unremarkable.  Initial glucose greater than 600.  Laboratory evaluation with a potassium of 5.5, glucose 674, BUN 42, creatinine 1.98, calcium 10.5 a, pH is 7.31 with beta-hydroxybutyrate 2.85.  Patient started on insulin drip and aggressively fluid rehydrated.  Patient then admitted for early DKA.  Additional history obtained:  -External records from outside source obtained and reviewed including: Chart review including previous notes, labs, imaging, consultation notes   Lab Tests: -I ordered, reviewed, and interpreted labs.   The pertinent results include:   Labs Reviewed  BETA-HYDROXYBUTYRIC ACID  BETA-HYDROXYBUTYRIC ACID  CBC WITH DIFFERENTIAL/PLATELET  URINALYSIS, ROUTINE W REFLEX MICROSCOPIC  BLOOD GAS, VENOUS  COMPREHENSIVE METABOLIC PANEL  CBG MONITORING, ED      Medicines ordered and prescription drug management: Meds ordered this encounter  Medications   lactated ringers bolus 1,000 mL    -I have reviewed the patients home medicines and have made adjustments as needed  Critical interventions Insulin drip, fluid resuscitation   Cardiac Monitoring: The patient was maintained on a cardiac monitor.  I personally viewed and interpreted the cardiac monitored which showed an underlying rhythm of: NSR  Social Determinants of Health:  Factors impacting patients care include: Difficulty with obtaining his insulin from the [place]   Reevaluation: After the interventions noted above, I reevaluated the patient and found that they have :improved  Co morbidities that complicate the patient evaluation  Past Medical History:  Diagnosis Date   CHF (congestive heart failure) ([place])    Coronary artery disease    Diabetes mellitus without complication ([place])    History of radiation therapy    Right lung- 11/27/21-01/10/22- Dr. [name]   Hypertension    Hypothyroidism    Ischemic cardiomyopathy    Pneumonia    a long  time ago   Sleep apnea    no longer uses a cpap      Dispostion: I considered admission for this patient, and due to dehydration with early DKA, patient admitted     Final Clinical Impression(s) / ED Diagnoses Final diagnoses:  None     _0 @    Teressa Lower, MD 07/09/22 2255

## 2022-07-10 DIAGNOSIS — E876 Hypokalemia: Secondary | ICD-10-CM

## 2022-07-10 DIAGNOSIS — I10 Essential (primary) hypertension: Secondary | ICD-10-CM | POA: Diagnosis not present

## 2022-07-10 DIAGNOSIS — N179 Acute kidney failure, unspecified: Secondary | ICD-10-CM | POA: Diagnosis not present

## 2022-07-10 DIAGNOSIS — C3491 Malignant neoplasm of unspecified part of right bronchus or lung: Secondary | ICD-10-CM | POA: Diagnosis not present

## 2022-07-10 DIAGNOSIS — E1110 Type 2 diabetes mellitus with ketoacidosis without coma: Secondary | ICD-10-CM | POA: Diagnosis not present

## 2022-07-10 DIAGNOSIS — E875 Hyperkalemia: Secondary | ICD-10-CM

## 2022-07-10 LAB — BASIC METABOLIC PANEL
Anion gap: 11 (ref 5–15)
Anion gap: 6 (ref 5–15)
BUN: 35 mg/dL — ABNORMAL HIGH (ref 8–23)
BUN: 27 mg/dL — ABNORMAL HIGH (ref 8–23)
CO2: 23 mmol/L (ref 22–32)
CO2: 27 mmol/L (ref 22–32)
Calcium: 10.2 mg/dL (ref 8.9–10.3)
Calcium: 10.1 mg/dL (ref 8.9–10.3)
Chloride: 107 mmol/L (ref 98–111)
Chloride: 111 mmol/L (ref 98–111)
Creatinine, Ser: 1.64 mg/dL — ABNORMAL HIGH (ref 0.61–1.24)
Creatinine, Ser: 1.42 mg/dL — ABNORMAL HIGH (ref 0.61–1.24)
GFR, Estimated: 43 mL/min — ABNORMAL LOW (ref >60)
GFR, Estimated: 51 mL/min — ABNORMAL LOW (ref >60)
Glucose, Bld: 390 mg/dL — ABNORMAL HIGH (ref 70–99)
Glucose, Bld: 151 mg/dL — ABNORMAL HIGH (ref 70–99)
Potassium: 4.3 mmol/L (ref 3.5–5.1)
Potassium: 3.3 mmol/L — ABNORMAL LOW (ref 3.5–5.1)
Sodium: 141 mmol/L (ref 135–145)
Sodium: 144 mmol/L (ref 135–145)

## 2022-07-10 LAB — GLUCOSE, CAPILLARY
Glucose-Capillary: 127 mg/dL — ABNORMAL HIGH (ref 70–99)
Glucose-Capillary: 136 mg/dL — ABNORMAL HIGH (ref 70–99)
Glucose-Capillary: 147 mg/dL — ABNORMAL HIGH (ref 70–99)
Glucose-Capillary: 157 mg/dL — ABNORMAL HIGH (ref 70–99)
Glucose-Capillary: 166 mg/dL — ABNORMAL HIGH (ref 70–99)
Glucose-Capillary: 206 mg/dL — ABNORMAL HIGH (ref 70–99)
Glucose-Capillary: 211 mg/dL — ABNORMAL HIGH (ref 70–99)
Glucose-Capillary: 217 mg/dL — ABNORMAL HIGH (ref 70–99)
Glucose-Capillary: 227 mg/dL — ABNORMAL HIGH (ref 70–99)
Glucose-Capillary: 257 mg/dL — ABNORMAL HIGH (ref 70–99)
Glucose-Capillary: 355 mg/dL — ABNORMAL HIGH (ref 70–99)
Glucose-Capillary: 458 mg/dL — ABNORMAL HIGH (ref 70–99)

## 2022-07-10 LAB — CBC
HCT: 38.2 % — ABNORMAL LOW (ref 39.0–52.0)
Hemoglobin: 12.5 g/dL — ABNORMAL LOW (ref 13.0–17.0)
MCH: 31.5 pg (ref 26.0–34.0)
MCHC: 32.7 g/dL (ref 30.0–36.0)
MCV: 96.2 fL (ref 80.0–100.0)
Platelets: 165 10*3/uL (ref 150–400)
RBC: 3.97 MIL/uL — ABNORMAL LOW (ref 4.22–5.81)
RDW: 13.9 % (ref 11.5–15.5)
WBC: 5.7 10*3/uL (ref 4.0–10.5)
nRBC: 0 % (ref 0.0–0.2)

## 2022-07-10 LAB — BETA-HYDROXYBUTYRIC ACID: Beta-Hydroxybutyric Acid: 0.96 mmol/L — ABNORMAL HIGH (ref 0.05–0.27)

## 2022-07-10 LAB — MRSA NEXT GEN BY PCR, NASAL: MRSA by PCR Next Gen: NOT DETECTED

## 2022-07-10 MED ORDER — ORAL CARE MOUTH RINSE: 15.0000 mL | OROMUCOSAL | Status: DC | PRN

## 2022-07-10 MED ORDER — INSULIN ASPART 100 UNIT/ML IJ SOLN
0.0000 [IU] | Freq: Three times a day (TID) | INTRAMUSCULAR | Status: DC
  Administered 2022-07-10 (×2): 5 [IU] via SUBCUTANEOUS

## 2022-07-10 MED ORDER — CARVEDILOL 12.5 MG PO TABS: 25.0000 mg | ORAL_TABLET | Freq: Every day | ORAL | Status: DC

## 2022-07-10 MED ORDER — LEVOTHYROXINE SODIUM 75 MCG PO TABS
75.0000 ug | ORAL_TABLET | Freq: Every day | ORAL | Status: DC
  Administered 2022-07-10: 75 ug via ORAL
  Filled 2022-07-10: qty 1

## 2022-07-10 MED ORDER — CHLORHEXIDINE GLUCONATE CLOTH 2 % EX PADS: 6.0000 | MEDICATED_PAD | Freq: Every day | CUTANEOUS | Status: DC

## 2022-07-10 MED ORDER — INSULIN ASPART 100 UNIT/ML IJ SOLN: 0.0000 [IU] | Freq: Every day | INTRAMUSCULAR | Status: DC

## 2022-07-10 MED ORDER — POTASSIUM CHLORIDE 20 MEQ PO PACK
40.0000 meq | PACK | Freq: Once | ORAL | Status: AC
Start: 2022-07-10 — End: 2022-07-10
  Administered 2022-07-10: 40 meq via ORAL
  Filled 2022-07-10: qty 2

## 2022-07-10 MED ORDER — ATORVASTATIN CALCIUM 40 MG PO TABS
40.0000 mg | ORAL_TABLET | Freq: Every day | ORAL | Status: DC
  Administered 2022-07-10: 40 mg via ORAL
  Filled 2022-07-10: qty 1

## 2022-07-10 MED ORDER — INSULIN ASPART 100 UNIT/ML IJ SOLN
3.0000 [IU] | Freq: Three times a day (TID) | INTRAMUSCULAR | Status: DC
  Administered 2022-07-10 (×2): 3 [IU] via SUBCUTANEOUS

## 2022-07-10 MED ORDER — ASPIRIN 81 MG PO CHEW
81.0000 mg | CHEWABLE_TABLET | Freq: Every day | ORAL | Status: DC
  Administered 2022-07-10: 81 mg via ORAL
  Filled 2022-07-10: qty 1

## 2022-07-10 MED ORDER — INSULIN GLARGINE-YFGN 100 UNIT/ML ~~LOC~~ SOLN
10.0000 [IU] | Freq: Every day | SUBCUTANEOUS | Status: DC
Start: 2022-07-10 — End: 2022-07-10
  Administered 2022-07-10: 10 [IU] via SUBCUTANEOUS
  Filled 2022-07-10: qty 0.1

## 2022-07-10 NOTE — Assessment & Plan Note (Signed)
Continue statin. 

## 2022-07-10 NOTE — Assessment & Plan Note (Signed)
S/p systemic chemotherapy currently on immunotherapy monthly -Follows with Dr. Earlie Server

## 2022-07-10 NOTE — Assessment & Plan Note (Signed)
-   Continue Coreg, hold Entresto due to AKI

## 2022-07-10 NOTE — Assessment & Plan Note (Addendum)
-   Creatinine elevated to 1.98 from baseline of 0.7. - Follow repeat after aggressive fluid resuscitation

## 2022-07-10 NOTE — Assessment & Plan Note (Signed)
Early DKA from not having insulin for 2-3 weeks. Reportedly had trouble getting in from [place].  - continue insulin gtt with goal of 140-180 and AG <12 - IV NS until BG <250, then switch to D5 1/2 NS  - BMP q4hr  - keep NPO

## 2022-07-10 NOTE — Inpatient Diabetes Management (Signed)
Inpatient Diabetes Program Recommendations  AACE/ADA: New Consensus Statement on Inpatient Glycemic Control (2015)  Target Ranges:  Prepandial:   less than 140 mg/dL      Peak postprandial:   less than 180 mg/dL (1-2 hours)      Critically ill patients:  140 - 180 mg/dL   Lab Results  Component Value Date   GLUCAP 211 (H) 07/10/2022   HGBA1C 12.9 (H) 06/22/2021    Review of Glycemic Control  Latest Reference Range & Units 07/10/22 08:31 07/10/22 10:08 07/10/22 10:38  Glucose-Capillary 70 - 99 mg/dL 147 (H) 206 (H) 211 (H)  (H): Data is abnormally high Diabetes history: Type 2 Dm Outpatient Diabetes medications: Lantus 10 units QD, Metformin 500 mg BID Current orders for Inpatient glycemic control: Novolog 0-15 units TID & HS, Novolog 3 units TID, Semglee 10 units QD  Inpatient Diabetes Program Recommendations:    Spoke with patient regarding outpatient diabetes management. Patient has not been taking insulin as he states, "I have struggled to keep insulin from the [place]".  Reviewed patient's current A1c of 12.9%. Explained what a A1c is and what it measures. Also reviewed goal A1c with patient, importance of good glucose control @ home, and blood sugar goals. Reviewed patho of DM, need for insulin, survival skills, interventions, vascular changes and commorbidities.  Patient has meter and testing supplies. He does not use this at home. Reviewed recommendations for checking blood sugars and when to call MD.  Patient reports having two insulin pens left and plans to see PCP at [facility] next week. Encouraged patient to discuss "back up plan" in the event patient does not have insulin. Reviewed Relion insulin and importance of taking medications as prescribed.  Thanks, Bronson Curb, MSN, RNC-OB Diabetes Coordinator [phone] (8a-5p)

## 2022-07-10 NOTE — Discharge Summary (Signed)
Physician Discharge Summary   Patient: Paul [name] MRN: [id number] DOB: 1946/07/22  Admit date:     07/09/2022  Discharge date: 07/10/22  Discharge Physician: Paul [name]   PCP: [name]     Recommendations at discharge:  Follow up with PCP in 1 week     Discharge Diagnoses: Principal Problem:   DKA (diabetic ketoacidosis) ([place]) Active Problems:   AKI (acute kidney injury) ([place])   Adenocarcinoma of right lung, stage 3 ([place])   Essential hypertension   Hyperlipidemia   Stage 3a chronic kidney disease (CKD) ([place])   Hyperkalemia   Hypokalemia      Hospital Course: Paul [name] is a 76 y.o. M with DM, NSCLC on immunotherapy HTN and CAD who presented with vomiting.  He reported to my partner he had run out of his insulin for 2 weeks and had been having vomiting for 2 weeks, he told me and the DM coordinator that he had several pens at home, he had just been forgetting to take his doses more and more frequently over the last 2 weeks and had noticed higher and higher sugars.  In the ER, Glucose >700, anion gap elevated, Cr elevated, BHOB elevated. Started on insulin and fulids and admitted.        * DKA (diabetic ketoacidosis) ([place]) Gap closed overnight and resumed subcutaneous insulin in the morning.    Evaluated by diabetes coordinator and discharge plan for insulin was arranged.  He reported that he did have supplies of his insulin at home.    He was able to take p.o., he was asymptomatic, and felt at his baseline, discharged with close PCP follow-up.      AKI (acute kidney injury) ([place]) Creatinine elevated to 1.98 from baseline of 0.7 admission, improved to 1.4.  Recommend close PCP follow up.   Entresto held at discharge.             The [facility] Controlled Substances Registry was reviewed for this patient prior to discharge.     Disposition: Home Diet recommendation: Diabetic   DISCHARGE MEDICATION: Allergies as of  07/10/2022   No Known Allergies      Medication List     STOP taking these medications    chlorpheniramine-HYDROcodone 10-8 MG/5ML Commonly known as: Tussionex Pennkinetic ER   folic acid 1 MG tablet Commonly known as: FOLVITE   sacubitril-valsartan 49-51 MG Commonly known as: ENTRESTO       TAKE these medications    ammonium lactate 12 % lotion Commonly known as: LAC-HYDRIN Apply 1 Application topically in the morning and at bedtime.   aspirin 81 MG chewable tablet Take 81 mg by mouth daily.   atorvastatin 40 MG tablet Commonly known as: LIPITOR Take 40 mg by mouth daily.   benzonatate 100 MG capsule Commonly known as: TESSALON Take 1 capsule (100 mg total) by mouth 3 (three) times daily as needed for cough.   blood glucose meter kit and supplies Kit Dispense based on patient and insurance preference. Use up to four times daily as directed.   carvedilol 25 MG tablet Commonly known as: COREG Take 25 mg by mouth at bedtime.   insulin glargine 100 UNIT/ML Solostar Pen Commonly known as: LANTUS Inject 10 Units into the skin daily.   levothyroxine 75 MCG tablet Commonly known as: Synthroid Take 1 tablet (75 mcg total) by mouth daily before breakfast.   lidocaine-prilocaine cream Commonly known as: EMLA Apply 1 application topically as needed.   metFORMIN 500  MG tablet Commonly known as: Glucophage Take 1 tablet (500 mg total) by mouth 2 (two) times daily with a meal.   multivitamin with minerals tablet Take 1 tablet by mouth daily.   Muscle Rub 10-15 % Crea Apply 1 application topically daily as needed for muscle pain.   nitroGLYCERIN 0.4 MG SL tablet Commonly known as: NITROSTAT Place 0.4 mg under the tongue every 5 (five) minutes as needed for chest pain.   prochlorperazine 10 MG tablet Commonly known as: COMPAZINE Take 1 tablet (10 mg total) by mouth every 6 (six) hours as needed for nausea or vomiting.   urea 20 % cream Commonly known as:  CARMOL Apply 1 Application topically 2 (two) times daily.        Follow-up Information     Clinic, [facility]. Schedule an appointment as soon as possible for a visit in 1 week(s).   Contact information: [place] [id number] [phone]                 Discharge Instructions     Discharge instructions   Complete by: As directed    From Dr. Loleta Books: You were admitted for high blood sugar and "DKA" or "diabetic ketoacidosis" This is when the blood sugars get out of control and you build up acid in your blood The only treatment is getting insulin and fluids, which is what we gave you here, and fixed the problem.  Go back to taking all your home medicines, as before with ONE EXCEPTION Do NOT take your Entresto for the next week Go see your primary care doctor and have your labs checked before you resume Entresto Resume your metformin tonight Take your insulin (Lantus) daily again starting tomorrow Try to avoid missing doses Check your blood sugar once daily in the morning and write it down  Go see your primary care in 1 week and show them the morning blood sugars   Increase activity slowly   Complete by: As directed        Discharge Exam: Filed Weights   07/10/22 0000  Weight: 103.8 kg    General: Pt is alert, awake, not in acute distress Cardiovascular: RRR, nl S1-S2, no murmurs appreciated.   No LE edema.   Respiratory: Normal respiratory rate and rhythm.  CTAB without rales or wheezes. Abdominal: Abdomen soft and non-tender.  No distension or HSM.   Neuro/Psych: Strength symmetric in upper and lower extremities.  Judgment and insight appear normal.   Condition at discharge: Good  The results of significant diagnostics from this hospitalization (including imaging, microbiology, ancillary and laboratory) are listed below for reference.   Imaging Studies: No results found.  Microbiology: Results for orders  placed or performed during the hospital encounter of 07/09/22  MRSA Next Gen by PCR, Nasal     Status: None   Collection Time: 07/10/22 12:24 AM   Specimen: Nasal Mucosa; Nasal Swab  Result Value Ref Range Status   MRSA by PCR Next Gen NOT DETECTED NOT DETECTED Final    Comment: (NOTE) The GeneXpert MRSA Assay (FDA approved for NASAL specimens only), is one component of a comprehensive MRSA colonization surveillance program. It is not intended to diagnose MRSA infection nor to guide or monitor treatment for MRSA infections. Test performance is not FDA approved in patients less than 86 years old. Performed at [facility], [place] [address], [place], [place] [address]     Labs: CBC: Recent Labs  Lab 07/09/22 1724  07/10/22 1142  WBC 3.8* 5.7  NEUTROABS 3.0  --   HGB 19.5* 12.5*  HCT 60.0* 38.2*  MCV 94.9 96.2  PLT 110* 346   Basic Metabolic Panel: Recent Labs  Lab 07/09/22 1724 07/10/22 0107 07/10/22 0640  NA 133* 141 144  K 5.5* 4.3 3.3*  CL 97* 107 111  CO2 23 23 27   GLUCOSE 764* 390* 151*  BUN 42* 35* 27*  CREATININE 1.98* 1.64* 1.42*  CALCIUM 10.5* 10.2 10.1   Liver Function Tests: Recent Labs  Lab 07/09/22 1724  AST 23  ALT 22  ALKPHOS 151*  BILITOT 0.7  PROT 7.8  ALBUMIN 4.0   CBG: Recent Labs  Lab 07/10/22 0730 07/10/22 0831 07/10/22 1008 07/10/22 1038 07/10/22 1342  GLUCAP 166* 147* 206* 211* 227*    Discharge time spent: approximately 35 minutes spent on discharge counseling, evaluation of patient on day of discharge, and coordination of discharge planning with nursing, social work, pharmacy and case management  Signed: Edwin Dada, MD [facility] 07/10/2022

## 2022-07-11 LAB — HEMOGLOBIN A1C
Hgb A1c MFr Bld: 14.3 % — ABNORMAL HIGH (ref 4.8–5.6)
Mean Plasma Glucose: 364 mg/dL

## 2022-07-21 ENCOUNTER — Other Ambulatory Visit: Payer: Self-pay

## 2022-07-24 NOTE — Progress Notes (Deleted)
Paul [name] [place] [address]  DIAGNOSIS: Stage IIIB (T3b, N2, M0) non-small cell lung cancer, adenocarcinoma presented with large right lower lobe lung mass and suspicious right lower paratracheal lymphadenopathy diagnosed in June 2022.   Molecular studies by [employer] showed no actionable mutations  PRIOR THERAPY: 1) Neoadjuvant systemic chemotherapy according to the Checkmate 816 with carboplatin for AUC of 5, Alimta 500 Mg/M2 and nivolumab 360 Mg IV every 3 weeks for 3 cycles.  Last dose 09/24/21.  Status post 3 cycles 2) 2) Concurrent chemoradiation with carboplatin for an AUC of 2 and paclitaxel 45 mg per metered square.  First dose expected on 11/04/2021.  Status post 7 cycles.    CURRENT THERAPY: Consolidation treatment with immunotherapy with Imfinzi 1500 Mg IV every 4 weeks.  First dose February 12, 2022.  Status post 5 cycles  INTERVAL HISTORY: Paul [name] 76 y.o. male returns to clinic today for follow-up visit.  The patient was last seen in the clinic on 06/04/2022.  The patient did not come to his appointment in July 2023 which is unusual for him.  He had been struggling to get his diabetes medication from the [place] and presented to the emergency room on 7/12 with hyperglycemia.  Since being seen in the emergency room ***.   Since being seen in the emergency room ***.   Also when he was last seen, the patient was endorsing a cough.  A chest x-ray was ordered but the patient never went to get this completed.  Since being seen his cough has ***.  Tussionex?  Tessalon?  He denies any fever, chills, night sweats, or unexplained weight loss.  Denies any chest pain or hemoptysis.  Dyspnea on exertion?  He denies any nausea, vomiting, diarrhea, or constipation.  Denies any rashes or skin changes.  Denies any headache or visual changes.  He is here today for evaluation and repeat blood work  before starting cycle #6.   MEDICAL HISTORY: Past Medical History:  Diagnosis Date   CHF (congestive heart failure) ([place])    Coronary artery disease    Diabetes mellitus without complication ([place])    History of radiation therapy    Right lung- 11/27/21-01/10/22- Dr. [name]   Hypertension    Hypothyroidism    Ischemic cardiomyopathy    Pneumonia    a long time ago   Sleep apnea    no longer uses a cpap    ALLERGIES:  has No Known Allergies.  MEDICATIONS:  Current Outpatient Medications  Medication Sig Dispense Refill   ammonium lactate (LAC-HYDRIN) 12 % lotion Apply 1 Application topically in the morning and at bedtime.     aspirin 81 MG chewable tablet Take 81 mg by mouth daily.     atorvastatin (LIPITOR) 40 MG tablet Take 40 mg by mouth daily.     benzonatate (TESSALON) 100 MG capsule Take 1 capsule (100 mg total) by mouth 3 (three) times daily as needed for cough. 30 capsule 2   blood glucose meter kit and supplies KIT Dispense based on patient and insurance preference. Use up to four times daily as directed. 1 each 0   carvedilol (COREG) 25 MG tablet Take 25 mg by mouth at bedtime.     insulin glargine (LANTUS) 100 UNIT/ML Solostar Pen Inject 10 Units into the skin daily. 5 mL 5   levothyroxine (SYNTHROID) 75 MCG tablet Take 1 tablet (75 mcg total) by mouth  daily before breakfast. 30 tablet 1   lidocaine-prilocaine (EMLA) cream Apply 1 application topically as needed. 30 g 2   Menthol-Methyl Salicylate (MUSCLE RUB) 10-15 % CREA Apply 1 application topically daily as needed for muscle pain.     metFORMIN (GLUCOPHAGE) 500 MG tablet Take 1 tablet (500 mg total) by mouth 2 (two) times daily with a meal. 60 tablet 3   Multiple Vitamins-Minerals (MULTIVITAMIN WITH MINERALS) tablet Take 1 tablet by mouth daily.     nitroGLYCERIN (NITROSTAT) 0.4 MG SL tablet Place 0.4 mg under the tongue every 5 (five) minutes as needed for chest pain. (Patient not taking: Reported on 05/07/2022)      prochlorperazine (COMPAZINE) 10 MG tablet Take 1 tablet (10 mg total) by mouth every 6 (six) hours as needed for nausea or vomiting. (Patient not taking: Reported on 03/11/2022) 30 tablet 0   urea (CARMOL) 20 % cream Apply 1 Application topically 2 (two) times daily.     No current facility-administered medications for this visit.    SURGICAL HISTORY:  Past Surgical History:  Procedure Laterality Date   BRONCHIAL BIOPSY  06/04/2021   Procedure: BRONCHIAL BIOPSIES;  Surgeon: Garner Nash, DO;  Location: [facility];  Service: Pulmonary;;   BRONCHIAL BRUSHINGS  06/04/2021   Procedure: BRONCHIAL BRUSHINGS;  Surgeon: Garner Nash, DO;  Location: [facility];  Service: Pulmonary;;   BRONCHIAL NEEDLE ASPIRATION BIOPSY  06/04/2021   Procedure: BRONCHIAL NEEDLE ASPIRATION BIOPSIES;  Surgeon: Garner Nash, DO;  Location: [facility];  Service: Pulmonary;;   BRONCHIAL WASHINGS  06/04/2021   Procedure: BRONCHIAL WASHINGS;  Surgeon: Garner Nash, DO;  Location: [facility];  Service: Pulmonary;;   CARDIAC SURGERY     COLONOSCOPY     CORONARY ARTERY BYPASS GRAFT     2019 at [facility] Right 06/04/2021   Procedure: [place];  Surgeon: Garner Nash, DO;  Location: [facility];  Service: Pulmonary;  Laterality: Right;   VIDEO BRONCHOSCOPY WITH ENDOBRONCHIAL ULTRASOUND  06/04/2021   Procedure: VIDEO BRONCHOSCOPY WITH ENDOBRONCHIAL ULTRASOUND;  Surgeon: Garner Nash, DO;  Location: [facility];  Service: Pulmonary;;    REVIEW OF SYSTEMS:   Review of Systems  Constitutional: Negative for appetite change, chills, fatigue, fever and unexpected weight change.  HENT:   Negative for mouth sores, nosebleeds, sore throat and trouble swallowing.   Eyes: Negative for eye problems and icterus.  Respiratory: Negative for cough, hemoptysis, shortness of breath and wheezing.   Cardiovascular: Negative  for chest pain and leg swelling.  Gastrointestinal: Negative for abdominal pain, constipation, diarrhea, nausea and vomiting.  Genitourinary: Negative for bladder incontinence, difficulty urinating, dysuria, frequency and hematuria.   Musculoskeletal: Negative for back pain, gait problem, neck pain and neck stiffness.  Skin: Negative for itching and rash.  Neurological: Negative for dizziness, extremity weakness, gait problem, headaches, light-headedness and seizures.  Hematological: Negative for adenopathy. Does not bruise/bleed easily.  Psychiatric/Behavioral: Negative for confusion, depression and sleep disturbance. The patient is not nervous/anxious.     PHYSICAL EXAMINATION:  There were no vitals taken for this visit.  ECOG PERFORMANCE STATUS: {CHL ONC ECOG [id number]  Physical Exam  Constitutional: Oriented to person, place, and time and well-developed, well-nourished, and in no distress. No distress.  HENT:  Head: Normocephalic and atraumatic.  Mouth/Throat: Oropharynx is clear and moist. No oropharyngeal exudate.  Eyes: Conjunctivae are normal. Right eye exhibits no discharge. Left eye exhibits no discharge. No  scleral icterus.  Neck: Normal range of motion. Neck supple.  Cardiovascular: Normal rate, regular rhythm, normal heart sounds and intact distal pulses.   Pulmonary/Chest: Effort normal and breath sounds normal. No respiratory distress. No wheezes. No rales.  Abdominal: Soft. Bowel sounds are normal. Exhibits no distension and no mass. There is no tenderness.  Musculoskeletal: Normal range of motion. Exhibits no edema.  Lymphadenopathy:    No cervical adenopathy.  Neurological: Alert and oriented to person, place, and time. Exhibits normal muscle tone. Gait normal. Coordination normal.  Skin: Skin is warm and dry. No rash noted. Not diaphoretic. No erythema. No pallor.  Psychiatric: Mood, memory and judgment normal.  Vitals reviewed.  LABORATORY DATA: Lab Results   Component Value Date   WBC 5.7 07/10/2022   HGB 12.5 (L) 07/10/2022   HCT 38.2 (L) 07/10/2022   MCV 96.2 07/10/2022   PLT 165 07/10/2022      Chemistry      Component Value Date/Time   NA 144 07/10/2022 0640   K 3.3 (L) 07/10/2022 0640   CL 111 07/10/2022 0640   CO2 27 07/10/2022 0640   BUN 27 (H) 07/10/2022 0640   CREATININE 1.42 (H) 07/10/2022 0640   CREATININE 1.26 (H) 06/04/2022 1030      Component Value Date/Time   CALCIUM 10.1 07/10/2022 0640   ALKPHOS 151 (H) 07/09/2022 1724   AST 23 07/09/2022 1724   AST 15 06/04/2022 1030   ALT 22 07/09/2022 1724   ALT 12 06/04/2022 1030   BILITOT 0.7 07/09/2022 1724   BILITOT 0.3 06/04/2022 1030       RADIOGRAPHIC STUDIES:  No results found.   ASSESSMENT/PLAN:  This is a very pleasant 75 year old African-American male diagnosed with stage IIIb (T3b, N2, M0) non-small cell lung cancer, adenocarcinoma.  He presented with a large right upper lobe lung mass and suspicious right lower lobe paratracheal lymphadenopathy.  He was diagnosed in June 2022.  His molecular studies by [employer] were negative for any actionable mutations.   The patient completed 3 cycles of neoadjuvant chemotherapy according to the Checkmate 816 trial with carboplatin for an AUC of 5, Alimta 500 mg per metered square, and nivolumab 360 mg IV every 3 weeks.  The patient status post 3 cycles.  His last dose was on 09/24/2021.  Upon reimaging after completion of this course, there was not significant improvement in the size of his mass and therefore, it does not appear that we are able to downstage his disease to make him eligible for surgical resection.   He completed a course of concurrent chemoradiation with weekly carboplatin for AUC of 2 and paclitaxel 45 Mg/M2 status post 7 cycles.  Last cycle was given January 02, 2022.     The patient is currently on consolidation treatment with immunotherapy with Imfinzi 1500 Mg IV every 4 weeks status post 5 cycles.     The patient has not been seen in the clinic since 06/04/2022.  The patient has been ***  The patient was seen with Dr. Julien Nordmann today.  Dr. Julien Nordmann had lengthy discussion with the patient today about ***   Labs reviewed.  Recommend that he ***.   I will arrange for restaging CT scan the chest without contrast prior to starting his neck cycle of treatment.  Chest x-ray?  [facility]  The patient was advised to call immediately if he has any concerning symptoms in the interval. The patient voices understanding of current disease status and treatment options  and is in agreement with the current care plan. All questions were answered. The patient knows to call the clinic with any problems, questions or concerns. We can certainly see the patient much sooner if necessary          No orders of the defined types were placed in this encounter.    I spent {CHL ONC TIME VISIT - AIDKS:[id number]} counseling the patient face to face. The total time spent in the appointment was {CHL ONC TIME VISIT - ADGNP:[id number]}.  Paul Kimberlin L Aprill Banko, PA-C 07/24/22

## 2022-07-25 ENCOUNTER — Other Ambulatory Visit: Payer: Self-pay

## 2022-07-30 ENCOUNTER — Inpatient Hospital Stay: Payer: No Typology Code available for payment source | Attending: Internal Medicine

## 2022-07-30 ENCOUNTER — Inpatient Hospital Stay: Payer: No Typology Code available for payment source

## 2022-07-30 ENCOUNTER — Telehealth: Payer: Self-pay

## 2022-07-30 ENCOUNTER — Inpatient Hospital Stay: Payer: No Typology Code available for payment source | Admitting: Physician Assistant

## 2022-07-30 ENCOUNTER — Telehealth: Payer: Self-pay | Admitting: Physician Assistant

## 2022-07-30 DIAGNOSIS — Z5112 Encounter for antineoplastic immunotherapy: Secondary | ICD-10-CM | POA: Insufficient documentation

## 2022-07-30 DIAGNOSIS — Z79899 Other long term (current) drug therapy: Secondary | ICD-10-CM | POA: Insufficient documentation

## 2022-07-30 DIAGNOSIS — I110 Hypertensive heart disease with heart failure: Secondary | ICD-10-CM | POA: Insufficient documentation

## 2022-07-30 DIAGNOSIS — I509 Heart failure, unspecified: Secondary | ICD-10-CM | POA: Insufficient documentation

## 2022-07-30 DIAGNOSIS — E1165 Type 2 diabetes mellitus with hyperglycemia: Secondary | ICD-10-CM | POA: Insufficient documentation

## 2022-07-30 DIAGNOSIS — C3431 Malignant neoplasm of lower lobe, right bronchus or lung: Secondary | ICD-10-CM | POA: Insufficient documentation

## 2022-07-30 NOTE — Telephone Encounter (Signed)
Pt missed his appts again today. I have attempted thee following:  Home number is out of service LVM on his cell number Called his son, Aggie Hacker, and his VM box is full Called his daughter, Olivia Mackie, and LVM for her as well

## 2022-07-30 NOTE — Telephone Encounter (Signed)
Patient called in and states that he has been in the hospital for dehydration, so he could not come in for appointment that was scheduled today. I advised patient on rescheduling the appointment, patient insisted on not rescheduling appointment and just returning on already scheduled appointment on 08/31. He states he wants to get better and gain his strength back from being dehydrated. I let patient know I will chart this information and to reach out for any concerns.

## 2022-08-27 ENCOUNTER — Inpatient Hospital Stay: Payer: No Typology Code available for payment source

## 2022-08-27 ENCOUNTER — Other Ambulatory Visit: Payer: Self-pay | Admitting: Internal Medicine

## 2022-08-27 ENCOUNTER — Other Ambulatory Visit: Payer: Self-pay

## 2022-08-27 ENCOUNTER — Inpatient Hospital Stay (HOSPITAL_BASED_OUTPATIENT_CLINIC_OR_DEPARTMENT_OTHER): Payer: No Typology Code available for payment source | Admitting: Internal Medicine

## 2022-08-27 VITALS — BP 122/65 | HR 89 | Temp 97.0°F | Resp 15 | Ht 68.0 in | Wt 229.6 lb

## 2022-08-27 VITALS — BP 130/70 | HR 75 | Temp 97.8°F | Resp 18

## 2022-08-27 DIAGNOSIS — I110 Hypertensive heart disease with heart failure: Secondary | ICD-10-CM | POA: Diagnosis not present

## 2022-08-27 DIAGNOSIS — E1165 Type 2 diabetes mellitus with hyperglycemia: Secondary | ICD-10-CM | POA: Diagnosis not present

## 2022-08-27 DIAGNOSIS — Z5112 Encounter for antineoplastic immunotherapy: Secondary | ICD-10-CM

## 2022-08-27 DIAGNOSIS — C3491 Malignant neoplasm of unspecified part of right bronchus or lung: Secondary | ICD-10-CM

## 2022-08-27 DIAGNOSIS — I509 Heart failure, unspecified: Secondary | ICD-10-CM | POA: Diagnosis not present

## 2022-08-27 DIAGNOSIS — E039 Hypothyroidism, unspecified: Secondary | ICD-10-CM

## 2022-08-27 DIAGNOSIS — E119 Type 2 diabetes mellitus without complications: Secondary | ICD-10-CM | POA: Diagnosis not present

## 2022-08-27 DIAGNOSIS — C3431 Malignant neoplasm of lower lobe, right bronchus or lung: Secondary | ICD-10-CM

## 2022-08-27 DIAGNOSIS — C3490 Malignant neoplasm of unspecified part of unspecified bronchus or lung: Secondary | ICD-10-CM

## 2022-08-27 DIAGNOSIS — Z79899 Other long term (current) drug therapy: Secondary | ICD-10-CM | POA: Diagnosis not present

## 2022-08-27 LAB — CMP (CANCER CENTER ONLY)
ALT: 8 U/L (ref 0–44)
AST: 15 U/L (ref 15–41)
Albumin: 3.7 g/dL (ref 3.5–5.0)
Alkaline Phosphatase: 107 U/L (ref 38–126)
Anion gap: 6 (ref 5–15)
BUN: 20 mg/dL (ref 8–23)
CO2: 27 mmol/L (ref 22–32)
Calcium: 8.7 mg/dL — ABNORMAL LOW (ref 8.9–10.3)
Chloride: 101 mmol/L (ref 98–111)
Creatinine: 1.56 mg/dL — ABNORMAL HIGH (ref 0.61–1.24)
GFR, Estimated: 45 mL/min — ABNORMAL LOW (ref >60)
Glucose, Bld: 425 mg/dL — ABNORMAL HIGH (ref 70–99)
Potassium: 3.6 mmol/L (ref 3.5–5.1)
Sodium: 134 mmol/L — ABNORMAL LOW (ref 135–145)
Total Bilirubin: 0.7 mg/dL (ref 0.3–1.2)
Total Protein: 6.7 g/dL (ref 6.5–8.1)

## 2022-08-27 LAB — CBC WITH DIFFERENTIAL (CANCER CENTER ONLY)
Abs Immature Granulocytes: 0.02 10*3/uL (ref 0.00–0.07)
Basophils Absolute: 0 10*3/uL (ref 0.0–0.1)
Basophils Relative: 1 %
Eosinophils Absolute: 0.1 10*3/uL (ref 0.0–0.5)
Eosinophils Relative: 1 %
HCT: 35.2 % — ABNORMAL LOW (ref 39.0–52.0)
Hemoglobin: 12 g/dL — ABNORMAL LOW (ref 13.0–17.0)
Immature Granulocytes: 0 %
Lymphocytes Relative: 21 %
Lymphs Abs: 1 10*3/uL (ref 0.7–4.0)
MCH: 31.9 pg (ref 26.0–34.0)
MCHC: 34.1 g/dL (ref 30.0–36.0)
MCV: 93.6 fL (ref 80.0–100.0)
Monocytes Absolute: 0.7 10*3/uL (ref 0.1–1.0)
Monocytes Relative: 13 %
Neutro Abs: 3.2 10*3/uL (ref 1.7–7.7)
Neutrophils Relative %: 64 %
Platelet Count: 205 10*3/uL (ref 150–400)
RBC: 3.76 MIL/uL — ABNORMAL LOW (ref 4.22–5.81)
RDW: 15.3 % (ref 11.5–15.5)
WBC Count: 5 10*3/uL (ref 4.0–10.5)
nRBC: 0 % (ref 0.0–0.2)

## 2022-08-27 LAB — TSH: TSH: 48.557 u[IU]/mL — ABNORMAL HIGH (ref 0.350–4.500)

## 2022-08-27 MED ORDER — LEVOTHYROXINE SODIUM 100 MCG PO TABS: 100.0000 ug | ORAL_TABLET | Freq: Every day | ORAL | 2 refills | Status: DC

## 2022-08-27 MED ORDER — SODIUM CHLORIDE 0.9 % IV SOLN
1500.0000 mg | Freq: Once | INTRAVENOUS | Status: AC
  Administered 2022-08-27: 1500 mg via INTRAVENOUS
  Filled 2022-08-27: qty 30

## 2022-08-27 MED ORDER — INSULIN ASPART 100 UNIT/ML IJ SOLN
10.0000 [IU] | Freq: Once | INTRAMUSCULAR | Status: AC
  Administered 2022-08-27: 10 [IU] via SUBCUTANEOUS
  Filled 2022-08-27: qty 1

## 2022-08-27 MED ORDER — SODIUM CHLORIDE 0.9 % IV SOLN: Freq: Once | INTRAVENOUS | Status: AC

## 2022-08-27 NOTE — Progress Notes (Signed)
[facility] [phone]   Fax:([phone]) [place] [address]  DIAGNOSIS: Stage IIIB (T3b, N2, M0) non-small cell lung cancer, adenocarcinoma presented with large right lower lobe lung mass and suspicious right lower paratracheal lymphadenopathy diagnosed in June 2022.  Molecular studies by [employer] showed no actionable mutations  PRIOR THERAPY:  1) Neoadjuvant systemic chemotherapy according to the Checkmate 816 with carboplatin for AUC of 5, Alimta 500 Mg/M2 and nivolumab 360 Mg IV every 3 weeks for 3 cycles.  Last dose 09/24/21.  Status post 3 cycles.  2) Concurrent chemoradiation with carboplatin for an AUC of 2 and paclitaxel 45 mg per metered square.  First dose expected on 11/04/2021.  Status post 7 cycles.   CURRENT THERAPY: Consolidation treatment with immunotherapy with Imfinzi 1500 Mg IV every 4 weeks.  First dose February 12, 2022.  Status post 5 cycles.  INTERVAL HISTORY: Paul [name] 76 y.o. male returns to the clinic today for follow-up visit.  The patient is feeling fine today with no concerning complaints.  He was admitted to [facility] in [place] with hyperglycemia.  The patient has uncontrolled diabetes mellitus.  He is followed by the [facility] facility for treatment of his condition.  He missed the last dose of his treatment with immunotherapy.  He denied having any current chest pain, shortness of breath, cough or hemoptysis.  He has no nausea, vomiting, diarrhea or constipation.  He has no headache or visual changes.  He denied having any recent weight loss or night sweats.  He is here today for evaluation before starting cycle #6 of his treatment.  MEDICAL HISTORY: Past Medical History:  Diagnosis Date   CHF (congestive heart failure) ([place])    Coronary artery disease    Diabetes mellitus without complication ([place])    History of radiation  therapy    Right lung- 11/27/21-01/10/22- Dr. [name]   Hypertension    Hypothyroidism    Ischemic cardiomyopathy    Pneumonia    a long time ago   Sleep apnea    no longer uses a cpap    ALLERGIES:  has No Known Allergies.  MEDICATIONS:  Current Outpatient Medications  Medication Sig Dispense Refill   ammonium lactate (LAC-HYDRIN) 12 % lotion Apply 1 Application topically in the morning and at bedtime.     aspirin 81 MG chewable tablet Take 81 mg by mouth daily.     atorvastatin (LIPITOR) 40 MG tablet Take 40 mg by mouth daily.     benzonatate (TESSALON) 100 MG capsule Take 1 capsule (100 mg total) by mouth 3 (three) times daily as needed for cough. 30 capsule 2   blood glucose meter kit and supplies KIT Dispense based on patient and insurance preference. Use up to four times daily as directed. 1 each 0   carvedilol (COREG) 25 MG tablet Take 25 mg by mouth at bedtime.     insulin glargine (LANTUS) 100 UNIT/ML Solostar Pen Inject 10 Units into the skin daily. 5 mL 5   levothyroxine (SYNTHROID) 75 MCG tablet Take 1 tablet (75 mcg total) by mouth daily before breakfast. 30 tablet 1   lidocaine-prilocaine (EMLA) cream Apply 1 application topically as needed. 30 g 2   Menthol-Methyl Salicylate (MUSCLE RUB) 10-15 % CREA Apply 1 application topically daily as needed for muscle pain.     metFORMIN (GLUCOPHAGE) 500 MG tablet Take 1 tablet (500 mg  total) by mouth 2 (two) times daily with a meal. 60 tablet 3   Multiple Vitamins-Minerals (MULTIVITAMIN WITH MINERALS) tablet Take 1 tablet by mouth daily.     nitroGLYCERIN (NITROSTAT) 0.4 MG SL tablet Place 0.4 mg under the tongue every 5 (five) minutes as needed for chest pain. (Patient not taking: Reported on 05/07/2022)     prochlorperazine (COMPAZINE) 10 MG tablet Take 1 tablet (10 mg total) by mouth every 6 (six) hours as needed for nausea or vomiting. (Patient not taking: Reported on 03/11/2022) 30 tablet 0   urea (CARMOL) 20 % cream Apply 1  Application topically 2 (two) times daily.     No current facility-administered medications for this visit.    SURGICAL HISTORY:  Past Surgical History:  Procedure Laterality Date   BRONCHIAL BIOPSY  06/04/2021   Procedure: BRONCHIAL BIOPSIES;  Surgeon: Garner Nash, DO;  Location: [facility];  Service: Pulmonary;;   BRONCHIAL BRUSHINGS  06/04/2021   Procedure: BRONCHIAL BRUSHINGS;  Surgeon: Garner Nash, DO;  Location: [facility];  Service: Pulmonary;;   BRONCHIAL NEEDLE ASPIRATION BIOPSY  06/04/2021   Procedure: BRONCHIAL NEEDLE ASPIRATION BIOPSIES;  Surgeon: Garner Nash, DO;  Location: [facility];  Service: Pulmonary;;   BRONCHIAL WASHINGS  06/04/2021   Procedure: BRONCHIAL WASHINGS;  Surgeon: Garner Nash, DO;  Location: [facility];  Service: Pulmonary;;   CARDIAC SURGERY     COLONOSCOPY     CORONARY ARTERY BYPASS GRAFT     2019 at [facility] Right 06/04/2021   Procedure: [place];  Surgeon: Garner Nash, DO;  Location: [facility];  Service: Pulmonary;  Laterality: Right;   VIDEO BRONCHOSCOPY WITH ENDOBRONCHIAL ULTRASOUND  06/04/2021   Procedure: VIDEO BRONCHOSCOPY WITH ENDOBRONCHIAL ULTRASOUND;  Surgeon: Garner Nash, DO;  Location: [facility];  Service: Pulmonary;;    REVIEW OF SYSTEMS:  Constitutional: positive for fatigue Eyes: negative Ears, nose, mouth, throat, and face: negative Respiratory: negative Cardiovascular: negative Gastrointestinal: negative Genitourinary:negative Integument/breast: negative Hematologic/lymphatic: negative Musculoskeletal:positive for arthralgias Neurological: positive for paresthesia Behavioral/Psych: negative Endocrine: negative Allergic/Immunologic: negative   PHYSICAL EXAMINATION: General appearance: alert, cooperative, fatigued, and no distress Head: Normocephalic, without obvious abnormality, atraumatic Neck: no  adenopathy, no JVD, supple, symmetrical, trachea midline, and thyroid not enlarged, symmetric, no tenderness/mass/nodules Lymph nodes: Cervical, supraclavicular, and axillary nodes normal. Resp: clear to auscultation bilaterally Back: symmetric, no curvature. ROM normal. No CVA tenderness. Cardio: regular rate and rhythm, S1, S2 normal, no murmur, click, rub or gallop GI: soft, non-tender; bowel sounds normal; no masses,  no organomegaly Extremities: extremities normal, atraumatic, no cyanosis or edema Neurologic: Alert and oriented X 3, normal strength and tone. Normal symmetric reflexes. Normal coordination and gait  ECOG PERFORMANCE STATUS: 1 - Symptomatic but completely ambulatory  Blood pressure 122/65, pulse 89, temperature (!) 97 F (36.1 C), temperature source Oral, resp. rate 15, height 5' 8"  (1.727 m), weight 229 lb 9.6 oz (104.1 kg), SpO2 99 %.  LABORATORY DATA: Lab Results  Component Value Date   WBC 5.0 08/27/2022   HGB 12.0 (L) 08/27/2022   HCT 35.2 (L) 08/27/2022   MCV 93.6 08/27/2022   PLT 205 08/27/2022      Chemistry      Component Value Date/Time   NA 144 07/10/2022 0640   K 3.3 (L) 07/10/2022 0640   CL 111 07/10/2022 0640   CO2 27 07/10/2022 0640   BUN 27 (H) 07/10/2022 8309  CREATININE 1.42 (H) 07/10/2022 0640   CREATININE 1.26 (H) 06/04/2022 1030      Component Value Date/Time   CALCIUM 10.1 07/10/2022 0640   ALKPHOS 151 (H) 07/09/2022 1724   AST 23 07/09/2022 1724   AST 15 06/04/2022 1030   ALT 22 07/09/2022 1724   ALT 12 06/04/2022 1030   BILITOT 0.7 07/09/2022 1724   BILITOT 0.3 06/04/2022 1030       RADIOGRAPHIC STUDIES: No results found.  ASSESSMENT AND PLAN: This is a very pleasant 76 years old African-American male diagnosed with a stage IIIB (T3b, N2, M0) non-small cell lung cancer, adenocarcinoma presented with large right lower lobe lung mass and suspicious right lower lobe paratracheal lymphadenopathy diagnosed in June 2022. Has  MRI of the brain is still pending. Molecular studies by [employer] showed no actionable mutations. The patient underwent neoadjuvant chemotherapy according to the Checkmate 816 with carboplatin for AUC of 5, Alimta 500 Mg/M2 and nivolumab 360 Mg IV every 3 weeks.  Status post 3 cycles. The patient continues to tolerate his treatment well with no concerning adverse effects.  Repeat imaging studies after the neoadjuvant treatment showed stable disease and the patient was not a good candidate for surgical resection. He is currently undergoing a course of concurrent chemoradiation with weekly carboplatin for AUC of 2 and paclitaxel 45 Mg/M2 status post 7 cycles.  Last cycle was given January 02, 2022.   The patient tolerated the previous course of concurrent chemoradiation fairly well except for odynophagia and cough. His scan showed stable disease with no significant evidence of disease progression. The patient is currently on consolidation treatment with immunotherapy with Imfinzi 1500 Mg IV every 4 weeks status post 5 cycles.   The patient has been tolerating this treatment well with no concerning adverse effects. I recommended for him to proceed with cycle #6 today as planned. I will see him back for follow-up visit in 4 weeks for evaluation with repeat CT scan of the chest for restaging of his disease. For his diabetes mellitus and hypertension, he is followed by the [facility] facility and currently is well and he was recently admitted for treatment of hyperglycemia.  His blood sugar is elevated today and we will arrange for the patient to receive 10 units of regular insulin in the chemotherapy infusion area today. For the hypertension he was advised to monitor it closely at home and to discuss with his primary care physician for any adjustment of his medication. The patient was advised to call immediately if he has any other concerning symptoms in the interval. The patient voices understanding of current  disease status and treatment options and is in agreement with the current care plan.  All questions were answered. The patient knows to call the clinic with any problems, questions or concerns. We can certainly see the patient much sooner if necessary.  The total time spent in the appointment was 30 minutes.  Disclaimer: This note was dictated with voice recognition software. Similar sounding words can inadvertently be transcribed and may not be corrected upon review.

## 2022-08-27 NOTE — Patient Instructions (Signed)
[name] ONCOLOGY   Discharge Instructions: Thank you for choosing [facility] to provide your oncology and hematology care.   If you have a lab appointment with the [facility], please go directly to the [facility] and check in at the registration area.   Wear comfortable clothing and clothing appropriate for easy access to any Portacath or PICC line.   We strive to give you quality time with your provider. You may need to reschedule your appointment if you arrive late (15 or more minutes).  Arriving late affects you and other patients whose appointments are after yours.  Also, if you miss three or more appointments without notifying the office, you may be dismissed from the clinic at the provider's discretion.      For prescription refill requests, have your pharmacy contact our office and allow 72 hours for refills to be completed.    Today you received the following chemotherapy and/or immunotherapy agents: durvalumab (Imfinzi)   To help prevent nausea and vomiting after your treatment, we encourage you to take your nausea medication as directed.  BELOW ARE SYMPTOMS THAT SHOULD BE REPORTED IMMEDIATELY: *FEVER GREATER THAN 100.4 F (38 C) OR HIGHER *CHILLS OR SWEATING *NAUSEA AND VOMITING THAT IS NOT CONTROLLED WITH YOUR NAUSEA MEDICATION *UNUSUAL SHORTNESS OF BREATH *UNUSUAL BRUISING OR BLEEDING *URINARY PROBLEMS (pain or burning when urinating, or frequent urination) *BOWEL PROBLEMS (unusual diarrhea, constipation, pain near the anus) TENDERNESS IN MOUTH AND THROAT WITH OR WITHOUT PRESENCE OF ULCERS (sore throat, sores in mouth, or a toothache) UNUSUAL RASH, SWELLING OR PAIN  UNUSUAL VAGINAL DISCHARGE OR ITCHING   Items with * indicate a potential emergency and should be followed up as soon as possible or go to the Emergency Department if any problems should occur.  Please show the CHEMOTHERAPY ALERT CARD or IMMUNOTHERAPY ALERT CARD at  check-in to the Emergency Department and triage nurse.  Should you have questions after your visit or need to cancel or reschedule your appointment, please contact [facility]  Dept: [phone]  and follow the prompts.  Office hours are 8:00 a.m. to 4:30 p.m. Monday - Friday. Please note that voicemails left after 4:00 p.m. may not be returned until the following business day.  We are closed weekends and major holidays. You have access to a nurse at all times for urgent questions. Please call the main number to the clinic Dept: [phone] and follow the prompts.   For any non-urgent questions, you may also contact your provider using MyChart. We now offer e-Visits for anyone 39 and older to request care online for non-urgent symptoms. For details visit [email or web address].   Also download the MyChart app! Go to the app store, search "MyChart", open the app, select [facility], and log in with your MyChart username and password.  Due to Covid, a mask is required upon entering the hospital/clinic. If you do not have a mask, one will be given to you upon arrival. For doctor visits, patients may have 1 support person aged 39 or older with them. For treatment visits, patients cannot have anyone with them due to current Covid guidelines and our immunocompromised population.

## 2022-08-28 ENCOUNTER — Telehealth: Payer: Self-pay | Admitting: *Deleted

## 2022-08-28 ENCOUNTER — Other Ambulatory Visit: Payer: Self-pay

## 2022-08-28 NOTE — Telephone Encounter (Addendum)
-----   Message from Curt Bears, MD sent at 08/27/2022  4:50 PM EDT ----- Please let the patient know that his TSH was high and I increased his dose of levothyroxine to 100 mcg p.o. daily and sent prescription to the [facility] in [place].   Contacted patient with information in above message. Advised patient that once he had new Levothyroxine 100 mcg tablets to take one of them every morning and to stop current dose of daily Levothyroxine 75 mcg. Advised him he is to take just ONE Levothyroxine tablet daily and the new dose is 100 mcg. Repeated these instructions x 3.  Patient verbalized understanding and said he has appt at [facility] tomorrow and would pick up med tomorrow at [facility]. Encouraged patient to contact office for further questions.

## 2022-08-29 ENCOUNTER — Other Ambulatory Visit: Payer: Self-pay

## 2022-08-29 ENCOUNTER — Telehealth: Payer: Self-pay | Admitting: Internal Medicine

## 2022-08-29 NOTE — Telephone Encounter (Signed)
Called patient regarding upcoming September appointments, patient is notified.

## 2022-09-04 ENCOUNTER — Other Ambulatory Visit: Payer: Self-pay

## 2022-09-11 ENCOUNTER — Other Ambulatory Visit: Payer: Self-pay

## 2022-09-22 ENCOUNTER — Ambulatory Visit (HOSPITAL_COMMUNITY)
Admission: RE | Admit: 2022-09-22 | Discharge: 2022-09-22 | Disposition: A | Discharge status: Home / Self Care | Payer: No Typology Code available for payment source | Source: Ambulatory Visit | Attending: Internal Medicine | Admitting: Internal Medicine

## 2022-09-22 DIAGNOSIS — C3490 Malignant neoplasm of unspecified part of unspecified bronchus or lung: Secondary | ICD-10-CM | POA: Diagnosis not present

## 2022-09-24 ENCOUNTER — Other Ambulatory Visit: Payer: Self-pay

## 2022-09-24 ENCOUNTER — Inpatient Hospital Stay (HOSPITAL_BASED_OUTPATIENT_CLINIC_OR_DEPARTMENT_OTHER): Payer: No Typology Code available for payment source | Admitting: Internal Medicine

## 2022-09-24 ENCOUNTER — Inpatient Hospital Stay: Payer: No Typology Code available for payment source

## 2022-09-24 ENCOUNTER — Inpatient Hospital Stay: Payer: No Typology Code available for payment source | Attending: Internal Medicine

## 2022-09-24 ENCOUNTER — Inpatient Hospital Stay: Payer: No Typology Code available for payment source | Admitting: Internal Medicine

## 2022-09-24 ENCOUNTER — Other Ambulatory Visit: Payer: Self-pay | Admitting: Internal Medicine

## 2022-09-24 ENCOUNTER — Telehealth: Payer: Self-pay

## 2022-09-24 VITALS — BP 127/78 | HR 84 | Temp 98.0°F | Resp 15 | Wt 231.6 lb

## 2022-09-24 VITALS — BP 124/79 | HR 70 | Temp 97.8°F | Resp 17

## 2022-09-24 DIAGNOSIS — C3491 Malignant neoplasm of unspecified part of right bronchus or lung: Secondary | ICD-10-CM

## 2022-09-24 DIAGNOSIS — I509 Heart failure, unspecified: Secondary | ICD-10-CM | POA: Insufficient documentation

## 2022-09-24 DIAGNOSIS — E119 Type 2 diabetes mellitus without complications: Secondary | ICD-10-CM | POA: Insufficient documentation

## 2022-09-24 DIAGNOSIS — I110 Hypertensive heart disease with heart failure: Secondary | ICD-10-CM | POA: Diagnosis not present

## 2022-09-24 DIAGNOSIS — C3431 Malignant neoplasm of lower lobe, right bronchus or lung: Secondary | ICD-10-CM | POA: Insufficient documentation

## 2022-09-24 DIAGNOSIS — Z79899 Other long term (current) drug therapy: Secondary | ICD-10-CM | POA: Diagnosis not present

## 2022-09-24 DIAGNOSIS — E039 Hypothyroidism, unspecified: Secondary | ICD-10-CM | POA: Diagnosis not present

## 2022-09-24 DIAGNOSIS — Z5112 Encounter for antineoplastic immunotherapy: Secondary | ICD-10-CM | POA: Diagnosis present

## 2022-09-24 LAB — CMP (CANCER CENTER ONLY)
ALT: 6 U/L (ref 0–44)
AST: 14 U/L — ABNORMAL LOW (ref 15–41)
Albumin: 3.9 g/dL (ref 3.5–5.0)
Alkaline Phosphatase: 102 U/L (ref 38–126)
Anion gap: 7 (ref 5–15)
BUN: 20 mg/dL (ref 8–23)
CO2: 32 mmol/L (ref 22–32)
Calcium: 9.3 mg/dL (ref 8.9–10.3)
Chloride: 102 mmol/L (ref 98–111)
Creatinine: 1.52 mg/dL — ABNORMAL HIGH (ref 0.61–1.24)
GFR, Estimated: 47 mL/min — ABNORMAL LOW (ref >60)
Glucose, Bld: 126 mg/dL — ABNORMAL HIGH (ref 70–99)
Potassium: 3.8 mmol/L (ref 3.5–5.1)
Sodium: 141 mmol/L (ref 135–145)
Total Bilirubin: 0.5 mg/dL (ref 0.3–1.2)
Total Protein: 7.7 g/dL (ref 6.5–8.1)

## 2022-09-24 LAB — CBC WITH DIFFERENTIAL (CANCER CENTER ONLY)
Abs Immature Granulocytes: 0.02 10*3/uL (ref 0.00–0.07)
Basophils Absolute: 0.1 10*3/uL (ref 0.0–0.1)
Basophils Relative: 1 %
Eosinophils Absolute: 0.2 10*3/uL (ref 0.0–0.5)
Eosinophils Relative: 2 %
HCT: 37.6 % — ABNORMAL LOW (ref 39.0–52.0)
Hemoglobin: 12.2 g/dL — ABNORMAL LOW (ref 13.0–17.0)
Immature Granulocytes: 0 %
Lymphocytes Relative: 17 %
Lymphs Abs: 1.2 10*3/uL (ref 0.7–4.0)
MCH: 31.2 pg (ref 26.0–34.0)
MCHC: 32.4 g/dL (ref 30.0–36.0)
MCV: 96.2 fL (ref 80.0–100.0)
Monocytes Absolute: 0.7 10*3/uL (ref 0.1–1.0)
Monocytes Relative: 10 %
Neutro Abs: 4.9 10*3/uL (ref 1.7–7.7)
Neutrophils Relative %: 70 %
Platelet Count: 275 10*3/uL (ref 150–400)
RBC: 3.91 MIL/uL — ABNORMAL LOW (ref 4.22–5.81)
RDW: 14.6 % (ref 11.5–15.5)
WBC Count: 7.1 10*3/uL (ref 4.0–10.5)
nRBC: 0 % (ref 0.0–0.2)

## 2022-09-24 LAB — TSH: TSH: 47.837 u[IU]/mL — ABNORMAL HIGH (ref 0.350–4.500)

## 2022-09-24 MED ORDER — SODIUM CHLORIDE 0.9 % IV SOLN
1500.0000 mg | Freq: Once | INTRAVENOUS | Status: AC
  Administered 2022-09-24: 1500 mg via INTRAVENOUS
  Filled 2022-09-24: qty 30

## 2022-09-24 MED ORDER — LEVOTHYROXINE SODIUM 100 MCG PO TABS: 100.0000 ug | ORAL_TABLET | Freq: Every day | ORAL | 2 refills | Status: DC

## 2022-09-24 MED ORDER — SODIUM CHLORIDE 0.9 % IV SOLN: Freq: Once | INTRAVENOUS | Status: AC

## 2022-09-24 NOTE — Progress Notes (Signed)
[facility] [phone]   Fax:([phone]) [place] [phone]  DIAGNOSIS: Stage IIIB (T3b, N2, M0) non-small cell lung cancer, adenocarcinoma presented with large right lower lobe lung mass and suspicious right lower paratracheal lymphadenopathy diagnosed in June 2022.  Molecular studies by [employer] showed no actionable mutations  PRIOR THERAPY:  1) Neoadjuvant systemic chemotherapy according to the Checkmate 816 with carboplatin for AUC of 5, Alimta 500 Mg/M2 and nivolumab 360 Mg IV every 3 weeks for 3 cycles.  Last dose 09/24/21.  Status post 3 cycles.  2) Concurrent chemoradiation with carboplatin for an AUC of 2 and paclitaxel 45 mg per metered square.  First dose expected on 11/04/2021.  Status post 7 cycles.   CURRENT THERAPY: Consolidation treatment with immunotherapy with Imfinzi 1500 Mg IV every 4 weeks.  First dose February 12, 2022.  Status post 6 cycles.  INTERVAL HISTORY: Paul [name] 76 y.o. male returns to the clinic today for follow-up visit.  The patient is feeling fine today with no concerning complaints except for mild cough.  He denied having any chest pain, shortness of breath or hemoptysis.  He has no nausea, vomiting, diarrhea or constipation.  He has no headache or visual changes.  He denied having any significant weight loss or night sweats.  He has been tolerating his treatment with Imfinzi fairly well except for the dry skin.  He had repeat CT scan of the chest performed recently and is here for evaluation and discussion of his discuss results.  MEDICAL HISTORY: Past Medical History:  Diagnosis Date   CHF (congestive heart failure) ([place])    Coronary artery disease    Diabetes mellitus without complication ([facility])    History of radiation therapy    Right lung- 11/27/21-01/10/22- Dr. [name]   Hypertension    Hypothyroidism    Ischemic  cardiomyopathy    Pneumonia    a long time ago   Sleep apnea    no longer uses a cpap    ALLERGIES:  has No Known Allergies.  MEDICATIONS:  Current Outpatient Medications  Medication Sig Dispense Refill   ammonium lactate (LAC-HYDRIN) 12 % lotion Apply 1 Application topically in the morning and at bedtime.     aspirin 81 MG chewable tablet Take 81 mg by mouth daily.     atorvastatin (LIPITOR) 40 MG tablet Take 40 mg by mouth daily.     benzonatate (TESSALON) 100 MG capsule Take 1 capsule (100 mg total) by mouth 3 (three) times daily as needed for cough. 30 capsule 2   blood glucose meter kit and supplies KIT Dispense based on patient and insurance preference. Use up to four times daily as directed. 1 each 0   carvedilol (COREG) 25 MG tablet Take 25 mg by mouth at bedtime.     Insulin Glargine-aglr 100 UNIT/ML SOPN Inject into the skin.     levothyroxine (SYNTHROID) 100 MCG tablet Take 1 tablet (100 mcg total) by mouth daily before breakfast. 30 tablet 2   lidocaine-prilocaine (EMLA) cream Apply 1 application topically as needed. 30 g 2   Menthol-Methyl Salicylate (MUSCLE RUB) 10-15 % CREA Apply 1 application topically daily as needed for muscle pain.     metFORMIN (GLUCOPHAGE) 500 MG tablet Take 1 tablet (500 mg total) by mouth 2 (two) times daily with a meal. 60 tablet 3   Multiple Vitamins-Minerals (MULTIVITAMIN WITH MINERALS) tablet Take  1 tablet by mouth daily.     nitroGLYCERIN (NITROSTAT) 0.4 MG SL tablet Place 0.4 mg under the tongue every 5 (five) minutes as needed for chest pain. (Patient not taking: Reported on 05/07/2022)     prochlorperazine (COMPAZINE) 10 MG tablet Take 1 tablet (10 mg total) by mouth every 6 (six) hours as needed for nausea or vomiting. (Patient not taking: Reported on 03/11/2022) 30 tablet 0   urea (CARMOL) 20 % cream Apply 1 Application topically 2 (two) times daily.     No current facility-administered medications for this visit.    SURGICAL HISTORY:   Past Surgical History:  Procedure Laterality Date   BRONCHIAL BIOPSY  06/04/2021   Procedure: BRONCHIAL BIOPSIES;  Surgeon: Garner Nash, DO;  Location: [facility];  Service: Pulmonary;;   BRONCHIAL BRUSHINGS  06/04/2021   Procedure: BRONCHIAL BRUSHINGS;  Surgeon: Garner Nash, DO;  Location: [facility];  Service: Pulmonary;;   BRONCHIAL NEEDLE ASPIRATION BIOPSY  06/04/2021   Procedure: BRONCHIAL NEEDLE ASPIRATION BIOPSIES;  Surgeon: Garner Nash, DO;  Location: [facility];  Service: Pulmonary;;   BRONCHIAL WASHINGS  06/04/2021   Procedure: BRONCHIAL WASHINGS;  Surgeon: Garner Nash, DO;  Location: [facility];  Service: Pulmonary;;   CARDIAC SURGERY     COLONOSCOPY     CORONARY ARTERY BYPASS GRAFT     2019 at [facility] Right 06/04/2021   Procedure: [employer];  Surgeon: Garner Nash, DO;  Location: [facility];  Service: Pulmonary;  Laterality: Right;   VIDEO BRONCHOSCOPY WITH ENDOBRONCHIAL ULTRASOUND  06/04/2021   Procedure: VIDEO BRONCHOSCOPY WITH ENDOBRONCHIAL ULTRASOUND;  Surgeon: Garner Nash, DO;  Location: [facility];  Service: Pulmonary;;    REVIEW OF SYSTEMS:  Constitutional: positive for fatigue Eyes: negative Ears, nose, mouth, throat, and face: negative Respiratory: positive for cough Cardiovascular: negative Gastrointestinal: negative Genitourinary:negative Integument/breast: positive for dryness Hematologic/lymphatic: negative Musculoskeletal:positive for arthralgias Neurological: positive for paresthesia Behavioral/Psych: negative Endocrine: negative Allergic/Immunologic: negative   PHYSICAL EXAMINATION: General appearance: alert, cooperative, fatigued, and no distress Head: Normocephalic, without obvious abnormality, atraumatic Neck: no adenopathy, no JVD, supple, symmetrical, trachea midline, and thyroid not enlarged, symmetric, no  tenderness/mass/nodules Lymph nodes: Cervical, supraclavicular, and axillary nodes normal. Resp: clear to auscultation bilaterally Back: symmetric, no curvature. ROM normal. No CVA tenderness. Cardio: regular rate and rhythm, S1, S2 normal, no murmur, click, rub or gallop GI: soft, non-tender; bowel sounds normal; no masses,  no organomegaly Extremities: extremities normal, atraumatic, no cyanosis or edema Neurologic: Alert and oriented X 3, normal strength and tone. Normal symmetric reflexes. Normal coordination and gait  ECOG PERFORMANCE STATUS: 1 - Symptomatic but completely ambulatory  Blood pressure 127/78, pulse 84, temperature 98 F (36.7 C), temperature source Oral, resp. rate 15, weight 231 lb 9.6 oz (105.1 kg), SpO2 98 %.  LABORATORY DATA: Lab Results  Component Value Date   WBC 7.1 09/24/2022   HGB 12.2 (L) 09/24/2022   HCT 37.6 (L) 09/24/2022   MCV 96.2 09/24/2022   PLT 275 09/24/2022      Chemistry      Component Value Date/Time   NA 141 09/24/2022 1056   K 3.8 09/24/2022 1056   CL 102 09/24/2022 1056   CO2 32 09/24/2022 1056   BUN 20 09/24/2022 1056   CREATININE 1.52 (H) 09/24/2022 1056      Component Value Date/Time   CALCIUM 9.3 09/24/2022 1056   ALKPHOS 102 09/24/2022 1056  AST 14 (L) 09/24/2022 1056   ALT 6 09/24/2022 1056   BILITOT 0.5 09/24/2022 1056       RADIOGRAPHIC STUDIES: CT Chest Wo Contrast  Result Date: 09/23/2022 CLINICAL DATA:  Non-small cell lung cancer staging. Prior radiation therapy and chemotherapy. Routine surveillance. * Tracking Code: BO * EXAM: CT CHEST WITHOUT CONTRAST TECHNIQUE: Multidetector CT imaging of the chest was performed following the standard protocol without IV contrast. RADIATION DOSE REDUCTION: This exam was performed according to the departmental dose-optimization program which includes automated exposure control, adjustment of the mA and/or kV according to patient size and/or use of iterative reconstruction  technique. COMPARISON:  05/06/2022 FINDINGS: Cardiovascular: Post CABG. Mediastinum/Nodes: No axillary or supraclavicular adenopathy. No mediastinal or hilar adenopathy. No pericardial fluid. Esophagus normal. Lungs/Pleura: Spiculated nodule of concern in the RIGHT lower lobe no longer entirely measurable in entirety. Lesion measures approximately 2.6 cm (image 89/5) similar prior. Lesion is now adjacent to increased atelectasis in the RIGHT lower lobe which partially obscures lesion. There is a moderate effusion on the RIGHT which is similar prior. There is RIGHT perihilar consolidation and bronchiectasis similar prior. Mild bronchiectasis and consolidation in the LEFT lower lobe stable Upper Abdomen: Limited view of the liver, kidneys, pancreas are unremarkable. Normal adrenal glands. Musculoskeletal: IMPRESSION: 1. Lesion of concern in the RIGHT lower lobe is less well-defined in part due to increased atelectasis in the RIGHT lower lobe, consider FDG PET scan for further characterization. 2. Perihilar consolidation and bronchiectasis in the RIGHT lung related to radiation change. 3. Stable RIGHT pleural effusion. 4. Mild atelectasis/consolidation bronchiectasis in LEFT lower lobe is a stable. Electronically Signed   By: Suzy Bouchard M.D.   On: 09/23/2022 13:20    ASSESSMENT AND PLAN: This is a very pleasant 76 years old African-American male diagnosed with a stage IIIB (T3b, N2, M0) non-small cell lung cancer, adenocarcinoma presented with large right lower lobe lung mass and suspicious right lower lobe paratracheal lymphadenopathy diagnosed in June 2022. Has MRI of the brain is still pending. Molecular studies by [employer] 360 showed no actionable mutations. The patient underwent neoadjuvant chemotherapy according to the Checkmate 816 with carboplatin for AUC of 5, Alimta 500 Mg/M2 and nivolumab 360 Mg IV every 3 weeks.  Status post 3 cycles. The patient continues to tolerate his treatment well with no  concerning adverse effects.  Repeat imaging studies after the neoadjuvant treatment showed stable disease and the patient was not a good candidate for surgical resection. He is currently undergoing a course of concurrent chemoradiation with weekly carboplatin for AUC of 2 and paclitaxel 45 Mg/M2 status post 7 cycles.  Last cycle was given January 02, 2022.   The patient tolerated the previous course of concurrent chemoradiation fairly well except for odynophagia and cough. His scan showed stable disease with no significant evidence of disease progression. The patient is currently on consolidation treatment with immunotherapy with Imfinzi 1500 Mg IV every 4 weeks status post 6 cycles.   The patient has been tolerating this treatment fairly well with no concerning adverse effect except for dry skin. He had repeat CT scan of the chest performed recently.  I personally and independently reviewed the scan and discussed the result with the patient today. His scan showed no concerning findings for disease progression. I recommended for the patient to continue his current treatment with Imfinzi and he will proceed with cycle #7 today. For the hypothyroidism, he will continue his current treatment with levothyroxine. For the history of diabetes  mellitus and hypertension he is followed by his primary care provider at the [facility] system. The patient will come back for follow-up visit in 4 weeks for evaluation before the next cycle of his treatment. He was advised to call immediately if he has any other concerning symptoms in the interval. The patient voices understanding of current disease status and treatment options and is in agreement with the current care plan.  All questions were answered. The patient knows to call the clinic with any problems, questions or concerns. We can certainly see the patient much sooner if necessary.  The total time spent in the appointment was 30 minutes.  Disclaimer: This note was  dictated with voice recognition software. Similar sounding words can inadvertently be transcribed and may not be corrected upon review.

## 2022-09-24 NOTE — Progress Notes (Signed)
Ok to treat with creat 1.52 mg/dL per [place], [place]

## 2022-09-24 NOTE — Telephone Encounter (Signed)
Pt did not present for his appts this morning. I called pt and he advised he forgot and thought his appt was another time. Pt has been advised we will work to see when we will be able to reschedule.  Pt spoke with scheduling on 08/29/22 and was advised of these appts. He was also provided a printed calendar and AVS with his future appts on them, as well as a reminder call 48hrs ahead of the appt day.  Given pt has missed multiple appts because he does not remember them, I have again reached out to his son and daughter listed on his records (I called in August as well). I am hopeful they will call back this time, so we can work to develop a plan that will better help him with his appts.  While on the phone with the pt a male joined the call and identified herself as his "close friend" and request we contact her for his appts. She and the pt were advised that he will need to sign a HIPAA release form and add her as his contact on record.

## 2022-09-24 NOTE — Telephone Encounter (Signed)
Infusion was able to fit pt in as a reschedule at 1:00pm. I have also asked Dr. Julien Nordmann if he is still able to see the pt today and he advised he can.  I have rescheduled his pts missed appts. Pt has been advised to arrive at 10:30am. He expressed understating of this information. He also understands he is being worked into the schedules, so he may experience a wait time.

## 2022-09-24 NOTE — Progress Notes (Signed)
Pt request IV team to start IV, states he only wants them to start the IV. IV team consult placed and contacted. Pt agrees to wait additional 30 min for IV team, charge RN aware.

## 2022-09-24 NOTE — Patient Instructions (Signed)
[facility] ONCOLOGY   Discharge Instructions: Thank you for choosing [facility] to provide your oncology and hematology care.   If you have a lab appointment with the [facility], please go directly to the [facility] and check in at the registration area.   Wear comfortable clothing and clothing appropriate for easy access to any Portacath or PICC line.   We strive to give you quality time with your provider. You may need to reschedule your appointment if you arrive late (15 or more minutes).  Arriving late affects you and other patients whose appointments are after yours.  Also, if you miss three or more appointments without notifying the office, you may be dismissed from the clinic at the provider's discretion.      For prescription refill requests, have your pharmacy contact our office and allow 72 hours for refills to be completed.    Today you received the following chemotherapy and/or immunotherapy agents: Durvalumab (Imfinzi)      To help prevent nausea and vomiting after your treatment, we encourage you to take your nausea medication as directed.  BELOW ARE SYMPTOMS THAT SHOULD BE REPORTED IMMEDIATELY: *FEVER GREATER THAN 100.4 F (38 C) OR HIGHER *CHILLS OR SWEATING *NAUSEA AND VOMITING THAT IS NOT CONTROLLED WITH YOUR NAUSEA MEDICATION *UNUSUAL SHORTNESS OF BREATH *UNUSUAL BRUISING OR BLEEDING *URINARY PROBLEMS (pain or burning when urinating, or frequent urination) *BOWEL PROBLEMS (unusual diarrhea, constipation, pain near the anus) TENDERNESS IN MOUTH AND THROAT WITH OR WITHOUT PRESENCE OF ULCERS (sore throat, sores in mouth, or a toothache) UNUSUAL RASH, SWELLING OR PAIN  UNUSUAL VAGINAL DISCHARGE OR ITCHING   Items with * indicate a potential emergency and should be followed up as soon as possible or go to the Emergency Department if any problems should occur.  Please show the CHEMOTHERAPY ALERT CARD or IMMUNOTHERAPY ALERT CARD at  check-in to the Emergency Department and triage nurse.  Should you have questions after your visit or need to cancel or reschedule your appointment, please contact [facility]  Dept: [phone]  and follow the prompts.  Office hours are 8:00 a.m. to 4:30 p.m. Monday - Friday. Please note that voicemails left after 4:00 p.m. may not be returned until the following business day.  We are closed weekends and major holidays. You have access to a nurse at all times for urgent questions. Please call the main number to the clinic Dept: [phone] and follow the prompts.   For any non-urgent questions, you may also contact your provider using MyChart. We now offer e-Visits for anyone 51 and older to request care online for non-urgent symptoms. For details visit [email or web address].   Also download the MyChart app! Go to the app store, search "MyChart", open the app, select [facility], and log in with your MyChart username and password.  Masks are optional in the cancer centers. If you would like for your care team to wear a mask while they are taking care of you, please let them know. You may have one support person who is at least 78 years old accompany you for your appointments.

## 2022-09-28 ENCOUNTER — Other Ambulatory Visit: Payer: Self-pay

## 2022-10-02 ENCOUNTER — Other Ambulatory Visit: Payer: Self-pay

## 2022-10-03 ENCOUNTER — Other Ambulatory Visit: Payer: Self-pay

## 2022-10-15 ENCOUNTER — Telehealth: Payer: Self-pay | Admitting: Internal Medicine

## 2022-10-15 NOTE — Telephone Encounter (Signed)
Called patient regarding October, November and December appointments. Left a voicemail.

## 2022-10-16 ENCOUNTER — Other Ambulatory Visit: Payer: Self-pay

## 2022-10-20 NOTE — Progress Notes (Unsigned)
[place] [address]  DIAGNOSIS: Stage IIIB (T3b, N2, M0) non-small cell lung cancer, adenocarcinoma presented with large right lower lobe lung mass and suspicious right lower paratracheal lymphadenopathy diagnosed in June 2022.   Molecular studies by [employer] 360 showed no actionable mutations  PRIOR THERAPY: 1) Neoadjuvant systemic chemotherapy according to the Checkmate 816 with carboplatin for AUC of 5, Alimta 500 Mg/M2 and nivolumab 360 Mg IV every 3 weeks for 3 cycles.  Last dose 09/24/21.  Status post 3 cycles 2) 2) Concurrent chemoradiation with carboplatin for an AUC of 2 and paclitaxel 45 mg per metered square.  First dose expected on 11/04/2021.  Status post 7 cycles.  CURRENT THERAPY: Consolidation treatment with immunotherapy with Imfinzi 1500 Mg IV every 4 weeks.  First dose February 12, 2022.  Status post 7 cycles.  INTERVAL HISTORY: Paul [name] 76 y.o. male returns  to the clinic today for a follow-up visit.  The patient is feeling fairly well today, states he is doing "okay" and about the "same".     The patient is currently undergoing consolidation immunotherapy with Imfinzi.  He is status post 7 cycles and is tolerated well without any concerning adverse side effects.  We are monitoring his thyroid closely and adjusting Synthroid if needed.  His Synthroid was adjusted a few weeks ago.    Today he denies any fever, chills, night sweats, or new unexplained weight loss.  He denies any chest pain or hemoptysis.  He reports continued shortness of breath that began since receiving infusions, which he started in Feburary 2023, he reports this is about the same. Of note, his most recent CT scan from 1 month ago noted stable right pleural effusion and post radiation changes in his lungs. He saw his PCP last week. He does not take any rescue inhalers. He states he will sometimes  have to stop after taking just a few steps. He reports continued cough that began when infusions began. He reports phlegm production, off-white in color and worse productions at night. He states this has been going on for about 5 months. He additionally reports a a rash on his body that began with infusion and neuropathy-like symptoms in extremities. In particular cold sensation in his fingers and pin-and-needles sensation in hands and feet. He endorses trouble balancing when walking. He has diabetes. He denies any nausea, vomiting, constipation, or diarrhea.  No headaches or vision changes.    He is here today for evaluation and repeat blood work before starting cycle #8.     MEDICAL HISTORY: Past Medical History:  Diagnosis Date   CHF (congestive heart failure) ([place])    Coronary artery disease    Diabetes mellitus without complication ([facility])    History of radiation therapy    Right lung- 11/27/21-01/10/22- Dr. [name]   Hypertension    Hypothyroidism    Ischemic cardiomyopathy    Pneumonia    a long time ago   Sleep apnea    no longer uses a cpap    ALLERGIES:  has No Known Allergies.  MEDICATIONS:  Current Outpatient Medications  Medication Sig Dispense Refill   ammonium lactate (LAC-HYDRIN) 12 % lotion Apply 1 Application topically in the morning and at bedtime.     aspirin 81 MG chewable tablet Take 81 mg by mouth daily.     atorvastatin (LIPITOR) 40 MG tablet Take 40 mg by mouth daily.  blood glucose meter kit and supplies KIT Dispense based on patient and insurance preference. Use up to four times daily as directed. 1 each 0   carvedilol (COREG) 25 MG tablet Take 25 mg by mouth at bedtime.     Insulin Glargine-aglr 100 UNIT/ML SOPN Inject into the skin.     levothyroxine (SYNTHROID) 100 MCG tablet Take 1 tablet (100 mcg total) by mouth daily before breakfast. 30 tablet 2   lidocaine-prilocaine (EMLA) cream Apply 1 application topically as needed. 30 g 2    Menthol-Methyl Salicylate (MUSCLE RUB) 10-15 % CREA Apply 1 application topically daily as needed for muscle pain.     Multiple Vitamins-Minerals (MULTIVITAMIN WITH MINERALS) tablet Take 1 tablet by mouth daily.     nitroGLYCERIN (NITROSTAT) 0.4 MG SL tablet Place 0.4 mg under the tongue every 5 (five) minutes as needed for chest pain.     prochlorperazine (COMPAZINE) 10 MG tablet Take 1 tablet (10 mg total) by mouth every 6 (six) hours as needed for nausea or vomiting. 30 tablet 0   urea (CARMOL) 20 % cream Apply 1 Application topically 2 (two) times daily.     benzonatate (TESSALON) 100 MG capsule Take 1 capsule (100 mg total) by mouth 3 (three) times daily as needed for cough. 30 capsule 2   metFORMIN (GLUCOPHAGE) 500 MG tablet Take 1 tablet (500 mg total) by mouth 2 (two) times daily with a meal. 60 tablet 3   No current facility-administered medications for this visit.    SURGICAL HISTORY:  Past Surgical History:  Procedure Laterality Date   BRONCHIAL BIOPSY  06/04/2021   Procedure: BRONCHIAL BIOPSIES;  Surgeon: Garner Nash, DO;  Location: [facility];  Service: Pulmonary;;   BRONCHIAL BRUSHINGS  06/04/2021   Procedure: BRONCHIAL BRUSHINGS;  Surgeon: Garner Nash, DO;  Location: [facility];  Service: Pulmonary;;   BRONCHIAL NEEDLE ASPIRATION BIOPSY  06/04/2021   Procedure: BRONCHIAL NEEDLE ASPIRATION BIOPSIES;  Surgeon: Garner Nash, DO;  Location: [facility];  Service: Pulmonary;;   BRONCHIAL WASHINGS  06/04/2021   Procedure: BRONCHIAL WASHINGS;  Surgeon: Garner Nash, DO;  Location: [facility];  Service: Pulmonary;;   CARDIAC SURGERY     COLONOSCOPY     CORONARY ARTERY BYPASS GRAFT     2019 at [facility] Right 06/04/2021   Procedure: [place];  Surgeon: Garner Nash, DO;  Location: [facility];  Service: Pulmonary;  Laterality: Right;   VIDEO BRONCHOSCOPY WITH  ENDOBRONCHIAL ULTRASOUND  06/04/2021   Procedure: VIDEO BRONCHOSCOPY WITH ENDOBRONCHIAL ULTRASOUND;  Surgeon: Garner Nash, DO;  Location: [facility];  Service: Pulmonary;;    REVIEW OF SYSTEMS:   Review of Systems  Constitutional: Negative for appetite change, chills, fatigue, fever and unexpected weight change.  HENT:   Negative for mouth sores, nosebleeds, sore throat and trouble swallowing.   Eyes: Negative for eye problems and icterus.  Respiratory: Negative for hemoptysis, and wheezing.  Shortness of breath with exertion and cough  Cardiovascular: Negative for chest pain and leg swelling.  Gastrointestinal: Negative for abdominal pain, constipation, diarrhea, nausea and vomiting.  Genitourinary: Negative for bladder incontinence, difficulty urinating, dysuria, frequency and hematuria.   Musculoskeletal: Negative for back pain, gait problem, neck pain and neck stiffness.  Skin: Negative for itching. Dry skin Neurological: Negative for dizziness, extremity weakness, gait problem, headaches, light-headedness and seizures. Pins and needles sensation, cold sensation, and issue balancing when walking  Hematological: Negative  for adenopathy. Does not bruise/bleed easily.  Psychiatric/Behavioral: Negative for confusion, depression and sleep disturbance. The patient is not nervous/anxious.     PHYSICAL EXAMINATION:  Blood pressure (!) 101/59, pulse 92, temperature 97.7 F (36.5 C), temperature source Tympanic, weight 236 lb 12.8 oz (107.4 kg), SpO2 100 %.  ECOG PERFORMANCE STATUS: 1  Physical Exam  Constitutional: Oriented to person, place, and time and well-developed, well-nourished, and in no distress.  HENT:  Head: Normocephalic and atraumatic.  Mouth/Throat: Oropharynx is clear and moist. No oropharyngeal exudate.  Eyes: Conjunctivae are normal. Right eye exhibits no discharge. Left eye exhibits no discharge. No scleral icterus.  Neck: Normal range of motion. Neck supple.   Cardiovascular: Normal rate, regular rhythm, normal heart sounds and intact distal pulses.   Pulmonary/Chest: Effort normal and breath sounds normal. No respiratory distress. No wheezes. No rales.  Abdominal: Soft. Bowel sounds are normal. Exhibits no distension and no mass. There is no tenderness.  Musculoskeletal: Normal range of motion. Exhibits no edema.  Lymphadenopathy:    No cervical adenopathy.  Neurological: Alert and oriented to person, place, and time. Exhibits normal muscle tone. Gait normal. Coordination normal.  Skin: Skin is warm and dry. Dry skin on lower extremities. Not diaphoretic. No erythema. No pallor.  Psychiatric: Mood, memory and judgment normal.  Vitals reviewed.  LABORATORY DATA: Lab Results  Component Value Date   WBC 7.6 10/22/2022   HGB 11.3 (L) 10/22/2022   HCT 34.6 (L) 10/22/2022   MCV 96.9 10/22/2022   PLT 242 10/22/2022      Chemistry      Component Value Date/Time   NA 142 10/22/2022 1012   K 3.9 10/22/2022 1012   CL 104 10/22/2022 1012   CO2 29 10/22/2022 1012   BUN 30 (H) 10/22/2022 1012   CREATININE 1.77 (H) 10/22/2022 1012      Component Value Date/Time   CALCIUM 9.6 10/22/2022 1012   ALKPHOS 103 10/22/2022 1012   AST 10 (L) 10/22/2022 1012   ALT 5 10/22/2022 1012   BILITOT 0.4 10/22/2022 1012       RADIOGRAPHIC STUDIES:  CT Chest Wo Contrast  Result Date: 09/23/2022 CLINICAL DATA:  Non-small cell lung cancer staging. Prior radiation therapy and chemotherapy. Routine surveillance. * Tracking Code: BO * EXAM: CT CHEST WITHOUT CONTRAST TECHNIQUE: Multidetector CT imaging of the chest was performed following the standard protocol without IV contrast. RADIATION DOSE REDUCTION: This exam was performed according to the departmental dose-optimization program which includes automated exposure control, adjustment of the mA and/or kV according to patient size and/or use of iterative reconstruction technique. COMPARISON:  05/06/2022 FINDINGS:  Cardiovascular: Post CABG. Mediastinum/Nodes: No axillary or supraclavicular adenopathy. No mediastinal or hilar adenopathy. No pericardial fluid. Esophagus normal. Lungs/Pleura: Spiculated nodule of concern in the RIGHT lower lobe no longer entirely measurable in entirety. Lesion measures approximately 2.6 cm (image 89/5) similar prior. Lesion is now adjacent to increased atelectasis in the RIGHT lower lobe which partially obscures lesion. There is a moderate effusion on the RIGHT which is similar prior. There is RIGHT perihilar consolidation and bronchiectasis similar prior. Mild bronchiectasis and consolidation in the LEFT lower lobe stable Upper Abdomen: Limited view of the liver, kidneys, pancreas are unremarkable. Normal adrenal glands. Musculoskeletal: IMPRESSION: 1. Lesion of concern in the RIGHT lower lobe is less well-defined in part due to increased atelectasis in the RIGHT lower lobe, consider FDG PET scan for further characterization. 2. Perihilar consolidation and bronchiectasis in the RIGHT lung related to radiation  change. 3. Stable RIGHT pleural effusion. 4. Mild atelectasis/consolidation bronchiectasis in LEFT lower lobe is a stable. Electronically Signed   By: Suzy Bouchard M.D.   On: 09/23/2022 13:20     ASSESSMENT/PLAN:  This is a very pleasant 76 year old African-American male diagnosed with stage IIIb (T3b, N2, M0) non-small cell lung cancer, adenocarcinoma.  He presented with a large right upper lobe lung mass and suspicious right lower lobe paratracheal lymphadenopathy.  He was diagnosed in June 2022.  His molecular studies by guardant 360 were negative for any actionable mutations.   The patient completed 3 cycles of neoadjuvant chemotherapy according to the Checkmate 816 trial with carboplatin for an AUC of 5, Alimta 500 mg per metered square, and nivolumab 360 mg IV every 3 weeks.  The patient status post 3 cycles.  His last dose was on 09/24/2021.  Upon reimaging after completion  of this course, there was not significant improvement in the size of his mass and therefore, it does not appear that we are able to downstage his disease to make him eligible for surgical resection.   He completed a course of concurrent chemoradiation with weekly carboplatin for AUC of 2 and paclitaxel 45 Mg/M2 status post 7 cycles.  Last cycle was given January 02, 2022.     The patient is currently on consolidation treatment with immunotherapy with Imfinzi 1500 Mg IV every 4 weeks status post 7 cycles.  He tolerated this fairly well.      Labs were reviewed. He has similar CKD with creatinine of 1.77 today.  Recommend that he proceed cycle #8 today scheduled.   We will see him back for follow-up visit in 4 weeks for evaluation before starting cycle #9   We will continue to monitor his TSH closely and make dose adjustments of his Synthroid if needed.  His TSH from today is pending at this time.  I re-reviewed the patient's most recent CT with him. He does have a stable moderate right pleural effusion, atelectasis, and post radiation perihilar consolidation and bronchiectasis. The patient states his symptoms started when his imfinzi, but I am willing to bet it moreso is related to his post treatment changes from his radiation and the effusion, which explains his symptoms at night when he lays down. I reviewed we can certainly try a thoracentesis for the effusion. His oxygen is 100%. He is not interested in this at this time. In the meantime, I asked him to talk to his PCP to see if he needs any inhalers or COPD management.   His BP was rechecked in the infusion room today and was 114/74. Advised to monitor his BP at home. He believes he takes another antihypertensive not on his medication list. He will call us later and let us know what that is.   For his dry skin, advised to use lotion.    The patient was advised to call immediately if he has any concerning symptoms in the interval. The patient  voices understanding of current disease status and treatment options and is in agreement with the current care plan. All questions were answered. The patient knows to call the clinic with any problems, questions or concerns. We can certainly see the patient much sooner if necessary      No orders of the defined types were placed in this encounter.    The total time spent in the appointment was 20-29 minutes.  Kailee Essman L Jerame Hedding, PA-C 10/22/22

## 2022-10-22 ENCOUNTER — Inpatient Hospital Stay: Payer: No Typology Code available for payment source

## 2022-10-22 ENCOUNTER — Other Ambulatory Visit: Payer: Self-pay

## 2022-10-22 ENCOUNTER — Inpatient Hospital Stay: Payer: No Typology Code available for payment source | Attending: Internal Medicine

## 2022-10-22 ENCOUNTER — Inpatient Hospital Stay (HOSPITAL_BASED_OUTPATIENT_CLINIC_OR_DEPARTMENT_OTHER): Payer: No Typology Code available for payment source | Admitting: Physician Assistant

## 2022-10-22 VITALS — BP 101/59 | HR 92 | Temp 97.7°F | Wt 236.8 lb

## 2022-10-22 VITALS — BP 114/74

## 2022-10-22 DIAGNOSIS — I509 Heart failure, unspecified: Secondary | ICD-10-CM | POA: Insufficient documentation

## 2022-10-22 DIAGNOSIS — Z923 Personal history of irradiation: Secondary | ICD-10-CM | POA: Diagnosis not present

## 2022-10-22 DIAGNOSIS — J479 Bronchiectasis, uncomplicated: Secondary | ICD-10-CM | POA: Insufficient documentation

## 2022-10-22 DIAGNOSIS — Z5112 Encounter for antineoplastic immunotherapy: Secondary | ICD-10-CM | POA: Insufficient documentation

## 2022-10-22 DIAGNOSIS — N189 Chronic kidney disease, unspecified: Secondary | ICD-10-CM | POA: Diagnosis not present

## 2022-10-22 DIAGNOSIS — E1122 Type 2 diabetes mellitus with diabetic chronic kidney disease: Secondary | ICD-10-CM | POA: Insufficient documentation

## 2022-10-22 DIAGNOSIS — C3431 Malignant neoplasm of lower lobe, right bronchus or lung: Secondary | ICD-10-CM | POA: Insufficient documentation

## 2022-10-22 DIAGNOSIS — I130 Hypertensive heart and chronic kidney disease with heart failure and stage 1 through stage 4 chronic kidney disease, or unspecified chronic kidney disease: Secondary | ICD-10-CM | POA: Diagnosis not present

## 2022-10-22 DIAGNOSIS — C3491 Malignant neoplasm of unspecified part of right bronchus or lung: Secondary | ICD-10-CM

## 2022-10-22 DIAGNOSIS — J90 Pleural effusion, not elsewhere classified: Secondary | ICD-10-CM | POA: Diagnosis not present

## 2022-10-22 LAB — CMP (CANCER CENTER ONLY)
ALT: 5 U/L (ref 0–44)
AST: 10 U/L — ABNORMAL LOW (ref 15–41)
Albumin: 3.9 g/dL (ref 3.5–5.0)
Alkaline Phosphatase: 103 U/L (ref 38–126)
Anion gap: 9 (ref 5–15)
BUN: 30 mg/dL — ABNORMAL HIGH (ref 8–23)
CO2: 29 mmol/L (ref 22–32)
Calcium: 9.6 mg/dL (ref 8.9–10.3)
Chloride: 104 mmol/L (ref 98–111)
Creatinine: 1.77 mg/dL — ABNORMAL HIGH (ref 0.61–1.24)
GFR, Estimated: 39 mL/min — ABNORMAL LOW (ref >60)
Glucose, Bld: 192 mg/dL — ABNORMAL HIGH (ref 70–99)
Potassium: 3.9 mmol/L (ref 3.5–5.1)
Sodium: 142 mmol/L (ref 135–145)
Total Bilirubin: 0.4 mg/dL (ref 0.3–1.2)
Total Protein: 7.3 g/dL (ref 6.5–8.1)

## 2022-10-22 LAB — CBC WITH DIFFERENTIAL (CANCER CENTER ONLY)
Abs Immature Granulocytes: 0.02 10*3/uL (ref 0.00–0.07)
Basophils Absolute: 0.1 10*3/uL (ref 0.0–0.1)
Basophils Relative: 1 %
Eosinophils Absolute: 0.2 10*3/uL (ref 0.0–0.5)
Eosinophils Relative: 3 %
HCT: 34.6 % — ABNORMAL LOW (ref 39.0–52.0)
Hemoglobin: 11.3 g/dL — ABNORMAL LOW (ref 13.0–17.0)
Immature Granulocytes: 0 %
Lymphocytes Relative: 18 %
Lymphs Abs: 1.4 10*3/uL (ref 0.7–4.0)
MCH: 31.7 pg (ref 26.0–34.0)
MCHC: 32.7 g/dL (ref 30.0–36.0)
MCV: 96.9 fL (ref 80.0–100.0)
Monocytes Absolute: 0.8 10*3/uL (ref 0.1–1.0)
Monocytes Relative: 11 %
Neutro Abs: 5 10*3/uL (ref 1.7–7.7)
Neutrophils Relative %: 67 %
Platelet Count: 242 10*3/uL (ref 150–400)
RBC: 3.57 MIL/uL — ABNORMAL LOW (ref 4.22–5.81)
RDW: 14.1 % (ref 11.5–15.5)
WBC Count: 7.6 10*3/uL (ref 4.0–10.5)
nRBC: 0 % (ref 0.0–0.2)

## 2022-10-22 LAB — TSH: TSH: 10.268 u[IU]/mL — ABNORMAL HIGH (ref 0.350–4.500)

## 2022-10-22 MED ORDER — SODIUM CHLORIDE 0.9 % IV SOLN
1500.0000 mg | Freq: Once | INTRAVENOUS | Status: AC
  Administered 2022-10-22: 1500 mg via INTRAVENOUS
  Filled 2022-10-22: qty 30

## 2022-10-22 MED ORDER — SODIUM CHLORIDE 0.9 % IV SOLN: Freq: Once | INTRAVENOUS | Status: AC

## 2022-10-22 MED ORDER — BENZONATATE 100 MG PO CAPS: 100.0000 mg | ORAL_CAPSULE | Freq: Three times a day (TID) | ORAL | 2 refills | Status: DC | PRN

## 2022-10-22 NOTE — Patient Instructions (Signed)
[facility]   Discharge Instructions: Thank you for choosing [facility] to provide your oncology and hematology care.   If you have a lab appointment with the [facility], please go directly to the [facility] and check in at the registration area.   Wear comfortable clothing and clothing appropriate for easy access to any Portacath or PICC line.   We strive to give you quality time with your provider. You may need to reschedule your appointment if you arrive late (15 or more minutes).  Arriving late affects you and other patients whose appointments are after yours.  Also, if you miss three or more appointments without notifying the office, you may be dismissed from the clinic at the provider's discretion.      For prescription refill requests, have your pharmacy contact our office and allow 72 hours for refills to be completed.    Today you received the following chemotherapy and/or immunotherapy agents: Durvalumab (Imfinzi)      To help prevent nausea and vomiting after your treatment, we encourage you to take your nausea medication as directed.  BELOW ARE SYMPTOMS THAT SHOULD BE REPORTED IMMEDIATELY: *FEVER GREATER THAN 100.4 F (38 C) OR HIGHER *CHILLS OR SWEATING *NAUSEA AND VOMITING THAT IS NOT CONTROLLED WITH YOUR NAUSEA MEDICATION *UNUSUAL SHORTNESS OF BREATH *UNUSUAL BRUISING OR BLEEDING *URINARY PROBLEMS (pain or burning when urinating, or frequent urination) *BOWEL PROBLEMS (unusual diarrhea, constipation, pain near the anus) TENDERNESS IN MOUTH AND THROAT WITH OR WITHOUT PRESENCE OF ULCERS (sore throat, sores in mouth, or a toothache) UNUSUAL RASH, SWELLING OR PAIN  UNUSUAL VAGINAL DISCHARGE OR ITCHING   Items with * indicate a potential emergency and should be followed up as soon as possible or go to the Emergency Department if any problems should occur.  Please show the CHEMOTHERAPY ALERT CARD or IMMUNOTHERAPY ALERT CARD at  check-in to the Emergency Department and triage nurse.  Should you have questions after your visit or need to cancel or reschedule your appointment, please contact [facility]  Dept: [phone]  and follow the prompts.  Office hours are 8:00 a.m. to 4:30 p.m. Monday - Friday. Please note that voicemails left after 4:00 p.m. may not be returned until the following business day.  We are closed weekends and major holidays. You have access to a nurse at all times for urgent questions. Please call the main number to the clinic Dept: [phone] and follow the prompts.   For any non-urgent questions, you may also contact your provider using MyChart. We now offer e-Visits for anyone 29 and older to request care online for non-urgent symptoms. For details visit [email or web address].   Also download the MyChart app! Go to the app store, search "MyChart", open the app, select [facility], and log in with your MyChart username and password.  Masks are optional in the cancer centers. If you would like for your care team to wear a mask while they are taking care of you, please let them know. You may have one support person who is at least 78 years old accompany you for your appointments.

## 2022-10-22 NOTE — Progress Notes (Signed)
Ok to treat with D1/C8 Imfinzi with elevated creatinine per [name] PA

## 2022-10-23 ENCOUNTER — Other Ambulatory Visit: Payer: Self-pay

## 2022-11-04 ENCOUNTER — Other Ambulatory Visit: Payer: Self-pay

## 2022-11-13 NOTE — Progress Notes (Deleted)
[place] [address]  DIAGNOSIS: Stage IIIB (T3b, N2, M0) non-small cell lung cancer, adenocarcinoma presented with large right lower lobe lung mass and suspicious right lower paratracheal lymphadenopathy diagnosed in June 2022.   Molecular studies by [employer] showed no actionable mutations  PRIOR THERAPY: 1) Neoadjuvant systemic chemotherapy according to the Checkmate 816 with carboplatin for AUC of 5, Alimta 500 Mg/M2 and nivolumab 360 Mg IV every 3 weeks for 3 cycles.  Last dose 09/24/21.  Status post 3 cycles 2) 2) Concurrent chemoradiation with carboplatin for an AUC of 2 and paclitaxel 45 mg per metered square.  First dose expected on 11/04/2021.  Status post 7 cycles.  CURRENT THERAPY: Consolidation treatment with immunotherapy with Imfinzi 1500 Mg IV every 4 weeks.  First dose February 12, 2022.  Status post 8 cycles.   INTERVAL HISTORY: Paul [name] 76 y.o. male returns to the clinic today for a follow-up visit.  The patient is feeling fairly well today, states he is doing "***" and about the "***".      Today he denies any fever, chills, night sweats, or new unexplained weight loss.  He denies any chest pain or hemoptysis.  He reports continued shortness of breath that began since receiving infusions, which he started in Feburary 2023, he reports this is about the same.   Of note, his most recent CT scan from 2 month ago noted stable right pleural effusion and post radiation changes in his lungs.   He does not take any rescue inhalers. He states he will sometimes have to stop after taking just a few steps. He reports continued cough that began when infusions began. He reports phlegm production, off-white in color and worse productions at night.     He states this has been going on for about 6 months. He additionally reports a a rash on his body that began with infusion  and neuropathy-like symptoms in extremities. In particular cold sensation in his fingers and pin-and-needles sensation in hands and feet. He endorses trouble balancing when walking. He has diabetes. He denies any nausea, vomiting, constipation, or diarrhea.  No headaches or vision changes.     He is here today for evaluation and repeat blood work before starting cycle #9         MEDICAL HISTORY: Past Medical History:  Diagnosis Date   CHF (congestive heart failure) ([place])    Coronary artery disease    Diabetes mellitus without complication ([place])    History of radiation therapy    Right lung- 11/27/21-01/10/22- Dr. [name]   Hypertension    Hypothyroidism    Ischemic cardiomyopathy    Pneumonia    a long time ago   Sleep apnea    no longer uses a cpap    ALLERGIES:  has No Known Allergies.  MEDICATIONS:  Current Outpatient Medications  Medication Sig Dispense Refill   ammonium lactate (LAC-HYDRIN) 12 % lotion Apply 1 Application topically in the morning and at bedtime.     aspirin 81 MG chewable tablet Take 81 mg by mouth daily.     atorvastatin (LIPITOR) 40 MG tablet Take 40 mg by mouth daily.     benzonatate (TESSALON) 100 MG capsule Take 1 capsule (100 mg total) by mouth 3 (three) times daily as needed for cough. 30 capsule 2   blood glucose meter kit and supplies KIT Dispense based on patient and insurance preference. Use  up to four times daily as directed. 1 each 0   carvedilol (COREG) 25 MG tablet Take 25 mg by mouth at bedtime.     Insulin Glargine-aglr 100 UNIT/ML SOPN Inject into the skin.     levothyroxine (SYNTHROID) 100 MCG tablet Take 1 tablet (100 mcg total) by mouth daily before breakfast. 30 tablet 2   lidocaine-prilocaine (EMLA) cream Apply 1 application topically as needed. 30 g 2   Menthol-Methyl Salicylate (MUSCLE RUB) 10-15 % CREA Apply 1 application topically daily as needed for muscle pain.     metFORMIN (GLUCOPHAGE) 500 MG tablet Take 1 tablet  (500 mg total) by mouth 2 (two) times daily with a meal. 60 tablet 3   Multiple Vitamins-Minerals (MULTIVITAMIN WITH MINERALS) tablet Take 1 tablet by mouth daily.     nitroGLYCERIN (NITROSTAT) 0.4 MG SL tablet Place 0.4 mg under the tongue every 5 (five) minutes as needed for chest pain.     prochlorperazine (COMPAZINE) 10 MG tablet Take 1 tablet (10 mg total) by mouth every 6 (six) hours as needed for nausea or vomiting. 30 tablet 0   urea (CARMOL) 20 % cream Apply 1 Application topically 2 (two) times daily.     No current facility-administered medications for this visit.    SURGICAL HISTORY:  Past Surgical History:  Procedure Laterality Date   BRONCHIAL BIOPSY  06/04/2021   Procedure: BRONCHIAL BIOPSIES;  Surgeon: Garner Nash, DO;  Location: [facility];  Service: Pulmonary;;   BRONCHIAL BRUSHINGS  06/04/2021   Procedure: BRONCHIAL BRUSHINGS;  Surgeon: Garner Nash, DO;  Location: [facility];  Service: Pulmonary;;   BRONCHIAL NEEDLE ASPIRATION BIOPSY  06/04/2021   Procedure: BRONCHIAL NEEDLE ASPIRATION BIOPSIES;  Surgeon: Garner Nash, DO;  Location: [facility];  Service: Pulmonary;;   BRONCHIAL WASHINGS  06/04/2021   Procedure: BRONCHIAL WASHINGS;  Surgeon: Garner Nash, DO;  Location: [facility];  Service: Pulmonary;;   CARDIAC SURGERY     COLONOSCOPY     CORONARY ARTERY BYPASS GRAFT     2019 at [place] Right 06/04/2021   Procedure: [place];  Surgeon: Garner Nash, DO;  Location: [facility];  Service: Pulmonary;  Laterality: Right;   VIDEO BRONCHOSCOPY WITH ENDOBRONCHIAL ULTRASOUND  06/04/2021   Procedure: VIDEO BRONCHOSCOPY WITH ENDOBRONCHIAL ULTRASOUND;  Surgeon: Garner Nash, DO;  Location: [facility];  Service: Pulmonary;;    REVIEW OF SYSTEMS:   Review of Systems  Constitutional: Negative for appetite change, chills, fatigue, fever and unexpected weight  change.  HENT:   Negative for mouth sores, nosebleeds, sore throat and trouble swallowing.   Eyes: Negative for eye problems and icterus.  Respiratory: Negative for cough, hemoptysis, shortness of breath and wheezing.   Cardiovascular: Negative for chest pain and leg swelling.  Gastrointestinal: Negative for abdominal pain, constipation, diarrhea, nausea and vomiting.  Genitourinary: Negative for bladder incontinence, difficulty urinating, dysuria, frequency and hematuria.   Musculoskeletal: Negative for back pain, gait problem, neck pain and neck stiffness.  Skin: Negative for itching and rash.  Neurological: Negative for dizziness, extremity weakness, gait problem, headaches, light-headedness and seizures.  Hematological: Negative for adenopathy. Does not bruise/bleed easily.  Psychiatric/Behavioral: Negative for confusion, depression and sleep disturbance. The patient is not nervous/anxious.     PHYSICAL EXAMINATION:  There were no vitals taken for this visit.  ECOG PERFORMANCE STATUS: {CHL ONC ECOG [id number]  Physical Exam  Constitutional: Oriented to person, place, and time  and well-developed, well-nourished, and in no distress. No distress.  HENT:  Head: Normocephalic and atraumatic.  Mouth/Throat: Oropharynx is clear and moist. No oropharyngeal exudate.  Eyes: Conjunctivae are normal. Right eye exhibits no discharge. Left eye exhibits no discharge. No scleral icterus.  Neck: Normal range of motion. Neck supple.  Cardiovascular: Normal rate, regular rhythm, normal heart sounds and intact distal pulses.   Pulmonary/Chest: Effort normal and breath sounds normal. No respiratory distress. No wheezes. No rales.  Abdominal: Soft. Bowel sounds are normal. Exhibits no distension and no mass. There is no tenderness.  Musculoskeletal: Normal range of motion. Exhibits no edema.  Lymphadenopathy:    No cervical adenopathy.  Neurological: Alert and oriented to person, place, and time.  Exhibits normal muscle tone. Gait normal. Coordination normal.  Skin: Skin is warm and dry. No rash noted. Not diaphoretic. No erythema. No pallor.  Psychiatric: Mood, memory and judgment normal.  Vitals reviewed.  LABORATORY DATA: Lab Results  Component Value Date   WBC 7.6 10/22/2022   HGB 11.3 (L) 10/22/2022   HCT 34.6 (L) 10/22/2022   MCV 96.9 10/22/2022   PLT 242 10/22/2022      Chemistry      Component Value Date/Time   NA 142 10/22/2022 1012   K 3.9 10/22/2022 1012   CL 104 10/22/2022 1012   CO2 29 10/22/2022 1012   BUN 30 (H) 10/22/2022 1012   CREATININE 1.77 (H) 10/22/2022 1012      Component Value Date/Time   CALCIUM 9.6 10/22/2022 1012   ALKPHOS 103 10/22/2022 1012   AST 10 (L) 10/22/2022 1012   ALT 5 10/22/2022 1012   BILITOT 0.4 10/22/2022 1012       RADIOGRAPHIC STUDIES:  No results found.   ASSESSMENT/PLAN:  This is a very pleasant 76 year old African-American male diagnosed with stage IIIb (T3b, N2, M0) non-small cell lung cancer, adenocarcinoma.  He presented with a large right upper lobe lung mass and suspicious right lower lobe paratracheal lymphadenopathy.  He was diagnosed in June 2022.  His molecular studies by [employer] were negative for any actionable mutations.   The patient completed 3 cycles of neoadjuvant chemotherapy according to the Checkmate 816 trial with carboplatin for an AUC of 5, Alimta 500 mg per metered square, and nivolumab 360 mg IV every 3 weeks.  The patient status post 3 cycles.  His last dose was on 09/24/2021.  Upon reimaging after completion of this course, there was not significant improvement in the size of his mass and therefore, it does not appear that we are able to downstage his disease to make him eligible for surgical resection.   He completed a course of concurrent chemoradiation with weekly carboplatin for AUC of 2 and paclitaxel 45 Mg/M2 status post 7 cycles.  Last cycle was given January 02, 2022.     The  patient is currently on consolidation treatment with immunotherapy with Imfinzi 1500 Mg IV every 4 weeks status post 8 cycles.  He tolerated this fairly well.     Labs were reviewed. He has similar CKD with creatinine of *** today.  Recommend that he proceed cycle #8 today scheduled.   We will see him back for follow-up visit in 4 weeks for evaluation before starting cycle #10.  I will arrange for a restaging CT of the chest prior to his next appointment.   We will continue to monitor his TSH closely and make dose adjustments of his Synthroid if needed.  His TSH from today  is pending at this time.   For his dry skin, advised to use lotion   The patient was advised to call immediately if she has any concerning symptoms in the interval. The patient voices understanding of current disease status and treatment options and is in agreement with the current care plan. All questions were answered. The patient knows to call the clinic with any problems, questions or concerns. We can certainly see the patient much sooner if necessary     No orders of the defined types were placed in this encounter.    I spent {CHL ONC TIME VISIT - OTRRN:[id number]} counseling the patient face to face. The total time spent in the appointment was {CHL ONC TIME VISIT - XOVAN:[id number]}.  Assia Meanor L Emmarose Klinke, PA-C 11/13/22

## 2022-11-18 ENCOUNTER — Inpatient Hospital Stay: Payer: No Typology Code available for payment source

## 2022-11-18 ENCOUNTER — Inpatient Hospital Stay (HOSPITAL_BASED_OUTPATIENT_CLINIC_OR_DEPARTMENT_OTHER): Payer: No Typology Code available for payment source | Admitting: Physician Assistant

## 2022-11-18 ENCOUNTER — Other Ambulatory Visit: Payer: No Typology Code available for payment source

## 2022-11-18 ENCOUNTER — Ambulatory Visit: Payer: No Typology Code available for payment source

## 2022-11-18 ENCOUNTER — Inpatient Hospital Stay: Payer: No Typology Code available for payment source | Attending: Internal Medicine

## 2022-11-18 ENCOUNTER — Inpatient Hospital Stay: Payer: No Typology Code available for payment source | Admitting: Physician Assistant

## 2022-11-18 VITALS — BP 107/67 | HR 83 | Temp 97.6°F | Resp 18

## 2022-11-18 VITALS — BP 117/64 | HR 88 | Temp 97.5°F | Resp 15 | Wt 241.1 lb

## 2022-11-18 DIAGNOSIS — C3491 Malignant neoplasm of unspecified part of right bronchus or lung: Secondary | ICD-10-CM

## 2022-11-18 DIAGNOSIS — Z79899 Other long term (current) drug therapy: Secondary | ICD-10-CM | POA: Diagnosis not present

## 2022-11-18 DIAGNOSIS — I509 Heart failure, unspecified: Secondary | ICD-10-CM | POA: Diagnosis not present

## 2022-11-18 DIAGNOSIS — C3431 Malignant neoplasm of lower lobe, right bronchus or lung: Secondary | ICD-10-CM | POA: Diagnosis present

## 2022-11-18 DIAGNOSIS — Z5112 Encounter for antineoplastic immunotherapy: Secondary | ICD-10-CM | POA: Diagnosis present

## 2022-11-18 DIAGNOSIS — E1122 Type 2 diabetes mellitus with diabetic chronic kidney disease: Secondary | ICD-10-CM | POA: Diagnosis not present

## 2022-11-18 DIAGNOSIS — N189 Chronic kidney disease, unspecified: Secondary | ICD-10-CM | POA: Insufficient documentation

## 2022-11-18 DIAGNOSIS — I130 Hypertensive heart and chronic kidney disease with heart failure and stage 1 through stage 4 chronic kidney disease, or unspecified chronic kidney disease: Secondary | ICD-10-CM | POA: Diagnosis not present

## 2022-11-18 LAB — CBC WITH DIFFERENTIAL (CANCER CENTER ONLY)
Abs Immature Granulocytes: 0.02 10*3/uL (ref 0.00–0.07)
Basophils Absolute: 0.1 10*3/uL (ref 0.0–0.1)
Basophils Relative: 1 %
Eosinophils Absolute: 0.3 10*3/uL (ref 0.0–0.5)
Eosinophils Relative: 4 %
HCT: 40.7 % (ref 39.0–52.0)
Hemoglobin: 13.5 g/dL (ref 13.0–17.0)
Immature Granulocytes: 0 %
Lymphocytes Relative: 18 %
Lymphs Abs: 1.2 10*3/uL (ref 0.7–4.0)
MCH: 32.7 pg (ref 26.0–34.0)
MCHC: 33.2 g/dL (ref 30.0–36.0)
MCV: 98.5 fL (ref 80.0–100.0)
Monocytes Absolute: 0.6 10*3/uL (ref 0.1–1.0)
Monocytes Relative: 9 %
Neutro Abs: 4.6 10*3/uL (ref 1.7–7.7)
Neutrophils Relative %: 68 %
Platelet Count: 260 10*3/uL (ref 150–400)
RBC: 4.13 MIL/uL — ABNORMAL LOW (ref 4.22–5.81)
RDW: 14.1 % (ref 11.5–15.5)
WBC Count: 6.7 10*3/uL (ref 4.0–10.5)
nRBC: 0 % (ref 0.0–0.2)

## 2022-11-18 LAB — CMP (CANCER CENTER ONLY)
ALT: <5 U/L (ref 0–44)
AST: 10 U/L — ABNORMAL LOW (ref 15–41)
Albumin: 4.3 g/dL (ref 3.5–5.0)
Alkaline Phosphatase: 100 U/L (ref 38–126)
Anion gap: 7 (ref 5–15)
BUN: 24 mg/dL — ABNORMAL HIGH (ref 8–23)
CO2: 29 mmol/L (ref 22–32)
Calcium: 9.8 mg/dL (ref 8.9–10.3)
Chloride: 106 mmol/L (ref 98–111)
Creatinine: 1.73 mg/dL — ABNORMAL HIGH (ref 0.61–1.24)
GFR, Estimated: 40 mL/min — ABNORMAL LOW (ref >60)
Glucose, Bld: 155 mg/dL — ABNORMAL HIGH (ref 70–99)
Potassium: 4.1 mmol/L (ref 3.5–5.1)
Sodium: 142 mmol/L (ref 135–145)
Total Bilirubin: 0.3 mg/dL (ref 0.3–1.2)
Total Protein: 8 g/dL (ref 6.5–8.1)

## 2022-11-18 LAB — TSH: TSH: 14.173 u[IU]/mL — ABNORMAL HIGH (ref 0.350–4.500)

## 2022-11-18 MED ORDER — SODIUM CHLORIDE 0.9 % IV SOLN
1500.0000 mg | Freq: Once | INTRAVENOUS | Status: AC
  Administered 2022-11-18: 1500 mg via INTRAVENOUS
  Filled 2022-11-18: qty 30

## 2022-11-18 MED ORDER — SODIUM CHLORIDE 0.9 % IV SOLN: Freq: Once | INTRAVENOUS | Status: AC

## 2022-11-18 NOTE — Progress Notes (Signed)
[place] [address]  DIAGNOSIS: Stage IIIB (T3b, N2, M0) non-small cell lung cancer, adenocarcinoma presented with large right lower lobe lung mass and suspicious right lower paratracheal lymphadenopathy diagnosed in June 2022.   Molecular studies by [employer] showed no actionable mutations  PRIOR THERAPY: 1) Neoadjuvant systemic chemotherapy according to the Checkmate 816 with carboplatin for AUC of 5, Alimta 500 Mg/M2 and nivolumab 360 Mg IV every 3 weeks for 3 cycles.  Last dose 09/24/21.  Status post 3 cycles 2) 2) Concurrent chemoradiation with carboplatin for an AUC of 2 and paclitaxel 45 mg per metered square.  First dose expected on 11/04/2021.  Status post 7 cycles.  CURRENT THERAPY: Consolidation treatment with immunotherapy with Imfinzi 1500 Mg IV every 4 weeks.  First dose February 12, 2022.  Status post 8 cycles.   INTERVAL HISTORY: Paul [name] 76 y.o. male returns to the clinic today for a follow-up visit.  The patient is feeling fairly well today.   Today he denies any fever, chills, night sweats, or new unexplained weight loss.  He reports good appetite and gained a few pounds.  He denies any chest pain or hemoptysis.  He reports stable shortness of breath.  He reports that he notices shortness of breath after he climbs stairs which improves with rest.  He also reports he has a stable cough which produces clear phlegm.  Of note, his most recent CT scan from 2 month ago noted stable right pleural effusion and post radiation changes in his lungs.    He additionally reports dry skin on his lower extremities.  Sometimes when his skin is flaky and peeling it causes some itching.  He denies any nausea, vomiting, constipation, or diarrhea.  No headaches or vision changes.     He is here today for evaluation and repeat blood work before starting cycle #9  MEDICAL  HISTORY: Past Medical History:  Diagnosis Date   CHF (congestive heart failure) ([place])    Coronary artery disease    Diabetes mellitus without complication ([place])    History of radiation therapy    Right lung- 11/27/21-01/10/22- Dr. [name]   Hypertension    Hypothyroidism    Ischemic cardiomyopathy    Pneumonia    a long time ago   Sleep apnea    no longer uses a cpap    ALLERGIES:  has No Known Allergies.  MEDICATIONS:  Current Outpatient Medications  Medication Sig Dispense Refill   ammonium lactate (LAC-HYDRIN) 12 % lotion Apply 1 Application topically in the morning and at bedtime.     aspirin 81 MG chewable tablet Take 81 mg by mouth daily.     atorvastatin (LIPITOR) 40 MG tablet Take 40 mg by mouth daily.     benzonatate (TESSALON) 100 MG capsule Take 1 capsule (100 mg total) by mouth 3 (three) times daily as needed for cough. 30 capsule 2   blood glucose meter kit and supplies KIT Dispense based on patient and insurance preference. Use up to four times daily as directed. 1 each 0   carvedilol (COREG) 25 MG tablet Take 25 mg by mouth at bedtime.     Insulin Glargine-aglr 100 UNIT/ML SOPN Inject into the skin.     levothyroxine (SYNTHROID) 100 MCG tablet Take 1 tablet (100 mcg total) by mouth daily before breakfast. 30 tablet 2   lidocaine-prilocaine (EMLA) cream Apply 1 application topically as needed. [place]  g 2   Menthol-Methyl Salicylate (MUSCLE RUB) 10-15 % CREA Apply 1 application topically daily as needed for muscle pain.     metFORMIN (GLUCOPHAGE) 500 MG tablet Take 1 tablet (500 mg total) by mouth 2 (two) times daily with a meal. 60 tablet 3   Multiple Vitamins-Minerals (MULTIVITAMIN WITH MINERALS) tablet Take 1 tablet by mouth daily.     nitroGLYCERIN (NITROSTAT) 0.4 MG SL tablet Place 0.4 mg under the tongue every 5 (five) minutes as needed for chest pain.     prochlorperazine (COMPAZINE) 10 MG tablet Take 1 tablet (10 mg total) by mouth every 6 (six) hours as  needed for nausea or vomiting. 30 tablet 0   urea (CARMOL) 20 % cream Apply 1 Application topically 2 (two) times daily.     No current facility-administered medications for this visit.    SURGICAL HISTORY:  Past Surgical History:  Procedure Laterality Date   BRONCHIAL BIOPSY  06/04/2021   Procedure: BRONCHIAL BIOPSIES;  Surgeon: Garner Nash, DO;  Location: [facility];  Service: Pulmonary;;   BRONCHIAL BRUSHINGS  06/04/2021   Procedure: BRONCHIAL BRUSHINGS;  Surgeon: Garner Nash, DO;  Location: [facility];  Service: Pulmonary;;   BRONCHIAL NEEDLE ASPIRATION BIOPSY  06/04/2021   Procedure: BRONCHIAL NEEDLE ASPIRATION BIOPSIES;  Surgeon: Garner Nash, DO;  Location: [facility];  Service: Pulmonary;;   BRONCHIAL WASHINGS  06/04/2021   Procedure: BRONCHIAL WASHINGS;  Surgeon: Garner Nash, DO;  Location: [facility];  Service: Pulmonary;;   CARDIAC SURGERY     COLONOSCOPY     CORONARY ARTERY BYPASS GRAFT     2019 at [place] Right 06/04/2021   Procedure: [place];  Surgeon: Garner Nash, DO;  Location: [facility];  Service: Pulmonary;  Laterality: Right;   VIDEO BRONCHOSCOPY WITH ENDOBRONCHIAL ULTRASOUND  06/04/2021   Procedure: VIDEO BRONCHOSCOPY WITH ENDOBRONCHIAL ULTRASOUND;  Surgeon: Garner Nash, DO;  Location: [facility];  Service: Pulmonary;;    REVIEW OF SYSTEMS:   Review of Systems  Constitutional: Negative for appetite change, chills, fatigue, fever and unexpected weight change.  HENT:   Negative for mouth sores, nosebleeds, sore throat and trouble swallowing.   Eyes: Negative for eye problems and icterus.  Respiratory: Positive for stable dyspnea on exertion and cough.  Negative for hemoptysis and wheezing.   Cardiovascular: Negative for chest pain and leg swelling.  Gastrointestinal: Negative for abdominal pain, constipation, diarrhea, nausea and vomiting.   Genitourinary: Negative for bladder incontinence, difficulty urinating, dysuria, frequency and hematuria.   Musculoskeletal: Negative for back pain, gait problem, neck pain and neck stiffness.  Skin: Positive for dry flaky skin on lower extremities. Neurological: Negative for dizziness, extremity weakness, gait problem, headaches, light-headedness and seizures.  Hematological: Negative for adenopathy. Does not bruise/bleed easily.  Psychiatric/Behavioral: Negative for confusion, depression and sleep disturbance. The patient is not nervous/anxious.     PHYSICAL EXAMINATION:  There were no vitals taken for this visit.  ECOG PERFORMANCE STATUS: 1  Physical Exam  Constitutional: Oriented to person, place, and time and well-developed, well-nourished, and in no distress.  HENT:  Head: Normocephalic and atraumatic.  Mouth/Throat: Oropharynx is clear and moist. No oropharyngeal exudate.  Eyes: Conjunctivae are normal. Right eye exhibits no discharge. Left eye exhibits no discharge. No scleral icterus.  Neck: Normal range of motion. Neck supple.  Cardiovascular: Normal rate, regular rhythm, normal heart sounds and intact distal pulses.   Pulmonary/Chest: Effort normal.  Decreased breath sounds in right lung base.  No respiratory distress. No wheezes. No rales.  Abdominal: Soft. Bowel sounds are normal. Exhibits no distension and no mass. There is no tenderness.  Musculoskeletal: Normal range of motion. Exhibits no edema.  Lymphadenopathy:    No cervical adenopathy.  Neurological: Alert and oriented to person, place, and time. Exhibits normal muscle tone. Gait normal. Coordination normal.  Skin: Skin is warm and dry. Dry skin on lower extremities. Not diaphoretic. No erythema. No pallor.  Psychiatric: Mood, memory and judgment normal.  Vitals reviewed.  LABORATORY DATA: Lab Results  Component Value Date   WBC 6.7 11/18/2022   HGB 13.5 11/18/2022   HCT 40.7 11/18/2022   MCV 98.5  11/18/2022   PLT 260 11/18/2022      Chemistry      Component Value Date/Time   NA 142 10/22/2022 1012   K 3.9 10/22/2022 1012   CL 104 10/22/2022 1012   CO2 29 10/22/2022 1012   BUN 30 (H) 10/22/2022 1012   CREATININE 1.77 (H) 10/22/2022 1012      Component Value Date/Time   CALCIUM 9.6 10/22/2022 1012   ALKPHOS 103 10/22/2022 1012   AST 10 (L) 10/22/2022 1012   ALT 5 10/22/2022 1012   BILITOT 0.4 10/22/2022 1012       RADIOGRAPHIC STUDIES:  No results found.   ASSESSMENT/PLAN:  This is a very pleasant 76 year old African-American male diagnosed with stage IIIb (T3b, N2, M0) non-small cell lung cancer, adenocarcinoma.  He presented with a large right upper lobe lung mass and suspicious right lower lobe paratracheal lymphadenopathy.  He was diagnosed in June 2022.  His molecular studies by guardant 360 were negative for any actionable mutations.   The patient completed 3 cycles of neoadjuvant chemotherapy according to the Checkmate 816 trial with carboplatin for an AUC of 5, Alimta 500 mg per metered square, and nivolumab 360 mg IV every 3 weeks.  The patient status post 3 cycles.  His last dose was on 09/24/2021.  Upon reimaging after completion of this course, there was not significant improvement in the size of his mass and therefore, it does not appear that we are able to downstage his disease to make him eligible for surgical resection.   He completed a course of concurrent chemoradiation with weekly carboplatin for AUC of 2 and paclitaxel 45 Mg/M2 status post 7 cycles.  Last cycle was given January 02, 2022.     The patient is currently on consolidation treatment with immunotherapy with Imfinzi 1500 Mg IV every 4 weeks status post 8 cycles.  He tolerated this fairly well.    Labs were reviewed. He has similar CKD with creatinine of 1.73 today.  Recommend that he proceed cycle #9 today scheduled.   We will see him back for follow-up visit in 4 weeks for evaluation before  starting cycle #10.  I will arrange for a restaging CT of the chest prior to his next appointment. I will order without contrast due to his CKD. We will reassess his pleural effusion on upcoming imaging.   We will continue to monitor his TSH closely and make dose adjustments of his Synthroid if needed.  His TSH from today is pending at this time.   For his dry skin, advised to use lotion on his legs.  Discussed with him that immunotherapy can cause dry skin.  He was instructed to use Mucinex for his phlegm.  The patient reports stable dyspnea on exertion which improves with  rest and similar cough.  I did remind the patient that he does have a moderate right pleural effusion and if he ever notices changes in his shortness of breath to please call us for evaluation sooner and not to wait until he has severe shortness of breath that would be an emergency situation.  I did review the process of a thoracentesis with the patient today if needed.  His oxygen is 100% on room air.  He is going to monitor his symptoms for now.  The patient was advised to call immediately if he has any concerning symptoms in the interval. The patient voices understanding of current disease status and treatment options and is in agreement with the current care plan. All questions were answered. The patient knows to call the clinic with any problems, questions or concerns. We can certainly see the patient much sooner if necessary    Orders Placed This Encounter  Procedures   CT Chest Wo Contrast    Standing Status:   Future    Standing Expiration Date:   11/18/2023    Scheduling Instructions:     Please call his mobile number    Order Specific Question:   Preferred imaging location?    Answer:   [facility]      The total time spent in the appointment was 20-29 minutes.   Nitara Szczerba L Babe Clenney, PA-C 11/18/22

## 2022-11-18 NOTE — Progress Notes (Signed)
Ok to treat with creat 1.73 mg/dL per [place], PA

## 2022-11-18 NOTE — Patient Instructions (Signed)
[facility]  Discharge Instructions: Thank you for choosing [facility] to provide your oncology and hematology care.   If you have a lab appointment with the [facility], please go directly to the [facility] and check in at the registration area.   Wear comfortable clothing and clothing appropriate for easy access to any Portacath or PICC line.   We strive to give you quality time with your provider. You may need to reschedule your appointment if you arrive late (15 or more minutes).  Arriving late affects you and other patients whose appointments are after yours.  Also, if you miss three or more appointments without notifying the office, you may be dismissed from the clinic at the provider's discretion.      For prescription refill requests, have your pharmacy contact our office and allow 72 hours for refills to be completed.    Today you received the following chemotherapy and/or immunotherapy agents: Durvalumab      To help prevent nausea and vomiting after your treatment, we encourage you to take your nausea medication as directed.  BELOW ARE SYMPTOMS THAT SHOULD BE REPORTED IMMEDIATELY: *FEVER GREATER THAN 100.4 F (38 C) OR HIGHER *CHILLS OR SWEATING *NAUSEA AND VOMITING THAT IS NOT CONTROLLED WITH YOUR NAUSEA MEDICATION *UNUSUAL SHORTNESS OF BREATH *UNUSUAL BRUISING OR BLEEDING *URINARY PROBLEMS (pain or burning when urinating, or frequent urination) *BOWEL PROBLEMS (unusual diarrhea, constipation, pain near the anus) TENDERNESS IN MOUTH AND THROAT WITH OR WITHOUT PRESENCE OF ULCERS (sore throat, sores in mouth, or a toothache) UNUSUAL RASH, SWELLING OR PAIN  UNUSUAL VAGINAL DISCHARGE OR ITCHING   Items with * indicate a potential emergency and should be followed up as soon as possible or go to the Emergency Department if any problems should occur.  Please show the CHEMOTHERAPY ALERT CARD or IMMUNOTHERAPY ALERT CARD at check-in to  the Emergency Department and triage nurse.  Should you have questions after your visit or need to cancel or reschedule your appointment, please contact [facility]  Dept: [phone]  and follow the prompts.  Office hours are 8:00 a.m. to 4:30 p.m. Monday - Friday. Please note that voicemails left after 4:00 p.m. may not be returned until the following business day.  We are closed weekends and major holidays. You have access to a nurse at all times for urgent questions. Please call the main number to the clinic Dept: [phone] and follow the prompts.   For any non-urgent questions, you may also contact your provider using MyChart. We now offer e-Visits for anyone 67 and older to request care online for non-urgent symptoms. For details visit [email or web address].   Also download the MyChart app! Go to the app store, search "MyChart", open the app, select [facility], and log in with your MyChart username and password.  Masks are optional in the cancer centers. If you would like for your care team to wear a mask while they are taking care of you, please let them know. You may have one support person who is at least 78 years old accompany you for your appointments.

## 2022-11-19 ENCOUNTER — Other Ambulatory Visit: Payer: No Typology Code available for payment source

## 2022-11-19 ENCOUNTER — Ambulatory Visit: Payer: No Typology Code available for payment source | Admitting: Physician Assistant

## 2022-11-19 ENCOUNTER — Ambulatory Visit: Payer: No Typology Code available for payment source

## 2022-11-29 ENCOUNTER — Other Ambulatory Visit: Payer: Self-pay

## 2022-12-16 NOTE — Progress Notes (Addendum)
[place] [address]  DIAGNOSIS:  Stage IIIB (T3b, N2, M0) non-small cell lung cancer, adenocarcinoma presented with large right lower lobe lung mass and suspicious right lower paratracheal lymphadenopathy diagnosed in June 2022.   Molecular studies by [employer] showed no actionable mutations  PRIOR THERAPY: 1) Neoadjuvant systemic chemotherapy according to the Checkmate 816 with carboplatin for AUC of 5, Alimta 500 Mg/M2 and nivolumab 360 Mg IV every 3 weeks for 3 cycles.  Last dose 09/24/21.  Status post 3 cycles 2) 2) Concurrent chemoradiation with carboplatin for an AUC of 2 and paclitaxel 45 mg per metered square.  First dose expected on 11/04/2021.  Status post 7 cycles.  CURRENT THERAPY: Consolidation treatment with immunotherapy with Imfinzi 1500 Mg IV every 4 weeks.  First dose February 12, 2022.  Status post 9 cycles.   INTERVAL HISTORY: Paul [name] 76 y.o. male returns to the clinic today for a follow up visit.The patient is feeling fairly well today. In the interval since last being seen, he was seen for for sialadenitis. His symptoms have improved at this time.   Today, he denies any fever, chills, night sweats, or new unexplained weight loss.  He reports good appetite. He does think he is getting more winded with minimal exertion. He has a cough but coughs up very little. He continues to smoke cigarettes.  He denies any chest pain or hemoptysis.    He additionally reports dry skin on his lower extremities.  Sometimes when his skin is flaky and peeling it causes some itching.  He denies any nausea, vomiting, constipation, or diarrhea.  No headaches or vision changes.  He recently had a restaging CT scan performed.   He is here today for evaluation and to review his scan results before before starting cycle #10  MEDICAL HISTORY: Past Medical History:  Diagnosis Date    CHF (congestive heart failure) ([facility])    Coronary artery disease    Diabetes mellitus without complication ([facility])    History of radiation therapy    Right lung- 11/27/21-01/10/22- Dr. [name]   Hypertension    Hypothyroidism    Ischemic cardiomyopathy    Pneumonia    a long time ago   Sleep apnea    no longer uses a cpap    ALLERGIES:  has No Known Allergies.  MEDICATIONS:  Current Outpatient Medications  Medication Sig Dispense Refill   ammonium lactate (LAC-HYDRIN) 12 % lotion Apply 1 Application topically in the morning and at bedtime.     aspirin 81 MG chewable tablet Take 81 mg by mouth daily.     atorvastatin (LIPITOR) 40 MG tablet Take 40 mg by mouth daily.     benzonatate (TESSALON) 100 MG capsule Take 1 capsule (100 mg total) by mouth 3 (three) times daily as needed for cough. 30 capsule 2   blood glucose meter kit and supplies KIT Dispense based on patient and insurance preference. Use up to four times daily as directed. 1 each 0   carvedilol (COREG) 25 MG tablet Take 25 mg by mouth at bedtime.     Insulin Glargine-aglr 100 UNIT/ML SOPN Inject into the skin.     levothyroxine (SYNTHROID) 100 MCG tablet Take 1 tablet (100 mcg total) by mouth daily before breakfast. 30 tablet 2   lidocaine-prilocaine (EMLA) cream Apply 1 application topically as needed. 30 g 2   Menthol-Methyl Salicylate (MUSCLE RUB) 10-15 % CREA  Apply 1 application topically daily as needed for muscle pain.     Multiple Vitamins-Minerals (MULTIVITAMIN WITH MINERALS) tablet Take 1 tablet by mouth daily.     nitroGLYCERIN (NITROSTAT) 0.4 MG SL tablet Place 0.4 mg under the tongue every 5 (five) minutes as needed for chest pain.     prochlorperazine (COMPAZINE) 10 MG tablet Take 1 tablet (10 mg total) by mouth every 6 (six) hours as needed for nausea or vomiting. 30 tablet 0   urea (CARMOL) 20 % cream Apply 1 Application topically 2 (two) times daily.     metFORMIN (GLUCOPHAGE) 500 MG tablet Take 1 tablet  (500 mg total) by mouth 2 (two) times daily with a meal. 60 tablet 3   No current facility-administered medications for this visit.    SURGICAL HISTORY:  Past Surgical History:  Procedure Laterality Date   BRONCHIAL BIOPSY  06/04/2021   Procedure: BRONCHIAL BIOPSIES;  Surgeon: Garner Nash, DO;  Location: [facility];  Service: Pulmonary;;   BRONCHIAL BRUSHINGS  06/04/2021   Procedure: BRONCHIAL BRUSHINGS;  Surgeon: Garner Nash, DO;  Location: [facility];  Service: Pulmonary;;   BRONCHIAL NEEDLE ASPIRATION BIOPSY  06/04/2021   Procedure: BRONCHIAL NEEDLE ASPIRATION BIOPSIES;  Surgeon: Garner Nash, DO;  Location: [facility];  Service: Pulmonary;;   BRONCHIAL WASHINGS  06/04/2021   Procedure: BRONCHIAL WASHINGS;  Surgeon: Garner Nash, DO;  Location: [facility];  Service: Pulmonary;;   CARDIAC SURGERY     COLONOSCOPY     CORONARY ARTERY BYPASS GRAFT     2019 at [facility] Right 06/04/2021   Procedure: [place];  Surgeon: Garner Nash, DO;  Location: [facility];  Service: Pulmonary;  Laterality: Right;   VIDEO BRONCHOSCOPY WITH ENDOBRONCHIAL ULTRASOUND  06/04/2021   Procedure: VIDEO BRONCHOSCOPY WITH ENDOBRONCHIAL ULTRASOUND;  Surgeon: Garner Nash, DO;  Location: [facility];  Service: Pulmonary;;    REVIEW OF SYSTEMS:   Review of Systems  Constitutional: Negative for appetite change, chills, fatigue, fever and unexpected weight change.  HENT:   Negative for mouth sores, nosebleeds, sore throat and trouble swallowing.   Eyes: Negative for eye problems and icterus.  Respiratory: Positive for dyspnea on exertion and cough.  Negative for hemoptysis and wheezing.   Cardiovascular: Negative for chest pain and leg swelling.  Gastrointestinal: Negative for abdominal pain, constipation, diarrhea, nausea and vomiting.  Genitourinary: Negative for bladder incontinence,  difficulty urinating, dysuria, frequency and hematuria.   Musculoskeletal: Negative for back pain, gait problem, neck pain and neck stiffness.  Skin: Positive for dry flaky skin on lower extremities. Neurological: Negative for dizziness, extremity weakness, gait problem, headaches, light-headedness and seizures.  Hematological: Negative for adenopathy. Does not bruise/bleed easily.  Psychiatric/Behavioral: Negative for confusion, depression and sleep disturbance. The patient is not nervous/anxious.     PHYSICAL EXAMINATION:  Blood pressure 108/67, pulse 85, temperature (!) 97.4 F (36.3 C), temperature source Oral, resp. rate (!) 30, weight 241 lb 14.4 oz (109.7 kg), SpO2 96 %.  ECOG PERFORMANCE STATUS: 1  Physical Exam  Constitutional: Oriented to person, place, and time and well-developed, well-nourished, and in no distress.  HENT:  Head: Normocephalic and atraumatic.  Mouth/Throat: Oropharynx is clear and moist. No oropharyngeal exudate.  Eyes: Conjunctivae are normal. Right eye exhibits no discharge. Left eye exhibits no discharge. No scleral icterus.  Neck: Normal range of motion. Neck supple.  Cardiovascular: Normal rate, regular rhythm, normal heart sounds and  intact distal pulses.   Pulmonary/Chest: Effort normal.  Decreased breath sounds in right lung base.  No respiratory distress. No wheezes. No rales.  Abdominal: Soft. Bowel sounds are normal. Exhibits no distension and no mass. There is no tenderness.  Musculoskeletal: Normal range of motion. Exhibits no edema.  Lymphadenopathy:    No cervical adenopathy.  Neurological: Alert and oriented to person, place, and time. Exhibits normal muscle tone. Gait normal. Coordination normal.  Skin: Skin is warm and dry. Dry skin on lower extremities. Not diaphoretic. No erythema. No pallor.  Psychiatric: Mood, memory and judgment normal.  Vitals reviewed  LABORATORY DATA: Lab Results  Component Value Date   WBC 7.4 12/18/2022   HGB  12.4 (L) 12/18/2022   HCT 37.7 (L) 12/18/2022   MCV 95.4 12/18/2022   PLT 291 12/18/2022      Chemistry      Component Value Date/Time   NA 142 12/18/2022 1036   K 4.2 12/18/2022 1036   CL 106 12/18/2022 1036   CO2 27 12/18/2022 1036   BUN 41 (H) 12/18/2022 1036   CREATININE 1.95 (H) 12/18/2022 1036      Component Value Date/Time   CALCIUM 9.7 12/18/2022 1036   ALKPHOS 109 12/18/2022 1036   AST 13 (L) 12/18/2022 1036   ALT 6 12/18/2022 1036   BILITOT 0.3 12/18/2022 1036       RADIOGRAPHIC STUDIES:  CT Chest Wo Contrast  Result Date: 12/18/2022 CLINICAL DATA:  Non-small cell lung cancer, assess treatment response * Tracking Code: BO * EXAM: CT CHEST WITHOUT CONTRAST TECHNIQUE: Multidetector CT imaging of the chest was performed following the standard protocol without IV contrast. RADIATION DOSE REDUCTION: This exam was performed according to the departmental dose-optimization program which includes automated exposure control, adjustment of the mA and/or kV according to patient size and/or use of iterative reconstruction technique. COMPARISON:  09/22/2022 FINDINGS: Cardiovascular: Aortic atherosclerosis. Normal heart size. Three-vessel coronary artery calcifications status post median sternotomy and CABG. No pericardial effusion. Mediastinum/Nodes: Unchanged prominent pretracheal lymph nodes. Thyroid gland, trachea, and esophagus demonstrate no significant findings. Lungs/Pleura: Small, loculated right pleural effusion, slightly diminished compared to prior examination. Unchanged post treatment/post radiation appearance of the perihilar and infrahilar right lung with dense consolidation and fibrosis. Additional fibrotic scarring of the infrahilar left lower lobe (series 5, image 110)., underlying pulmonary fibrosis irregular peripheral interstitial opacity and septal thickening. Upper Abdomen: No acute abnormality. Musculoskeletal: No chest wall abnormality. No acute osseous findings.  IMPRESSION: 1. Unchanged post treatment/post radiation appearance of the perihilar and infrahilar right lung with dense consolidation and fibrosis. Additional fibrotic scarring of the infrahilar left lower lobe. 2. Small, loculated right pleural effusion, slightly diminished compared to prior examination. 3. Mild underlying pulmonary fibrosis featuring irregular peripheral interstitial opacity and septal thickening. 4. Unchanged prominent pretracheal lymph nodes, likely reactive. Attention on follow-up. 5. Coronary artery disease. Aortic Atherosclerosis (ICD10-I70.0). Electronically Signed   By: Delanna Ahmadi M.D.   On: 12/18/2022 10:13     ASSESSMENT/PLAN:  This is a very pleasant 76 year old African-American male diagnosed with stage IIIb (T3b, N2, M0) non-small cell lung cancer, adenocarcinoma.  He presented with a large right upper lobe lung mass and suspicious right lower lobe paratracheal lymphadenopathy.  He was diagnosed in June 2022.  His molecular studies by [employer] 360 were negative for any actionable mutations.   The patient completed 3 cycles of neoadjuvant chemotherapy according to the Checkmate 816 trial with carboplatin for an AUC of 5, Alimta 500 mg per  metered square, and nivolumab 360 mg IV every 3 weeks.  The patient status post 3 cycles.  His last dose was on 09/24/2021.  Upon reimaging after completion of this course, there was not significant improvement in the size of his mass and therefore, it does not appear that we are able to downstage his disease to make him eligible for surgical resection.   He completed a course of concurrent chemoradiation with weekly carboplatin for AUC of 2 and paclitaxel 45 Mg/M2 status post 7 cycles.  Last cycle was given January 02, 2022.     The patient is currently on consolidation treatment with immunotherapy with Imfinzi 1500 Mg IV every 4 weeks status post 9 cycles.    The patient recently had a restaging CT scan performed.  Dr. Julien Nordmann personally  and independently reviewed the scan and discussed the results with the patient today.  The scan showed no evidence of disease progression. There are no acute findings to explain his worsening dyspnea and cough.  He does have some pulmonary fibrosis and radiation changes in his lung.  The patient is strongly encouraged to quit smoking which is likely related to his cough.  Dr. Julien Nordmann recommends that the patient proceed on the same treatment at the same dose.  We will see him back for follow-up visit in 4 weeks for evaluation repeat blood work.  Labs were reviewed.  The patient has baseline CKD with a creatinine of 1.95. Dr. Julien Nordmann recommends that he proceed with cycle 10 today scheduled.  Strongly encouraged the patient to increase his hydration at home as his kidney function has bumped up slightly.  The patient was advised to call immediately if he has any concerning symptoms in the interval. The patient voices understanding of current disease status and treatment options and is in agreement with the current care plan. All questions were answered. The patient knows to call the clinic with any problems, questions or concerns. We can certainly see the patient much sooner if necessary         No orders of the defined types were placed in this encounter.      Bailey Faiella L Malaky Tetrault, PA-C 12/18/22  ADDENDUM: Hematology/Oncology Attending: I had a face-to-face encounter with the patient today.  I reviewed his record, lab, scan and recommended his care plan.  This is a very pleasant 76 years old African-American male with a stage IIIb non-small cell lung cancer, adenocarcinoma diagnosed in June 2022 with no actionable mutations.  Status post neoadjuvant systemic chemotherapy for 3 cycles with carboplatin, Alimta and nivolumab but the patient was not a good candidate for surgical resection after his treatment.  He proceeded with a course of concurrent chemoradiation with weekly carboplatin  and paclitaxel for 7 cycles with partial response.  He is currently undergoing treatment with consolidation immunotherapy with Imfinzi 1500 Mg IV every 4 weeks status post 9 cycles.  He has been tolerating this treatment well with no concerning adverse effects. He had repeat CT scan of the chest performed recently.  I personally and independently reviewed the scan and discussed the result with the patient today. His scan showed no concerning findings for disease progression. I recommended for him to continue his current maintenance treatment with Imfinzi and he will proceed with cycle #10 today. He will come back for follow-up visit in 4 weeks for evaluation before the next cycle of his treatment. The patient was advised to call immediately if he has any other concerning symptoms in the interval. The  total time spent in the appointment was 30 minutes. Disclaimer: This note was dictated with voice recognition software. Similar sounding words can inadvertently be transcribed and may be missed upon review. Eilleen Kempf, MD

## 2022-12-17 ENCOUNTER — Ambulatory Visit: Payer: No Typology Code available for payment source | Admitting: Physician Assistant

## 2022-12-17 ENCOUNTER — Ambulatory Visit: Payer: No Typology Code available for payment source

## 2022-12-17 ENCOUNTER — Ambulatory Visit (HOSPITAL_COMMUNITY)
Admission: RE | Admit: 2022-12-17 | Discharge: 2022-12-17 | Disposition: A | Discharge status: Home / Self Care | Payer: No Typology Code available for payment source | Source: Ambulatory Visit | Attending: Physician Assistant | Admitting: Physician Assistant

## 2022-12-17 ENCOUNTER — Other Ambulatory Visit: Payer: No Typology Code available for payment source

## 2022-12-17 DIAGNOSIS — C3491 Malignant neoplasm of unspecified part of right bronchus or lung: Secondary | ICD-10-CM | POA: Insufficient documentation

## 2022-12-18 ENCOUNTER — Other Ambulatory Visit: Payer: Self-pay

## 2022-12-18 ENCOUNTER — Inpatient Hospital Stay: Payer: No Typology Code available for payment source | Attending: Internal Medicine

## 2022-12-18 ENCOUNTER — Inpatient Hospital Stay (HOSPITAL_BASED_OUTPATIENT_CLINIC_OR_DEPARTMENT_OTHER): Payer: No Typology Code available for payment source | Admitting: Physician Assistant

## 2022-12-18 ENCOUNTER — Inpatient Hospital Stay: Payer: No Typology Code available for payment source

## 2022-12-18 VITALS — BP 108/67 | HR 85 | Temp 97.4°F | Resp 30 | Wt 241.9 lb

## 2022-12-18 VITALS — Resp 20

## 2022-12-18 DIAGNOSIS — Z923 Personal history of irradiation: Secondary | ICD-10-CM | POA: Diagnosis not present

## 2022-12-18 DIAGNOSIS — C3491 Malignant neoplasm of unspecified part of right bronchus or lung: Secondary | ICD-10-CM

## 2022-12-18 DIAGNOSIS — N1831 Chronic kidney disease, stage 3a: Secondary | ICD-10-CM

## 2022-12-18 DIAGNOSIS — I509 Heart failure, unspecified: Secondary | ICD-10-CM | POA: Diagnosis not present

## 2022-12-18 DIAGNOSIS — E1122 Type 2 diabetes mellitus with diabetic chronic kidney disease: Secondary | ICD-10-CM | POA: Diagnosis not present

## 2022-12-18 DIAGNOSIS — C3431 Malignant neoplasm of lower lobe, right bronchus or lung: Secondary | ICD-10-CM | POA: Insufficient documentation

## 2022-12-18 DIAGNOSIS — F17210 Nicotine dependence, cigarettes, uncomplicated: Secondary | ICD-10-CM | POA: Diagnosis not present

## 2022-12-18 DIAGNOSIS — N189 Chronic kidney disease, unspecified: Secondary | ICD-10-CM | POA: Insufficient documentation

## 2022-12-18 DIAGNOSIS — Z5112 Encounter for antineoplastic immunotherapy: Secondary | ICD-10-CM | POA: Diagnosis not present

## 2022-12-18 DIAGNOSIS — Z79899 Other long term (current) drug therapy: Secondary | ICD-10-CM | POA: Insufficient documentation

## 2022-12-18 DIAGNOSIS — J8410 Pulmonary fibrosis, unspecified: Secondary | ICD-10-CM | POA: Diagnosis not present

## 2022-12-18 DIAGNOSIS — I130 Hypertensive heart and chronic kidney disease with heart failure and stage 1 through stage 4 chronic kidney disease, or unspecified chronic kidney disease: Secondary | ICD-10-CM | POA: Insufficient documentation

## 2022-12-18 LAB — CBC WITH DIFFERENTIAL (CANCER CENTER ONLY)
Abs Immature Granulocytes: 0.02 10*3/uL (ref 0.00–0.07)
Basophils Absolute: 0.1 10*3/uL (ref 0.0–0.1)
Basophils Relative: 1 %
Eosinophils Absolute: 0.3 10*3/uL (ref 0.0–0.5)
Eosinophils Relative: 5 %
HCT: 37.7 % — ABNORMAL LOW (ref 39.0–52.0)
Hemoglobin: 12.4 g/dL — ABNORMAL LOW (ref 13.0–17.0)
Immature Granulocytes: 0 %
Lymphocytes Relative: 20 %
Lymphs Abs: 1.5 10*3/uL (ref 0.7–4.0)
MCH: 31.4 pg (ref 26.0–34.0)
MCHC: 32.9 g/dL (ref 30.0–36.0)
MCV: 95.4 fL (ref 80.0–100.0)
Monocytes Absolute: 1 10*3/uL (ref 0.1–1.0)
Monocytes Relative: 14 %
Neutro Abs: 4.5 10*3/uL (ref 1.7–7.7)
Neutrophils Relative %: 60 %
Platelet Count: 291 10*3/uL (ref 150–400)
RBC: 3.95 MIL/uL — ABNORMAL LOW (ref 4.22–5.81)
RDW: 14.3 % (ref 11.5–15.5)
WBC Count: 7.4 10*3/uL (ref 4.0–10.5)
nRBC: 0 % (ref 0.0–0.2)

## 2022-12-18 LAB — CMP (CANCER CENTER ONLY)
ALT: 6 U/L (ref 0–44)
AST: 13 U/L — ABNORMAL LOW (ref 15–41)
Albumin: 3.9 g/dL (ref 3.5–5.0)
Alkaline Phosphatase: 109 U/L (ref 38–126)
Anion gap: 9 (ref 5–15)
BUN: 41 mg/dL — ABNORMAL HIGH (ref 8–23)
CO2: 27 mmol/L (ref 22–32)
Calcium: 9.7 mg/dL (ref 8.9–10.3)
Chloride: 106 mmol/L (ref 98–111)
Creatinine: 1.95 mg/dL — ABNORMAL HIGH (ref 0.61–1.24)
GFR, Estimated: 35 mL/min — ABNORMAL LOW (ref >60)
Glucose, Bld: 136 mg/dL — ABNORMAL HIGH (ref 70–99)
Potassium: 4.2 mmol/L (ref 3.5–5.1)
Sodium: 142 mmol/L (ref 135–145)
Total Bilirubin: 0.3 mg/dL (ref 0.3–1.2)
Total Protein: 7.6 g/dL (ref 6.5–8.1)

## 2022-12-18 LAB — TSH: TSH: 7.77 u[IU]/mL — ABNORMAL HIGH (ref 0.350–4.500)

## 2022-12-18 MED ORDER — SODIUM CHLORIDE 0.9 % IV SOLN
1500.0000 mg | Freq: Once | INTRAVENOUS | Status: AC
  Administered 2022-12-18: 1500 mg via INTRAVENOUS
  Filled 2022-12-18: qty 30

## 2022-12-18 MED ORDER — SODIUM CHLORIDE 0.9 % IV SOLN: Freq: Once | INTRAVENOUS | Status: AC

## 2022-12-18 NOTE — Patient Instructions (Signed)
[facility]  Discharge Instructions: Thank you for choosing [facility] to provide your oncology and hematology care.   If you have a lab appointment with the [facility], please go directly to the [facility] and check in at the registration area.   Wear comfortable clothing and clothing appropriate for easy access to any Portacath or PICC line.   We strive to give you quality time with your provider. You may need to reschedule your appointment if you arrive late (15 or more minutes).  Arriving late affects you and other patients whose appointments are after yours.  Also, if you miss three or more appointments without notifying the office, you may be dismissed from the clinic at the provider's discretion.      For prescription refill requests, have your pharmacy contact our office and allow 72 hours for refills to be completed.    Today you received the following chemotherapy and/or immunotherapy agents: Durvalumab      To help prevent nausea and vomiting after your treatment, we encourage you to take your nausea medication as directed.  BELOW ARE SYMPTOMS THAT SHOULD BE REPORTED IMMEDIATELY: *FEVER GREATER THAN 100.4 F (38 C) OR HIGHER *CHILLS OR SWEATING *NAUSEA AND VOMITING THAT IS NOT CONTROLLED WITH YOUR NAUSEA MEDICATION *UNUSUAL SHORTNESS OF BREATH *UNUSUAL BRUISING OR BLEEDING *URINARY PROBLEMS (pain or burning when urinating, or frequent urination) *BOWEL PROBLEMS (unusual diarrhea, constipation, pain near the anus) TENDERNESS IN MOUTH AND THROAT WITH OR WITHOUT PRESENCE OF ULCERS (sore throat, sores in mouth, or a toothache) UNUSUAL RASH, SWELLING OR PAIN  UNUSUAL VAGINAL DISCHARGE OR ITCHING   Items with * indicate a potential emergency and should be followed up as soon as possible or go to the Emergency Department if any problems should occur.  Please show the CHEMOTHERAPY ALERT CARD or IMMUNOTHERAPY ALERT CARD at check-in to  the Emergency Department and triage nurse.  Should you have questions after your visit or need to cancel or reschedule your appointment, please contact [facility]  Dept: [phone]  and follow the prompts.  Office hours are 8:00 a.m. to 4:30 p.m. Monday - Friday. Please note that voicemails left after 4:00 p.m. may not be returned until the following business day.  We are closed weekends and major holidays. You have access to a nurse at all times for urgent questions. Please call the main number to the clinic Dept: [phone] and follow the prompts.   For any non-urgent questions, you may also contact your provider using MyChart. We now offer e-Visits for anyone 61 and older to request care online for non-urgent symptoms. For details visit [email or web address].   Also download the MyChart app! Go to the app store, search "MyChart", open the app, select [facility], and log in with your MyChart username and password.  Masks are optional in the cancer centers. If you would like for your care team to wear a mask while they are taking care of you, please let them know. You may have one support person who is at least 78 years old accompany you for your appointments.

## 2022-12-18 NOTE — Progress Notes (Signed)
Per Malone, PA office visit note, ok to proceed today with elevated Scr.  Laray Anger, PharmD PGY-2 Pharmacy Resident Hematology/Oncology [phone]  12/18/2022 11:48 AM

## 2022-12-19 ENCOUNTER — Other Ambulatory Visit: Payer: Self-pay

## 2022-12-21 ENCOUNTER — Other Ambulatory Visit: Payer: Self-pay

## 2023-01-05 ENCOUNTER — Telehealth: Payer: Self-pay | Admitting: Internal Medicine

## 2023-01-05 NOTE — Telephone Encounter (Signed)
Called patient regarding upcoming January and February appointments, patient is notified.

## 2023-01-09 NOTE — Progress Notes (Signed)
[facility] OFFICE PROGRESS NOTE  Clinic, [name] [address] [facility] [place] [address]  DIAGNOSIS: Stage IIIB (T3b, N2, M0) non-small cell lung cancer, adenocarcinoma presented with large right lower lobe lung mass and suspicious right lower paratracheal lymphadenopathy diagnosed in June 2022.   Molecular studies by [employer] 360 showed no actionable mutations  PRIOR THERAPY: 1) Neoadjuvant systemic chemotherapy according to the Checkmate 816 with carboplatin for AUC of 5, Alimta 500 Mg/M2 and nivolumab 360 Mg IV every 3 weeks for 3 cycles.  Last dose 09/24/21.  Status post 3 cycles 2) 2) Concurrent chemoradiation with carboplatin for an AUC of 2 and paclitaxel 45 mg per metered square.  First dose expected on 11/04/2021.  Status post 7 cycles.  CURRENT THERAPY: Consolidation treatment with immunotherapy with Imfinzi 1500 Mg IV every 4 weeks.  First dose February 12, 2022.  Status post 11 cycles.    INTERVAL HISTORY: Paul [name] 77 y.o. male returns to the clinic today for a follow up visit. The patient is feeling fairly well today.  Today, he denies any fever, chills, night sweats, or new unexplained weight loss.  He reports good appetite. He reports similar baseline cough and dyspnea with exertion. He continues to smoke cigarettes.  He denies any chest pain or hemoptysis.    He additionally reports dry skin on his lower extremities. He denies any nausea, vomiting, constipation, or diarrhea.  No headaches or vision changes. He is here today for evaluation  before before starting cycle #12     MEDICAL HISTORY: Past Medical History:  Diagnosis Date   CHF (congestive heart failure) (HCC)    Coronary artery disease    Diabetes mellitus without complication (HCC)    History of radiation therapy    Right lung- 11/27/21-01/10/22- Dr. Antony Blackbird   Hypertension    Hypothyroidism    Ischemic cardiomyopathy    Pneumonia    a long time ago   Sleep  apnea    no longer uses a cpap    ALLERGIES:  has No Known Allergies.  MEDICATIONS:  Current Outpatient Medications  Medication Sig Dispense Refill   ammonium lactate (LAC-HYDRIN) 12 % lotion Apply 1 Application topically in the morning and at bedtime.     aspirin 81 MG chewable tablet Take 81 mg by mouth daily.     atorvastatin (LIPITOR) 40 MG tablet Take 40 mg by mouth daily.     benzonatate (TESSALON) 100 MG capsule Take 1 capsule (100 mg total) by mouth 3 (three) times daily as needed for cough. 30 capsule 2   blood glucose meter kit and supplies KIT Dispense based on patient and insurance preference. Use up to four times daily as directed. 1 each 0   carvedilol (COREG) 25 MG tablet Take 25 mg by mouth at bedtime.     Insulin Glargine-aglr 100 UNIT/ML SOPN Inject into the skin.     levothyroxine (SYNTHROID) 100 MCG tablet Take 1 tablet (100 mcg total) by mouth daily before breakfast. 30 tablet 2   lidocaine-prilocaine (EMLA) cream Apply 1 application topically as needed. 30 g 2   Menthol-Methyl Salicylate (MUSCLE RUB) 10-15 % CREA Apply 1 application topically daily as needed for muscle pain.     metFORMIN (GLUCOPHAGE) 500 MG tablet Take 1 tablet (500 mg total) by mouth 2 (two) times daily with a meal. 60 tablet 3   Multiple Vitamins-Minerals (MULTIVITAMIN WITH MINERALS) tablet Take 1 tablet by mouth daily.     nitroGLYCERIN (NITROSTAT) 0.4  MG SL tablet Place 0.4 mg under the tongue every 5 (five) minutes as needed for chest pain.     prochlorperazine (COMPAZINE) 10 MG tablet Take 1 tablet (10 mg total) by mouth every 6 (six) hours as needed for nausea or vomiting. 30 tablet 0   urea (CARMOL) 20 % cream Apply 1 Application topically 2 (two) times daily.     No current facility-administered medications for this visit.    SURGICAL HISTORY:  Past Surgical History:  Procedure Laterality Date   BRONCHIAL BIOPSY  06/04/2021   Procedure: BRONCHIAL BIOPSIES;  Surgeon: Josephine Igo, DO;   Location: [facility];  Service: Pulmonary;;   BRONCHIAL BRUSHINGS  06/04/2021   Procedure: BRONCHIAL BRUSHINGS;  Surgeon: Josephine Igo, DO;  Location: [facility];  Service: Pulmonary;;   BRONCHIAL NEEDLE ASPIRATION BIOPSY  06/04/2021   Procedure: BRONCHIAL NEEDLE ASPIRATION BIOPSIES;  Surgeon: Josephine Igo, DO;  Location: [facility];  Service: Pulmonary;;   BRONCHIAL WASHINGS  06/04/2021   Procedure: BRONCHIAL WASHINGS;  Surgeon: Josephine Igo, DO;  Location: [facility];  Service: Pulmonary;;   CARDIAC SURGERY     COLONOSCOPY     CORONARY ARTERY BYPASS GRAFT     2019 at [facility]   VIDEO BRONCHOSCOPY WITH ENDOBRONCHIAL NAVIGATION Right 06/04/2021   Procedure: VIDEO BRONCHOSCOPY WITH ENDOBRONCHIAL NAVIGATION;  Surgeon: Josephine Igo, DO;  Location: [facility];  Service: Pulmonary;  Laterality: Right;   VIDEO BRONCHOSCOPY WITH ENDOBRONCHIAL ULTRASOUND  06/04/2021   Procedure: VIDEO BRONCHOSCOPY WITH ENDOBRONCHIAL ULTRASOUND;  Surgeon: Josephine Igo, DO;  Location: [facility];  Service: Pulmonary;;    REVIEW OF SYSTEMS:   Review of Systems  Constitutional: Negative for appetite change, chills, fatigue, fever and unexpected weight change.  HENT: Negative for mouth sores, nosebleeds, sore throat and trouble swallowing.   Eyes: Negative for eye problems and icterus.  Respiratory: Positive for dyspnea on exertion and cough.  Negative for hemoptysis and wheezing.   Cardiovascular: Negative for chest pain and leg swelling.  Gastrointestinal: Negative for abdominal pain, constipation, diarrhea, nausea and vomiting.  Genitourinary: Negative for bladder incontinence, difficulty urinating, dysuria, frequency and hematuria.   Musculoskeletal: Negative for back pain, gait problem, neck pain and neck stiffness.  Skin: Positive for dry flaky skin on lower extremities. Neurological: Negative for dizziness, extremity weakness, gait problem, headaches, light-headedness and seizures.   Hematological: Negative for adenopathy. Does not bruise/bleed easily.  Psychiatric/Behavioral: Negative for confusion, depression and sleep disturbance. The patient is not nervous/anxious.       PHYSICAL EXAMINATION:  Blood pressure (!) 140/74, pulse (!) 105, temperature 98 F (36.7 C), temperature source Oral, resp. rate 18, weight 246 lb 11.2 oz (111.9 kg), SpO2 99 %.  ECOG PERFORMANCE STATUS: 1  Physical Exam  Constitutional: Oriented to person, place, and time and well-developed, well-nourished, and in no distress.  HENT:  Head: Normocephalic and atraumatic.  Mouth/Throat: Oropharynx is clear and moist. No oropharyngeal exudate.  Eyes: Conjunctivae are normal. Right eye exhibits no discharge. Left eye exhibits no discharge. No scleral icterus.  Neck: Normal range of motion. Neck supple.  Cardiovascular: Normal rate, regular rhythm, normal heart sounds and intact distal pulses.   Pulmonary/Chest: Effort normal. No respiratory distress. No wheezes. No rales.  Abdominal: Soft. Bowel sounds are normal. Exhibits no distension and no mass. There is no tenderness.  Musculoskeletal: Normal range of motion. Exhibits no edema.  Lymphadenopathy:    No cervical adenopathy.  Neurological: Alert and oriented to person, place, and  time. Exhibits normal muscle tone. Gait normal. Coordination normal.  Skin: Skin is warm and dry. Dry skin on lower extremities. Not diaphoretic. No erythema. No pallor.  Psychiatric: Mood, memory and judgment normal.  Vitals reviewed  LABORATORY DATA: Lab Results  Component Value Date   WBC 7.0 01/14/2023   HGB 13.0 01/14/2023   HCT 39.4 01/14/2023   MCV 95.2 01/14/2023   PLT 278 01/14/2023      Chemistry      Component Value Date/Time   NA 140 01/14/2023 1043   K 4.0 01/14/2023 1043   CL 105 01/14/2023 1043   CO2 28 01/14/2023 1043   BUN 16 01/14/2023 1043   CREATININE 1.60 (H) 01/14/2023 1043      Component Value Date/Time   CALCIUM 9.1  01/14/2023 1043   ALKPHOS 113 01/14/2023 1043   AST 14 (L) 01/14/2023 1043   ALT 7 01/14/2023 1043   BILITOT 0.3 01/14/2023 1043       RADIOGRAPHIC STUDIES:  CT Chest Wo Contrast  Result Date: 12/18/2022 CLINICAL DATA:  Non-small cell lung cancer, assess treatment response * Tracking Code: BO * EXAM: CT CHEST WITHOUT CONTRAST TECHNIQUE: Multidetector CT imaging of the chest was performed following the standard protocol without IV contrast. RADIATION DOSE REDUCTION: This exam was performed according to the departmental dose-optimization program which includes automated exposure control, adjustment of the mA and/or kV according to patient size and/or use of iterative reconstruction technique. COMPARISON:  09/22/2022 FINDINGS: Cardiovascular: Aortic atherosclerosis. Normal heart size. Three-vessel coronary artery calcifications status post median sternotomy and CABG. No pericardial effusion. Mediastinum/Nodes: Unchanged prominent pretracheal lymph nodes. Thyroid gland, trachea, and esophagus demonstrate no significant findings. Lungs/Pleura: Small, loculated right pleural effusion, slightly diminished compared to prior examination. Unchanged post treatment/post radiation appearance of the perihilar and infrahilar right lung with dense consolidation and fibrosis. Additional fibrotic scarring of the infrahilar left lower lobe (series 5, image 110)., underlying pulmonary fibrosis irregular peripheral interstitial opacity and septal thickening. Upper Abdomen: No acute abnormality. Musculoskeletal: No chest wall abnormality. No acute osseous findings. IMPRESSION: 1. Unchanged post treatment/post radiation appearance of the perihilar and infrahilar right lung with dense consolidation and fibrosis. Additional fibrotic scarring of the infrahilar left lower lobe. 2. Small, loculated right pleural effusion, slightly diminished compared to prior examination. 3. Mild underlying pulmonary fibrosis featuring irregular  peripheral interstitial opacity and septal thickening. 4. Unchanged prominent pretracheal lymph nodes, likely reactive. Attention on follow-up. 5. Coronary artery disease. Aortic Atherosclerosis (ICD10-I70.0). Electronically Signed   By: Delanna Ahmadi M.D.   On: 12/18/2022 10:13     ASSESSMENT/PLAN:  This is a very pleasant 77 year old African-American male diagnosed with stage IIIb (T3b, N2, M0) non-small cell lung cancer, adenocarcinoma.  He presented with a large right upper lobe lung mass and suspicious right lower lobe paratracheal lymphadenopathy.  He was diagnosed in June 2022.  His molecular studies by guardant 360 were negative for any actionable mutations.    The patient completed 3 cycles of neoadjuvant chemotherapy according to the Checkmate 816 trial with carboplatin for an AUC of 5, Alimta 500 mg per metered square, and nivolumab 360 mg IV every 3 weeks.  The patient status post 3 cycles.  His last dose was on 09/24/2021.  Upon reimaging after completion of this course, there was not significant improvement in the size of his mass and therefore, it does not appear that we are able to downstage his disease to make him eligible for surgical resection.   He completed a  course of concurrent chemoradiation with weekly carboplatin for AUC of 2 and paclitaxel 45 Mg/M2 status post 7 cycles.  Last cycle was given January 02, 2022.     The patient is currently on consolidation treatment with immunotherapy with Imfinzi 1500 Mg IV every 4 weeks status post 11 cycles.    We will see him back for follow-up visit in 4 weeks for evaluation repeat blood work.   Labs were reviewed.  The patient has baseline CKD with a creatinine of 1.60. Recommend he proceed with cycle 12 today scheduled.  When I see him on 02/11/23, I discussed I would discuss in more depth the next steps in his care with timing of next restaging CT scan.   The patient was advised to call immediately if he has any concerning symptoms in  the interval. The patient voices understanding of current disease status and treatment options and is in agreement with the current care plan. All questions were answered. The patient knows to call the clinic with any problems, questions or concerns. We can certainly see the patient much sooner if necessary      Orders Placed This Encounter  Procedures   CBC with Differential (Cancer Center Only)    Standing Status:   Future    Standing Expiration Date:   01/15/2024   CMP (Cancer Center only)    Standing Status:   Future    Standing Expiration Date:   01/15/2024   CBC with Differential (Cancer Center Only)    Standing Status:   Future    Standing Expiration Date:   02/12/2024   CMP (Cancer Center only)    Standing Status:   Future    Standing Expiration Date:   02/12/2024   TSH    Standing Status:   Future    Standing Expiration Date:   02/12/2024   CBC with Differential (Cancer Center Only)    Standing Status:   Future    Standing Expiration Date:   03/11/2024   CMP (Cancer Center only)    Standing Status:   Future    Standing Expiration Date:   03/11/2024   TSH    Standing Status:   Future    Standing Expiration Date:   03/11/2024     The total time spent in the appointment was 20-29 minutes  Lorree Millar L Sherlyn Ebbert, PA-C 01/14/23

## 2023-01-14 ENCOUNTER — Other Ambulatory Visit: Payer: Self-pay | Admitting: Physician Assistant

## 2023-01-14 ENCOUNTER — Inpatient Hospital Stay: Payer: No Typology Code available for payment source

## 2023-01-14 ENCOUNTER — Other Ambulatory Visit: Payer: Self-pay

## 2023-01-14 ENCOUNTER — Inpatient Hospital Stay (HOSPITAL_BASED_OUTPATIENT_CLINIC_OR_DEPARTMENT_OTHER): Payer: No Typology Code available for payment source | Admitting: Physician Assistant

## 2023-01-14 ENCOUNTER — Inpatient Hospital Stay: Payer: No Typology Code available for payment source | Attending: Internal Medicine

## 2023-01-14 VITALS — BP 140/74 | HR 105 | Temp 98.0°F | Resp 18 | Wt 246.7 lb

## 2023-01-14 DIAGNOSIS — I509 Heart failure, unspecified: Secondary | ICD-10-CM | POA: Diagnosis not present

## 2023-01-14 DIAGNOSIS — I130 Hypertensive heart and chronic kidney disease with heart failure and stage 1 through stage 4 chronic kidney disease, or unspecified chronic kidney disease: Secondary | ICD-10-CM | POA: Insufficient documentation

## 2023-01-14 DIAGNOSIS — C3431 Malignant neoplasm of lower lobe, right bronchus or lung: Secondary | ICD-10-CM | POA: Diagnosis present

## 2023-01-14 DIAGNOSIS — Z79899 Other long term (current) drug therapy: Secondary | ICD-10-CM | POA: Diagnosis not present

## 2023-01-14 DIAGNOSIS — Z5112 Encounter for antineoplastic immunotherapy: Secondary | ICD-10-CM

## 2023-01-14 DIAGNOSIS — E1122 Type 2 diabetes mellitus with diabetic chronic kidney disease: Secondary | ICD-10-CM | POA: Insufficient documentation

## 2023-01-14 DIAGNOSIS — E039 Hypothyroidism, unspecified: Secondary | ICD-10-CM

## 2023-01-14 DIAGNOSIS — N189 Chronic kidney disease, unspecified: Secondary | ICD-10-CM | POA: Diagnosis not present

## 2023-01-14 DIAGNOSIS — C3491 Malignant neoplasm of unspecified part of right bronchus or lung: Secondary | ICD-10-CM

## 2023-01-14 DIAGNOSIS — F17210 Nicotine dependence, cigarettes, uncomplicated: Secondary | ICD-10-CM | POA: Diagnosis not present

## 2023-01-14 LAB — CBC WITH DIFFERENTIAL (CANCER CENTER ONLY)
Abs Immature Granulocytes: 0.02 10*3/uL (ref 0.00–0.07)
Basophils Absolute: 0.1 10*3/uL (ref 0.0–0.1)
Basophils Relative: 1 %
Eosinophils Absolute: 0.3 10*3/uL (ref 0.0–0.5)
Eosinophils Relative: 4 %
HCT: 39.4 % (ref 39.0–52.0)
Hemoglobin: 13 g/dL (ref 13.0–17.0)
Immature Granulocytes: 0 %
Lymphocytes Relative: 20 %
Lymphs Abs: 1.4 10*3/uL (ref 0.7–4.0)
MCH: 31.4 pg (ref 26.0–34.0)
MCHC: 33 g/dL (ref 30.0–36.0)
MCV: 95.2 fL (ref 80.0–100.0)
Monocytes Absolute: 0.6 10*3/uL (ref 0.1–1.0)
Monocytes Relative: 9 %
Neutro Abs: 4.6 10*3/uL (ref 1.7–7.7)
Neutrophils Relative %: 66 %
Platelet Count: 278 10*3/uL (ref 150–400)
RBC: 4.14 MIL/uL — ABNORMAL LOW (ref 4.22–5.81)
RDW: 15 % (ref 11.5–15.5)
WBC Count: 7 10*3/uL (ref 4.0–10.5)
nRBC: 0 % (ref 0.0–0.2)

## 2023-01-14 LAB — CMP (CANCER CENTER ONLY)
ALT: 7 U/L (ref 0–44)
AST: 14 U/L — ABNORMAL LOW (ref 15–41)
Albumin: 3.6 g/dL (ref 3.5–5.0)
Alkaline Phosphatase: 113 U/L (ref 38–126)
Anion gap: 7 (ref 5–15)
BUN: 16 mg/dL (ref 8–23)
CO2: 28 mmol/L (ref 22–32)
Calcium: 9.1 mg/dL (ref 8.9–10.3)
Chloride: 105 mmol/L (ref 98–111)
Creatinine: 1.6 mg/dL — ABNORMAL HIGH (ref 0.61–1.24)
GFR, Estimated: 44 mL/min — ABNORMAL LOW (ref >60)
Glucose, Bld: 222 mg/dL — ABNORMAL HIGH (ref 70–99)
Potassium: 4 mmol/L (ref 3.5–5.1)
Sodium: 140 mmol/L (ref 135–145)
Total Bilirubin: 0.3 mg/dL (ref 0.3–1.2)
Total Protein: 6.9 g/dL (ref 6.5–8.1)

## 2023-01-14 LAB — TSH: TSH: 33.465 u[IU]/mL — ABNORMAL HIGH (ref 0.350–4.500)

## 2023-01-14 MED ORDER — SODIUM CHLORIDE 0.9 % IV SOLN: Freq: Once | INTRAVENOUS | Status: AC

## 2023-01-14 MED ORDER — SODIUM CHLORIDE 0.9% FLUSH: 10.0000 mL | INTRAVENOUS | Status: DC | PRN

## 2023-01-14 MED ORDER — SODIUM CHLORIDE 0.9 % IV SOLN
1500.0000 mg | Freq: Once | INTRAVENOUS | Status: AC
  Administered 2023-01-14: 1500 mg via INTRAVENOUS
  Filled 2023-01-14: qty 30

## 2023-01-14 MED ORDER — HEPARIN SOD (PORK) LOCK FLUSH 100 UNIT/ML IV SOLN: 500.0000 [IU] | Freq: Once | INTRAVENOUS | Status: DC | PRN

## 2023-01-14 NOTE — Progress Notes (Signed)
Per [name], it is ok to treat pt today with Imfinzi and pulse of 105.

## 2023-01-14 NOTE — Patient Instructions (Signed)
[facility]  Discharge Instructions: Thank you for choosing [facility] to provide your oncology and hematology care.   If you have a lab appointment with the Cancer Center, please go directly to the Cancer Center and check in at the registration area.   Wear comfortable clothing and clothing appropriate for easy access to any Portacath or PICC line.   We strive to give you quality time with your provider. You may need to reschedule your appointment if you arrive late (15 or more minutes).  Arriving late affects you and other patients whose appointments are after yours.  Also, if you miss three or more appointments without notifying the office, you may be dismissed from the clinic at the provider's discretion.      For prescription refill requests, have your pharmacy contact our office and allow 72 hours for refills to be completed.    Today you received the following chemotherapy and/or immunotherapy agents: durvalumab      To help prevent nausea and vomiting after your treatment, we encourage you to take your nausea medication as directed.  BELOW ARE SYMPTOMS THAT SHOULD BE REPORTED IMMEDIATELY: *FEVER GREATER THAN 100.4 F (38 C) OR HIGHER *CHILLS OR SWEATING *NAUSEA AND VOMITING THAT IS NOT CONTROLLED WITH YOUR NAUSEA MEDICATION *UNUSUAL SHORTNESS OF BREATH *UNUSUAL BRUISING OR BLEEDING *URINARY PROBLEMS (pain or burning when urinating, or frequent urination) *BOWEL PROBLEMS (unusual diarrhea, constipation, pain near the anus) TENDERNESS IN MOUTH AND THROAT WITH OR WITHOUT PRESENCE OF ULCERS (sore throat, sores in mouth, or a toothache) UNUSUAL RASH, SWELLING OR PAIN  UNUSUAL VAGINAL DISCHARGE OR ITCHING   Items with * indicate a potential emergency and should be followed up as soon as possible or go to the Emergency Department if any problems should occur.  Please show the CHEMOTHERAPY ALERT CARD or IMMUNOTHERAPY ALERT CARD at check-in to  the Emergency Department and triage nurse.  Should you have questions after your visit or need to cancel or reschedule your appointment, please contact [facility]  Dept: [phone]  and follow the prompts.  Office hours are 8:00 a.m. to 4:30 p.m. Monday - Friday. Please note that voicemails left after 4:00 p.m. may not be returned until the following business day.  We are closed weekends and major holidays. You have access to a nurse at all times for urgent questions. Please call the main number to the clinic Dept: [phone] and follow the prompts.   For any non-urgent questions, you may also contact your provider using MyChart. We now offer e-Visits for anyone 59 and older to request care online for non-urgent symptoms. For details visit [email or web address].   Also download the MyChart app! Go to the app store, search "MyChart", open the app, select [place], and log in with your MyChart username and password.

## 2023-01-15 ENCOUNTER — Telehealth: Payer: Self-pay | Admitting: Physician Assistant

## 2023-01-15 NOTE — Telephone Encounter (Signed)
The patient's TSH was elevated on labs yesterday.  It looks like based on when his last refill was of his synthroid, that the patient has not been taking his medication. I called him. Confirmed he has not been taking this. Therefore, he will resume taking this today. He has enough at home and does not need a refill at this time.

## 2023-01-16 ENCOUNTER — Other Ambulatory Visit: Payer: Self-pay

## 2023-01-29 ENCOUNTER — Telehealth: Payer: Self-pay | Admitting: Medical Oncology

## 2023-01-29 NOTE — Telephone Encounter (Signed)
SOB/w stairs-Paul [name] reports SOB after walking up 13 stairs to his apt. He said it is no worse since  he saw [name]. He denies fever, chest pain or other symptoms.   I told him to call if he develops worsening sob , chest pain, fever .

## 2023-02-07 NOTE — Progress Notes (Unsigned)
[place] [address]  DIAGNOSIS: Stage IIIB (T3b, N2, M0) non-small cell lung cancer, adenocarcinoma presented with large right lower lobe lung mass and suspicious right lower paratracheal lymphadenopathy diagnosed in June 2022.   Molecular studies by [employer] showed no actionable mutations   PRIOR THERAPY: 1) Neoadjuvant systemic chemotherapy according to the Checkmate 816 with carboplatin for AUC of 5, Alimta 500 Mg/M2 and nivolumab 360 Mg IV every 3 weeks for 3 cycles.  Last dose 09/24/21.  Status post 3 cycles 2) 2) Concurrent chemoradiation with carboplatin for an AUC of 2 and paclitaxel 45 mg per metered square.  First dose expected on 11/04/2021.  Status post 7 cycles.  CURRENT THERAPY: Consolidation treatment with immunotherapy with Imfinzi 1500 Mg IV every 4 weeks.  First dose February 12, 2022.  Status post 12 cycles.    INTERVAL HISTORY: Paul [name] 77 y.o. male returns to the clinic today for a follow-up visit.  The patient was last seen 1 month ago.  In the interval since last being seen the patient was admitted to the hospital for CHF exacerbation and pneumonia.  He was treated with IV antibiotics and diuretics.  He was discharged on Omnicef and torsemide. He does not have a follow up with his cardiologist or PCP per discharge summary.  Since being discharged his breathing is better. He mentions he had gained 5 lbs since last being seen prior to his hospital visit and had swelling. He has since lost weight since diuresis in the hospital.   Otherwise, he denies any fever, chills, or night sweats. He continues to smoke cigarettes. He still has a mild cough but it is better since being in the hospital. His shortness of breath is back to his baseline dyspnea on exertion. Denies any chest pain or hemoptysis.  Denies any nausea, vomiting, diarrhea, or constipation.  Denies any  headache or visual changes.  Denies any rashes or skin changes except for the dry skin on the lower extremities.  He is here today for evaluation and repeat blood work before going cycle #12.  MEDICAL HISTORY: Past Medical History:  Diagnosis Date   CHF (congestive heart failure) ([place])    Coronary artery disease    Diabetes mellitus without complication ([place])    History of radiation therapy    Right lung- 11/27/21-01/10/22- Dr. [name]   Hypertension    Hypothyroidism    Ischemic cardiomyopathy    Pneumonia    a long time ago   Sleep apnea    no longer uses a cpap    ALLERGIES:  has No Known Allergies.  MEDICATIONS:  Current Outpatient Medications  Medication Sig Dispense Refill   ammonium lactate (LAC-HYDRIN) 12 % lotion Apply 1 Application topically in the morning and at bedtime.     aspirin 81 MG chewable tablet Take 81 mg by mouth daily.     atorvastatin (LIPITOR) 40 MG tablet Take 40 mg by mouth daily.     benzonatate (TESSALON) 100 MG capsule Take 1 capsule (100 mg total) by mouth 3 (three) times daily as needed for cough. 30 capsule 2   blood glucose meter kit and supplies KIT Dispense based on patient and insurance preference. Use up to four times daily as directed. 1 each 0   carvedilol (COREG) 25 MG tablet Take 25 mg by mouth at bedtime.     Insulin Glargine-aglr 100 UNIT/ML SOPN Inject into the skin.  levothyroxine (SYNTHROID) 100 MCG tablet Take 1 tablet (100 mcg total) by mouth daily before breakfast. 30 tablet 2   lidocaine-prilocaine (EMLA) cream Apply 1 application topically as needed. 30 g 2   Menthol-Methyl Salicylate (MUSCLE RUB) 10-15 % CREA Apply 1 application topically daily as needed for muscle pain.     metFORMIN (GLUCOPHAGE) 500 MG tablet Take 1 tablet (500 mg total) by mouth 2 (two) times daily with a meal. 60 tablet 3   Multiple Vitamins-Minerals (MULTIVITAMIN WITH MINERALS) tablet Take 1 tablet by mouth daily.     nitroGLYCERIN (NITROSTAT) 0.4  MG SL tablet Place 0.4 mg under the tongue every 5 (five) minutes as needed for chest pain.     prochlorperazine (COMPAZINE) 10 MG tablet Take 1 tablet (10 mg total) by mouth every 6 (six) hours as needed for nausea or vomiting. 30 tablet 0   urea (CARMOL) 20 % cream Apply 1 Application topically 2 (two) times daily.     No current facility-administered medications for this visit.    SURGICAL HISTORY:  Past Surgical History:  Procedure Laterality Date   BRONCHIAL BIOPSY  06/04/2021   Procedure: BRONCHIAL BIOPSIES;  Surgeon: Garner Nash, DO;  Location: [facility];  Service: Pulmonary;;   BRONCHIAL BRUSHINGS  06/04/2021   Procedure: BRONCHIAL BRUSHINGS;  Surgeon: Garner Nash, DO;  Location: [facility];  Service: Pulmonary;;   BRONCHIAL NEEDLE ASPIRATION BIOPSY  06/04/2021   Procedure: BRONCHIAL NEEDLE ASPIRATION BIOPSIES;  Surgeon: Garner Nash, DO;  Location: [facility];  Service: Pulmonary;;   BRONCHIAL WASHINGS  06/04/2021   Procedure: BRONCHIAL WASHINGS;  Surgeon: Garner Nash, DO;  Location: [facility];  Service: Pulmonary;;   CARDIAC SURGERY     COLONOSCOPY     CORONARY ARTERY BYPASS GRAFT     2019 at [facility] Right 06/04/2021   Procedure: [place];  Surgeon: Garner Nash, DO;  Location: [facility];  Service: Pulmonary;  Laterality: Right;   VIDEO BRONCHOSCOPY WITH ENDOBRONCHIAL ULTRASOUND  06/04/2021   Procedure: VIDEO BRONCHOSCOPY WITH ENDOBRONCHIAL ULTRASOUND;  Surgeon: Garner Nash, DO;  Location: [facility];  Service: Pulmonary;;    REVIEW OF SYSTEMS:   Review of Systems  Constitutional: Positive for baseline fatigue. Negative for appetite change, chills, fever and unexpected weight change.  HENT: Negative for mouth sores, nosebleeds, sore throat and trouble swallowing.  Eyes: Negative for eye problems and icterus.  Respiratory: Positive for improved  dyspnea on exertion and cough.  Negative for hemoptysis and wheezing.   Cardiovascular: Negative for chest pain and leg swelling.  Gastrointestinal: Negative for abdominal pain, constipation, diarrhea, nausea and vomiting.  Genitourinary: Negative for bladder incontinence, difficulty urinating, dysuria, frequency and hematuria.   Musculoskeletal: Negative for back pain, gait problem, neck pain and neck stiffness.  Skin: Positive for dry flaky skin on lower extremities. Neurological: Negative for dizziness, extremity weakness, gait problem, headaches, light-headedness and seizures.  Hematological: Negative for adenopathy. Does not bruise/bleed easily.  Psychiatric/Behavioral: Negative for confusion, depression and sleep disturbance. The patient is not nervous/anxious.     PHYSICAL EXAMINATION:  Blood pressure (!) 142/89, pulse 88, temperature 97.6 F (36.4 C), temperature source Oral, resp. rate 18, weight 244 lb 11.2 oz (111 kg), SpO2 97 %.  ECOG PERFORMANCE STATUS: 1  Physical Exam  Constitutional: Oriented to person, place, and time and well-developed, well-nourished, and in no distress.  HENT:  Head: Normocephalic and atraumatic.  Mouth/Throat: Oropharynx is  clear and moist. No oropharyngeal exudate.  Eyes: Conjunctivae are normal. Right eye exhibits no discharge. Left eye exhibits no discharge. No scleral icterus.  Neck: Normal range of motion. Neck supple.  Cardiovascular: Normal rate, regular rhythm, normal heart sounds and intact distal pulses.   Pulmonary/Chest: Effort normal. No respiratory distress.  Mild wheezing bilaterally. No rales.  Abdominal: Soft. Bowel sounds are normal. Exhibits no distension and no mass. There is no tenderness.  Musculoskeletal: Normal range of motion. Exhibits no edema.  Lymphadenopathy:    No cervical adenopathy.  Neurological: Alert and oriented to person, place, and time. Exhibits normal muscle tone. Gait normal. Coordination normal.  Skin: Skin  is warm and dry. Dry skin on lower extremities. Not diaphoretic. No erythema. No pallor.  Psychiatric: Mood, memory and judgment normal.  Vitals reviewed  LABORATORY DATA: Lab Results  Component Value Date   WBC 8.2 02/11/2023   HGB 14.0 02/11/2023   HCT 42.2 02/11/2023   MCV 95.9 02/11/2023   PLT 303 02/11/2023      Chemistry      Component Value Date/Time   NA 140 01/14/2023 1043   K 4.0 01/14/2023 1043   CL 105 01/14/2023 1043   CO2 28 01/14/2023 1043   BUN 16 01/14/2023 1043   CREATININE 1.60 (H) 01/14/2023 1043      Component Value Date/Time   CALCIUM 9.1 01/14/2023 1043   ALKPHOS 113 01/14/2023 1043   AST 14 (L) 01/14/2023 1043   ALT 7 01/14/2023 1043   BILITOT 0.3 01/14/2023 1043       RADIOGRAPHIC STUDIES:  No results found.   ASSESSMENT/PLAN:  This is a very pleasant 77 year old African-American male diagnosed with stage IIIb (T3b, N2, M0) non-small cell lung cancer, adenocarcinoma.  He presented with a large right upper lobe lung mass and suspicious right lower lobe paratracheal lymphadenopathy.  He was diagnosed in June 2022.  His molecular studies by [employer] were negative for any actionable mutations.    The patient completed 3 cycles of neoadjuvant chemotherapy according to the Checkmate 816 trial with carboplatin for an AUC of 5, Alimta 500 mg per metered square, and nivolumab 360 mg IV every 3 weeks.  The patient status post 3 cycles.  His last dose was on 09/24/2021.  Upon reimaging after completion of this course, there was not significant improvement in the size of his mass and therefore, it does not appear that we are able to downstage his disease to make him eligible for surgical resection.   He completed a course of concurrent chemoradiation with weekly carboplatin for AUC of 2 and paclitaxel 45 Mg/M2 status post 7 cycles.  Last cycle was given January 02, 2022.     The patient is currently on consolidation treatment with immunotherapy with  Imfinzi 1500 Mg IV every 4 weeks status post 11 cycles.    Labs were reviewed. Labs show stable CKD with creatinine of 1.59. Ok to treat with creatinine. Recommend that he proceed with cycle #12 as scheduled.   We will see him back for follow-up visit in 4 weeks for evaluation repeat blood work.   I discussed the importance of hospital follow ups per discharge instructions. I encouraged him to call to make follow up with cardiologist and PCP. He was asking me about sodium. I discussed with the CHF exacerbation, that they typically would recommend low sodium diet and that high sodium content food may cause fluid retention such as soups or processed foods.   The patient  was advised to call immediately if he has any concerning symptoms in the interval. The patient voices understanding of current disease status and treatment options and is in agreement with the current care plan. All questions were answered. The patient knows to call the clinic with any problems, questions or concerns. We can certainly see the patient much sooner if necessary     No orders of the defined types were placed in this encounter.    The total time spent in the appointment was 20-29 minutes.   Paul [name] L Maizee Reinhold, PA-C 02/11/23

## 2023-02-09 ENCOUNTER — Other Ambulatory Visit: Payer: Self-pay

## 2023-02-11 ENCOUNTER — Inpatient Hospital Stay: Payer: No Typology Code available for payment source | Attending: Internal Medicine

## 2023-02-11 ENCOUNTER — Other Ambulatory Visit: Payer: Self-pay

## 2023-02-11 ENCOUNTER — Inpatient Hospital Stay (HOSPITAL_BASED_OUTPATIENT_CLINIC_OR_DEPARTMENT_OTHER): Payer: No Typology Code available for payment source | Admitting: Physician Assistant

## 2023-02-11 ENCOUNTER — Inpatient Hospital Stay: Payer: No Typology Code available for payment source

## 2023-02-11 VITALS — BP 142/89 | HR 88 | Temp 97.6°F | Resp 18 | Wt 244.7 lb

## 2023-02-11 DIAGNOSIS — C3431 Malignant neoplasm of lower lobe, right bronchus or lung: Secondary | ICD-10-CM | POA: Diagnosis present

## 2023-02-11 DIAGNOSIS — I509 Heart failure, unspecified: Secondary | ICD-10-CM | POA: Diagnosis not present

## 2023-02-11 DIAGNOSIS — C3491 Malignant neoplasm of unspecified part of right bronchus or lung: Secondary | ICD-10-CM | POA: Diagnosis not present

## 2023-02-11 DIAGNOSIS — Z5112 Encounter for antineoplastic immunotherapy: Secondary | ICD-10-CM | POA: Insufficient documentation

## 2023-02-11 DIAGNOSIS — I130 Hypertensive heart and chronic kidney disease with heart failure and stage 1 through stage 4 chronic kidney disease, or unspecified chronic kidney disease: Secondary | ICD-10-CM | POA: Diagnosis not present

## 2023-02-11 DIAGNOSIS — E1122 Type 2 diabetes mellitus with diabetic chronic kidney disease: Secondary | ICD-10-CM | POA: Diagnosis not present

## 2023-02-11 DIAGNOSIS — Z79899 Other long term (current) drug therapy: Secondary | ICD-10-CM | POA: Insufficient documentation

## 2023-02-11 DIAGNOSIS — F17210 Nicotine dependence, cigarettes, uncomplicated: Secondary | ICD-10-CM | POA: Diagnosis not present

## 2023-02-11 DIAGNOSIS — N189 Chronic kidney disease, unspecified: Secondary | ICD-10-CM | POA: Insufficient documentation

## 2023-02-11 LAB — CBC WITH DIFFERENTIAL (CANCER CENTER ONLY)
Abs Immature Granulocytes: 0.02 10*3/uL (ref 0.00–0.07)
Basophils Absolute: 0.1 10*3/uL (ref 0.0–0.1)
Basophils Relative: 2 %
Eosinophils Absolute: 0.3 10*3/uL (ref 0.0–0.5)
Eosinophils Relative: 4 %
HCT: 42.2 % (ref 39.0–52.0)
Hemoglobin: 14 g/dL (ref 13.0–17.0)
Immature Granulocytes: 0 %
Lymphocytes Relative: 21 %
Lymphs Abs: 1.7 10*3/uL (ref 0.7–4.0)
MCH: 31.8 pg (ref 26.0–34.0)
MCHC: 33.2 g/dL (ref 30.0–36.0)
MCV: 95.9 fL (ref 80.0–100.0)
Monocytes Absolute: 0.7 10*3/uL (ref 0.1–1.0)
Monocytes Relative: 9 %
Neutro Abs: 5.3 10*3/uL (ref 1.7–7.7)
Neutrophils Relative %: 64 %
Platelet Count: 303 10*3/uL (ref 150–400)
RBC: 4.4 MIL/uL (ref 4.22–5.81)
RDW: 14.9 % (ref 11.5–15.5)
WBC Count: 8.2 10*3/uL (ref 4.0–10.5)
nRBC: 0 % (ref 0.0–0.2)

## 2023-02-11 LAB — CMP (CANCER CENTER ONLY)
ALT: 21 U/L (ref 0–44)
AST: 25 U/L (ref 15–41)
Albumin: 4.3 g/dL (ref 3.5–5.0)
Alkaline Phosphatase: 128 U/L — ABNORMAL HIGH (ref 38–126)
Anion gap: 9 (ref 5–15)
BUN: 21 mg/dL (ref 8–23)
CO2: 25 mmol/L (ref 22–32)
Calcium: 9.1 mg/dL (ref 8.9–10.3)
Chloride: 108 mmol/L (ref 98–111)
Creatinine: 1.59 mg/dL — ABNORMAL HIGH (ref 0.61–1.24)
GFR, Estimated: 44 mL/min — ABNORMAL LOW (ref >60)
Glucose, Bld: 147 mg/dL — ABNORMAL HIGH (ref 70–99)
Potassium: 4.3 mmol/L (ref 3.5–5.1)
Sodium: 142 mmol/L (ref 135–145)
Total Bilirubin: 0.4 mg/dL (ref 0.3–1.2)
Total Protein: 8.7 g/dL — ABNORMAL HIGH (ref 6.5–8.1)

## 2023-02-11 LAB — TSH: TSH: 45.059 u[IU]/mL — ABNORMAL HIGH (ref 0.350–4.500)

## 2023-02-11 MED ORDER — SODIUM CHLORIDE 0.9 % IV SOLN
1500.0000 mg | Freq: Once | INTRAVENOUS | Status: AC
  Administered 2023-02-11: 1500 mg via INTRAVENOUS
  Filled 2023-02-11: qty 30

## 2023-02-11 MED ORDER — SODIUM CHLORIDE 0.9 % IV SOLN: Freq: Once | INTRAVENOUS | Status: AC

## 2023-02-11 NOTE — Patient Instructions (Signed)
[facility]  Discharge Instructions: Thank you for choosing [facility] to provide your oncology and hematology care.   If you have a lab appointment with the [facility], please go directly to the [facility] and check in at the registration area.   Wear comfortable clothing and clothing appropriate for easy access to any Portacath or PICC line.   We strive to give you quality time with your provider. You may need to reschedule your appointment if you arrive late (15 or more minutes).  Arriving late affects you and other patients whose appointments are after yours.  Also, if you miss three or more appointments without notifying the office, you may be dismissed from the clinic at the provider's discretion.      For prescription refill requests, have your pharmacy contact our office and allow 72 hours for refills to be completed.    Today you received the following chemotherapy and/or immunotherapy agents imfinzi      To help prevent nausea and vomiting after your treatment, we encourage you to take your nausea medication as directed.  BELOW ARE SYMPTOMS THAT SHOULD BE REPORTED IMMEDIATELY: *FEVER GREATER THAN 100.4 F (38 C) OR HIGHER *CHILLS OR SWEATING *NAUSEA AND VOMITING THAT IS NOT CONTROLLED WITH YOUR NAUSEA MEDICATION *UNUSUAL SHORTNESS OF BREATH *UNUSUAL BRUISING OR BLEEDING *URINARY PROBLEMS (pain or burning when urinating, or frequent urination) *BOWEL PROBLEMS (unusual diarrhea, constipation, pain near the anus) TENDERNESS IN MOUTH AND THROAT WITH OR WITHOUT PRESENCE OF ULCERS (sore throat, sores in mouth, or a toothache) UNUSUAL RASH, SWELLING OR PAIN  UNUSUAL VAGINAL DISCHARGE OR ITCHING   Items with * indicate a potential emergency and should be followed up as soon as possible or go to the Emergency Department if any problems should occur.  Please show the CHEMOTHERAPY ALERT CARD or IMMUNOTHERAPY ALERT CARD at check-in  to the Emergency Department and triage nurse.  Should you have questions after your visit or need to cancel or reschedule your appointment, please contact [facility]  Dept: [phone]  and follow the prompts.  Office hours are 8:00 a.m. to 4:30 p.m. Monday - Friday. Please note that voicemails left after 4:00 p.m. may not be returned until the following business day.  We are closed weekends and major holidays. You have access to a nurse at all times for urgent questions. Please call the main number to the clinic Dept: [phone] and follow the prompts.   For any non-urgent questions, you may also contact your provider using MyChart. We now offer e-Visits for anyone 36 and older to request care online for non-urgent symptoms. For details visit [email or web address].   Also download the MyChart app! Go to the app store, search "MyChart", open the app, select [facility], and log in with your MyChart username and password.

## 2023-02-12 ENCOUNTER — Other Ambulatory Visit: Payer: Self-pay | Admitting: Physician Assistant

## 2023-02-12 ENCOUNTER — Telehealth: Payer: Self-pay

## 2023-02-12 DIAGNOSIS — E039 Hypothyroidism, unspecified: Secondary | ICD-10-CM

## 2023-02-12 MED ORDER — LEVOTHYROXINE SODIUM 112 MCG PO TABS: 112.0000 ug | ORAL_TABLET | Freq: Every day | ORAL | 2 refills | Status: DC

## 2023-02-12 NOTE — Telephone Encounter (Signed)
This nurse reached out to this patient related to TSH levels.  Wanted to verify that patient is taking his Levothyroxine. If he is dose will need to be increased.  Left a message for patient to call back to the clinic.  No further concerns at this time.

## 2023-02-12 NOTE — Telephone Encounter (Signed)
This nurse spoke with this patient.  Verified that he is taking his Levothyroxine one tablet before breakfast daily.  This nurse advised patient that his thyroid levels are elevated and the provider is going to send in a higher dose of the medication for him.  Verified that patient wants the pills sent to [facility].  Patient acknowledged understanding of dose change.  No further questions or concerns at this time.

## 2023-03-09 NOTE — Progress Notes (Signed)
[facility] OFFICE PROGRESS NOTE  Clinic, Paul [name] [address] [facility] [place] [address]  DIAGNOSIS: Stage IIIB (T3b, N2, M0) non-small cell lung cancer, adenocarcinoma presented with large right lower lobe lung mass and suspicious right lower paratracheal lymphadenopathy diagnosed in June 2022.   Molecular studies by [employer] showed no actionable mutations  PRIOR THERAPY: 1) Neoadjuvant systemic chemotherapy according to the Checkmate 816 with carboplatin for AUC of 5, Alimta 500 Mg/M2 and nivolumab 360 Mg IV every 3 weeks for 3 cycles.  Last dose 09/24/21.  Status post 3 cycles 2) 2) Concurrent chemoradiation with carboplatin for an AUC of 2 and paclitaxel 45 mg per metered square.  First dose expected on 11/04/2021.  Status post 7 cycles.  CURRENT THERAPY: Consolidation treatment with immunotherapy with Imfinzi 1500 Mg IV every 4 weeks.  First dose February 12, 2022.  Status post 12 cycles.   INTERVAL HISTORY: Paul [name] 77 y.o. male returns to the clinic today for a follow-up visit.  The patient was last seen 1 month ago. At that time, he had been discharged from the hospital a days/weeks prior to that appointment for CHF exacerbation and pneumonia/COPD exacerbation. We had discussed the importance of following up with the [facility] cardiologist and PCP to ensure his diuretics can be managed and he can get the supplies for his COPD.  Since last being seen, the patient has not followed up with his providers per his discharge summary.  In fact, he has actually been feeling more short of breath and coughing more since last week.  His oxygen is 100%.  He is not on supplemental oxygen at home.  He has some audible wheezing.  The patient is not sure if he has any upcoming appointments with cardiology.    Had discussed he denies any fever, chills, or night sweats.  Denies any sick contacts, sore throat, or nasal congestion.  He continues to smoke cigarettes.   He states that this will be his last pack of cigarettes.  Denies any chest pain or hemoptysis.  Denies any nausea, vomiting, diarrhea, or constipation.  Denies any headache or visual changes.  Denies any rashes or skin changes except for the dry skin on the lower extremities.  Has some swelling but states it is better than it was in January.  Gained approximately 3 pounds since last being seen he is here today for evaluation and repeat blood work before going cycle #13     MEDICAL HISTORY: Past Medical History:  Diagnosis Date   CHF (congestive heart failure) (HCC)    Coronary artery disease    Diabetes mellitus without complication (HCC)    History of radiation therapy    Right lung- 11/27/21-01/10/22- Dr. Antony Blackbird   Hypertension    Hypothyroidism    Ischemic cardiomyopathy    Pneumonia    a long time ago   Sleep apnea    no longer uses a cpap    ALLERGIES:  has No Known Allergies.  MEDICATIONS:  Current Outpatient Medications  Medication Sig Dispense Refill   ammonium lactate (LAC-HYDRIN) 12 % lotion Apply 1 Application topically in the morning and at bedtime.     aspirin 81 MG chewable tablet Take 81 mg by mouth daily.     atorvastatin (LIPITOR) 40 MG tablet Take 40 mg by mouth daily.     benzonatate (TESSALON) 100 MG capsule Take 1 capsule (100 mg total) by mouth 3 (three) times daily as needed for cough. 30 capsule  2   blood glucose meter kit and supplies KIT Dispense based on patient and insurance preference. Use up to four times daily as directed. 1 each 0   carvedilol (COREG) 25 MG tablet Take 25 mg by mouth at bedtime.     Insulin Glargine-aglr 100 UNIT/ML SOPN Inject into the skin.     levothyroxine (SYNTHROID) 112 MCG tablet Take 1 tablet (112 mcg total) by mouth daily before breakfast. 30 tablet 2   lidocaine-prilocaine (EMLA) cream Apply 1 application topically as needed. 30 g 2   Menthol-Methyl Salicylate (MUSCLE RUB) 10-15 % CREA Apply 1 application topically  daily as needed for muscle pain.     metFORMIN (GLUCOPHAGE) 500 MG tablet Take 1 tablet (500 mg total) by mouth 2 (two) times daily with a meal. 60 tablet 3   Multiple Vitamins-Minerals (MULTIVITAMIN WITH MINERALS) tablet Take 1 tablet by mouth daily.     nitroGLYCERIN (NITROSTAT) 0.4 MG SL tablet Place 0.4 mg under the tongue every 5 (five) minutes as needed for chest pain.     prochlorperazine (COMPAZINE) 10 MG tablet Take 1 tablet (10 mg total) by mouth every 6 (six) hours as needed for nausea or vomiting. 30 tablet 0   urea (CARMOL) 20 % cream Apply 1 Application topically 2 (two) times daily.     No current facility-administered medications for this visit.    SURGICAL HISTORY:  Past Surgical History:  Procedure Laterality Date   BRONCHIAL BIOPSY  06/04/2021   Procedure: BRONCHIAL BIOPSIES;  Surgeon: Josephine Igo, DO;  Location: [facility];  Service: Pulmonary;;   BRONCHIAL BRUSHINGS  06/04/2021   Procedure: BRONCHIAL BRUSHINGS;  Surgeon: Josephine Igo, DO;  Location: [facility];  Service: Pulmonary;;   BRONCHIAL NEEDLE ASPIRATION BIOPSY  06/04/2021   Procedure: BRONCHIAL NEEDLE ASPIRATION BIOPSIES;  Surgeon: Josephine Igo, DO;  Location: [facility];  Service: Pulmonary;;   BRONCHIAL WASHINGS  06/04/2021   Procedure: BRONCHIAL WASHINGS;  Surgeon: Josephine Igo, DO;  Location: [facility];  Service: Pulmonary;;   CARDIAC SURGERY     COLONOSCOPY     CORONARY ARTERY BYPASS GRAFT     2019 at [facility]   VIDEO BRONCHOSCOPY WITH ENDOBRONCHIAL NAVIGATION Right 06/04/2021   Procedure: VIDEO BRONCHOSCOPY WITH ENDOBRONCHIAL NAVIGATION;  Surgeon: Josephine Igo, DO;  Location: [facility];  Service: Pulmonary;  Laterality: Right;   VIDEO BRONCHOSCOPY WITH ENDOBRONCHIAL ULTRASOUND  06/04/2021   Procedure: VIDEO BRONCHOSCOPY WITH ENDOBRONCHIAL ULTRASOUND;  Surgeon: Josephine Igo, DO;  Location: [facility];  Service: Pulmonary;;    REVIEW OF SYSTEMS:   Review of Systems   Constitutional: Positive for baseline fatigue.  Negative for appetite change, chills, fatigue, fever and unexpected weight change.  HENT:   Negative for mouth sores, nosebleeds, sore throat and trouble swallowing.   Eyes: Negative for eye problems and icterus.  Respiratory: Positive for increased cough and dyspnea on exertion.  Negative for cough, hemoptysis, shortness of breath and wheezing.   Cardiovascular: Negative for chest pain.  Positive for bilateral leg swelling.  Denies any calf pain or erythema. Gastrointestinal: Negative for abdominal pain, constipation, diarrhea, nausea and vomiting.  Genitourinary: Negative for bladder incontinence, difficulty urinating, dysuria, frequency and hematuria.   Musculoskeletal: Negative for back pain, gait problem, neck pain and neck stiffness.  Skin: Negative for itching and rash.  Neurological: Negative for dizziness, extremity weakness, gait problem, headaches, light-headedness and seizures.  Hematological: Negative for adenopathy. Does not bruise/bleed easily.  Psychiatric/Behavioral: Negative for confusion, depression and  sleep disturbance. The patient is not nervous/anxious.     PHYSICAL EXAMINATION:  Blood pressure 139/84, pulse 100, temperature 97.8 F (36.6 C), temperature source Oral, resp. rate 18, weight 247 lb 1.6 oz (112.1 kg), SpO2 100 %.  ECOG PERFORMANCE STATUS: 1  Physical Exam  Constitutional: Oriented to person, place, and time and well-developed, well-nourished, and in no distress. HENT:  Head: Normocephalic and atraumatic.  Mouth/Throat: Oropharynx is clear and moist. No oropharyngeal exudate.  Eyes: Conjunctivae are normal. Right eye exhibits no discharge. Left eye exhibits no discharge. No scleral icterus.  Neck: Normal range of motion. Neck supple.  Cardiovascular: Normal rate, regular rhythm, normal heart sounds and intact distal pulses.   Pulmonary/Chest: Effort normal and breath sounds normal. No respiratory  distress.  Positive for audible wheezing.  Abdominal: Soft. Bowel sounds are normal. Exhibits no distension and no mass. There is no tenderness.  Musculoskeletal: Normal range of motion. Exhibits no edema.  Lymphadenopathy:    No cervical adenopathy.  Neurological: Alert and oriented to person, place, and time. Exhibits normal muscle tone. Gait normal. Coordination normal.  Skin: Skin is warm and dry. No rash noted. Not diaphoretic. No erythema. No pallor.  Psychiatric: Mood, memory and judgment normal.  Vitals reviewed.  LABORATORY DATA: Lab Results  Component Value Date   WBC 7.9 03/12/2023   HGB 13.0 03/12/2023   HCT 38.7 (L) 03/12/2023   MCV 94.6 03/12/2023   PLT 252 03/12/2023      Chemistry      Component Value Date/Time   NA 143 03/12/2023 0930   K 4.3 03/12/2023 0930   CL 109 03/12/2023 0930   CO2 27 03/12/2023 0930   BUN 12 03/12/2023 0930   CREATININE 1.49 (H) 03/12/2023 0930      Component Value Date/Time   CALCIUM 9.4 03/12/2023 0930   ALKPHOS 107 03/12/2023 0930   AST 15 03/12/2023 0930   ALT 8 03/12/2023 0930   BILITOT 0.5 03/12/2023 0930       RADIOGRAPHIC STUDIES:  No results found.   ASSESSMENT/PLAN:  This is a very pleasant 77 year old African-American male diagnosed with stage IIIb (T3b, N2, M0) non-small cell lung cancer, adenocarcinoma.  He presented with a large right upper lobe lung mass and suspicious right lower lobe paratracheal lymphadenopathy.  He was diagnosed in June 2022.  His molecular studies by guardant 360 were negative for any actionable mutations.    The patient completed 3 cycles of neoadjuvant chemotherapy according to the Checkmate 816 trial with carboplatin for an AUC of 5, Alimta 500 mg per metered square, and nivolumab 360 mg IV every 3 weeks.  The patient status post 3 cycles.  His last dose was on 09/24/2021.  Upon reimaging after completion of this course, there was not significant improvement in the size of his mass and  therefore, it does not appear that we are able to downstage his disease to make him eligible for surgical resection.   He completed a course of concurrent chemoradiation with weekly carboplatin for AUC of 2 and paclitaxel 45 Mg/M2 status post 7 cycles.  Last cycle was given January 02, 2022.     The patient is currently on consolidation treatment with immunotherapy with Imfinzi 1500 Mg IV every 4 weeks status post 12 cycles.    Patient was seen with Dr. Arbutus Ped today.  This is the last day of his immunotherapy with Imfinzi.  He will proceed with cycle #13 today as scheduled.  However after his infusion, he will  go to the x-ray department to assess his shortness of breath.  I will also arrange for restaging CT scan of the chest in the next 2 weeks or so to check on his treatment response.  I reviewed the patient's pulmonary medicine notes from 2022 through [facility].  The patient is a [place] patient and at that time he was sent back to the [place] system to follow-up with pulmonary medicine.  We performed a 6-minute walking test today to see if the patient qualified for supplemental oxygen.  The patient had to take several breaks and rest but his oxygen stayed above 92%.  [facility] can [facility] him a short interval follow-up with his PCP or pulmonologist and cardiologist in the next few days. He likely would benefit from a nebulizer machine which we will [facility] the [facility] if they can consider ordering for him. I do not see any inhalers on his medication list but he is not a good historian. If not, I will send a rescue inhaler until he can see the [place]. I did review emergency room precautions with the patient.  If he experiences worsening shortness of breath and cough before he can be evaluated outpatient, he was advised to call 911 and be seen in the emergency room.   Patient was strongly encouraged to quit smoking.    Labs were reviewed. Labs show stable CKD with creatinine of 1.49. Ok to treat with  creatinine. Recommend that he proceed with cycle #13 as scheduled.   Will see him back in approximately 2 to 3 weeks after his restaging CT scan to discuss the next steps in his care.   The patient was advised to call immediately if she has any concerning symptoms in the interval. The patient voices understanding of current disease status and treatment options and is in agreement with the current care plan. All questions were answered. The patient knows to call the clinic with any problems, questions or concerns. We can certainly see the patient much sooner if necessary       Orders Placed This Encounter  Procedures   DG Chest 2 View    Standing Status:   Future    Standing Expiration Date:   03/11/2024    Order Specific Question:   Reason for Exam (SYMPTOM  OR DIAGNOSIS REQUIRED)    Answer:   Worsening shortness of breath. Lung cancer but also current smoker COPD and CHF,    Order Specific Question:   Preferred imaging location?    Answer:   [facility]   CT Chest Wo Contrast    Standing Status:   Future    Standing Expiration Date:   03/11/2024    Order Specific Question:   Preferred imaging location?    Answer:   [facility]   CBC with Differential (Cancer Center Only)    Standing Status:   Future    Standing Expiration Date:   03/11/2024   CMP (Cancer Center only)    Standing Status:   Future    Standing Expiration Date:   03/11/2024      Johnette Abraham Giuliano Preece, PA-C 03/12/23  ADDENDUM: Hematology/Oncology Attending: I had a face to face encounter with the patient today.  I reviewed his record, lab and recommended his care plan.  This is a very pleasant 77 years old African-American male with a stage IIIb non-small cell lung cancer, adenocarcinoma diagnosed in June 2022 status post neoadjuvant systemic chemotherapy with carboplatin,  Alimta and nivolumab for 3 cycles with no improvement of his disease.  The patient was then treated with concurrent  chemoradiation with weekly carboplatin and paclitaxel for 7 cycles.  He tolerated this treatment well with partial response.  He is currently undergoing consolidation treatment with immunotherapy with Imfinzi 1500 Mg IV every 4 weeks status post 12 cycles.  He has been tolerating this treatment well with no concerning adverse effect except for the baseline shortness of breath secondary to COPD.  The patient also continues to smoke at regular basis. I recommended for the patient to proceed with cycle 13 which is the last cycle of his consolidation immunotherapy today as planned. For the shortness of breath will check chest x-ray and will refer the patient back to his primary care physician and pulmonologist at the [facility] facility in [place] for further evaluation and management. We will see the patient back for follow-up visit in 3-4 weeks with repeat CT scan of the chest for restaging of his disease. The patient was advised to call immediately if he has any concerning symptoms in the interval. The total time spent in the appointment was 30 minutes. Disclaimer: This note was dictated with voice recognition software. Similar sounding words can inadvertently be transcribed and may be missed upon review. Lajuana Matte, MD

## 2023-03-12 ENCOUNTER — Telehealth: Payer: Self-pay | Admitting: *Deleted

## 2023-03-12 ENCOUNTER — Inpatient Hospital Stay: Payer: No Typology Code available for payment source | Attending: Internal Medicine

## 2023-03-12 ENCOUNTER — Other Ambulatory Visit: Payer: Self-pay | Admitting: Physician Assistant

## 2023-03-12 ENCOUNTER — Ambulatory Visit (HOSPITAL_COMMUNITY)
Admission: RE | Admit: 2023-03-12 | Discharge: 2023-03-12 | Disposition: A | Discharge status: Home / Self Care | Payer: No Typology Code available for payment source | Source: Ambulatory Visit | Attending: Physician Assistant | Admitting: Physician Assistant

## 2023-03-12 ENCOUNTER — Inpatient Hospital Stay: Payer: No Typology Code available for payment source | Admitting: Physician Assistant

## 2023-03-12 ENCOUNTER — Inpatient Hospital Stay: Payer: No Typology Code available for payment source

## 2023-03-12 VITALS — BP 139/84 | HR 100 | Temp 97.8°F | Resp 18 | Wt 247.1 lb

## 2023-03-12 DIAGNOSIS — C3491 Malignant neoplasm of unspecified part of right bronchus or lung: Secondary | ICD-10-CM

## 2023-03-12 DIAGNOSIS — R0602 Shortness of breath: Secondary | ICD-10-CM | POA: Diagnosis present

## 2023-03-12 DIAGNOSIS — C3431 Malignant neoplasm of lower lobe, right bronchus or lung: Secondary | ICD-10-CM | POA: Diagnosis present

## 2023-03-12 DIAGNOSIS — I509 Heart failure, unspecified: Secondary | ICD-10-CM | POA: Diagnosis not present

## 2023-03-12 DIAGNOSIS — Z5112 Encounter for antineoplastic immunotherapy: Secondary | ICD-10-CM | POA: Diagnosis present

## 2023-03-12 DIAGNOSIS — F17210 Nicotine dependence, cigarettes, uncomplicated: Secondary | ICD-10-CM | POA: Diagnosis not present

## 2023-03-12 DIAGNOSIS — I130 Hypertensive heart and chronic kidney disease with heart failure and stage 1 through stage 4 chronic kidney disease, or unspecified chronic kidney disease: Secondary | ICD-10-CM | POA: Insufficient documentation

## 2023-03-12 DIAGNOSIS — N189 Chronic kidney disease, unspecified: Secondary | ICD-10-CM | POA: Insufficient documentation

## 2023-03-12 DIAGNOSIS — Z79899 Other long term (current) drug therapy: Secondary | ICD-10-CM | POA: Insufficient documentation

## 2023-03-12 LAB — CBC WITH DIFFERENTIAL (CANCER CENTER ONLY)
Abs Immature Granulocytes: 0.02 10*3/uL (ref 0.00–0.07)
Basophils Absolute: 0.1 10*3/uL (ref 0.0–0.1)
Basophils Relative: 1 %
Eosinophils Absolute: 0.3 10*3/uL (ref 0.0–0.5)
Eosinophils Relative: 4 %
HCT: 38.7 % — ABNORMAL LOW (ref 39.0–52.0)
Hemoglobin: 13 g/dL (ref 13.0–17.0)
Immature Granulocytes: 0 %
Lymphocytes Relative: 19 %
Lymphs Abs: 1.5 10*3/uL (ref 0.7–4.0)
MCH: 31.8 pg (ref 26.0–34.0)
MCHC: 33.6 g/dL (ref 30.0–36.0)
MCV: 94.6 fL (ref 80.0–100.0)
Monocytes Absolute: 0.8 10*3/uL (ref 0.1–1.0)
Monocytes Relative: 11 %
Neutro Abs: 5.1 10*3/uL (ref 1.7–7.7)
Neutrophils Relative %: 65 %
Platelet Count: 252 10*3/uL (ref 150–400)
RBC: 4.09 MIL/uL — ABNORMAL LOW (ref 4.22–5.81)
RDW: 15.3 % (ref 11.5–15.5)
WBC Count: 7.9 10*3/uL (ref 4.0–10.5)
nRBC: 0 % (ref 0.0–0.2)

## 2023-03-12 LAB — CMP (CANCER CENTER ONLY)
ALT: 8 U/L (ref 0–44)
AST: 15 U/L (ref 15–41)
Albumin: 4 g/dL (ref 3.5–5.0)
Alkaline Phosphatase: 107 U/L (ref 38–126)
Anion gap: 7 (ref 5–15)
BUN: 12 mg/dL (ref 8–23)
CO2: 27 mmol/L (ref 22–32)
Calcium: 9.4 mg/dL (ref 8.9–10.3)
Chloride: 109 mmol/L (ref 98–111)
Creatinine: 1.49 mg/dL — ABNORMAL HIGH (ref 0.61–1.24)
GFR, Estimated: 48 mL/min — ABNORMAL LOW (ref >60)
Glucose, Bld: 164 mg/dL — ABNORMAL HIGH (ref 70–99)
Potassium: 4.3 mmol/L (ref 3.5–5.1)
Sodium: 143 mmol/L (ref 135–145)
Total Bilirubin: 0.5 mg/dL (ref 0.3–1.2)
Total Protein: 7.8 g/dL (ref 6.5–8.1)

## 2023-03-12 LAB — TSH: TSH: 38.409 u[IU]/mL — ABNORMAL HIGH (ref 0.350–4.500)

## 2023-03-12 MED ORDER — SODIUM CHLORIDE 0.9 % IV SOLN: Freq: Once | INTRAVENOUS | Status: AC

## 2023-03-12 MED ORDER — ALBUTEROL SULFATE HFA 108 (90 BASE) MCG/ACT IN AERS: 2.0000 | INHALATION_SPRAY | Freq: Four times a day (QID) | RESPIRATORY_TRACT | 2 refills | Status: AC | PRN

## 2023-03-12 MED ORDER — SODIUM CHLORIDE 0.9 % IV SOLN
1500.0000 mg | Freq: Once | INTRAVENOUS | Status: AC
  Administered 2023-03-12: 1500 mg via INTRAVENOUS
  Filled 2023-03-12: qty 30

## 2023-03-12 NOTE — Progress Notes (Signed)
   SATURATION QUALIFICATIONS: (This note is used to comply with regulatory documentation for home oxygen)  Patient Saturations on Room Air at Rest = 95%  Patient Saturations on Room Air while Ambulating - 93%  Patient ambulated approximately 5 minutes on room air, O2 sat was never below 92%.  Patient had to sit & rest during his walk, O2 sat remained stable.

## 2023-03-12 NOTE — Telephone Encounter (Signed)
PC to patient, informed him his CXR does not show pneumonia, I have called the [facility] in [place] to schedule a F/U appointment with his PCP for next week, the [place] will be calling him.  Also informed patient a ventolin inhaler rx has been sent to [facility] for him to use for wheezing.  He is to go to ED if he has increasing SOB.  Patient verbalizes understanding.

## 2023-03-12 NOTE — Patient Instructions (Signed)
[facility]  Discharge Instructions: Thank you for choosing [facility] to provide your oncology and hematology care.   If you have a lab appointment with the Cancer Center, please go directly to the Cancer Center and check in at the registration area.   Wear comfortable clothing and clothing appropriate for easy access to any Portacath or PICC line.   We strive to give you quality time with your provider. You may need to reschedule your appointment if you arrive late (15 or more minutes).  Arriving late affects you and other patients whose appointments are after yours.  Also, if you miss three or more appointments without notifying the office, you may be dismissed from the clinic at the provider's discretion.      For prescription refill requests, have your pharmacy contact our office and allow 72 hours for refills to be completed.    Today you received the following chemotherapy and/or immunotherapy agents: Durvalumab      To help prevent nausea and vomiting after your treatment, we encourage you to take your nausea medication as directed.  BELOW ARE SYMPTOMS THAT SHOULD BE REPORTED IMMEDIATELY: *FEVER GREATER THAN 100.4 F (38 C) OR HIGHER *CHILLS OR SWEATING *NAUSEA AND VOMITING THAT IS NOT CONTROLLED WITH YOUR NAUSEA MEDICATION *UNUSUAL SHORTNESS OF BREATH *UNUSUAL BRUISING OR BLEEDING *URINARY PROBLEMS (pain or burning when urinating, or frequent urination) *BOWEL PROBLEMS (unusual diarrhea, constipation, pain near the anus) TENDERNESS IN MOUTH AND THROAT WITH OR WITHOUT PRESENCE OF ULCERS (sore throat, sores in mouth, or a toothache) UNUSUAL RASH, SWELLING OR PAIN  UNUSUAL VAGINAL DISCHARGE OR ITCHING   Items with * indicate a potential emergency and should be followed up as soon as possible or go to the Emergency Department if any problems should occur.  Please show the CHEMOTHERAPY ALERT CARD or IMMUNOTHERAPY ALERT CARD at  check-in to the Emergency Department and triage nurse.  Should you have questions after your visit or need to cancel or reschedule your appointment, please contact [facility]  Dept: [phone]  and follow the prompts.  Office hours are 8:00 a.m. to 4:30 p.m. Monday - Friday. Please note that voicemails left after 4:00 p.m. may not be returned until the following business day.  We are closed weekends and major holidays. You have access to a nurse at all times for urgent questions. Please call the main number to the clinic Dept: [phone] and follow the prompts.   For any non-urgent questions, you may also contact your provider using MyChart. We now offer e-Visits for anyone 18 and older to request care online for non-urgent symptoms. For details visit [email or web address].   Also download the MyChart app! Go to the app store, search "MyChart", open the app, select [facility], and log in with your MyChart username and password.   

## 2023-03-13 ENCOUNTER — Other Ambulatory Visit: Payer: Self-pay | Admitting: Physician Assistant

## 2023-03-13 DIAGNOSIS — E039 Hypothyroidism, unspecified: Secondary | ICD-10-CM

## 2023-03-13 MED ORDER — LEVOTHYROXINE SODIUM 125 MCG PO TABS: 125.0000 ug | ORAL_TABLET | Freq: Every day | ORAL | 2 refills | Status: AC

## 2023-03-13 NOTE — Progress Notes (Signed)
I called the patient to let them know that we increase the dose of his Synthroid 125 mcg.  He was advised to ensure he does not accidentally take his old prescription and a new prescription of Synthroid.  He is scheduled to see his family doctor next week on 3/21 per patient report.  I reviewed the patient's chest x-ray with Dr. Julien Nordmann yesterday.  There is no acute findings to explain his cough and shortness of breath.  He has a history of CHF and COPD.  We had ordered a restaging CT scan to be performed in the next few weeks.  No signs of infection or fluid overload/pulmonary edema.  Dr. Julien Nordmann recommended that the patient follow-up with his PCP.  Of course, I reviewed with the patient if he develops any worsening breathing in the interval to seek emergency evaluation.

## 2023-03-16 ENCOUNTER — Encounter (HOSPITAL_COMMUNITY): Payer: Self-pay

## 2023-03-16 ENCOUNTER — Other Ambulatory Visit: Payer: Self-pay

## 2023-03-16 ENCOUNTER — Emergency Department (HOSPITAL_COMMUNITY): Payer: Non-veteran care

## 2023-03-16 ENCOUNTER — Inpatient Hospital Stay (HOSPITAL_COMMUNITY)
Admission: EM | Admit: 2023-03-16 | Discharge: 2023-03-19 | DRG: 190 | Disposition: A | Discharge status: Home / Self Care | Payer: Non-veteran care | Attending: Internal Medicine | Admitting: Internal Medicine

## 2023-03-16 DIAGNOSIS — J440 Chronic obstructive pulmonary disease with acute lower respiratory infection: Secondary | ICD-10-CM | POA: Diagnosis present

## 2023-03-16 DIAGNOSIS — Z923 Personal history of irradiation: Secondary | ICD-10-CM | POA: Diagnosis not present

## 2023-03-16 DIAGNOSIS — I130 Hypertensive heart and chronic kidney disease with heart failure and stage 1 through stage 4 chronic kidney disease, or unspecified chronic kidney disease: Secondary | ICD-10-CM | POA: Diagnosis present

## 2023-03-16 DIAGNOSIS — Z7984 Long term (current) use of oral hypoglycemic drugs: Secondary | ICD-10-CM

## 2023-03-16 DIAGNOSIS — E11649 Type 2 diabetes mellitus with hypoglycemia without coma: Secondary | ICD-10-CM | POA: Diagnosis not present

## 2023-03-16 DIAGNOSIS — E1122 Type 2 diabetes mellitus with diabetic chronic kidney disease: Secondary | ICD-10-CM

## 2023-03-16 DIAGNOSIS — R0602 Shortness of breath: Secondary | ICD-10-CM | POA: Diagnosis present

## 2023-03-16 DIAGNOSIS — I5022 Chronic systolic (congestive) heart failure: Secondary | ICD-10-CM | POA: Diagnosis present

## 2023-03-16 DIAGNOSIS — J441 Chronic obstructive pulmonary disease with (acute) exacerbation: Secondary | ICD-10-CM | POA: Diagnosis present

## 2023-03-16 DIAGNOSIS — N1832 Chronic kidney disease, stage 3b: Secondary | ICD-10-CM | POA: Diagnosis present

## 2023-03-16 DIAGNOSIS — Z6836 Body mass index (BMI) 36.0-36.9, adult: Secondary | ICD-10-CM

## 2023-03-16 DIAGNOSIS — Z79899 Other long term (current) drug therapy: Secondary | ICD-10-CM

## 2023-03-16 DIAGNOSIS — Z951 Presence of aortocoronary bypass graft: Secondary | ICD-10-CM | POA: Diagnosis not present

## 2023-03-16 DIAGNOSIS — Z79890 Hormone replacement therapy (postmenopausal): Secondary | ICD-10-CM

## 2023-03-16 DIAGNOSIS — E669 Obesity, unspecified: Secondary | ICD-10-CM | POA: Diagnosis present

## 2023-03-16 DIAGNOSIS — E1165 Type 2 diabetes mellitus with hyperglycemia: Secondary | ICD-10-CM | POA: Diagnosis present

## 2023-03-16 DIAGNOSIS — T380X5A Adverse effect of glucocorticoids and synthetic analogues, initial encounter: Secondary | ICD-10-CM | POA: Diagnosis present

## 2023-03-16 DIAGNOSIS — J189 Pneumonia, unspecified organism: Secondary | ICD-10-CM | POA: Diagnosis not present

## 2023-03-16 DIAGNOSIS — C3491 Malignant neoplasm of unspecified part of right bronchus or lung: Secondary | ICD-10-CM | POA: Diagnosis not present

## 2023-03-16 DIAGNOSIS — E039 Hypothyroidism, unspecified: Secondary | ICD-10-CM | POA: Diagnosis present

## 2023-03-16 DIAGNOSIS — Z7982 Long term (current) use of aspirin: Secondary | ICD-10-CM | POA: Diagnosis not present

## 2023-03-16 DIAGNOSIS — Z87891 Personal history of nicotine dependence: Secondary | ICD-10-CM | POA: Diagnosis not present

## 2023-03-16 DIAGNOSIS — Z794 Long term (current) use of insulin: Secondary | ICD-10-CM | POA: Diagnosis not present

## 2023-03-16 DIAGNOSIS — I2510 Atherosclerotic heart disease of native coronary artery without angina pectoris: Secondary | ICD-10-CM | POA: Diagnosis present

## 2023-03-16 DIAGNOSIS — K219 Gastro-esophageal reflux disease without esophagitis: Secondary | ICD-10-CM | POA: Diagnosis present

## 2023-03-16 DIAGNOSIS — I10 Essential (primary) hypertension: Secondary | ICD-10-CM | POA: Diagnosis present

## 2023-03-16 DIAGNOSIS — E785 Hyperlipidemia, unspecified: Secondary | ICD-10-CM | POA: Diagnosis present

## 2023-03-16 DIAGNOSIS — J159 Unspecified bacterial pneumonia: Secondary | ICD-10-CM | POA: Diagnosis not present

## 2023-03-16 DIAGNOSIS — I255 Ischemic cardiomyopathy: Secondary | ICD-10-CM | POA: Diagnosis present

## 2023-03-16 DIAGNOSIS — N183 Chronic kidney disease, stage 3 unspecified: Secondary | ICD-10-CM | POA: Diagnosis not present

## 2023-03-16 DIAGNOSIS — N1831 Chronic kidney disease, stage 3a: Secondary | ICD-10-CM | POA: Diagnosis present

## 2023-03-16 LAB — CBC WITH DIFFERENTIAL/PLATELET
Abs Immature Granulocytes: 0.02 10*3/uL (ref 0.00–0.07)
Basophils Absolute: 0.1 10*3/uL (ref 0.0–0.1)
Basophils Relative: 1 %
Eosinophils Absolute: 0.3 10*3/uL (ref 0.0–0.5)
Eosinophils Relative: 4 %
HCT: 38.9 % — ABNORMAL LOW (ref 39.0–52.0)
Hemoglobin: 12.3 g/dL — ABNORMAL LOW (ref 13.0–17.0)
Immature Granulocytes: 0 %
Lymphocytes Relative: 23 %
Lymphs Abs: 1.7 10*3/uL (ref 0.7–4.0)
MCH: 31.2 pg (ref 26.0–34.0)
MCHC: 31.6 g/dL (ref 30.0–36.0)
MCV: 98.7 fL (ref 80.0–100.0)
Monocytes Absolute: 0.8 10*3/uL (ref 0.1–1.0)
Monocytes Relative: 10 %
Neutro Abs: 4.6 10*3/uL (ref 1.7–7.7)
Neutrophils Relative %: 62 %
Platelets: 261 10*3/uL (ref 150–400)
RBC: 3.94 MIL/uL — ABNORMAL LOW (ref 4.22–5.81)
RDW: 15.4 % (ref 11.5–15.5)
WBC: 7.4 10*3/uL (ref 4.0–10.5)
nRBC: 0 % (ref 0.0–0.2)

## 2023-03-16 LAB — BASIC METABOLIC PANEL
Anion gap: 6 (ref 5–15)
BUN: 16 mg/dL (ref 8–23)
CO2: 27 mmol/L (ref 22–32)
Calcium: 8.9 mg/dL (ref 8.9–10.3)
Chloride: 108 mmol/L (ref 98–111)
Creatinine, Ser: 1.42 mg/dL — ABNORMAL HIGH (ref 0.61–1.24)
GFR, Estimated: 51 mL/min — ABNORMAL LOW (ref >60)
Glucose, Bld: 104 mg/dL — ABNORMAL HIGH (ref 70–99)
Potassium: 4.2 mmol/L (ref 3.5–5.1)
Sodium: 141 mmol/L (ref 135–145)

## 2023-03-16 LAB — CBC
HCT: 39.2 % (ref 39.0–52.0)
Hemoglobin: 12.5 g/dL — ABNORMAL LOW (ref 13.0–17.0)
MCH: 31 pg (ref 26.0–34.0)
MCHC: 31.9 g/dL (ref 30.0–36.0)
MCV: 97.3 fL (ref 80.0–100.0)
Platelets: 283 10*3/uL (ref 150–400)
RBC: 4.03 MIL/uL — ABNORMAL LOW (ref 4.22–5.81)
RDW: 15.4 % (ref 11.5–15.5)
WBC: 7.8 10*3/uL (ref 4.0–10.5)
nRBC: 0 % (ref 0.0–0.2)

## 2023-03-16 LAB — TROPONIN I (HIGH SENSITIVITY)
Troponin I (High Sensitivity): 21 ng/L — ABNORMAL HIGH (ref <18)
Troponin I (High Sensitivity): 17 ng/L (ref <18)

## 2023-03-16 LAB — LACTIC ACID, PLASMA: Lactic Acid, Venous: 1.6 mmol/L (ref 0.5–1.9)

## 2023-03-16 LAB — BRAIN NATRIURETIC PEPTIDE: B Natriuretic Peptide: 301.8 pg/mL — ABNORMAL HIGH (ref 0.0–100.0)

## 2023-03-16 MED ORDER — SODIUM CHLORIDE 0.9 % IV SOLN
500.0000 mg | Freq: Once | INTRAVENOUS | Status: AC
  Administered 2023-03-16: 500 mg via INTRAVENOUS
  Filled 2023-03-16: qty 5

## 2023-03-16 MED ORDER — METHYLPREDNISOLONE SODIUM SUCC 125 MG IJ SOLR
125.0000 mg | Freq: Once | INTRAMUSCULAR | Status: AC
  Administered 2023-03-16: 125 mg via INTRAVENOUS
  Filled 2023-03-16: qty 2

## 2023-03-16 MED ORDER — ATORVASTATIN CALCIUM 40 MG PO TABS
40.0000 mg | ORAL_TABLET | Freq: Every day | ORAL | Status: DC
  Administered 2023-03-17 – 2023-03-19 (×3): 40 mg via ORAL
  Filled 2023-03-16 (×3): qty 1

## 2023-03-16 MED ORDER — LEVOTHYROXINE SODIUM 125 MCG PO TABS
125.0000 ug | ORAL_TABLET | Freq: Every day | ORAL | Status: DC
  Administered 2023-03-17 – 2023-03-19 (×3): 125 ug via ORAL
  Filled 2023-03-16 (×3): qty 1

## 2023-03-16 MED ORDER — SODIUM CHLORIDE 0.9 % IV SOLN
2.0000 g | INTRAVENOUS | Status: DC
  Administered 2023-03-17 – 2023-03-18 (×2): 2 g via INTRAVENOUS
  Filled 2023-03-16 (×2): qty 20

## 2023-03-16 MED ORDER — ACETAMINOPHEN 650 MG RE SUPP: 650.0000 mg | Freq: Four times a day (QID) | RECTAL | Status: DC | PRN

## 2023-03-16 MED ORDER — ENOXAPARIN SODIUM 40 MG/0.4ML IJ SOSY
40.0000 mg | PREFILLED_SYRINGE | INTRAMUSCULAR | Status: DC
  Administered 2023-03-16 – 2023-03-18 (×3): 40 mg via SUBCUTANEOUS
  Filled 2023-03-16 (×3): qty 0.4

## 2023-03-16 MED ORDER — ASPIRIN 81 MG PO CHEW
81.0000 mg | CHEWABLE_TABLET | Freq: Every day | ORAL | Status: DC
  Administered 2023-03-17 – 2023-03-19 (×3): 81 mg via ORAL
  Filled 2023-03-16 (×3): qty 1

## 2023-03-16 MED ORDER — IPRATROPIUM-ALBUTEROL 0.5-2.5 (3) MG/3ML IN SOLN
3.0000 mL | Freq: Once | RESPIRATORY_TRACT | Status: AC
  Administered 2023-03-16: 3 mL via RESPIRATORY_TRACT
  Filled 2023-03-16: qty 3

## 2023-03-16 MED ORDER — IOHEXOL 350 MG/ML SOLN
80.0000 mL | Freq: Once | INTRAVENOUS | Status: AC | PRN
  Administered 2023-03-16: 80 mL via INTRAVENOUS

## 2023-03-16 MED ORDER — ARFORMOTEROL TARTRATE 15 MCG/2ML IN NEBU
15.0000 ug | INHALATION_SOLUTION | Freq: Two times a day (BID) | RESPIRATORY_TRACT | Status: DC
  Administered 2023-03-17 – 2023-03-19 (×5): 15 ug via RESPIRATORY_TRACT
  Filled 2023-03-16 (×5): qty 2

## 2023-03-16 MED ORDER — SODIUM CHLORIDE 0.9 % IV SOLN
1.0000 g | Freq: Once | INTRAVENOUS | Status: AC
  Administered 2023-03-16: 1 g via INTRAVENOUS
  Filled 2023-03-16: qty 10

## 2023-03-16 MED ORDER — IPRATROPIUM-ALBUTEROL 0.5-2.5 (3) MG/3ML IN SOLN
3.0000 mL | Freq: Four times a day (QID) | RESPIRATORY_TRACT | Status: DC
  Administered 2023-03-16 – 2023-03-17 (×5): 3 mL via RESPIRATORY_TRACT
  Filled 2023-03-16 (×5): qty 3

## 2023-03-16 MED ORDER — BUDESONIDE 0.25 MG/2ML IN SUSP
0.2500 mg | Freq: Two times a day (BID) | RESPIRATORY_TRACT | Status: DC
  Administered 2023-03-16 – 2023-03-19 (×6): 0.25 mg via RESPIRATORY_TRACT
  Filled 2023-03-16 (×6): qty 2

## 2023-03-16 MED ORDER — OXYCODONE HCL 5 MG PO TABS: 5.0000 mg | ORAL_TABLET | ORAL | Status: DC | PRN

## 2023-03-16 MED ORDER — ACETAMINOPHEN 325 MG PO TABS: 650.0000 mg | ORAL_TABLET | Freq: Four times a day (QID) | ORAL | Status: DC | PRN

## 2023-03-16 MED ORDER — PREDNISONE 50 MG PO TABS
50.0000 mg | ORAL_TABLET | Freq: Every day | ORAL | Status: DC
  Administered 2023-03-17 – 2023-03-18 (×2): 50 mg via ORAL
  Filled 2023-03-16 (×2): qty 1

## 2023-03-16 MED ORDER — AZITHROMYCIN 250 MG PO TABS
500.0000 mg | ORAL_TABLET | Freq: Every day | ORAL | Status: DC
  Administered 2023-03-17 – 2023-03-18 (×2): 500 mg via ORAL
  Filled 2023-03-16 (×2): qty 2

## 2023-03-16 MED ORDER — CARVEDILOL 25 MG PO TABS
25.0000 mg | ORAL_TABLET | Freq: Every day | ORAL | Status: DC
  Administered 2023-03-17 – 2023-03-18 (×2): 25 mg via ORAL
  Filled 2023-03-16 (×2): qty 1

## 2023-03-16 NOTE — ED Triage Notes (Signed)
SOB, cough, fatigue, weakness for a week. SOB is worse on exertion.  Pt states he had a chest xray last week and it was negative.  Decreased appetite. Active lung cancer, last chemo tx last week.

## 2023-03-16 NOTE — ED Notes (Signed)
Patient transported to CT 

## 2023-03-16 NOTE — ED Provider Notes (Signed)
[facility] Provider Note   CSN: [id number] Arrival date & time: 03/16/23  1346     History  Chief Complaint  Patient presents with   Shortness of Breath   Cough    Paul [name] is a 77 y.o. male with a past medical history significant for adenocarcinoma of right lung stage III currently undergoing chemotherapy, hyperlipidemia, CAD s/p CABG who presents to the ED due to shortness of breath, cough, fatigue, and generalized weakness x 1 week.  Patient states shortness of breath is worse with exertion.  Denies associated chest pain.  Patient last had chemotherapy last week.  History of CHF.  Denies lower extremity edema.  Patient recently been to the hospital on 02/02/2023 for community-acquired pneumonia.  Admits to feeling feverish however, no documented fever. Admits to tobacco use. No history of blood clots.   History obtained from patient and past medical records. No interpreter used during encounter.       Home Medications Prior to Admission medications   Medication Sig Start Date End Date Taking? Authorizing Provider  albuterol (VENTOLIN HFA) 108 (90 Base) MCG/ACT inhaler Inhale 2 puffs into the lungs every 6 (six) hours as needed for wheezing or shortness of breath. 03/12/23   Heilingoetter, Cassandra L, PA-C  ammonium lactate (LAC-HYDRIN) 12 % lotion Apply 1 Application topically in the morning and at bedtime. 03/17/22   [provider]  aspirin 81 MG chewable tablet Take 81 mg by mouth daily.    [provider]  atorvastatin (LIPITOR) 40 MG tablet Take 40 mg by mouth daily.    [provider]  benzonatate (TESSALON) 100 MG capsule Take 1 capsule (100 mg total) by mouth 3 (three) times daily as needed for cough. 10/22/22   Heilingoetter, Cassandra L, PA-C  blood glucose meter kit and supplies KIT Dispense based on patient and insurance preference. Use up to four times daily as directed. 06/24/21   Oswald Hillock, MD  carvedilol (COREG) 25 MG tablet Take 25 mg by mouth at bedtime. 04/14/18   [provider]  Insulin Glargine-aglr 100 UNIT/ML SOPN Inject into the skin. 08/23/22   [provider]  levothyroxine (SYNTHROID) 125 MCG tablet Take 1 tablet (125 mcg total) by mouth daily before breakfast. 03/13/23   Heilingoetter, Cassandra L, PA-C  lidocaine-prilocaine (EMLA) cream Apply 1 application topically as needed. 12/10/21   Heilingoetter, Cassandra L, PA-C  Menthol-Methyl Salicylate (MUSCLE RUB) 10-15 % CREA Apply 1 application topically daily as needed for muscle pain.    [provider]  metFORMIN (GLUCOPHAGE) 500 MG tablet Take 1 tablet (500 mg total) by mouth 2 (two) times daily with a meal. 06/24/21 07/09/22  Oswald Hillock, MD  Multiple Vitamins-Minerals (MULTIVITAMIN WITH MINERALS) tablet Take 1 tablet by mouth daily.    [provider]  nitroGLYCERIN (NITROSTAT) 0.4 MG SL tablet Place 0.4 mg under the tongue every 5 (five) minutes as needed for chest pain. 09/06/18   [provider]  prochlorperazine (COMPAZINE) 10 MG tablet Take 1 tablet (10 mg total) by mouth every 6 (six) hours as needed for nausea or vomiting. 06/17/21   Curt Bears, MD  urea (CARMOL) 20 % cream Apply 1 Application topically 2 (two) times daily. 03/17/22   [provider]      Allergies    Patient has no known allergies.    Review of Systems   Review of Systems  Constitutional:  Positive for chills, fatigue and fever.  Respiratory:  Positive for cough and shortness of breath.   Cardiovascular:  Negative for chest pain and leg swelling.  All other systems reviewed and are negative.   Physical Exam Updated Vital Signs BP (!) 156/108   Pulse 85   Temp 98.3 F (36.8 C) (Oral)   Resp (!) 28   Ht 5\' 8"  (1.727 m)   Wt 109.8 kg   SpO2 93%   BMI 36.80 kg/m  Physical Exam Vitals and nursing note reviewed.  Constitutional:      General: He is not in acute distress.     Appearance: He is not ill-appearing.  HENT:     Head: Normocephalic.  Eyes:     Pupils: Pupils are equal, round, and reactive to light.  Cardiovascular:     Rate and Rhythm: Normal rate and regular rhythm.     Pulses: Normal pulses.     Heart sounds: Normal heart sounds. No murmur heard.    No friction rub. No gallop.  Pulmonary:     Effort: Pulmonary effort is normal.     Breath sounds: Wheezing and rhonchi present.  Abdominal:     General: Abdomen is flat. There is no distension.     Palpations: Abdomen is soft.     Tenderness: There is no abdominal tenderness. There is no guarding or rebound.  Musculoskeletal:        General: Normal range of motion.     Cervical back: Neck supple.     Comments: Trace edema to bilateral lower extremities.   Skin:    General: Skin is warm and dry.  Neurological:     General: No focal deficit present.     Mental Status: He is alert.  Psychiatric:        Mood and Affect: Mood normal.        Behavior: Behavior normal.     ED Results / Procedures / Treatments   Labs (all labs ordered are listed, but only abnormal results are displayed) Labs Reviewed  BASIC METABOLIC PANEL - Abnormal; Notable for the following components:      Result Value   Glucose, Bld 104 (*)    Creatinine, Ser 1.42 (*)    GFR, Estimated 51 (*)    All other components within normal limits  BRAIN NATRIURETIC PEPTIDE - Abnormal; Notable for the following components:   B Natriuretic Peptide 301.8 (*)    All other components within normal limits  CBC WITH DIFFERENTIAL/PLATELET - Abnormal; Notable for the following components:   RBC 3.94 (*)    Hemoglobin 12.3 (*)    HCT 38.9 (*)    All other components within normal limits  TROPONIN I (HIGH SENSITIVITY) - Abnormal; Notable for the following components:   Troponin I (High Sensitivity) 21 (*)    All other components within normal limits  CULTURE, BLOOD (ROUTINE X 2)  CULTURE, BLOOD (ROUTINE X 2)  LACTIC ACID, PLASMA   LACTIC ACID, PLASMA  TROPONIN I (HIGH SENSITIVITY)    EKG None  Radiology CT Angio Chest PE W and/or Wo Contrast  Result Date: 03/16/2023 CLINICAL DATA:  History of lung cancer and current chemotherapy, presenting with cough, weakness and shortness of breath. EXAM: CT ANGIOGRAPHY CHEST WITH CONTRAST TECHNIQUE: Multidetector CT imaging of the chest was performed using the standard protocol during bolus administration of intravenous contrast. Multiplanar CT image reconstructions and MIPs were obtained to evaluate the vascular anatomy. RADIATION DOSE REDUCTION: This exam was performed according to the departmental dose-optimization program which  includes automated exposure control, adjustment of the mA and/or kV according to patient size and/or use of iterative reconstruction technique. CONTRAST:  42mL OMNIPAQUE IOHEXOL 350 MG/ML SOLN COMPARISON:  December 17, 2022 FINDINGS: Cardiovascular: Stable, marked severity atherosclerotic disease is seen throughout the aortic arch. Satisfactory opacification of the pulmonary arteries to the segmental level. There is tapering of the posterior right lower lobe branch of the right pulmonary artery, without opacification of its distal branches. There is mild cardiomegaly with mild to moderate severity coronary artery calcification. No pericardial effusion. Mediastinum/Nodes: Stable subcentimeter AP window and paratracheal lymph nodes are seen. Thyroid gland, trachea, and esophagus demonstrate no significant findings. Lungs/Pleura: Moderate severity areas of scarring, atelectasis and/or pulmonary fibrosis are seen within the peripheral aspect of the anterior right upper lobe, anteromedial right middle lobe and posteromedial right lower lobe. Marked severity consolidation and post treatment changes are again seen within the posteromedial aspect of the right lower lobe, from the perihilar region to the right lung base. Predominant stable moderate to marked severity left  lower lobe fibrotic scarring and atelectasis is noted. There is a small, partially loculated right pleural effusion. Upper Abdomen: No acute abnormality. Musculoskeletal: Multiple sternal wires are noted. No acute osseous abnormalities are identified. Review of the MIP images confirms the above findings. IMPRESSION: 1. No evidence of pulmonary embolism. 2. Marked severity right lower lobe consolidation and post treatment changes, as described above. 3. Small, partially loculated right pleural effusion. 4. Findings consistent with underlying pulmonary fibrosis with superimposed areas of abdominal right upper lobe and right middle lobe atelectasis and/or infiltrate. 5. Aortic atherosclerosis. Aortic Atherosclerosis (ICD10-I70.0). Electronically Signed   By: Virgina Norfolk M.D.   On: 03/16/2023 19:00   DG Chest 2 View  Result Date: 03/16/2023 CLINICAL DATA:  Shortness of breath. History of treated non-small-cell lung cancer EXAM: CHEST - 2 VIEW COMPARISON:  X-ray 03/12/2023.  CT 12/17/2022 and older FINDINGS: Status post median sternotomy with enlarged cardiopericardial silhouette. Tortuous ectatic aorta. Vascular congestion. Tiny pleural effusions. No pneumothorax. Overlapping cardiac leads. IMPRESSION: Postop chest. Enlarged cardiopericardial silhouette with some vascular congestion. Tiny pleural effusions. No pneumothorax Electronically Signed   By: Jill Side M.D.   On: 03/16/2023 14:27    Procedures Procedures    Medications Ordered in ED Medications  cefTRIAXone (ROCEPHIN) 1 g in sodium chloride 0.9 % 100 mL IVPB (has no administration in time range)  azithromycin (ZITHROMAX) 500 mg in sodium chloride 0.9 % 250 mL IVPB (has no administration in time range)  methylPREDNISolone sodium succinate (SOLU-MEDROL) 125 mg/2 mL injection 125 mg (125 mg Intravenous Given 03/16/23 1623)  ipratropium-albuterol (DUONEB) 0.5-2.5 (3) MG/3ML nebulizer solution 3 mL (3 mLs Nebulization Given 03/16/23 1512)   iohexol (OMNIPAQUE) 350 MG/ML injection 80 mL (80 mLs Intravenous Contrast Given 03/16/23 1812)    ED Course/ Medical Decision Making/ A&P Clinical Course as of 03/16/23 2114  Mon Mar 16, 2023  1657 B Natriuretic Peptide(!): 301.8 [CA]    Clinical Course User Index [CA] Suzy Bouchard, PA-C                             Medical Decision Making Amount and/or Complexity of Data Reviewed External Data Reviewed: notes. Labs: ordered. Decision-making details documented in ED Course. Radiology: ordered and independent interpretation performed. Decision-making details documented in ED Course. ECG/medicine tests: ordered and independent interpretation performed. Decision-making details documented in ED Course.  Risk Prescription drug management. Decision regarding hospitalization.  This patient presents to the ED for concern of SOB, this involves an extensive number of treatment options, and is a complaint that carries with it a high risk of complications and morbidity.  The differential diagnosis includes CHF, COPD, PNA, PE, etc  77 year old male with history of lung cancer presents to the ED due to shortness of breath, cough, fatigue, generalized weakness x 1 week.  Shortness of breath worse with exertion.  Recent admission 1.5 months ago for community-acquired pneumonia.  Patient actively undergoing chemotherapy with last treatment last week.  Upon arrival, patient afebrile not tachycardic or hypoxic.  Patient in no acute distress.  Lungs with bilateral wheeze and rhonchi.  Trace lower extremity edema bilaterally.  Routine labs ordered.  Troponin to rule out ACS.  Chest x-ray to rule out evidence of pneumonia.  BNP to rule out CHF exacerbation.  Given wheeze patient given Solu-Medrol and DuoNeb for possible COPD exacerbation.  Admits to continued tobacco use.  CBC with anemia with hemoglobin 12.3 which is slightly lower than 4 days ago.  No leukocytosis.  Troponin elevated at 21. EKG  NSR. BNP elevated at 301. Delta troponin ordered to rule out ACS; however suspicion is lower. BMP significant for elevated creatinine 1.42.  No major electrolyte derangements.  X-ray personally reviewed and interpreted demonstrates enlarged cardiopericardial silhouette with some vascular congestion. CTA ordered to rule out PE given history of cancer vs. PNA.  CTA demonstrates right lower lobe consolidation.  When compared to previous CT it appears worse in appearance.  IV antibiotics for pneumonia started.  Lactic acid and blood cultures added.  Given patient's history feel patient would benefit from admission for IV and asked for pneumonia.  Reassessed patient who notes some improvement in SOB after steroids and duoneb.   Discussed with Dr. Francia Greaves who agrees with assessment and plan.   9:09 PM Discussed with Dr. Annie Paras with [facility] who agrees to admit patient.   Has PCP       Final Clinical Impression(s) / ED Diagnoses Final diagnoses:  Community acquired pneumonia of right lower lobe of lung    Rx / DC Orders ED Discharge Orders     None         Karie Kirks 03/16/23 2115    Wynona Dove A, DO 03/17/23 [phone]

## 2023-03-16 NOTE — H&P (Signed)
History and Physical    Paul [name] [id number] DOB: 11-25-1946 DOA: 03/16/2023  PCP: [name]   Chief Complaint: sob  HPI: Paul [name] is a 77 y.o. male with medical history significant of CHF, CAD status post CABG, type 2 diabetes, hypertension, hypothyroidism, lung cancer who presented to emergency department with shortness of breath cough fatigue and weakness for the last week.  Patient states that he has worsening shortness of breath with exertion.  He is currently on chemotherapy for his lung cancer.  He was hospitalized in February for community-acquired pneumonia.  He presented to the ER where he was found to be afebrile hemodynamically able.  Labs were obtained on presentation which revealed WBC 7.4, hemoglobin 12.3, creatinine 1.4 baseline, BNP 301, troponin 21, 17, lactic acid 1.6 chest x-ray was obtained which showed some vascular congestion.  CT pulmonary embolism study demonstrated no evidence of PE however right lower lobe consolidation and concern for pulmonary fibrosis.  Patient was noted to have wheezing and rhonchi in the lungs.  Was admitted for concern for COPD exacerbation.  On evaluation patient had audible wheezing and a productive cough.  Patient Dors is compliance with home inhalers. Review of Systems: Review of Systems  All other systems reviewed and are negative.    As per HPI otherwise 10 point review of systems negative.   No Known Allergies  Past Medical History:  Diagnosis Date   CHF (congestive heart failure) (HCC)    Coronary artery disease    Diabetes mellitus without complication ([place])    History of radiation therapy    Right lung- 11/27/21-01/10/22- Dr. [name]   Hypertension    Hypothyroidism    Ischemic cardiomyopathy    Pneumonia    a long time ago   Sleep apnea    no longer uses a cpap    Past Surgical History:  Procedure Laterality Date   BRONCHIAL BIOPSY  06/04/2021   Procedure: BRONCHIAL BIOPSIES;   Surgeon: Garner Nash, DO;  Location: [facility];  Service: Pulmonary;;   BRONCHIAL BRUSHINGS  06/04/2021   Procedure: BRONCHIAL BRUSHINGS;  Surgeon: Garner Nash, DO;  Location: [facility];  Service: Pulmonary;;   BRONCHIAL NEEDLE ASPIRATION BIOPSY  06/04/2021   Procedure: BRONCHIAL NEEDLE ASPIRATION BIOPSIES;  Surgeon: Garner Nash, DO;  Location: [facility];  Service: Pulmonary;;   BRONCHIAL WASHINGS  06/04/2021   Procedure: BRONCHIAL WASHINGS;  Surgeon: Garner Nash, DO;  Location: [facility];  Service: Pulmonary;;   CARDIAC SURGERY     COLONOSCOPY     CORONARY ARTERY BYPASS GRAFT     2019 at [place] Right 06/04/2021   Procedure: [place];  Surgeon: Garner Nash, DO;  Location: [facility];  Service: Pulmonary;  Laterality: Right;   VIDEO BRONCHOSCOPY WITH ENDOBRONCHIAL ULTRASOUND  06/04/2021   Procedure: VIDEO BRONCHOSCOPY WITH ENDOBRONCHIAL ULTRASOUND;  Surgeon: Garner Nash, DO;  Location: [facility];  Service: Pulmonary;;     reports that he has been smoking cigarettes. He started smoking about 59 years ago. He has been smoking an average of .5 packs per day. He has never used smokeless tobacco. He reports that he does not drink alcohol and does not use drugs.  Family History  Family history unknown: Yes    Prior to Admission medications   Medication Sig Start Date End Date Taking? Authorizing Provider  albuterol (VENTOLIN HFA) 108 (90 Base) MCG/ACT inhaler Inhale 2 puffs into  the lungs every 6 (six) hours as needed for wheezing or shortness of breath. 03/12/23   Heilingoetter, Cassandra L, PA-C  ammonium lactate (LAC-HYDRIN) 12 % lotion Apply 1 Application topically in the morning and at bedtime. 03/17/22   [provider]  aspirin 81 MG chewable tablet Take 81 mg by mouth daily.    [provider]  atorvastatin (LIPITOR) 40 MG tablet Take 40  mg by mouth daily.    [provider]  benzonatate (TESSALON) 100 MG capsule Take 1 capsule (100 mg total) by mouth 3 (three) times daily as needed for cough. 10/22/22   Heilingoetter, Cassandra L, PA-C  blood glucose meter kit and supplies KIT Dispense based on patient and insurance preference. Use up to four times daily as directed. 06/24/21   Oswald Hillock, MD  carvedilol (COREG) 25 MG tablet Take 25 mg by mouth at bedtime. 04/14/18   [provider]  Insulin Glargine-aglr 100 UNIT/ML SOPN Inject into the skin. 08/23/22   [provider]  levothyroxine (SYNTHROID) 125 MCG tablet Take 1 tablet (125 mcg total) by mouth daily before breakfast. 03/13/23   Heilingoetter, Cassandra L, PA-C  lidocaine-prilocaine (EMLA) cream Apply 1 application topically as needed. 12/10/21   Heilingoetter, Cassandra L, PA-C  Menthol-Methyl Salicylate (MUSCLE RUB) 10-15 % CREA Apply 1 application topically daily as needed for muscle pain.    [provider]  metFORMIN (GLUCOPHAGE) 500 MG tablet Take 1 tablet (500 mg total) by mouth 2 (two) times daily with a meal. 06/24/21 07/09/22  Oswald Hillock, MD  Multiple Vitamins-Minerals (MULTIVITAMIN WITH MINERALS) tablet Take 1 tablet by mouth daily.    [provider]  nitroGLYCERIN (NITROSTAT) 0.4 MG SL tablet Place 0.4 mg under the tongue every 5 (five) minutes as needed for chest pain. 09/06/18   [provider]  prochlorperazine (COMPAZINE) 10 MG tablet Take 1 tablet (10 mg total) by mouth every 6 (six) hours as needed for nausea or vomiting. 06/17/21   Curt Bears, MD  urea (CARMOL) 20 % cream Apply 1 Application topically 2 (two) times daily. 03/17/22   [provider]    Physical Exam: Vitals:   03/16/23 1353 03/16/23 1800 03/16/23 1930 03/16/23 2100  BP:  (!) 133/92 (!) 156/108 (!) 151/94  Pulse:  76 85 86  Resp:  18 (!) 28 20  Temp:  98.3 F (36.8 C)    TempSrc:  Oral    SpO2:  96% 93% 94%  Weight:  109.8 kg     Height: 5\' 8"  (1.727 m)      Physical Exam Constitutional:      Appearance: He is normal weight.  Cardiovascular:     Rate and Rhythm: Normal rate and regular rhythm.  Pulmonary:     Effort: Pulmonary effort is normal.  Abdominal:     Palpations: Abdomen is soft.  Musculoskeletal:        General: Normal range of motion.     Cervical back: Normal range of motion.  Skin:    General: Skin is warm.     Capillary Refill: Capillary refill takes less than 2 seconds.  Neurological:     General: No focal deficit present.     Mental Status: He is alert.       Labs on Admission: I have personally reviewed the patients's labs and imaging studies.  Assessment/Plan Principal Problem:   Community acquired bacterial pneumonia COPD exacerbation most likely due to bacterial commune acquired pneumonia, POA, active - Consolidation  on imaging in setting of productive cough and wheezing - Responded well to inhalers  Plan: Placed on Brovana, Pulmicort, ceftriaxone, azithromycin 5 days of prednisone Scheduled DuoNebs  Hypothyroidism-continue Synthroid  CAD status post CABG-continue aspirin, statin  Hypertension-continue Coreg   Admission status: Inpatient Med-Surg  Certification: The appropriate patient status for this patient is INPATIENT. Inpatient status is judged to be reasonable and necessary in order to provide the required intensity of service to ensure the patient's safety. The patient's presenting symptoms, physical exam findings, and initial radiographic and laboratory data in the context of their chronic comorbidities is felt to place them at high risk for further clinical deterioration. Furthermore, it is not anticipated that the patient will be medically stable for discharge from the hospital within 2 midnights of admission.   * I certify that at the point of admission it is my clinical judgment that the patient will require inpatient hospital care spanning beyond  2 midnights from the point of admission due to high intensity of service, high risk for further deterioration and high frequency of surveillance required.Emilee Hero MD [facility] If 7PM-7AM, please contact night-coverage [email or web address] Ceftriaxone azithromycin - Status post 03/16/2023, 10:00 PM

## 2023-03-17 ENCOUNTER — Encounter (HOSPITAL_COMMUNITY): Payer: Self-pay | Admitting: Internal Medicine

## 2023-03-17 DIAGNOSIS — J159 Unspecified bacterial pneumonia: Secondary | ICD-10-CM | POA: Diagnosis not present

## 2023-03-17 LAB — CREATININE, SERUM
Creatinine, Ser: 1.43 mg/dL — ABNORMAL HIGH (ref 0.61–1.24)
GFR, Estimated: 50 mL/min — ABNORMAL LOW (ref >60)

## 2023-03-17 MED ORDER — IPRATROPIUM-ALBUTEROL 0.5-2.5 (3) MG/3ML IN SOLN
3.0000 mL | Freq: Three times a day (TID) | RESPIRATORY_TRACT | Status: DC
  Administered 2023-03-18: 3 mL via RESPIRATORY_TRACT
  Filled 2023-03-17: qty 3

## 2023-03-17 MED ORDER — GUAIFENESIN-DM 100-10 MG/5ML PO SYRP
5.0000 mL | ORAL_SOLUTION | ORAL | Status: DC | PRN
  Administered 2023-03-17 (×2): 5 mL via ORAL
  Filled 2023-03-17 (×2): qty 10

## 2023-03-17 MED ORDER — HYDROCODONE BIT-HOMATROP MBR 5-1.5 MG/5ML PO SOLN
5.0000 mL | ORAL | Status: DC | PRN
  Administered 2023-03-17 – 2023-03-18 (×3): 5 mL via ORAL
  Filled 2023-03-17 (×3): qty 5

## 2023-03-17 MED ORDER — BENZONATATE 100 MG PO CAPS
100.0000 mg | ORAL_CAPSULE | Freq: Once | ORAL | Status: AC
  Administered 2023-03-17: 100 mg via ORAL
  Filled 2023-03-17: qty 1

## 2023-03-17 NOTE — TOC Initial Note (Signed)
Transition of Care [facility]) - Initial/Assessment Note    Patient Details  Name: Paul [name] MRN: [id number] Date of Birth: 08-Dec-1946  Transition of Care [facility]) CM/SW Contact:    Ninfa Meeker, RN Phone Number: 03/17/2023, 11:58 AM  Clinical Narrative:                  Transition of Care [facility]) Department has reviewed patient and no TOC needs have been identified at this time. We will continue to monitor patient advancement through Interdisciplinary progressions and if new patient needs arise, please place a consult.        Patient Goals and CMS Choice            Expected Discharge Plan and Services                                              Prior Living Arrangements/Services                       Activities of Daily Living Home Assistive Devices/Equipment: None ADL Screening (condition at time of admission) Patient's cognitive ability adequate to safely complete daily activities?: Yes Is the patient deaf or have difficulty hearing?: No Does the patient have difficulty seeing, even when wearing glasses/contacts?: No Does the patient have difficulty concentrating, remembering, or making decisions?: No Patient able to express need for assistance with ADLs?: No Independently performs ADLs?: Yes (appropriate for developmental age) Does the patient have difficulty walking or climbing stairs?: No Weakness of Legs: None Weakness of Arms/Hands: None  Permission Sought/Granted                  Emotional Assessment              Admission diagnosis:  Community acquired bacterial pneumonia [J15.9] Community acquired pneumonia of right lower lobe of lung [J18.9] Patient Active Problem List   Diagnosis Date Noted   Community acquired bacterial pneumonia 03/16/2023   Shortness of breath 03/12/2023   AKI (acute kidney injury) ([place]) 07/10/2022   Hyperkalemia 07/10/2022   Hypokalemia 07/10/2022   DKA (diabetic ketoacidosis) ([place]) 07/09/2022    Cough 06/04/2022   Goals of care, counseling/discussion 10/23/2021   Hyperosmolar hyperglycemic state (HHS) ([place]) 06/23/2021   Hyperglycemia due to diabetes mellitus ([place]) 06/22/2021   GERD (gastroesophageal reflux disease) 06/22/2021   Essential hypertension 06/22/2021   Hyperlipidemia 06/22/2021   Adenocarcinoma of right lung, stage 3 ([place]) 06/17/2021   Encounter for antineoplastic chemotherapy 06/17/2021   Encounter for antineoplastic immunotherapy 06/17/2021   Lung nodule 05/17/2021   Stage 3a chronic kidney disease (CKD) ([place]) 09/03/2018   CAD, multiple vessel 09/03/2018   PCP:  [facility] [place]:   [place], [place] - [address] [place] [address] Phone: [phone] Fax: [phone]     Social Determinants of Health (SDOH) Social History: SDOH Screenings   Food Insecurity: No Food Insecurity (03/17/2023)  Housing: Low Risk  (03/17/2023)  Transportation Needs: No Transportation Needs (03/17/2023)  Utilities: Not At Risk (03/17/2023)  Financial Resource Strain: Low Risk  (11/22/2021)  Social Connections: Moderately Isolated (11/22/2021)  Tobacco Use: High Risk (03/17/2023)   SDOH Interventions:     Readmission Risk Interventions     No data to display

## 2023-03-17 NOTE — Progress Notes (Addendum)
PROGRESS NOTE    Paul [name]  [id number]  DOB: 1946-04-04  DOA: 03/16/2023 PCP: [name]:   Hospital course:  77 year old man with lung cancer, COPD, CHF, DM2 and HTN was admitted last night for RLL CAP and COPD flare.  He was started on ceftriaxone, azithromycin, inhaled bronchodilators and DuoNebs.  Subjective:  Patient thinks he might feel a little bit better, notes that his breathing is a little "easier".  His main concern is continued cough which is nonproductive.  Notes the cough is preventing him from sleeping at night and is making him more tired.   Objective: Vitals:   03/17/23 1000 03/17/23 1015 03/17/23 1227 03/17/23 1440  BP: (!) 161/91  137/86   Pulse: 85 91 83   Resp: (!) 33 (!) 28 (!) 24   Temp:   97.8 F (36.6 C)   TempSrc:   Oral   SpO2: 95% 97% 96% 97%  Weight:      Height:        Intake/Output Summary (Last 24 hours) at 03/17/2023 1647 Last data filed at 03/16/2023 2344 Gross per 24 hour  Intake 373.48 ml  Output --  Net 373.48 ml   Filed Weights   03/16/23 1353  Weight: 109.8 kg     Exam:  General: Chronically ill-appearing man lying in bed in no respiratory distress.  Able to speak in full sentences without difficulty. Eyes: sclera anicteric, conjuctiva mild injection bilaterally CVS: S1-S2, regular  Respiratory:  decreased air entry bilaterally secondary to decreased inspiratory effort GI: NABS, soft, NT  LE: Warm and well-perfused Neuro: A/O x 3,  grossly nonfocal.   Data Reviewed:  Basic Metabolic Panel: Recent Labs  Lab 03/12/23 0930 03/16/23 1612 03/16/23 2345  NA 143 141  --   K 4.3 4.2  --   CL 109 108  --   CO2 27 27  --   GLUCOSE 164* 104*  --   BUN 12 16  --   CREATININE 1.49* 1.42* 1.43*  CALCIUM 9.4 8.9  --     CBC: Recent Labs  Lab 03/12/23 0930 03/16/23 1612 03/16/23 2345  WBC 7.9 7.4 7.8  NEUTROABS 5.1 4.6  --   HGB 13.0 12.3* 12.5*  HCT 38.7* 38.9*  39.2  MCV 94.6 98.7 97.3  PLT 252 261 283     Scheduled Meds:  arformoterol  15 mcg Nebulization BID   aspirin  81 mg Oral Daily   atorvastatin  40 mg Oral Daily   azithromycin  500 mg Oral Daily   budesonide (PULMICORT) nebulizer solution  0.25 mg Nebulization BID   carvedilol  25 mg Oral QHS   enoxaparin (LOVENOX) injection  40 mg Subcutaneous Q24H   ipratropium-albuterol  3 mL Nebulization Q6H   levothyroxine  125 mcg Oral QAC breakfast   predniSONE  50 mg Oral Q breakfast   Continuous Infusions:  cefTRIAXone (ROCEPHIN)  IV       Assessment & Plan:   RLL CAP Continue ceftriaxone and azithromycin day #2 Patient is eating and drinking reasonably well so will not start IV fluid resuscitation at this time.  COPD flare Continue steroids and inhaled bronchodilators Patient has also been continued on his home doses of Brovana and Pulmicort as well  Cough Tessalon Perles and Robitussin have not been effective Trial of Hycodan syrup  HFrEF EF 30 to 35% in 2022 At present patient seems to be euvolemic Continue carvedilol, patient does not seem to be on Lasix  at home  [place] Stage IIIb adenocarcinoma, is followed by Dr. Earlie Server Patient currently on consolidation with immunotherapy deficiency every month  CAD/HTN Continue aspirin, carvedilol and statin  Hypothyroidism Continue Synthroid    DVT prophylaxis: Lovenox Code Status: Full Family Communication: None today    Studies: CT Angio Chest PE W and/or Wo Contrast  Result Date: 03/16/2023 CLINICAL DATA:  History of lung cancer and current chemotherapy, presenting with cough, weakness and shortness of breath. EXAM: CT ANGIOGRAPHY CHEST WITH CONTRAST TECHNIQUE: Multidetector CT imaging of the chest was performed using the standard protocol during bolus administration of intravenous contrast. Multiplanar CT image reconstructions and MIPs were obtained to evaluate the vascular anatomy. RADIATION DOSE REDUCTION: This  exam was performed according to the departmental dose-optimization program which includes automated exposure control, adjustment of the mA and/or kV according to patient size and/or use of iterative reconstruction technique. CONTRAST:  51mL OMNIPAQUE IOHEXOL 350 MG/ML SOLN COMPARISON:  December 17, 2022 FINDINGS: Cardiovascular: Stable, marked severity atherosclerotic disease is seen throughout the aortic arch. Satisfactory opacification of the pulmonary arteries to the segmental level. There is tapering of the posterior right lower lobe branch of the right pulmonary artery, without opacification of its distal branches. There is mild cardiomegaly with mild to moderate severity coronary artery calcification. No pericardial effusion. Mediastinum/Nodes: Stable subcentimeter AP window and paratracheal lymph nodes are seen. Thyroid gland, trachea, and esophagus demonstrate no significant findings. Lungs/Pleura: Moderate severity areas of scarring, atelectasis and/or pulmonary fibrosis are seen within the peripheral aspect of the anterior right upper lobe, anteromedial right middle lobe and posteromedial right lower lobe. Marked severity consolidation and post treatment changes are again seen within the posteromedial aspect of the right lower lobe, from the perihilar region to the right lung base. Predominant stable moderate to marked severity left lower lobe fibrotic scarring and atelectasis is noted. There is a small, partially loculated right pleural effusion. Upper Abdomen: No acute abnormality. Musculoskeletal: Multiple sternal wires are noted. No acute osseous abnormalities are identified. Review of the MIP images confirms the above findings. IMPRESSION: 1. No evidence of pulmonary embolism. 2. Marked severity right lower lobe consolidation and post treatment changes, as described above. 3. Small, partially loculated right pleural effusion. 4. Findings consistent with underlying pulmonary fibrosis with superimposed  areas of abdominal right upper lobe and right middle lobe atelectasis and/or infiltrate. 5. Aortic atherosclerosis. Aortic Atherosclerosis (ICD10-I70.0). Electronically Signed   By: Virgina Norfolk M.D.   On: 03/16/2023 19:00   DG Chest 2 View  Result Date: 03/16/2023 CLINICAL DATA:  Shortness of breath. History of treated non-small-cell lung cancer EXAM: CHEST - 2 VIEW COMPARISON:  X-ray 03/12/2023.  CT 12/17/2022 and older FINDINGS: Status post median sternotomy with enlarged cardiopericardial silhouette. Tortuous ectatic aorta. Vascular congestion. Tiny pleural effusions. No pneumothorax. Overlapping cardiac leads. IMPRESSION: Postop chest. Enlarged cardiopericardial silhouette with some vascular congestion. Tiny pleural effusions. No pneumothorax Electronically Signed   By: Jill Side M.D.   On: 03/16/2023 14:27    Principal Problem:   Community acquired bacterial pneumonia     Vashti Hey, [facility]  If 7PM-7AM, please contact night-coverage [email or web address]   LOS: 1 day

## 2023-03-18 DIAGNOSIS — J189 Pneumonia, unspecified organism: Secondary | ICD-10-CM | POA: Diagnosis not present

## 2023-03-18 DIAGNOSIS — Z794 Long term (current) use of insulin: Secondary | ICD-10-CM

## 2023-03-18 DIAGNOSIS — C3491 Malignant neoplasm of unspecified part of right bronchus or lung: Secondary | ICD-10-CM | POA: Diagnosis not present

## 2023-03-18 DIAGNOSIS — E1122 Type 2 diabetes mellitus with diabetic chronic kidney disease: Secondary | ICD-10-CM

## 2023-03-18 DIAGNOSIS — N183 Chronic kidney disease, stage 3 unspecified: Secondary | ICD-10-CM

## 2023-03-18 DIAGNOSIS — J159 Unspecified bacterial pneumonia: Secondary | ICD-10-CM | POA: Diagnosis not present

## 2023-03-18 LAB — COMPREHENSIVE METABOLIC PANEL
ALT: 16 U/L (ref 0–44)
AST: 25 U/L (ref 15–41)
Albumin: 4 g/dL (ref 3.5–5.0)
Alkaline Phosphatase: 107 U/L (ref 38–126)
Anion gap: 9 (ref 5–15)
BUN: 27 mg/dL — ABNORMAL HIGH (ref 8–23)
CO2: 22 mmol/L (ref 22–32)
Calcium: 9.1 mg/dL (ref 8.9–10.3)
Chloride: 105 mmol/L (ref 98–111)
Creatinine, Ser: 1.75 mg/dL — ABNORMAL HIGH (ref 0.61–1.24)
GFR, Estimated: 39 mL/min — ABNORMAL LOW (ref >60)
Glucose, Bld: 337 mg/dL — ABNORMAL HIGH (ref 70–99)
Potassium: 5 mmol/L (ref 3.5–5.1)
Sodium: 136 mmol/L (ref 135–145)
Total Bilirubin: 0.3 mg/dL (ref 0.3–1.2)
Total Protein: 7.7 g/dL (ref 6.5–8.1)

## 2023-03-18 LAB — CBC
HCT: 39.8 % (ref 39.0–52.0)
Hemoglobin: 12.6 g/dL — ABNORMAL LOW (ref 13.0–17.0)
MCH: 31 pg (ref 26.0–34.0)
MCHC: 31.7 g/dL (ref 30.0–36.0)
MCV: 98 fL (ref 80.0–100.0)
Platelets: 294 10*3/uL (ref 150–400)
RBC: 4.06 MIL/uL — ABNORMAL LOW (ref 4.22–5.81)
RDW: 15.4 % (ref 11.5–15.5)
WBC: 16.9 10*3/uL — ABNORMAL HIGH (ref 4.0–10.5)
nRBC: 0 % (ref 0.0–0.2)

## 2023-03-18 LAB — GLUCOSE, CAPILLARY
Glucose-Capillary: 390 mg/dL — ABNORMAL HIGH (ref 70–99)
Glucose-Capillary: 435 mg/dL — ABNORMAL HIGH (ref 70–99)
Glucose-Capillary: 437 mg/dL — ABNORMAL HIGH (ref 70–99)

## 2023-03-18 MED ORDER — INSULIN GLARGINE-YFGN 100 UNIT/ML ~~LOC~~ SOLN
15.0000 [IU] | Freq: Every day | SUBCUTANEOUS | Status: DC
  Administered 2023-03-18: 15 [IU] via SUBCUTANEOUS
  Filled 2023-03-18: qty 0.15

## 2023-03-18 MED ORDER — IPRATROPIUM-ALBUTEROL 0.5-2.5 (3) MG/3ML IN SOLN
3.0000 mL | Freq: Two times a day (BID) | RESPIRATORY_TRACT | Status: DC
  Administered 2023-03-18 – 2023-03-19 (×2): 3 mL via RESPIRATORY_TRACT
  Filled 2023-03-18 (×2): qty 3

## 2023-03-18 MED ORDER — INSULIN ASPART 100 UNIT/ML IJ SOLN
6.0000 [IU] | Freq: Once | INTRAMUSCULAR | Status: AC
  Administered 2023-03-18: 6 [IU] via SUBCUTANEOUS

## 2023-03-18 MED ORDER — INSULIN ASPART 100 UNIT/ML IJ SOLN
0.0000 [IU] | Freq: Three times a day (TID) | INTRAMUSCULAR | Status: DC
  Administered 2023-03-18 – 2023-03-19 (×2): 15 [IU] via SUBCUTANEOUS
  Administered 2023-03-19: 3 [IU] via SUBCUTANEOUS

## 2023-03-18 MED ORDER — INSULIN ASPART 100 UNIT/ML IJ SOLN: 0.0000 [IU] | Freq: Every day | INTRAMUSCULAR | Status: DC

## 2023-03-18 NOTE — Progress Notes (Signed)
[facility]                                                                              Paul [name], is a 77 y.o. male, DOB - Nov 14, 1946, [id number] Admit date - 03/16/2023    Outpatient Primary MD for the patient is [facility], [facility]  LOS - 2  days  Chief Complaint  Patient presents with   Shortness of Breath   Cough       Brief summary   Patient is a 77 year old male with the lung CA, COPD, CHF, diabetes mellitus type 2, HTN presented with shortness of breath, cough, fatigue and weakness for a week.  Patient reported exertional shortness of breath, currently on chemotherapy for lung CA.  He was hospitalized in February for mid acquired pneumonia.  Patient also was noted to have wheezing CT PE study showed no evidence of PE however right lower lobe consolidation concerning for pulmonary fibrosis. Admitted for further workup  Assessment & Plan    Principal Problem:   Community acquired bacterial pneumonia, RLL -Improving, continue IV ceftriaxone, Zithromax -Recommended out of bed, encouraged mobility -Home O2 evaluation -Continue Hycodan cough syrup as needed for the cough  Active Problems: COPD exacerbation -Continue Brovana, Pulmicort, DuoNebs -Continue prednisone 50 mg daily     Type 2 diabetes mellitus with chronic kidney disease, with long-term current use of insulin (HCC) -Placed on moderate sliding scale insulin while inpatient -Obtain hemoglobin A1c -Resume Lantus, 15units qhs (his dose was decreased to 25 units due to hypoglycemia)   Chronic HFrEF 2D echo 04/2021 had shown EF 30 to 35%, G1 DD -Euvolemic, continue Coreg, does not seem to be on Lasix at home    [place] Stage IIIb adenocarcinoma, followed by Dr. Earlie Server Patient currently on consolidation with immunotherapy deficiency every month   CAD/HTN Continue aspirin, carvedilol and statin   Hypothyroidism Continue Synthroid, was recently increased to 125 mcg daily on  03/13/2023  -Outpatient follow-up with PCP  Obesity Estimated body mass index is 36.8 kg/m as calculated from the following:   Height as of this encounter: 5\' 8"  (1.727 m).   Weight as of this encounter: 109.8 kg.  Code Status: Full code DVT Prophylaxis:  enoxaparin (LOVENOX) injection 40 mg Start: 03/16/23 2200 SCDs Start: 03/16/23 2132   Level of Care: Level of care: Med-Surg Family Communication: Updated patient Disposition Plan:      Remains inpatient appropriate:   Possible DC home tomorrow if continues to improve   Procedures:    Consultants:     Antimicrobials:   Anti-infectives (From admission, onward)    Start     Dose/Rate Route Frequency Ordered Stop   03/17/23 2200  cefTRIAXone (ROCEPHIN) 2 g in sodium chloride 0.9 % 100 mL IVPB        2 g 200 mL/hr over 30 Minutes Intravenous Every 24 hours 03/16/23 2205     03/17/23 2200  azithromycin (ZITHROMAX) tablet 500 mg        500 mg Oral Daily 03/16/23 2205     03/16/23 2100  cefTRIAXone (ROCEPHIN) 1 g in sodium chloride 0.9 % 100 mL IVPB  1 g 200 mL/hr over 30 Minutes Intravenous  Once 03/16/23 2046 03/16/23 2204   03/16/23 2100  azithromycin (ZITHROMAX) 500 mg in sodium chloride 0.9 % 250 mL IVPB        500 mg 250 mL/hr over 60 Minutes Intravenous  Once 03/16/23 2046 03/16/23 2344          Medications  arformoterol  15 mcg Nebulization BID   aspirin  81 mg Oral Daily   atorvastatin  40 mg Oral Daily   azithromycin  500 mg Oral Daily   budesonide (PULMICORT) nebulizer solution  0.25 mg Nebulization BID   carvedilol  25 mg Oral QHS   enoxaparin (LOVENOX) injection  40 mg Subcutaneous Q24H   ipratropium-albuterol  3 mL Nebulization BID   levothyroxine  125 mcg Oral QAC breakfast   predniSONE  50 mg Oral Q breakfast      Subjective:   Paul [name] was seen and examined today.  Feeling somewhat better today still has some cough but improving.  Shortness of breath is improving.  No acute chest  pain, fevers or chills.    Objective:   Vitals:   03/17/23 2049 03/18/23 0107 03/18/23 0623 03/18/23 0748  BP: (!) 146/84 (!) 145/90 (!) 154/97   Pulse: 94 88 77   Resp: 19 18 17    Temp: 97.8 F (36.6 C) 97.7 F (36.5 C) (!) 97.4 F (36.3 C)   TempSrc: Oral Oral Oral   SpO2: 97% 94% 96% 97%  Weight:      Height:       No intake or output data in the 24 hours ending 03/18/23 1339   Wt Readings from Last 3 Encounters:  03/16/23 109.8 kg  03/12/23 112.1 kg  02/11/23 111 kg     Exam General: Alert and oriented x 3, NAD Cardiovascular: S1 S2 auscultated,  RRR Respiratory: Scattered wheezing with rhonchi bilaterally Gastrointestinal: Soft, nontender, nondistended, + bowel sounds Ext: no pedal edema bilaterally Neuro: no new deficits Psych: Normal affect and demeanor, alert and oriented x3     Data Reviewed:  I have personally reviewed following labs    CBC Lab Results  Component Value Date   WBC 7.8 03/16/2023   RBC 4.03 (L) 03/16/2023   HGB 12.5 (L) 03/16/2023   HCT 39.2 03/16/2023   MCV 97.3 03/16/2023   MCH 31.0 03/16/2023   PLT 283 03/16/2023   MCHC 31.9 03/16/2023   RDW 15.4 03/16/2023   LYMPHSABS 1.7 03/16/2023   MONOABS 0.8 03/16/2023   EOSABS 0.3 03/16/2023   BASOSABS 0.1 [id number]     Last metabolic panel Lab Results  Component Value Date   NA 141 03/16/2023   K 4.2 03/16/2023   CL 108 03/16/2023   CO2 27 03/16/2023   BUN 16 03/16/2023   CREATININE 1.43 (H) 03/16/2023   GLUCOSE 104 (H) 03/16/2023   GFRNONAA 50 (L) 03/16/2023   GFRAA 50 (L) 01/13/2019   CALCIUM 8.9 03/16/2023   PROT 7.8 03/12/2023   ALBUMIN 4.0 03/12/2023   BILITOT 0.5 03/12/2023   ALKPHOS 107 03/12/2023   AST 15 03/12/2023   ALT 8 03/12/2023   ANIONGAP 6 03/16/2023    CBG (last 3)  No results for input(s): "GLUCAP" in the last 72 hours.    Coagulation Profile: No results for input(s): "INR", "PROTIME" in the last 168 hours.   Radiology Studies: I have  personally reviewed the imaging studies  CT Angio Chest PE W and/or Wo Contrast  Result Date: 03/16/2023  CLINICAL DATA:  History of lung cancer and current chemotherapy, presenting with cough, weakness and shortness of breath. EXAM: CT ANGIOGRAPHY CHEST WITH CONTRAST TECHNIQUE: Multidetector CT imaging of the chest was performed using the standard protocol during bolus administration of intravenous contrast. Multiplanar CT image reconstructions and MIPs were obtained to evaluate the vascular anatomy. RADIATION DOSE REDUCTION: This exam was performed according to the departmental dose-optimization program which includes automated exposure control, adjustment of the mA and/or kV according to patient size and/or use of iterative reconstruction technique. CONTRAST:  61mL OMNIPAQUE IOHEXOL 350 MG/ML SOLN COMPARISON:  December 17, 2022 FINDINGS: Cardiovascular: Stable, marked severity atherosclerotic disease is seen throughout the aortic arch. Satisfactory opacification of the pulmonary arteries to the segmental level. There is tapering of the posterior right lower lobe branch of the right pulmonary artery, without opacification of its distal branches. There is mild cardiomegaly with mild to moderate severity coronary artery calcification. No pericardial effusion. Mediastinum/Nodes: Stable subcentimeter AP window and paratracheal lymph nodes are seen. Thyroid gland, trachea, and esophagus demonstrate no significant findings. Lungs/Pleura: Moderate severity areas of scarring, atelectasis and/or pulmonary fibrosis are seen within the peripheral aspect of the anterior right upper lobe, anteromedial right middle lobe and posteromedial right lower lobe. Marked severity consolidation and post treatment changes are again seen within the posteromedial aspect of the right lower lobe, from the perihilar region to the right lung base. Predominant stable moderate to marked severity left lower lobe fibrotic scarring and atelectasis  is noted. There is a small, partially loculated right pleural effusion. Upper Abdomen: No acute abnormality. Musculoskeletal: Multiple sternal wires are noted. No acute osseous abnormalities are identified. Review of the MIP images confirms the above findings. IMPRESSION: 1. No evidence of pulmonary embolism. 2. Marked severity right lower lobe consolidation and post treatment changes, as described above. 3. Small, partially loculated right pleural effusion. 4. Findings consistent with underlying pulmonary fibrosis with superimposed areas of abdominal right upper lobe and right middle lobe atelectasis and/or infiltrate. 5. Aortic atherosclerosis. Aortic Atherosclerosis (ICD10-I70.0). Electronically Signed   By: Virgina Norfolk M.D.   On: 03/16/2023 19:00   DG Chest 2 View  Result Date: 03/16/2023 CLINICAL DATA:  Shortness of breath. History of treated non-small-cell lung cancer EXAM: CHEST - 2 VIEW COMPARISON:  X-ray 03/12/2023.  CT 12/17/2022 and older FINDINGS: Status post median sternotomy with enlarged cardiopericardial silhouette. Tortuous ectatic aorta. Vascular congestion. Tiny pleural effusions. No pneumothorax. Overlapping cardiac leads. IMPRESSION: Postop chest. Enlarged cardiopericardial silhouette with some vascular congestion. Tiny pleural effusions. No pneumothorax Electronically Signed   By: Jill Side M.D.   On: 03/16/2023 14:27       Aryn Kops M.D. [facility] 03/18/2023, 1:39 PM  Available via Epic secure chat 7am-7pm After 7 pm, please refer to night coverage provider listed on amion.

## 2023-03-19 ENCOUNTER — Encounter: Payer: Self-pay | Admitting: Internal Medicine

## 2023-03-19 ENCOUNTER — Other Ambulatory Visit (HOSPITAL_COMMUNITY): Payer: Self-pay

## 2023-03-19 DIAGNOSIS — C3491 Malignant neoplasm of unspecified part of right bronchus or lung: Secondary | ICD-10-CM | POA: Diagnosis not present

## 2023-03-19 DIAGNOSIS — J189 Pneumonia, unspecified organism: Secondary | ICD-10-CM | POA: Diagnosis not present

## 2023-03-19 DIAGNOSIS — E1122 Type 2 diabetes mellitus with diabetic chronic kidney disease: Secondary | ICD-10-CM | POA: Diagnosis not present

## 2023-03-19 DIAGNOSIS — J159 Unspecified bacterial pneumonia: Secondary | ICD-10-CM | POA: Diagnosis not present

## 2023-03-19 LAB — BASIC METABOLIC PANEL
Anion gap: 9 (ref 5–15)
BUN: 33 mg/dL — ABNORMAL HIGH (ref 8–23)
CO2: 23 mmol/L (ref 22–32)
Calcium: 9.3 mg/dL (ref 8.9–10.3)
Chloride: 103 mmol/L (ref 98–111)
Creatinine, Ser: 1.7 mg/dL — ABNORMAL HIGH (ref 0.61–1.24)
GFR, Estimated: 41 mL/min — ABNORMAL LOW (ref >60)
Glucose, Bld: 412 mg/dL — ABNORMAL HIGH (ref 70–99)
Potassium: 4.8 mmol/L (ref 3.5–5.1)
Sodium: 135 mmol/L (ref 135–145)

## 2023-03-19 LAB — GLUCOSE, CAPILLARY
Glucose-Capillary: 491 mg/dL — ABNORMAL HIGH (ref 70–99)
Glucose-Capillary: 397 mg/dL — ABNORMAL HIGH (ref 70–99)
Glucose-Capillary: 351 mg/dL — ABNORMAL HIGH (ref 70–99)
Glucose-Capillary: 191 mg/dL — ABNORMAL HIGH (ref 70–99)

## 2023-03-19 LAB — HEMOGLOBIN A1C
Hgb A1c MFr Bld: 8.2 % — ABNORMAL HIGH (ref 4.8–5.6)
Mean Plasma Glucose: 189 mg/dL

## 2023-03-19 MED ORDER — BENZONATATE 200 MG PO CAPS: 200.0000 mg | ORAL_CAPSULE | Freq: Three times a day (TID) | ORAL | 0 refills | Status: DC | PRN

## 2023-03-19 MED ORDER — METFORMIN HCL 500 MG PO TABS
500.0000 mg | ORAL_TABLET | Freq: Two times a day (BID) | ORAL | 3 refills | Status: DC
  Filled 2023-03-19: qty 60, 30d supply, fill #0

## 2023-03-19 MED ORDER — FLUTICASONE FUROATE-VILANTEROL 200-25 MCG/ACT IN AEPB
1.0000 | INHALATION_SPRAY | Freq: Every day | RESPIRATORY_TRACT | 3 refills | Status: DC
  Filled 2023-03-19: qty 60, 30d supply, fill #0

## 2023-03-19 MED ORDER — PREDNISONE 20 MG PO TABS
40.0000 mg | ORAL_TABLET | Freq: Every day | ORAL | 0 refills | Status: DC
  Filled 2023-03-19: qty 8, 4d supply, fill #0

## 2023-03-19 MED ORDER — HYDROCODONE BIT-HOMATROP MBR 5-1.5 MG/5ML PO SOLN: 5.0000 mL | Freq: Four times a day (QID) | ORAL | 0 refills | Status: DC | PRN

## 2023-03-19 MED ORDER — PREDNISONE 20 MG PO TABS
40.0000 mg | ORAL_TABLET | Freq: Every day | ORAL | Status: DC
  Administered 2023-03-19: 40 mg via ORAL
  Filled 2023-03-19: qty 2

## 2023-03-19 MED ORDER — INSULIN ASPART 100 UNIT/ML IJ SOLN
8.0000 [IU] | Freq: Once | INTRAMUSCULAR | Status: AC
  Administered 2023-03-19: 8 [IU] via SUBCUTANEOUS

## 2023-03-19 MED ORDER — INSULIN GLARGINE-YFGN 100 UNIT/ML ~~LOC~~ SOLN
10.0000 [IU] | Freq: Once | SUBCUTANEOUS | Status: AC
  Administered 2023-03-19: 10 [IU] via SUBCUTANEOUS
  Filled 2023-03-19: qty 0.1

## 2023-03-19 MED ORDER — METFORMIN HCL 500 MG PO TABS: 500.0000 mg | ORAL_TABLET | Freq: Two times a day (BID) | ORAL | 3 refills | Status: DC

## 2023-03-19 MED ORDER — BENZONATATE 200 MG PO CAPS
200.0000 mg | ORAL_CAPSULE | Freq: Three times a day (TID) | ORAL | 0 refills | Status: DC | PRN
  Filled 2023-03-19: qty 60, 20d supply, fill #0

## 2023-03-19 MED ORDER — AZITHROMYCIN 500 MG PO TABS
500.0000 mg | ORAL_TABLET | Freq: Every day | ORAL | 0 refills | Status: DC
  Filled 2023-03-19: qty 5, 5d supply, fill #0

## 2023-03-19 MED ORDER — CEFPODOXIME PROXETIL 200 MG PO TABS
200.0000 mg | ORAL_TABLET | Freq: Two times a day (BID) | ORAL | 0 refills | Status: DC
  Filled 2023-03-19: qty 10, 5d supply, fill #0

## 2023-03-19 MED ORDER — FLUTICASONE-SALMETEROL 230-21 MCG/ACT IN AERO: 2.0000 | INHALATION_SPRAY | Freq: Two times a day (BID) | RESPIRATORY_TRACT | 12 refills | Status: DC

## 2023-03-19 MED ORDER — INSULIN ASPART 100 UNIT/ML IJ SOLN
4.0000 [IU] | Freq: Three times a day (TID) | INTRAMUSCULAR | Status: DC
  Administered 2023-03-19 (×2): 4 [IU] via SUBCUTANEOUS

## 2023-03-19 MED ORDER — HYDROCODONE BIT-HOMATROP MBR 5-1.5 MG/5ML PO SOLN
5.0000 mL | Freq: Four times a day (QID) | ORAL | 0 refills | Status: DC | PRN
  Filled 2023-03-19: qty 120, 6d supply, fill #0

## 2023-03-19 MED ORDER — AZITHROMYCIN 500 MG PO TABS: 500.0000 mg | ORAL_TABLET | Freq: Every day | ORAL | 0 refills | Status: AC

## 2023-03-19 MED ORDER — CEFPODOXIME PROXETIL 200 MG PO TABS: 200.0000 mg | ORAL_TABLET | Freq: Two times a day (BID) | ORAL | 0 refills | Status: AC

## 2023-03-19 MED ORDER — INSULIN GLARGINE-YFGN 100 UNIT/ML ~~LOC~~ SOLN
30.0000 [IU] | Freq: Every day | SUBCUTANEOUS | Status: DC
  Filled 2023-03-19: qty 0.3

## 2023-03-19 MED ORDER — INSULIN GLARGINE-AGLR 100 UNIT/ML ~~LOC~~ SOPN: 35.0000 [IU] | PEN_INJECTOR | Freq: Every day | SUBCUTANEOUS | Status: DC

## 2023-03-19 MED ORDER — PREDNISONE 20 MG PO TABS: 40.0000 mg | ORAL_TABLET | Freq: Every day | ORAL | 0 refills | Status: AC

## 2023-03-19 NOTE — Plan of Care (Signed)

## 2023-03-19 NOTE — TOC Progression Note (Signed)
Transition of Care [facility]) - Progression Note    Patient Details  Name: Paul [name] MRN: [id number] Date of Birth: 1946-05-27  [facility] of [facility]) CM/SW Contact  Purcell Mouton, RN Phone Number: 03/19/2023, 11:41 AM  Clinical Narrative:    Spoke with pt he is [place]. A call to [facility] transfer coordinator revealed pt has Medicare part A, Dr. Felton Clinton, [place] [phone] ext [phone].      Barriers to Discharge: Continued Medical Work up  Expected Discharge Plan and Services         Expected Discharge Date: 03/19/23                                     Social Determinants of Health (SDOH) Interventions SDOH Screenings   Food Insecurity: No Food Insecurity (03/17/2023)  Housing: Low Risk  (03/17/2023)  Transportation Needs: No Transportation Needs (03/17/2023)  Utilities: Not At Risk (03/17/2023)  Financial Resource Strain: Low Risk  (11/22/2021)  Social Connections: Moderately Isolated (11/22/2021)  Tobacco Use: High Risk (03/17/2023)    Readmission Risk Interventions     No data to display

## 2023-03-19 NOTE — Discharge Summary (Signed)
Physician Discharge Summary   Patient: Paul [name] MRN: [id number] DOB: 1946/02/21  Admit date:     03/16/2023  Discharge date: 03/19/23  Discharge Physician: Estill Cotta, MD    PCP: [name]   Recommendations at discharge:   Continue Zithromax 500 mg daily for 5 days Continue Vantin 200 mg twice daily for 5 days Recommended to increase glargine to 35 units daily Metformin 500 mg twice daily Started on Advair 230-21 2 puffs twice daily Prednisone 40 mg daily for 4 more days  Discharge Diagnoses:    Community acquired bacterial pneumonia   Adenocarcinoma of right lung, stage 3 (HCC)   GERD (gastroesophageal reflux disease)   Essential hypertension   Stage 3a chronic kidney disease (CKD) (HCC)   CAD, multiple vessel   Type 2 diabetes mellitus with chronic kidney disease, with long-term current use of insulin [facility])    Hospital Course: Patient is a 77 year old male with the lung CA, COPD, CHF, diabetes mellitus type 2, HTN presented with shortness of breath, cough, fatigue and weakness for a week.  Patient reported exertional shortness of breath, currently on chemotherapy for lung CA.  He was hospitalized in February for mid acquired pneumonia.  Patient also was noted to have wheezing CT PE study showed no evidence of PE however right lower lobe consolidation concerning for pulmonary fibrosis. Admitted for further workup  Assessment and Plan:   Community acquired bacterial pneumonia, RLL -Started on IV Zithromax and Rocephin, transitioned to oral Zithromax and cefpodoxime for 5 more days to complete full course -Home O2 evaluation completed, did not require any additional O2 supplementation -Continue Hycodan cough syrup as needed for the cough    COPD exacerbation Continue albuterol inhaler, added Advair Tapered prednisone to 40 mg daily for 4 days      Type 2 diabetes mellitus with chronic kidney disease, with long-term current use of insulin  (HCC) -Uncontrolled with hyperglycemia due to steroids Hemoglobin A1c 8.2 -Recommended to increase glargine to 35 units daily, metformin 500 mg twice daily, continue Jardiance      Chronic HFrEF 2D echo 04/2021 had shown EF 30 to 35%, G1 DD -Euvolemic, continue Coreg     NSCLCa Stage IIIb adenocarcinoma, followed by Dr. Earlie Server Patient currently on consolidation with immunotherapy deficiency every month   CAD/HTN Continue aspirin, carvedilol and statin   Hypothyroidism Continue Synthroid, was recently increased to 125 mcg daily on 03/13/2023  -Outpatient follow-up with PCP   Obesity Estimated body mass index is 36.8 kg/m as calculated from the following:   Height as of this encounter: 5\' 8"  (1.727 m).   Weight as of this encounter: 109.8 kg.        Pain control - [employer] Controlled Substance Reporting System database was reviewed. and patient was instructed, not to drive, operate heavy machinery, perform activities at heights, swimming or participation in water activities or provide baby-sitting services while on Pain, Sleep and Anxiety Medications; until their outpatient Physician has advised to do so again. Also recommended to not to take more than prescribed Pain, Sleep and Anxiety Medications.  Consultants:  Procedures performed:   Disposition: Home Diet recommendation:  Discharge Diet Orders (From admission, onward)     Start     Ordered   03/19/23 0000  Diet Carb Modified        03/19/23 1030           Carb modified diet DISCHARGE MEDICATION: Allergies as of 03/19/2023   No Known Allergies  Medication List     TAKE these medications    albuterol 108 (90 Base) MCG/ACT inhaler Commonly known as: VENTOLIN HFA Inhale 2 puffs into the lungs every 6 (six) hours as needed for wheezing or shortness of breath.   aspirin 81 MG chewable tablet Take 81 mg by mouth daily.   atorvastatin 40 MG tablet Commonly known as: LIPITOR Take 40 mg by  mouth daily.   azithromycin 500 MG tablet Commonly known as: ZITHROMAX Take 1 tablet (500 mg total) by mouth daily for 5 days.   benzonatate 200 MG capsule Commonly known as: TESSALON Take 1 capsule (200 mg total) by mouth 3 (three) times daily as needed for cough. What changed:  medication strength how much to take   blood glucose meter kit and supplies Kit Dispense based on patient and insurance preference. Use up to four times daily as directed.   carvedilol 25 MG tablet Commonly known as: COREG Take 25 mg by mouth at bedtime.   cefpodoxime 200 MG tablet Commonly known as: VANTIN Take 1 tablet (200 mg total) by mouth 2 (two) times daily for 5 days.   clobetasol cream 0.05 % Commonly known as: TEMOVATE Apply 1 Application topically 2 (two) times daily as needed (back pain).   empagliflozin 10 MG Tabs tablet Commonly known as: JARDIANCE Take 1 tablet by mouth daily.   fluticasone-salmeterol 230-21 MCG/ACT inhaler Commonly known as: Advair HFA Inhale 2 puffs into the lungs 2 (two) times daily.   HYDROcodone bit-homatropine 5-1.5 MG/5ML syrup Commonly known as: HYCODAN Take 5 mLs by mouth every 6 (six) hours as needed for cough.   Insulin Glargine-aglr 100 UNIT/ML Sopn Inject 35 Units into the skin at bedtime. What changed: how much to take   levothyroxine 125 MCG tablet Commonly known as: Synthroid Take 1 tablet (125 mcg total) by mouth daily before breakfast. What changed: Another medication with the same name was removed. Continue taking this medication, and follow the directions you see here.   lidocaine-prilocaine cream Commonly known as: EMLA Apply 1 application topically as needed. What changed:  when to take this reasons to take this   metFORMIN 500 MG tablet Commonly known as: GLUCOPHAGE Take 1 tablet (500 mg total) by mouth 2 (two) times daily with a meal. What changed: Another medication with the same name was removed. Continue taking this  medication, and follow the directions you see here.   multivitamin with minerals tablet Take 1 tablet by mouth daily.   Muscle Rub 10-15 % Crea Apply 1 application topically daily as needed for muscle pain.   nitroGLYCERIN 0.4 MG SL tablet Commonly known as: NITROSTAT Place 0.4 mg under the tongue every 5 (five) minutes as needed for chest pain.   predniSONE 20 MG tablet Commonly known as: DELTASONE Take 2 tablets (40 mg total) by mouth daily with breakfast for 4 days. Start taking on: March 20, 2023   prochlorperazine 10 MG tablet Commonly known as: COMPAZINE Take 1 tablet (10 mg total) by mouth every 6 (six) hours as needed for nausea or vomiting.   silver sulfADIAZINE 1 % cream Commonly known as: SILVADENE Apply 1 Application topically daily as needed (rash).   triamcinolone cream 0.1 % Commonly known as: KENALOG Apply 1 Application topically 2 (two) times daily as needed (rash).        Follow-up Information     Clinic, [place]. Schedule an appointment as soon as possible for a visit in 2 week(s).   Why: for hospital  follow-up Contact information: [place] [address] [phone]                Discharge Exam: Danley Danker Weights   03/16/23 1353  Weight: 109.8 kg   S: Feeling much better, wants to go home.  No fevers or chills.  Coughing has improved.  Wheezing is improving, feels that he can manage at home now   BP (!) 145/104 (BP Location: Left Arm)   Pulse 66   Temp (!) 97.5 F (36.4 C) (Oral)   Resp 18   Ht 5\' 8"  (1.727 m)   Wt 109.8 kg   SpO2 98%   BMI 36.80 kg/m    Physical Exam General: Alert and oriented x 3, NAD Cardiovascular: S1 S2 clear, RRR.  Respiratory: Bilateral scattered wheezing, improving  gastrointestinal: Soft, nontender, nondistended, NBS Ext: no pedal edema bilaterally Neuro: no new deficits Skin: No rashes Psych: Normal affect and demeanor, alert and oriented x3     Condition at discharge: fair  The results of significant diagnostics from this hospitalization (including imaging, microbiology, ancillary and laboratory) are listed below for reference.   Imaging Studies: CT Angio Chest PE W and/or Wo Contrast  Result Date: 03/16/2023 CLINICAL DATA:  History of lung cancer and current chemotherapy, presenting with cough, weakness and shortness of breath. EXAM: CT ANGIOGRAPHY CHEST WITH CONTRAST TECHNIQUE: Multidetector CT imaging of the chest was performed using the standard protocol during bolus administration of intravenous contrast. Multiplanar CT image reconstructions and MIPs were obtained to evaluate the vascular anatomy. RADIATION DOSE REDUCTION: This exam was performed according to the departmental dose-optimization program which includes automated exposure control, adjustment of the mA and/or kV according to patient size and/or use of iterative reconstruction technique. CONTRAST:  8mL OMNIPAQUE IOHEXOL 350 MG/ML SOLN COMPARISON:  December 17, 2022 FINDINGS: Cardiovascular: Stable, marked severity atherosclerotic disease is seen throughout the aortic arch. Satisfactory opacification of the pulmonary arteries to the segmental level. There is tapering of the posterior right lower lobe branch of the right pulmonary artery, without opacification of its distal branches. There is mild cardiomegaly with mild to moderate severity coronary artery calcification. No pericardial effusion. Mediastinum/Nodes: Stable subcentimeter AP window and paratracheal lymph nodes are seen. Thyroid gland, trachea, and esophagus demonstrate no significant findings. Lungs/Pleura: Moderate severity areas of scarring, atelectasis and/or pulmonary fibrosis are seen within the peripheral aspect of the anterior right upper lobe, anteromedial right middle lobe and posteromedial right lower lobe. Marked severity consolidation and post treatment changes are again seen within the posteromedial  aspect of the right lower lobe, from the perihilar region to the right lung base. Predominant stable moderate to marked severity left lower lobe fibrotic scarring and atelectasis is noted. There is a small, partially loculated right pleural effusion. Upper Abdomen: No acute abnormality. Musculoskeletal: Multiple sternal wires are noted. No acute osseous abnormalities are identified. Review of the MIP images confirms the above findings. IMPRESSION: 1. No evidence of pulmonary embolism. 2. Marked severity right lower lobe consolidation and post treatment changes, as described above. 3. Small, partially loculated right pleural effusion. 4. Findings consistent with underlying pulmonary fibrosis with superimposed areas of abdominal right upper lobe and right middle lobe atelectasis and/or infiltrate. 5. Aortic atherosclerosis. Aortic Atherosclerosis (ICD10-I70.0). Electronically Signed   By: Virgina Norfolk M.D.   On: 03/16/2023 19:00   DG Chest 2 View  Result Date: 03/16/2023 CLINICAL DATA:  Shortness of breath. History of treated non-small-cell lung cancer EXAM: CHEST - 2 VIEW COMPARISON:  X-ray 03/12/2023.  CT 12/17/2022 and older FINDINGS: Status post median sternotomy with enlarged cardiopericardial silhouette. Tortuous ectatic aorta. Vascular congestion. Tiny pleural effusions. No pneumothorax. Overlapping cardiac leads. IMPRESSION: Postop chest. Enlarged cardiopericardial silhouette with some vascular congestion. Tiny pleural effusions. No pneumothorax Electronically Signed   By: Jill Side M.D.   On: 03/16/2023 14:27   DG Chest 2 View  Result Date: 03/12/2023 CLINICAL DATA:  Worsening shortness of breath, history smoker, COPD, CHF, lung cancer stage III RIGHT lung adenocarcinoma EXAM: CHEST - 2 VIEW COMPARISON:  06/22/2021 Correlation: CT chest 12/17/2022 FINDINGS: Enlargement of cardiac silhouette post CABG. Pulmonary vascularity normal. Abnormal soft tissues RIGHT suprahilar and at RIGHT hilum.  Bibasilar atelectasis and small effusions greater on RIGHT. Remaining lungs clear. No pneumothorax or acute osseous findings. IMPRESSION: Bibasilar atelectasis and small effusions greater on RIGHT, increased since 12/17/2022. Enlargement of cardiac silhouette post CABG. Electronically Signed   By: Lavonia Dana M.D.   On: 03/12/2023 13:15    Microbiology: Results for orders placed or performed during the hospital encounter of 03/16/23  Blood culture (routine x 2)     Status: None (Preliminary result)   Collection Time: 03/16/23  9:08 PM   Specimen: BLOOD  Result Value Ref Range Status   Specimen Description   Final    BLOOD RIGHT ANTECUBITAL Performed at [facility] [address] [place], [place] [address]    Special Requests   Final    BOTTLES DRAWN AEROBIC AND ANAEROBIC Blood Culture results may not be optimal due to an excessive volume of blood received in culture bottles Performed at [facility] [address], [place], [place] [address]    Culture   Final    NO GROWTH 3 DAYS Performed at [facility], [place] [address], [place], [place] [address]    Report Status PENDING  Incomplete  Blood culture (routine x 2)     Status: None (Preliminary result)   Collection Time: 03/16/23  9:30 PM   Specimen: Site Not Specified; Blood  Result Value Ref Range Status   Specimen Description   Final    SITE NOT SPECIFIED Performed at [facility] [address] [place], [place] [address]    Special Requests   Final    BOTTLES DRAWN AEROBIC AND ANAEROBIC Blood Culture results may not be optimal due to an excessive volume of blood received in culture bottles Performed at [facility] [address] [place], [place] [address]    Culture   Final    NO GROWTH 3 DAYS Performed at [facility], [place] [address] [place], [place] [address]    Report Status PENDING  Incomplete    Labs: CBC: Recent Labs   Lab 03/16/23 1612 03/16/23 2345 03/18/23 1413  WBC 7.4 7.8 16.9*  NEUTROABS 4.6  --   --   HGB 12.3* 12.5* 12.6*  HCT 38.9* 39.2 39.8  MCV 98.7 97.3 98.0  PLT 261 283 [id number]   Basic Metabolic Panel: Recent Labs  Lab 03/16/23 1612 03/16/23 2345 03/18/23 1413 03/19/23 0422  NA 141  --  136 135  K 4.2  --  5.0 4.8  CL 108  --  105 103  CO2 27  --  22 23  GLUCOSE 104*  --  337* 412*  BUN 16  --  27* 33*  CREATININE 1.42* 1.43* 1.75* 1.70*  CALCIUM 8.9  --  9.1 9.3   Liver Function Tests:  Recent Labs  Lab 03/18/23 1413  AST 25  ALT 16  ALKPHOS 107  BILITOT 0.3  PROT 7.7  ALBUMIN 4.0   CBG: Recent Labs  Lab 03/18/23 2235 03/19/23 0207 03/19/23 0512 03/19/23 0801 03/19/23 1147  GLUCAP 437* 491* 397* 351* 191*    Discharge time spent: greater than 30 minutes.  Signed: Estill Cotta, MD [facility] 03/19/2023

## 2023-03-19 NOTE — Progress Notes (Signed)
SATURATION QUALIFICATIONS: (This note is used to comply with regulatory documentation for home oxygen)  Patient Saturations on Room Air at Rest = 98%  Patient Saturations on Room Air while Ambulating = 96%  Patient Saturations on 0 Liters of oxygen while Ambulating = 0%  Please briefly explain why patient needs home oxygen: Does not need portable oxygen.

## 2023-03-20 ENCOUNTER — Encounter: Payer: Self-pay | Admitting: Internal Medicine

## 2023-03-20 LAB — HEMOGLOBIN A1C
Hgb A1c MFr Bld: 8.3 % — ABNORMAL HIGH (ref 4.8–5.6)
Mean Plasma Glucose: 192 mg/dL

## 2023-03-21 LAB — CULTURE, BLOOD (ROUTINE X 2)
Culture: NO GROWTH
Culture: NO GROWTH

## 2023-03-26 ENCOUNTER — Ambulatory Visit (HOSPITAL_COMMUNITY): Admission: RE | Admit: 2023-03-26 | Payer: No Typology Code available for payment source | Source: Ambulatory Visit

## 2023-03-26 ENCOUNTER — Inpatient Hospital Stay: Payer: No Typology Code available for payment source

## 2023-03-28 NOTE — Progress Notes (Deleted)
[place] [address]  DIAGNOSIS: Stage IIIB (T3b, N2, M0) non-small cell lung cancer, adenocarcinoma presented with large right lower lobe lung mass and suspicious right lower paratracheal lymphadenopathy diagnosed in June 2022.   Molecular studies by [employer] showed no actionable mutations  PRIOR THERAPY: 1) Neoadjuvant systemic chemotherapy according to the Checkmate 816 with carboplatin for AUC of 5, Alimta 500 Mg/M2 and nivolumab 360 Mg IV every 3 weeks for 3 cycles.  Last dose 09/24/21.  Status post 3 cycles 2) Concurrent chemoradiation with carboplatin for an AUC of 2 and paclitaxel 45 mg per metered square.  First dose expected on 11/04/2021.  Status post 7 cycles. 3) Consolidation treatment with immunotherapy with Imfinzi 1500 Mg IV every 4 weeks.  Last dose on 03/12/23.  Status post 13 cycles.   CURRENT THERAPY: Observation   INTERVAL HISTORY: Paul [name] 77 y.o. male returns  to the clinic today for a follow-up visit. He has been hospitalized in February (CHF exacerbation, COPD/pneumonia) and March for COPD exacerbation. At his last appointment, we tried to arrange for him to see his cardiologist, pcp, and pulmonologist at the [facility] However, he was hospitalized from 3/18-3/21 before he was able to see them on an outpatient basis. Since being discharged, he was able to re-establish *** with ***. They are arranging for ***.   Since last being seen, his shortness of breath is ***. Smoking ***.   At his last appointment, he completed his last cycle of consolidation immunotherapy, which he had been tolerating well. Had discussed he denies any fever, chills, or night sweats.  Denies any sick contacts, sore throat, or nasal congestion.  He continues to smoke cigarettes.  He states that this will be his last pack of cigarettes.  Denies any chest pain or hemoptysis.  Denies any  nausea, vomiting, diarrhea, or constipation.  Denies any headache or visual changes.  Denies any rashes or skin changes except for the dry skin on the lower extremities.  Has some swelling but states it is better than it was in January. He recently had a restaging CT scan. He is here today for evaluation and to review his scan results and for a more detailed discussion about his current condition and next steps in his care.   MEDICAL HISTORY: Past Medical History:  Diagnosis Date   CHF (congestive heart failure) ([place])    Coronary artery disease    Diabetes mellitus without complication ([place])    History of radiation therapy    Right lung- 11/27/21-01/10/22- Dr. [name]   Hypertension    Hypothyroidism    Ischemic cardiomyopathy    Pneumonia    a long time ago   Sleep apnea    no longer uses a cpap    ALLERGIES:  has No Known Allergies.  MEDICATIONS:  Current Outpatient Medications  Medication Sig Dispense Refill   albuterol (VENTOLIN HFA) 108 (90 Base) MCG/ACT inhaler Inhale 2 puffs into the lungs every 6 (six) hours as needed for wheezing or shortness of breath. 8 g 2   aspirin 81 MG chewable tablet Take 81 mg by mouth daily.     atorvastatin (LIPITOR) 40 MG tablet Take 40 mg by mouth daily.     benzonatate (TESSALON) 200 MG capsule Take 1 capsule (200 mg total) by mouth 3 (three) times daily as needed for cough. 60 capsule 0   blood glucose meter kit and supplies KIT  Dispense based on patient and insurance preference. Use up to four times daily as directed. 1 each 0   carvedilol (COREG) 25 MG tablet Take 25 mg by mouth at bedtime.     clobetasol cream (TEMOVATE) [id number] % Apply 1 Application topically 2 (two) times daily as needed (back pain).     empagliflozin (JARDIANCE) 10 MG TABS tablet Take 1 tablet by mouth daily.     fluticasone-salmeterol (ADVAIR HFA) 230-21 MCG/ACT inhaler Inhale 2 puffs into the lungs 2 (two) times daily. 1 each 12   HYDROcodone bit-homatropine (HYCODAN)  5-1.5 MG/5ML syrup Take 5 mLs by mouth every 6 (six) hours as needed for cough. 120 mL 0   Insulin Glargine-aglr 100 UNIT/ML SOPN Inject 35 Units into the skin at bedtime.     levothyroxine (SYNTHROID) 125 MCG tablet Take 1 tablet (125 mcg total) by mouth daily before breakfast. 30 tablet 2   lidocaine-prilocaine (EMLA) cream Apply 1 application topically as needed. (Patient taking differently: Apply 1 application  topically daily as needed (back pain).) 30 g 2   Menthol-Methyl Salicylate (MUSCLE RUB) 10-15 % CREA Apply 1 application topically daily as needed for muscle pain.     metFORMIN (GLUCOPHAGE) 500 MG tablet Take 1 tablet (500 mg total) by mouth 2 (two) times daily with a meal. 60 tablet 3   Multiple Vitamins-Minerals (MULTIVITAMIN WITH MINERALS) tablet Take 1 tablet by mouth daily.     nitroGLYCERIN (NITROSTAT) 0.4 MG SL tablet Place 0.4 mg under the tongue every 5 (five) minutes as needed for chest pain.     prochlorperazine (COMPAZINE) 10 MG tablet Take 1 tablet (10 mg total) by mouth every 6 (six) hours as needed for nausea or vomiting. 30 tablet 0   silver sulfADIAZINE (SILVADENE) 1 % cream Apply 1 Application topically daily as needed (rash).     triamcinolone cream (KENALOG) 0.1 % Apply 1 Application topically 2 (two) times daily as needed (rash).     No current facility-administered medications for this visit.    SURGICAL HISTORY:  Past Surgical History:  Procedure Laterality Date   BRONCHIAL BIOPSY  06/04/2021   Procedure: BRONCHIAL BIOPSIES;  Surgeon: Garner Nash, DO;  Location: [facility];  Service: Pulmonary;;   BRONCHIAL BRUSHINGS  06/04/2021   Procedure: BRONCHIAL BRUSHINGS;  Surgeon: Garner Nash, DO;  Location: [facility];  Service: Pulmonary;;   BRONCHIAL NEEDLE ASPIRATION BIOPSY  06/04/2021   Procedure: BRONCHIAL NEEDLE ASPIRATION BIOPSIES;  Surgeon: Garner Nash, DO;  Location: [facility];  Service: Pulmonary;;   BRONCHIAL WASHINGS  06/04/2021    Procedure: BRONCHIAL WASHINGS;  Surgeon: Garner Nash, DO;  Location: [facility];  Service: Pulmonary;;   CARDIAC SURGERY     COLONOSCOPY     CORONARY ARTERY BYPASS GRAFT     2019 at [facility] Right 06/04/2021   Procedure: [place];  Surgeon: Garner Nash, DO;  Location: [facility];  Service: Pulmonary;  Laterality: Right;   VIDEO BRONCHOSCOPY WITH ENDOBRONCHIAL ULTRASOUND  06/04/2021   Procedure: VIDEO BRONCHOSCOPY WITH ENDOBRONCHIAL ULTRASOUND;  Surgeon: Garner Nash, DO;  Location: [facility];  Service: Pulmonary;;    REVIEW OF SYSTEMS:   Review of Systems  Constitutional: Negative for appetite change, chills, fatigue, fever and unexpected weight change.  HENT:   Negative for mouth sores, nosebleeds, sore throat and trouble swallowing.   Eyes: Negative for eye problems and icterus.  Respiratory: Negative for cough, hemoptysis, shortness  of breath and wheezing.   Cardiovascular: Negative for chest pain and leg swelling.  Gastrointestinal: Negative for abdominal pain, constipation, diarrhea, nausea and vomiting.  Genitourinary: Negative for bladder incontinence, difficulty urinating, dysuria, frequency and hematuria.   Musculoskeletal: Negative for back pain, gait problem, neck pain and neck stiffness.  Skin: Negative for itching and rash.  Neurological: Negative for dizziness, extremity weakness, gait problem, headaches, light-headedness and seizures.  Hematological: Negative for adenopathy. Does not bruise/bleed easily.  Psychiatric/Behavioral: Negative for confusion, depression and sleep disturbance. The patient is not nervous/anxious.     PHYSICAL EXAMINATION:  There were no vitals taken for this visit.  ECOG PERFORMANCE STATUS: {CHL ONC ECOG [id number]  Physical Exam  Constitutional: Oriented to person, place, and time and well-developed, well-nourished, and in no  distress. No distress.  HENT:  Head: Normocephalic and atraumatic.  Mouth/Throat: Oropharynx is clear and moist. No oropharyngeal exudate.  Eyes: Conjunctivae are normal. Right eye exhibits no discharge. Left eye exhibits no discharge. No scleral icterus.  Neck: Normal range of motion. Neck supple.  Cardiovascular: Normal rate, regular rhythm, normal heart sounds and intact distal pulses.   Pulmonary/Chest: Effort normal and breath sounds normal. No respiratory distress. No wheezes. No rales.  Abdominal: Soft. Bowel sounds are normal. Exhibits no distension and no mass. There is no tenderness.  Musculoskeletal: Normal range of motion. Exhibits no edema.  Lymphadenopathy:    No cervical adenopathy.  Neurological: Alert and oriented to person, place, and time. Exhibits normal muscle tone. Gait normal. Coordination normal.  Skin: Skin is warm and dry. No rash noted. Not diaphoretic. No erythema. No pallor.  Psychiatric: Mood, memory and judgment normal.  Vitals reviewed.  LABORATORY DATA: Lab Results  Component Value Date   WBC 16.9 (H) 03/18/2023   HGB 12.6 (L) 03/18/2023   HCT 39.8 03/18/2023   MCV 98.0 03/18/2023   PLT 294 03/18/2023      Chemistry      Component Value Date/Time   NA 135 03/19/2023 0422   K 4.8 03/19/2023 0422   CL 103 03/19/2023 0422   CO2 23 03/19/2023 0422   BUN 33 (H) 03/19/2023 0422   CREATININE 1.70 (H) 03/19/2023 0422   CREATININE 1.49 (H) 03/12/2023 0930      Component Value Date/Time   CALCIUM 9.3 03/19/2023 0422   ALKPHOS 107 03/18/2023 1413   AST 25 03/18/2023 1413   AST 15 03/12/2023 0930   ALT 16 03/18/2023 1413   ALT 8 03/12/2023 0930   BILITOT 0.3 03/18/2023 1413   BILITOT 0.5 03/12/2023 0930       RADIOGRAPHIC STUDIES:  CT Angio Chest PE W and/or Wo Contrast  Result Date: 03/16/2023 CLINICAL DATA:  History of lung cancer and current chemotherapy, presenting with cough, weakness and shortness of breath. EXAM: CT ANGIOGRAPHY CHEST  WITH CONTRAST TECHNIQUE: Multidetector CT imaging of the chest was performed using the standard protocol during bolus administration of intravenous contrast. Multiplanar CT image reconstructions and MIPs were obtained to evaluate the vascular anatomy. RADIATION DOSE REDUCTION: This exam was performed according to the departmental dose-optimization program which includes automated exposure control, adjustment of the mA and/or kV according to patient size and/or use of iterative reconstruction technique. CONTRAST:  72mL OMNIPAQUE IOHEXOL 350 MG/ML SOLN COMPARISON:  December 17, 2022 FINDINGS: Cardiovascular: Stable, marked severity atherosclerotic disease is seen throughout the aortic arch. Satisfactory opacification of the pulmonary arteries to the segmental level. There is tapering of the posterior right lower lobe branch of the right  pulmonary artery, without opacification of its distal branches. There is mild cardiomegaly with mild to moderate severity coronary artery calcification. No pericardial effusion. Mediastinum/Nodes: Stable subcentimeter AP window and paratracheal lymph nodes are seen. Thyroid gland, trachea, and esophagus demonstrate no significant findings. Lungs/Pleura: Moderate severity areas of scarring, atelectasis and/or pulmonary fibrosis are seen within the peripheral aspect of the anterior right upper lobe, anteromedial right middle lobe and posteromedial right lower lobe. Marked severity consolidation and post treatment changes are again seen within the posteromedial aspect of the right lower lobe, from the perihilar region to the right lung base. Predominant stable moderate to marked severity left lower lobe fibrotic scarring and atelectasis is noted. There is a small, partially loculated right pleural effusion. Upper Abdomen: No acute abnormality. Musculoskeletal: Multiple sternal wires are noted. No acute osseous abnormalities are identified. Review of the MIP images confirms the above  findings. IMPRESSION: 1. No evidence of pulmonary embolism. 2. Marked severity right lower lobe consolidation and post treatment changes, as described above. 3. Small, partially loculated right pleural effusion. 4. Findings consistent with underlying pulmonary fibrosis with superimposed areas of abdominal right upper lobe and right middle lobe atelectasis and/or infiltrate. 5. Aortic atherosclerosis. Aortic Atherosclerosis (ICD10-I70.0). Electronically Signed   By: Virgina Norfolk M.D.   On: 03/16/2023 19:00   DG Chest 2 View  Result Date: 03/16/2023 CLINICAL DATA:  Shortness of breath. History of treated non-small-cell lung cancer EXAM: CHEST - 2 VIEW COMPARISON:  X-ray 03/12/2023.  CT 12/17/2022 and older FINDINGS: Status post median sternotomy with enlarged cardiopericardial silhouette. Tortuous ectatic aorta. Vascular congestion. Tiny pleural effusions. No pneumothorax. Overlapping cardiac leads. IMPRESSION: Postop chest. Enlarged cardiopericardial silhouette with some vascular congestion. Tiny pleural effusions. No pneumothorax Electronically Signed   By: Jill Side M.D.   On: 03/16/2023 14:27   DG Chest 2 View  Result Date: 03/12/2023 CLINICAL DATA:  Worsening shortness of breath, history smoker, COPD, CHF, lung cancer stage III RIGHT lung adenocarcinoma EXAM: CHEST - 2 VIEW COMPARISON:  06/22/2021 Correlation: CT chest 12/17/2022 FINDINGS: Enlargement of cardiac silhouette post CABG. Pulmonary vascularity normal. Abnormal soft tissues RIGHT suprahilar and at RIGHT hilum. Bibasilar atelectasis and small effusions greater on RIGHT. Remaining lungs clear. No pneumothorax or acute osseous findings. IMPRESSION: Bibasilar atelectasis and small effusions greater on RIGHT, increased since 12/17/2022. Enlargement of cardiac silhouette post CABG. Electronically Signed   By: Lavonia Dana M.D.   On: 03/12/2023 13:15     ASSESSMENT/PLAN:  This is a very pleasant 77 year old African-American male diagnosed  with stage IIIb (T3b, N2, M0) non-small cell lung cancer, adenocarcinoma.  He presented with a large right upper lobe lung mass and suspicious right lower lobe paratracheal lymphadenopathy.  He was diagnosed in June 2022.  His molecular studies by [employer] were negative for any actionable mutations.    The patient completed 3 cycles of neoadjuvant chemotherapy according to the Checkmate 816 trial with carboplatin for an AUC of 5, Alimta 500 mg per metered square, and nivolumab 360 mg IV every 3 weeks.  The patient status post 3 cycles.  His last dose was on 09/24/2021.  Upon reimaging after completion of this course, there was not significant improvement in the size of his mass and therefore, it does not appear that we are able to downstage his disease to make him eligible for surgical resection.    He completed a course of concurrent chemoradiation with weekly carboplatin for AUC of 2 and paclitaxel 45 Mg/M2 status post 7 cycles.  Last cycle was given January 02, 2022.     The patient recently completed consolidation treatment with immunotherapy with Imfinzi 1500 Mg IV every 4 weeks status post 13 cycles. His last does of treatment was on 03/12/23.   The patient recently had a restaging CT scan performed. Dr. Julien Nordmann personally and independently reviewed the scan and discussed the results with the patient. The scan showed ***.   Dr. Julien Nordmann recommends that the patient continue on observation with a repeat CT scan in 3 months.   Port?  Strongly encouraged to quit smoking.   See PCP, pulm, and cardiology at [facility].   The patient was advised to call immediately if she has any concerning symptoms in the interval. The patient voices understanding of current disease status and treatment options and is in agreement with the current care plan. All questions were answered. The patient knows to call the clinic with any problems, questions or concerns. We can certainly see the patient much sooner if  necessary     No orders of the defined types were placed in this encounter.    I spent {CHL ONC TIME VISIT - [id number] counseling the patient face to face. The total time spent in the appointment was {CHL ONC TIME VISIT - [id number].  Carolyna Yerian L Maralee Higuchi, PA-C 03/28/23

## 2023-03-30 ENCOUNTER — Telehealth: Payer: Self-pay

## 2023-03-30 ENCOUNTER — Inpatient Hospital Stay: Payer: Non-veteran care | Attending: Internal Medicine | Admitting: Physician Assistant

## 2023-03-30 NOTE — Telephone Encounter (Signed)
This nurse attempted to reach patient related to missed appointment for today.  No answer.  Will attempt to reach patient again for rescheduling of office visit.

## 2023-03-31 ENCOUNTER — Other Ambulatory Visit: Payer: Self-pay

## 2023-04-01 ENCOUNTER — Telehealth: Payer: Self-pay | Admitting: Physician Assistant

## 2023-04-01 NOTE — Telephone Encounter (Signed)
2nd attempt calling patient per 4/2 IB message to reschedule missed appointments. Left voicemail with new appointment information and contact details if needing to reschedule.

## 2023-04-02 NOTE — Progress Notes (Deleted)
[place] [address]  DIAGNOSIS: Stage IIIB (T3b, N2, M0) non-small cell lung cancer, adenocarcinoma presented with large right lower lobe lung mass and suspicious right lower paratracheal lymphadenopathy diagnosed in June 2022.   Molecular studies by [employer] showed no actionable mutations  PRIOR THERAPY: 1) Neoadjuvant systemic chemotherapy according to the Checkmate 816 with carboplatin for AUC of 5, Alimta 500 Mg/M2 and nivolumab 360 Mg IV every 3 weeks for 3 cycles.  Last dose 09/24/21.  Status post 3 cycles 2) Concurrent chemoradiation with carboplatin for an AUC of 2 and paclitaxel 45 mg per metered square.  First dose expected on 11/04/2021.  Status post 7 cycles. 3) Consolidation treatment with immunotherapy with Imfinzi 1500 Mg IV every 4 weeks.  Last dose on 03/12/23.  Status post 13 cycles.   CURRENT THERAPY: Observation   INTERVAL HISTORY: Paul [name] 77 y.o. male returns to the clinic today for a follow-up visit. He has been hospitalized in February (CHF exacerbation, COPD/pneumonia) and March for COPD exacerbation. At his last appointment, we tried to arrange for him to see his cardiologist, pcp, and pulmonologist at the [facility]. However, he was hospitalized from 3/18-3/21 before he was able to see them on an outpatient basis. Since being discharged, he was able to re-establish *** with ***. They are arranging for ***.   Since last being seen, his shortness of breath is ***. Smoking ***.   At his last appointment, he completed his last cycle of consolidation immunotherapy, which he had been tolerating well. Had discussed he denies any fever, chills, or night sweats.  Denies any sick contacts, sore throat, or nasal congestion.  He continues to smoke cigarettes.  He states that this will be his last pack of cigarettes.  Denies any chest pain or hemoptysis.  Denies any nausea,  vomiting, diarrhea, or constipation.  Denies any headache or visual changes.  Denies any rashes or skin changes except for the dry skin on the lower extremities.  Has some swelling but states it is better than it was in January. He recently had a restaging CT scan. He is here today for evaluation and to review his scan results and for a more detailed discussion about his current condition and next steps in his care.   MEDICAL HISTORY: Past Medical History:  Diagnosis Date   CHF (congestive heart failure) ([place])    Coronary artery disease    Diabetes mellitus without complication ([place])    History of radiation therapy    Right lung- 11/27/21-01/10/22- Dr. [name]   Hypertension    Hypothyroidism    Ischemic cardiomyopathy    Pneumonia    a long time ago   Sleep apnea    no longer uses a cpap    ALLERGIES:  has No Known Allergies.  MEDICATIONS:  Current Outpatient Medications  Medication Sig Dispense Refill   albuterol (VENTOLIN HFA) 108 (90 Base) MCG/ACT inhaler Inhale 2 puffs into the lungs every 6 (six) hours as needed for wheezing or shortness of breath. 8 g 2   aspirin 81 MG chewable tablet Take 81 mg by mouth daily.     atorvastatin (LIPITOR) 40 MG tablet Take 40 mg by mouth daily.     benzonatate (TESSALON) 200 MG capsule Take 1 capsule (200 mg total) by mouth 3 (three) times daily as needed for cough. 60 capsule 0   blood glucose meter kit and supplies KIT Dispense  based on patient and insurance preference. Use up to four times daily as directed. 1 each 0   carvedilol (COREG) 25 MG tablet Take 25 mg by mouth at bedtime.     clobetasol cream (TEMOVATE) [id number] % Apply 1 Application topically 2 (two) times daily as needed (back pain).     empagliflozin (JARDIANCE) 10 MG TABS tablet Take 1 tablet by mouth daily.     fluticasone-salmeterol (ADVAIR HFA) 230-21 MCG/ACT inhaler Inhale 2 puffs into the lungs 2 (two) times daily. 1 each 12   HYDROcodone bit-homatropine (HYCODAN) 5-1.5  MG/5ML syrup Take 5 mLs by mouth every 6 (six) hours as needed for cough. 120 mL 0   Insulin Glargine-aglr 100 UNIT/ML SOPN Inject 35 Units into the skin at bedtime.     levothyroxine (SYNTHROID) 125 MCG tablet Take 1 tablet (125 mcg total) by mouth daily before breakfast. 30 tablet 2   lidocaine-prilocaine (EMLA) cream Apply 1 application topically as needed. (Patient taking differently: Apply 1 application  topically daily as needed (back pain).) 30 g 2   Menthol-Methyl Salicylate (MUSCLE RUB) 10-15 % CREA Apply 1 application topically daily as needed for muscle pain.     metFORMIN (GLUCOPHAGE) 500 MG tablet Take 1 tablet (500 mg total) by mouth 2 (two) times daily with a meal. 60 tablet 3   Multiple Vitamins-Minerals (MULTIVITAMIN WITH MINERALS) tablet Take 1 tablet by mouth daily.     nitroGLYCERIN (NITROSTAT) 0.4 MG SL tablet Place 0.4 mg under the tongue every 5 (five) minutes as needed for chest pain.     prochlorperazine (COMPAZINE) 10 MG tablet Take 1 tablet (10 mg total) by mouth every 6 (six) hours as needed for nausea or vomiting. 30 tablet 0   silver sulfADIAZINE (SILVADENE) 1 % cream Apply 1 Application topically daily as needed (rash).     triamcinolone cream (KENALOG) 0.1 % Apply 1 Application topically 2 (two) times daily as needed (rash).     No current facility-administered medications for this visit.    SURGICAL HISTORY:  Past Surgical History:  Procedure Laterality Date   BRONCHIAL BIOPSY  06/04/2021   Procedure: BRONCHIAL BIOPSIES;  Surgeon: Garner Nash, DO;  Location: [facility];  Service: Pulmonary;;   BRONCHIAL BRUSHINGS  06/04/2021   Procedure: BRONCHIAL BRUSHINGS;  Surgeon: Garner Nash, DO;  Location: [facility];  Service: Pulmonary;;   BRONCHIAL NEEDLE ASPIRATION BIOPSY  06/04/2021   Procedure: BRONCHIAL NEEDLE ASPIRATION BIOPSIES;  Surgeon: Garner Nash, DO;  Location: [facility];  Service: Pulmonary;;   BRONCHIAL WASHINGS  06/04/2021   Procedure:  BRONCHIAL WASHINGS;  Surgeon: Garner Nash, DO;  Location: [facility];  Service: Pulmonary;;   CARDIAC SURGERY     COLONOSCOPY     CORONARY ARTERY BYPASS GRAFT     2019 at [facility] Right 06/04/2021   Procedure: Mar-Mac;  Surgeon: Garner Nash, DO;  Location: [facility];  Service: Pulmonary;  Laterality: Right;   VIDEO BRONCHOSCOPY WITH ENDOBRONCHIAL ULTRASOUND  06/04/2021   Procedure: VIDEO BRONCHOSCOPY WITH ENDOBRONCHIAL ULTRASOUND;  Surgeon: Garner Nash, DO;  Location: [facility];  Service: Pulmonary;;    REVIEW OF SYSTEMS:   Review of Systems  Constitutional: Negative for appetite change, chills, fatigue, fever and unexpected weight change.  HENT:   Negative for mouth sores, nosebleeds, sore throat and trouble swallowing.   Eyes: Negative for eye problems and icterus.  Respiratory: Negative for cough, hemoptysis, shortness of  breath and wheezing.   Cardiovascular: Negative for chest pain and leg swelling.  Gastrointestinal: Negative for abdominal pain, constipation, diarrhea, nausea and vomiting.  Genitourinary: Negative for bladder incontinence, difficulty urinating, dysuria, frequency and hematuria.   Musculoskeletal: Negative for back pain, gait problem, neck pain and neck stiffness.  Skin: Negative for itching and rash.  Neurological: Negative for dizziness, extremity weakness, gait problem, headaches, light-headedness and seizures.  Hematological: Negative for adenopathy. Does not bruise/bleed easily.  Psychiatric/Behavioral: Negative for confusion, depression and sleep disturbance. The patient is not nervous/anxious.     PHYSICAL EXAMINATION:  There were no vitals taken for this visit.  ECOG PERFORMANCE STATUS: {CHL ONC ECOG [id number]  Physical Exam  Constitutional: Oriented to person, place, and time and well-developed, well-nourished, and in no distress. No  distress.  HENT:  Head: Normocephalic and atraumatic.  Mouth/Throat: Oropharynx is clear and moist. No oropharyngeal exudate.  Eyes: Conjunctivae are normal. Right eye exhibits no discharge. Left eye exhibits no discharge. No scleral icterus.  Neck: Normal range of motion. Neck supple.  Cardiovascular: Normal rate, regular rhythm, normal heart sounds and intact distal pulses.   Pulmonary/Chest: Effort normal and breath sounds normal. No respiratory distress. No wheezes. No rales.  Abdominal: Soft. Bowel sounds are normal. Exhibits no distension and no mass. There is no tenderness.  Musculoskeletal: Normal range of motion. Exhibits no edema.  Lymphadenopathy:    No cervical adenopathy.  Neurological: Alert and oriented to person, place, and time. Exhibits normal muscle tone. Gait normal. Coordination normal.  Skin: Skin is warm and dry. No rash noted. Not diaphoretic. No erythema. No pallor.  Psychiatric: Mood, memory and judgment normal.  Vitals reviewed.  LABORATORY DATA: Lab Results  Component Value Date   WBC 16.9 (H) 03/18/2023   HGB 12.6 (L) 03/18/2023   HCT 39.8 03/18/2023   MCV 98.0 03/18/2023   PLT 294 03/18/2023      Chemistry      Component Value Date/Time   NA 135 03/19/2023 0422   K 4.8 03/19/2023 0422   CL 103 03/19/2023 0422   CO2 23 03/19/2023 0422   BUN 33 (H) 03/19/2023 0422   CREATININE 1.70 (H) 03/19/2023 0422   CREATININE 1.49 (H) 03/12/2023 0930      Component Value Date/Time   CALCIUM 9.3 03/19/2023 0422   ALKPHOS 107 03/18/2023 1413   AST 25 03/18/2023 1413   AST 15 03/12/2023 0930   ALT 16 03/18/2023 1413   ALT 8 03/12/2023 0930   BILITOT 0.3 03/18/2023 1413   BILITOT 0.5 03/12/2023 0930       RADIOGRAPHIC STUDIES:  CT Angio Chest PE W and/or Wo Contrast  Result Date: 03/16/2023 CLINICAL DATA:  History of lung cancer and current chemotherapy, presenting with cough, weakness and shortness of breath. EXAM: CT ANGIOGRAPHY CHEST WITH CONTRAST  TECHNIQUE: Multidetector CT imaging of the chest was performed using the standard protocol during bolus administration of intravenous contrast. Multiplanar CT image reconstructions and MIPs were obtained to evaluate the vascular anatomy. RADIATION DOSE REDUCTION: This exam was performed according to the departmental dose-optimization program which includes automated exposure control, adjustment of the mA and/or kV according to patient size and/or use of iterative reconstruction technique. CONTRAST:  29mL OMNIPAQUE IOHEXOL 350 MG/ML SOLN COMPARISON:  December 17, 2022 FINDINGS: Cardiovascular: Stable, marked severity atherosclerotic disease is seen throughout the aortic arch. Satisfactory opacification of the pulmonary arteries to the segmental level. There is tapering of the posterior right lower lobe branch of the right pulmonary  artery, without opacification of its distal branches. There is mild cardiomegaly with mild to moderate severity coronary artery calcification. No pericardial effusion. Mediastinum/Nodes: Stable subcentimeter AP window and paratracheal lymph nodes are seen. Thyroid gland, trachea, and esophagus demonstrate no significant findings. Lungs/Pleura: Moderate severity areas of scarring, atelectasis and/or pulmonary fibrosis are seen within the peripheral aspect of the anterior right upper lobe, anteromedial right middle lobe and posteromedial right lower lobe. Marked severity consolidation and post treatment changes are again seen within the posteromedial aspect of the right lower lobe, from the perihilar region to the right lung base. Predominant stable moderate to marked severity left lower lobe fibrotic scarring and atelectasis is noted. There is a small, partially loculated right pleural effusion. Upper Abdomen: No acute abnormality. Musculoskeletal: Multiple sternal wires are noted. No acute osseous abnormalities are identified. Review of the MIP images confirms the above findings.  IMPRESSION: 1. No evidence of pulmonary embolism. 2. Marked severity right lower lobe consolidation and post treatment changes, as described above. 3. Small, partially loculated right pleural effusion. 4. Findings consistent with underlying pulmonary fibrosis with superimposed areas of abdominal right upper lobe and right middle lobe atelectasis and/or infiltrate. 5. Aortic atherosclerosis. Aortic Atherosclerosis (ICD10-I70.0). Electronically Signed   By: Virgina Norfolk M.D.   On: 03/16/2023 19:00   DG Chest 2 View  Result Date: 03/16/2023 CLINICAL DATA:  Shortness of breath. History of treated non-small-cell lung cancer EXAM: CHEST - 2 VIEW COMPARISON:  X-ray 03/12/2023.  CT 12/17/2022 and older FINDINGS: Status post median sternotomy with enlarged cardiopericardial silhouette. Tortuous ectatic aorta. Vascular congestion. Tiny pleural effusions. No pneumothorax. Overlapping cardiac leads. IMPRESSION: Postop chest. Enlarged cardiopericardial silhouette with some vascular congestion. Tiny pleural effusions. No pneumothorax Electronically Signed   By: Jill Side M.D.   On: 03/16/2023 14:27   DG Chest 2 View  Result Date: 03/12/2023 CLINICAL DATA:  Worsening shortness of breath, history smoker, COPD, CHF, lung cancer stage III RIGHT lung adenocarcinoma EXAM: CHEST - 2 VIEW COMPARISON:  06/22/2021 Correlation: CT chest 12/17/2022 FINDINGS: Enlargement of cardiac silhouette post CABG. Pulmonary vascularity normal. Abnormal soft tissues RIGHT suprahilar and at RIGHT hilum. Bibasilar atelectasis and small effusions greater on RIGHT. Remaining lungs clear. No pneumothorax or acute osseous findings. IMPRESSION: Bibasilar atelectasis and small effusions greater on RIGHT, increased since 12/17/2022. Enlargement of cardiac silhouette post CABG. Electronically Signed   By: Lavonia Dana M.D.   On: 03/12/2023 13:15     ASSESSMENT/PLAN:  This is a very pleasant 77 year old African-American male diagnosed with stage  IIIb (T3b, N2, M0) non-small cell lung cancer, adenocarcinoma.  He presented with a large right upper lobe lung mass and suspicious right lower lobe paratracheal lymphadenopathy.  He was diagnosed in June 2022.  His molecular studies by [employer] were negative for any actionable mutations.    The patient completed 3 cycles of neoadjuvant chemotherapy according to the Checkmate 816 trial with carboplatin for an AUC of 5, Alimta 500 mg per metered square, and nivolumab 360 mg IV every 3 weeks.  The patient status post 3 cycles.  His last dose was on 09/24/2021.  Upon reimaging after completion of this course, there was not significant improvement in the size of his mass and therefore, it does not appear that we are able to downstage his disease to make him eligible for surgical resection.    He completed a course of concurrent chemoradiation with weekly carboplatin for AUC of 2 and paclitaxel 45 Mg/M2 status post 7 cycles.  Last cycle was given January 02, 2022.     The patient recently completed consolidation treatment with immunotherapy with Imfinzi 1500 Mg IV every 4 weeks status post 13 cycles. His last does of treatment was on 03/12/23.   The patient recently had a restaging CT scan performed. Dr. Julien Nordmann personally and independently reviewed the scan and discussed the results with the patient. The scan showed ***.   Dr. Julien Nordmann recommends that the patient continue on observation with a repeat CT scan in 3 months.   [place]?  Strongly encouraged to quit smoking.   See PCP, pulm, and cardiology at [facility]   The patient was advised to call immediately if she has any concerning symptoms in the interval. The patient voices understanding of current disease status and treatment options and is in agreement with the current care plan. All questions were answered. The patient knows to call the clinic with any problems, questions or concerns. We can certainly see the patient much sooner if necessary   No  orders of the defined types were placed in this encounter.    I spent {CHL ONC TIME VISIT - [id number] counseling the patient face to face. The total time spent in the appointment was {CHL ONC TIME VISIT - [id number].  Seini Lannom L Anselmo Reihl, PA-C 04/02/23

## 2023-04-07 ENCOUNTER — Encounter: Payer: Self-pay | Admitting: Internal Medicine

## 2023-04-07 ENCOUNTER — Encounter (HOSPITAL_COMMUNITY): Payer: Self-pay

## 2023-04-07 ENCOUNTER — Emergency Department (HOSPITAL_COMMUNITY): Payer: No Typology Code available for payment source

## 2023-04-07 ENCOUNTER — Other Ambulatory Visit: Payer: Self-pay

## 2023-04-07 ENCOUNTER — Telehealth: Payer: Self-pay

## 2023-04-07 ENCOUNTER — Inpatient Hospital Stay (HOSPITAL_COMMUNITY)
Admission: EM | Admit: 2023-04-07 | Discharge: 2023-04-14 | DRG: 637 | Disposition: A | Discharge status: Skilled Nursing Facility | Payer: Medicare Other | Attending: Internal Medicine | Admitting: Internal Medicine

## 2023-04-07 ENCOUNTER — Inpatient Hospital Stay: Payer: Non-veteran care | Admitting: Physician Assistant

## 2023-04-07 ENCOUNTER — Inpatient Hospital Stay: Payer: Non-veteran care

## 2023-04-07 DIAGNOSIS — Z794 Long term (current) use of insulin: Secondary | ICD-10-CM

## 2023-04-07 DIAGNOSIS — Z85118 Personal history of other malignant neoplasm of bronchus and lung: Secondary | ICD-10-CM

## 2023-04-07 DIAGNOSIS — E785 Hyperlipidemia, unspecified: Secondary | ICD-10-CM | POA: Diagnosis present

## 2023-04-07 DIAGNOSIS — I255 Ischemic cardiomyopathy: Secondary | ICD-10-CM | POA: Diagnosis present

## 2023-04-07 DIAGNOSIS — Z79890 Hormone replacement therapy (postmenopausal): Secondary | ICD-10-CM

## 2023-04-07 DIAGNOSIS — N179 Acute kidney failure, unspecified: Secondary | ICD-10-CM | POA: Diagnosis present

## 2023-04-07 DIAGNOSIS — D649 Anemia, unspecified: Secondary | ICD-10-CM | POA: Diagnosis present

## 2023-04-07 DIAGNOSIS — N1831 Chronic kidney disease, stage 3a: Secondary | ICD-10-CM | POA: Diagnosis present

## 2023-04-07 DIAGNOSIS — G9341 Metabolic encephalopathy: Secondary | ICD-10-CM | POA: Diagnosis present

## 2023-04-07 DIAGNOSIS — E11649 Type 2 diabetes mellitus with hypoglycemia without coma: Secondary | ICD-10-CM | POA: Diagnosis not present

## 2023-04-07 DIAGNOSIS — R739 Hyperglycemia, unspecified: Secondary | ICD-10-CM | POA: Diagnosis not present

## 2023-04-07 DIAGNOSIS — Z6837 Body mass index (BMI) 37.0-37.9, adult: Secondary | ICD-10-CM

## 2023-04-07 DIAGNOSIS — I5022 Chronic systolic (congestive) heart failure: Secondary | ICD-10-CM | POA: Diagnosis present

## 2023-04-07 DIAGNOSIS — E1122 Type 2 diabetes mellitus with diabetic chronic kidney disease: Secondary | ICD-10-CM | POA: Diagnosis present

## 2023-04-07 DIAGNOSIS — Z951 Presence of aortocoronary bypass graft: Secondary | ICD-10-CM

## 2023-04-07 DIAGNOSIS — Z79899 Other long term (current) drug therapy: Secondary | ICD-10-CM

## 2023-04-07 DIAGNOSIS — E039 Hypothyroidism, unspecified: Secondary | ICD-10-CM | POA: Diagnosis present

## 2023-04-07 DIAGNOSIS — E1165 Type 2 diabetes mellitus with hyperglycemia: Secondary | ICD-10-CM | POA: Diagnosis present

## 2023-04-07 DIAGNOSIS — D72819 Decreased white blood cell count, unspecified: Secondary | ICD-10-CM | POA: Diagnosis present

## 2023-04-07 DIAGNOSIS — C3491 Malignant neoplasm of unspecified part of right bronchus or lung: Secondary | ICD-10-CM | POA: Diagnosis present

## 2023-04-07 DIAGNOSIS — Z7982 Long term (current) use of aspirin: Secondary | ICD-10-CM

## 2023-04-07 DIAGNOSIS — F17210 Nicotine dependence, cigarettes, uncomplicated: Secondary | ICD-10-CM | POA: Diagnosis present

## 2023-04-07 DIAGNOSIS — K219 Gastro-esophageal reflux disease without esophagitis: Secondary | ICD-10-CM | POA: Diagnosis present

## 2023-04-07 DIAGNOSIS — J9601 Acute respiratory failure with hypoxia: Secondary | ICD-10-CM | POA: Diagnosis present

## 2023-04-07 DIAGNOSIS — J9811 Atelectasis: Secondary | ICD-10-CM | POA: Diagnosis present

## 2023-04-07 DIAGNOSIS — I130 Hypertensive heart and chronic kidney disease with heart failure and stage 1 through stage 4 chronic kidney disease, or unspecified chronic kidney disease: Secondary | ICD-10-CM | POA: Diagnosis present

## 2023-04-07 DIAGNOSIS — E1100 Type 2 diabetes mellitus with hyperosmolarity without nonketotic hyperglycemic-hyperosmolar coma (NKHHC): Secondary | ICD-10-CM | POA: Diagnosis present

## 2023-04-07 DIAGNOSIS — Z7951 Long term (current) use of inhaled steroids: Secondary | ICD-10-CM

## 2023-04-07 DIAGNOSIS — J449 Chronic obstructive pulmonary disease, unspecified: Secondary | ICD-10-CM | POA: Diagnosis present

## 2023-04-07 DIAGNOSIS — Z923 Personal history of irradiation: Secondary | ICD-10-CM | POA: Diagnosis not present

## 2023-04-07 DIAGNOSIS — Z7984 Long term (current) use of oral hypoglycemic drugs: Secondary | ICD-10-CM

## 2023-04-07 DIAGNOSIS — I2510 Atherosclerotic heart disease of native coronary artery without angina pectoris: Secondary | ICD-10-CM | POA: Diagnosis present

## 2023-04-07 DIAGNOSIS — E669 Obesity, unspecified: Secondary | ICD-10-CM | POA: Diagnosis present

## 2023-04-07 DIAGNOSIS — I10 Essential (primary) hypertension: Secondary | ICD-10-CM | POA: Diagnosis present

## 2023-04-07 DIAGNOSIS — Z91199 Patient's noncompliance with other medical treatment and regimen due to unspecified reason: Secondary | ICD-10-CM

## 2023-04-07 DIAGNOSIS — Z8589 Personal history of malignant neoplasm of other organs and systems: Secondary | ICD-10-CM

## 2023-04-07 DIAGNOSIS — R7989 Other specified abnormal findings of blood chemistry: Secondary | ICD-10-CM | POA: Diagnosis present

## 2023-04-07 LAB — CBC
HCT: 39.5 % (ref 39.0–52.0)
Hemoglobin: 12.4 g/dL — ABNORMAL LOW (ref 13.0–17.0)
MCH: 30 pg (ref 26.0–34.0)
MCHC: 31.4 g/dL (ref 30.0–36.0)
MCV: 95.6 fL (ref 80.0–100.0)
Platelets: 207 10*3/uL (ref 150–400)
RBC: 4.13 MIL/uL — ABNORMAL LOW (ref 4.22–5.81)
RDW: 15 % (ref 11.5–15.5)
WBC: 2.5 10*3/uL — ABNORMAL LOW (ref 4.0–10.5)
nRBC: 0 % (ref 0.0–0.2)

## 2023-04-07 LAB — CBC WITH DIFFERENTIAL/PLATELET
Abs Immature Granulocytes: 0.01 10*3/uL (ref 0.00–0.07)
Basophils Absolute: 0 10*3/uL (ref 0.0–0.1)
Basophils Relative: 0 %
Eosinophils Absolute: 0 10*3/uL (ref 0.0–0.5)
Eosinophils Relative: 0 %
HCT: 37.8 % — ABNORMAL LOW (ref 39.0–52.0)
Hemoglobin: 12.1 g/dL — ABNORMAL LOW (ref 13.0–17.0)
Immature Granulocytes: 0 %
Lymphocytes Relative: 25 %
Lymphs Abs: 0.6 10*3/uL — ABNORMAL LOW (ref 0.7–4.0)
MCH: 30.5 pg (ref 26.0–34.0)
MCHC: 32 g/dL (ref 30.0–36.0)
MCV: 95.2 fL (ref 80.0–100.0)
Monocytes Absolute: 0.2 10*3/uL (ref 0.1–1.0)
Monocytes Relative: 7 %
Neutro Abs: 1.7 10*3/uL (ref 1.7–7.7)
Neutrophils Relative %: 68 %
Platelets: 180 10*3/uL (ref 150–400)
RBC: 3.97 MIL/uL — ABNORMAL LOW (ref 4.22–5.81)
RDW: 15 % (ref 11.5–15.5)
WBC: 2.5 10*3/uL — ABNORMAL LOW (ref 4.0–10.5)
nRBC: 0 % (ref 0.0–0.2)

## 2023-04-07 LAB — URINALYSIS, ROUTINE W REFLEX MICROSCOPIC
Bacteria, UA: NONE SEEN
Bilirubin Urine: NEGATIVE
Glucose, UA: >=500 mg/dL — AB
Ketones, ur: NEGATIVE mg/dL
Leukocytes,Ua: NEGATIVE
Nitrite: NEGATIVE
Protein, ur: 100 mg/dL — AB
Specific Gravity, Urine: 1.024 (ref 1.005–1.030)
pH: 5 (ref 5.0–8.0)

## 2023-04-07 LAB — I-STAT CHEM 8, ED
BUN: 46 mg/dL — ABNORMAL HIGH (ref 8–23)
Calcium, Ion: 1.05 mmol/L — ABNORMAL LOW (ref 1.15–1.40)
Chloride: 102 mmol/L (ref 98–111)
Creatinine, Ser: 1.8 mg/dL — ABNORMAL HIGH (ref 0.61–1.24)
Glucose, Bld: 532 mg/dL (ref 70–99)
HCT: 40 % (ref 39.0–52.0)
Hemoglobin: 13.6 g/dL (ref 13.0–17.0)
Potassium: 5.4 mmol/L — ABNORMAL HIGH (ref 3.5–5.1)
Sodium: 138 mmol/L (ref 135–145)
TCO2: 28 mmol/L (ref 22–32)

## 2023-04-07 LAB — BASIC METABOLIC PANEL
Anion gap: 11 (ref 5–15)
BUN: 32 mg/dL — ABNORMAL HIGH (ref 8–23)
CO2: 21 mmol/L — ABNORMAL LOW (ref 22–32)
Calcium: 7.8 mg/dL — ABNORMAL LOW (ref 8.9–10.3)
Chloride: 104 mmol/L (ref 98–111)
Creatinine, Ser: 1.91 mg/dL — ABNORMAL HIGH (ref 0.61–1.24)
GFR, Estimated: 35 mL/min — ABNORMAL LOW (ref >60)
Glucose, Bld: 501 mg/dL (ref 70–99)
Potassium: 4.3 mmol/L (ref 3.5–5.1)
Sodium: 136 mmol/L (ref 135–145)

## 2023-04-07 LAB — CBG MONITORING, ED
Glucose-Capillary: 594 mg/dL (ref 70–99)
Glucose-Capillary: 496 mg/dL — ABNORMAL HIGH (ref 70–99)
Glucose-Capillary: 427 mg/dL — ABNORMAL HIGH (ref 70–99)
Glucose-Capillary: 351 mg/dL — ABNORMAL HIGH (ref 70–99)

## 2023-04-07 LAB — LACTIC ACID, PLASMA
Lactic Acid, Venous: 2.8 mmol/L (ref 0.5–1.9)
Lactic Acid, Venous: 2.5 mmol/L (ref 0.5–1.9)

## 2023-04-07 LAB — BLOOD GAS, VENOUS
Acid-Base Excess: 0.6 mmol/L (ref 0.0–2.0)
Bicarbonate: 26.6 mmol/L (ref 20.0–28.0)
O2 Saturation: 23.6 %
Patient temperature: 37
pCO2, Ven: 47 mmHg (ref 44–60)
pH, Ven: 7.36 (ref 7.25–7.43)
pO2, Ven: <31 mmHg — CL (ref 32–45)

## 2023-04-07 LAB — TROPONIN I (HIGH SENSITIVITY)
Troponin I (High Sensitivity): 79 ng/L — ABNORMAL HIGH (ref <18)
Troponin I (High Sensitivity): 72 ng/L — ABNORMAL HIGH (ref <18)

## 2023-04-07 MED ORDER — INSULIN REGULAR(HUMAN) IN NACL 100-0.9 UT/100ML-% IV SOLN
INTRAVENOUS | Status: DC
  Administered 2023-04-07: 13 [IU]/h via INTRAVENOUS
  Filled 2023-04-07: qty 100

## 2023-04-07 MED ORDER — LACTATED RINGERS IV BOLUS
1000.0000 mL | Freq: Once | INTRAVENOUS | Status: AC
  Administered 2023-04-07: 1000 mL via INTRAVENOUS

## 2023-04-07 MED ORDER — IPRATROPIUM-ALBUTEROL 0.5-2.5 (3) MG/3ML IN SOLN
3.0000 mL | Freq: Once | RESPIRATORY_TRACT | Status: AC
  Administered 2023-04-07: 3 mL via RESPIRATORY_TRACT
  Filled 2023-04-07: qty 3

## 2023-04-07 MED ORDER — SODIUM CHLORIDE 0.9 % IV BOLUS
500.0000 mL | Freq: Once | INTRAVENOUS | Status: AC
  Administered 2023-04-07: 500 mL via INTRAVENOUS

## 2023-04-07 MED ORDER — POTASSIUM CHLORIDE 10 MEQ/100ML IV SOLN
10.0000 meq | INTRAVENOUS | Status: AC
  Administered 2023-04-07 (×2): 10 meq via INTRAVENOUS
  Filled 2023-04-07 (×2): qty 100

## 2023-04-07 MED ORDER — INSULIN ASPART 100 UNIT/ML IJ SOLN
8.0000 [IU] | Freq: Once | INTRAMUSCULAR | Status: AC
  Administered 2023-04-07: 8 [IU] via SUBCUTANEOUS
  Filled 2023-04-07: qty 0.08

## 2023-04-07 MED ORDER — LACTATED RINGERS IV SOLN: INTRAVENOUS | Status: DC

## 2023-04-07 MED ORDER — INSULIN GLARGINE-YFGN 100 UNIT/ML ~~LOC~~ SOLN
30.0000 [IU] | Freq: Once | SUBCUTANEOUS | Status: AC
  Administered 2023-04-07: 30 [IU] via SUBCUTANEOUS
  Filled 2023-04-07: qty 0.3

## 2023-04-07 MED ORDER — DEXTROSE IN LACTATED RINGERS 5 % IV SOLN: INTRAVENOUS | Status: DC

## 2023-04-07 MED ORDER — DEXTROSE 50 % IV SOLN: 0.0000 mL | INTRAVENOUS | Status: DC | PRN

## 2023-04-07 NOTE — ED Triage Notes (Signed)
Patient BIB [facility] from home. Patient has not taken his insulin in 4 days. Meter read HIGH for EMS. Patient took 30 units of insulin before coming, he thinks. He does not know what kind of insulin. No nausea or vomiting but feels weak. Keeps urinating on self.

## 2023-04-07 NOTE — ED Notes (Addendum)
RN attempted IV insertion x2. IV team consulted for IV and bloodwork.

## 2023-04-07 NOTE — ED Provider Notes (Signed)
[place] [facility]PARTMENT AT [facility] Provider [facility]   CSN: [id number] Arrival date & time: 04/07/23  1439     History  Chief Complaint  Patient presents with   Hyperglycemia    Paul [name] is a 77 y.o. male.  ourse: Patient is a 77 year old male with the lung CA, COPD, CHF, diabetes mellitus type 2, HTN presented from home with elevated blood sugar.  He states he has not had his insulin for 2 days that he told EMS 4 days.  He takes 35 units of Lantus at night as well as metformin and Jardiance during the day.  States he has his insulin he just has not taken it.  EMS reports meter was reading high.  Did take 30 units of insulin prior to coming here but does not know what type.  Feels weak all over.  No nausea or vomiting.  No chest pain.  Shortness of breath is at baseline.  Has increased frequency and urgency for the past several days.  Currently being treated for lung cancer.  States his breathing is at baseline.  No abdominal pain, vomiting or diarrhea.  No known fever.  Recent admission March 18-21 for CAP and COPD exacerbation. Has been on steroids.    The history is provided by the patient.  Hyperglycemia Associated symptoms: fatigue and weakness   Associated symptoms: no abdominal pain, no chest pain, no dysuria, no fever, no nausea, no shortness of breath and no vomiting        Home Medications Prior to Admission medications   Medication Sig Start Date End Date Taking? Authorizing Provider  albuterol (VENTOLIN HFA) 108 (90 Base) MCG/ACT inhaler Inhale 2 puffs into the lungs every 6 (six) hours as needed for wheezing or shortness of breath. 03/12/23   Heilingoetter, Cassandra L, PA-C  aspirin 81 MG chewable tablet Take 81 mg by mouth daily.    [provider]  atorvastatin (LIPITOR) 40 MG tablet Take 40 mg by mouth daily.    [provider]  benzonatate (TESSALON) 200 MG capsule Take 1 capsule (200 mg total) by mouth 3 (three) times  daily as needed for cough. 03/19/23   Rai, Delene Ruffiniipudeep K, MD  blood glucose meter kit and supplies KIT Dispense based on patient and insurance preference. Use up to four times daily as directed. 06/24/21   Meredeth IdeLama, Gagan S, MD  carvedilol (COREG) 25 MG tablet Take 25 mg by mouth at bedtime. 04/14/18   [provider]  clobetasol cream (TEMOVATE) 0.05 % Apply 1 Application topically 2 (two) times daily as needed (back pain).    [provider]  empagliflozin (JARDIANCE) 10 MG TABS tablet Take 1 tablet by mouth daily. 03/03/23   [provider]  fluticasone-salmeterol (ADVAIR HFA) 230-21 MCG/ACT inhaler Inhale 2 puffs into the lungs 2 (two) times daily. 03/19/23   Rai, Delene Ruffiniipudeep K, MD  HYDROcodone bit-homatropine (HYCODAN) 5-1.5 MG/5ML syrup Take 5 mLs by mouth every 6 (six) hours as needed for cough. 03/19/23   Rai, Delene Ruffiniipudeep K, MD  Insulin Glargine-aglr 100 UNIT/ML SOPN Inject 35 Units into the skin at bedtime. 03/19/23   Rai, Delene Ruffiniipudeep K, MD  levothyroxine (SYNTHROID) 125 MCG tablet Take 1 tablet (125 mcg total) by mouth daily before breakfast. 03/13/23   Heilingoetter, Cassandra L, PA-C  lidocaine-prilocaine (EMLA) cream Apply 1 application topically as needed. Patient taking differently: Apply 1 application  topically daily as needed (back pain). 12/10/21   Heilingoetter, Cassandra L, PA-C  Menthol-Methyl Salicylate (MUSCLE  RUB) 10-15 % CREA Apply 1 application topically daily as needed for muscle pain.    [provider]  metFORMIN (GLUCOPHAGE) 500 MG tablet Take 1 tablet (500 mg total) by mouth 2 (two) times daily with a meal. 03/19/23 07/17/23  Rai, Ripudeep K, MD  Multiple Vitamins-Minerals (MULTIVITAMIN WITH MINERALS) tablet Take 1 tablet by mouth daily.    [provider]  nitroGLYCERIN (NITROSTAT) 0.4 MG SL tablet Place 0.4 mg under the tongue every 5 (five) minutes as needed for chest pain. 09/06/18   [provider]  prochlorperazine (COMPAZINE) 10 MG tablet  Take 1 tablet (10 mg total) by mouth every 6 (six) hours as needed for nausea or vomiting. 06/17/21   Si Gaul, MD  silver sulfADIAZINE (SILVADENE) 1 % cream Apply 1 Application topically daily as needed (rash).    [provider]  triamcinolone cream (KENALOG) 0.1 % Apply 1 Application topically 2 (two) times daily as needed (rash).    [provider]      Allergies    Patient has no known allergies.    Review of Systems   Review of Systems  Constitutional:  Positive for activity change, appetite change and fatigue. Negative for fever.  HENT:  Negative for congestion and rhinorrhea.   Respiratory:  Negative for cough, chest tightness and shortness of breath.   Cardiovascular:  Negative for chest pain.  Gastrointestinal:  Negative for abdominal pain, nausea and vomiting.  Genitourinary:  Negative for dysuria and hematuria.  Neurological:  Positive for weakness and light-headedness. Negative for headaches.    all other systems are negative except as noted in the HPI and PMH.   Physical Exam Updated Vital Signs BP (!) 131/94   Pulse (!) 105   Temp 98 F (36.7 C) (Oral)   Resp 19   Ht 5\' 8"  (1.727 m)   Wt 110 kg   SpO2 94%   BMI 36.87 kg/m  Physical Exam Vitals and nursing note reviewed.  Constitutional:      General: He is not in acute distress.    Appearance: He is well-developed.     Comments: Chronically ill-appearing, dry mucous membrane  HENT:     Head: Normocephalic and atraumatic.     Mouth/Throat:     Pharynx: No oropharyngeal exudate.  Eyes:     Conjunctiva/sclera: Conjunctivae normal.     Pupils: Pupils are equal, round, and reactive to light.  Neck:     Comments: No meningismus. Cardiovascular:     Rate and Rhythm: Normal rate and regular rhythm.     Heart sounds: Normal heart sounds. No murmur heard. Pulmonary:     Effort: Pulmonary effort is normal. No respiratory distress.     Breath sounds: Wheezing present.  Abdominal:      Palpations: Abdomen is soft.     Tenderness: There is no abdominal tenderness. There is no guarding or rebound.  Musculoskeletal:        General: No tenderness. Normal range of motion.     Cervical back: Normal range of motion and neck supple.  Skin:    General: Skin is warm.  Neurological:     Mental Status: He is alert and oriented to person, place, and time.     Cranial Nerves: No cranial nerve deficit.     Motor: No abnormal muscle tone.     Coordination: Coordination normal.     Comments: No ataxia on finger to nose bilaterally. No pronator drift. 5/5 strength throughout. CN 2-12 intact.Equal  grip strength. Sensation intact.   Psychiatric:        Behavior: Behavior normal.     ED Results / Procedures / Treatments   Labs (all labs ordered are listed, but only abnormal results are displayed) Labs Reviewed  CBC - Abnormal; Notable for the following components:      Result Value   WBC 2.5 (*)    RBC 4.13 (*)    Hemoglobin 12.4 (*)    All other components within normal limits  URINALYSIS, ROUTINE W REFLEX MICROSCOPIC - Abnormal; Notable for the following components:   Glucose, UA >=500 (*)    Hgb urine dipstick MODERATE (*)    Protein, ur 100 (*)    All other components within normal limits  BLOOD GAS, VENOUS - Abnormal; Notable for the following components:   pO2, Ven <31 (*)    All other components within normal limits  LACTIC ACID, PLASMA - Abnormal; Notable for the following components:   Lactic Acid, Venous 2.8 (*)    All other components within normal limits  CBG MONITORING, ED - Abnormal; Notable for the following components:   Glucose-Capillary 594 (*)    All other components within normal limits  I-STAT CHEM 8, ED - Abnormal; Notable for the following components:   Potassium 5.4 (*)    BUN 46 (*)    Creatinine, Ser 1.80 (*)    Glucose, Bld 532 (*)    Calcium, Ion 1.05 (*)    All other components within normal limits  TROPONIN I (HIGH SENSITIVITY) - Abnormal;  Notable for the following components:   Troponin I (High Sensitivity) 79 (*)    All other components within normal limits  LACTIC ACID, PLASMA  BASIC METABOLIC PANEL  CBG MONITORING, ED  CBG MONITORING, ED  TROPONIN I (HIGH SENSITIVITY)    EKG EKG Interpretation  Date/Time:  Tuesday April 07 2023 14:51:53 EDT Ventricular Rate:  101 PR Interval:  144 QRS Duration: 134 QT Interval:  395 QTC Calculation: 512 R Axis:   75 Text Interpretation: Sinus tachycardia LVH with secondary repolarization abnormality Anterior ST elevation, probably due to LVH Prolonged QT interval Artifact in lead(s) I aVR aVL Rate faster Confirmed by Glynn Octave [phone]) on 04/07/2023 4:07:27 PM  Radiology DG Chest Portable 1 View  Result Date: 04/07/2023 CLINICAL DATA:  Altered mental status. EXAM: PORTABLE CHEST 1 VIEW COMPARISON:  March 16, 2023. FINDINGS: Stable cardiomegaly. Status post coronary bypass graft. Lungs are clear. Bony thorax is unremarkable. IMPRESSION: No active disease. Electronically Signed   By: Lupita Raider M.D.   On: 04/07/2023 15:51    Procedures Procedures    Medications Ordered in ED Medications  ipratropium-albuterol (DUONEB) 0.5-2.5 (3) MG/3ML nebulizer solution 3 mL (has no administration in time range)  sodium chloride 0.9 % bolus 500 mL (has no administration in time range)    ED Course/ Medical Decision Making/ A&P Clinical Course as of 04/07/23 1818  Tue Apr 07, 2023  1714 Stable 64 YOM with a chief complaint of hyperglycemia.  Missed insulin x4 days he thinks Took some insulin before coming.  Denies new symptoms otherwise.  [CC]    Clinical Course User Index [CC] Glyn Ade, MD                             Medical Decision Making Amount and/or Complexity of Data Reviewed Independent Historian: EMS Labs: ordered. Decision-making details documented in ED Course. Radiology: ordered and  independent interpretation performed. Decision-making details  documented in ED Course. ECG/medicine tests: ordered and independent interpretation performed. Decision-making details documented in ED Course.  Risk Prescription drug management.   Elevated blood sugar in the setting of noncompliance with insulin as well as steroid use.  Vital stable, no distress.  CBG 594 on arrival.  Will hydrate gently given his history of heart failure with reduced ejection fraction. EF 30-35%.  Wheezing and tachypneic on exam. Duoneb given.   Labs to be obtained, r/o DKA. Gentle IV hydration.  CXR without edema or infiltrate. Results reviewed and interpreted by me.   Unclear what insulin he took prior to coming in. Sounds like lantus.  Admits to noncompliance "for a few days".  Labs pending at shift change. R/o DKA. Nebulizers for wheezing.   Dr. Doran Durand to assume care and disposition.           Final Clinical Impression(s) / ED Diagnoses Final diagnoses:  None    Rx / DC Orders ED Discharge Orders     None         Ashanti Ratti, Jeannett Senior, MD 04/07/23 1821

## 2023-04-07 NOTE — ED Provider Notes (Signed)
Care of patient received from prior provider at 11:54 PM, please see their note for complete H/P and care plan.  Received handoff per ED course.  Clinical Course as of 04/07/23 2354  Tue Apr 07, 2023  1714 Stable 68 YOM with a chief complaint of hyperglycemia.  Missed insulin x4 days he thinks Took some insulin before coming.  Denies new symptoms otherwise.  [CC]  2153 DG Chest Portable 1 View [CC]    Clinical Course User Index [CC] Glyn Ade, MD   CRITICAL CARE Performed by: Glyn Ade  Total critical care time: 35 minutes  Critical care time was exclusive of separately billable procedures and treating other patients.  Critical care was necessary to treat or prevent imminent or life-threatening deterioration.  Critical care was time spent personally by me on the following activities: development of treatment plan with patient and/or surrogate as well as nursing, discussions with consultants, evaluation of patient's response to treatment, examination of patient, obtaining history from patient or surrogate, ordering and performing treatments and interventions, ordering and review of laboratory studies, ordering and review of radiographic studies, pulse oximetry and re-evaluation of patient's condition.  Reassessment: Reassessed at bedside.  Patient's glucose is not improving despite insulin administration. Glucose remained elevated despite additional 30 units Lantus and 8 units fast acting.  Labs raise concern for possible developing HHS given lack of glucose improvement with home insulin.  IV fluids administered, IV insulin administered and hospitalist was consulted for further care and management.  Likely to require advanced care and management inpatient to progressive care unit at this time.  Reassessed frequently given further IV fluids because of clinical evidence of severe dehydration.  Disposition:   Based on the above findings, I believe this patient is stable for  admission.    Patient/family educated about specific findings on our evaluation and explained exact reasons for admission.  Patient/family educated about clinical situation and time was allowed to answer questions.   Admission team communicated with and agreed with need for admission. Patient admitted. Patient ready to move at this time.     Emergency Department Medication Summary:   Medications  insulin regular, human (MYXREDLIN) 100 units/ 100 mL infusion (13 Units/hr Intravenous Rate/Dose Change 04/07/23 2302)  lactated ringers infusion ( Intravenous New Bag/Given 04/07/23 2209)  dextrose 5 % in lactated ringers infusion (0 mLs Intravenous Hold 04/07/23 2257)  dextrose 50 % solution 0-50 mL (has no administration in time range)  potassium chloride 10 mEq in 100 mL IVPB (10 mEq Intravenous New Bag/Given 04/07/23 2301)  ipratropium-albuterol (DUONEB) 0.5-2.5 (3) MG/3ML nebulizer solution 3 mL (3 mLs Nebulization Given 04/07/23 1534)  sodium chloride 0.9 % bolus 500 mL (0 mLs Intravenous Stopped 04/07/23 1814)  ipratropium-albuterol (DUONEB) 0.5-2.5 (3) MG/3ML nebulizer solution 3 mL (3 mLs Nebulization Given 04/07/23 1814)  insulin aspart (novoLOG) injection 8 Units (8 Units Subcutaneous Given 04/07/23 1815)  insulin glargine-yfgn (SEMGLEE) injection 30 Units (30 Units Subcutaneous Given 04/07/23 2119)  lactated ringers bolus 1,000 mL (1,000 mLs Intravenous New Bag/Given 04/07/23 2209)           Glyn Ade, MD 04/07/23 2355

## 2023-04-07 NOTE — Telephone Encounter (Signed)
This nurse reached out to patient and his son related to missed appointment today.  Mobile number went to voicemail and it is full.  The home number kept ringing.  Left a message for the son to give [place] a call in the office at [phone].  No further concerns noted at this time.

## 2023-04-07 NOTE — H&P (Signed)
History and Physical    Patient: Paul [name] VVZ:[id number] DOB: 16-Mar-1946 DOA: 04/07/2023 DOS: the patient was seen and examined on 04/07/2023 PCP: [name]  Patient coming from: Home  Chief Complaint:  Chief Complaint  Patient presents with   Hyperglycemia   HPI: Paul [name] is a 77 y.o. male with medical history significant of lung cancer, hypothyroidism, essential hypertension, type 2 diabetes insulin-dependent, systolic dysfunction CHF with a EF of 30%, coronary artery disease status post CABG, COPD and recent pneumonia who presents to the ER with altered mental status.  His roommate alerted EMS.  Patient brought in here still slightly confused but able to communicate with me.  He has apparently not been taking his insulin as prescribed.  He did report taking his insulin just before coming to the ER blood sugar how much.  Patient received IV fluids and subcu insulin in the ER but not doing better.  Amount of fluid to be given is limited by the fact that patient is having low EF of 30%.  He is being admitted with diagnosis of HHS.  Review of Systems: As mentioned in the history of present illness. All other systems reviewed and are negative. Past Medical History:  Diagnosis Date   CHF (congestive heart failure)    Coronary artery disease    Diabetes mellitus without complication    History of radiation therapy    Right lung- 11/27/21-01/10/22- Dr. Antony Blackbird   Hypertension    Hypothyroidism    Ischemic cardiomyopathy    Pneumonia    a long time ago   Sleep apnea    no longer uses a cpap   Past Surgical History:  Procedure Laterality Date   BRONCHIAL BIOPSY  06/04/2021   Procedure: BRONCHIAL BIOPSIES;  Surgeon: Josephine Igo, DO;  Location: [facility];  Service: Pulmonary;;   BRONCHIAL BRUSHINGS  06/04/2021   Procedure: BRONCHIAL BRUSHINGS;  Surgeon: Josephine Igo, DO;  Location: [facility];  Service: Pulmonary;;   BRONCHIAL NEEDLE ASPIRATION  BIOPSY  06/04/2021   Procedure: BRONCHIAL NEEDLE ASPIRATION BIOPSIES;  Surgeon: Josephine Igo, DO;  Location: [facility];  Service: Pulmonary;;   BRONCHIAL WASHINGS  06/04/2021   Procedure: BRONCHIAL WASHINGS;  Surgeon: Josephine Igo, DO;  Location: [facility];  Service: Pulmonary;;   CARDIAC SURGERY     COLONOSCOPY     CORONARY ARTERY BYPASS GRAFT     2019 at [facility]   VIDEO BRONCHOSCOPY WITH ENDOBRONCHIAL NAVIGATION Right 06/04/2021   Procedure: VIDEO BRONCHOSCOPY WITH ENDOBRONCHIAL NAVIGATION;  Surgeon: Josephine Igo, DO;  Location: [facility];  Service: Pulmonary;  Laterality: Right;   VIDEO BRONCHOSCOPY WITH ENDOBRONCHIAL ULTRASOUND  06/04/2021   Procedure: VIDEO BRONCHOSCOPY WITH ENDOBRONCHIAL ULTRASOUND;  Surgeon: Josephine Igo, DO;  Location: [facility];  Service: Pulmonary;;   Social History:  reports that he has been smoking cigarettes. He started smoking about 59 years ago. He has been smoking an average of .5 packs per day. He has never used smokeless tobacco. He reports that he does not drink alcohol and does not use drugs.  No Known Allergies  Family History  Family history unknown: Yes    Prior to Admission medications   Medication Sig Start Date End Date Taking? Authorizing Provider  albuterol (VENTOLIN HFA) 108 (90 Base) MCG/ACT inhaler Inhale 2 puffs into the lungs every 6 (six) hours as needed for wheezing or shortness of breath. 03/12/23   Heilingoetter, Cassandra L, PA-C  aspirin 81 MG chewable tablet Take  81 mg by mouth daily.    [provider]  atorvastatin (LIPITOR) 40 MG tablet Take 40 mg by mouth daily.    [provider]  benzonatate (TESSALON) 200 MG capsule Take 1 capsule (200 mg total) by mouth 3 (three) times daily as needed for cough. 03/19/23   Rai, Delene Ruffini, MD  blood glucose meter kit and supplies KIT Dispense based on patient and insurance preference. Use up to four times daily as directed. 06/24/21   Meredeth Ide, MD   carvedilol (COREG) 25 MG tablet Take 25 mg by mouth at bedtime. 04/14/18   [provider]  clobetasol cream (TEMOVATE) 0.05 % Apply 1 Application topically 2 (two) times daily as needed (back pain).    [provider]  empagliflozin (JARDIANCE) 10 MG TABS tablet Take 1 tablet by mouth daily. 03/03/23   [provider]  fluticasone-salmeterol (ADVAIR HFA) 230-21 MCG/ACT inhaler Inhale 2 puffs into the lungs 2 (two) times daily. 03/19/23   Rai, Delene Ruffini, MD  HYDROcodone bit-homatropine (HYCODAN) 5-1.5 MG/5ML syrup Take 5 mLs by mouth every 6 (six) hours as needed for cough. 03/19/23   Rai, Delene Ruffini, MD  Insulin Glargine-aglr 100 UNIT/ML SOPN Inject 35 Units into the skin at bedtime. 03/19/23   Rai, Delene Ruffini, MD  levothyroxine (SYNTHROID) 125 MCG tablet Take 1 tablet (125 mcg total) by mouth daily before breakfast. 03/13/23   Heilingoetter, Cassandra L, PA-C  lidocaine-prilocaine (EMLA) cream Apply 1 application topically as needed. Patient taking differently: Apply 1 application  topically daily as needed (back pain). 12/10/21   Heilingoetter, Cassandra L, PA-C  Menthol-Methyl Salicylate (MUSCLE RUB) 10-15 % CREA Apply 1 application topically daily as needed for muscle pain.    [provider]  metFORMIN (GLUCOPHAGE) 500 MG tablet Take 1 tablet (500 mg total) by mouth 2 (two) times daily with a meal. 03/19/23 07/17/23  Rai, Ripudeep K, MD  Multiple Vitamins-Minerals (MULTIVITAMIN WITH MINERALS) tablet Take 1 tablet by mouth daily.    [provider]  nitroGLYCERIN (NITROSTAT) 0.4 MG SL tablet Place 0.4 mg under the tongue every 5 (five) minutes as needed for chest pain. 09/06/18   [provider]  prochlorperazine (COMPAZINE) 10 MG tablet Take 1 tablet (10 mg total) by mouth every 6 (six) hours as needed for nausea or vomiting. 06/17/21   Si Gaul, MD  silver sulfADIAZINE (SILVADENE) 1 % cream Apply 1 Application topically daily as needed (rash).     [provider]  triamcinolone cream (KENALOG) 0.1 % Apply 1 Application topically 2 (two) times daily as needed (rash).    [provider]    Physical Exam: Vitals:   04/07/23 1830 04/07/23 1856 04/07/23 1900 04/07/23 2000  BP: (!) 134/117  133/83 128/81  Pulse: (!) 101  99 99  Resp: (!) 44  (!) 30 (!) 22  Temp:  98.2 F (36.8 C)    TempSrc:  Oral    SpO2: 96%  95% 95%  Weight:      Height:       Constitutional: Obese, acutely ill looking, mildly confused NAD, calm, comfortable Eyes: PERRL, lids and conjunctivae normal ENMT: Mucous membranes are dry. Posterior pharynx clear of any exudate or lesions.Normal dentition.  Neck: normal, supple, no masses, no thyromegaly Respiratory: clear to auscultation bilaterally, no wheezing, no crackles. Normal respiratory effort. No accessory muscle use.  Cardiovascular: Sinus tachycardia, no murmurs / rubs / gallops. No extremity edema. 2+ pedal pulses. No carotid bruits.  Abdomen:  no tenderness, no masses palpated. No hepatosplenomegaly. Bowel sounds positive.  Musculoskeletal: Good range of motion, no joint swelling or tenderness, Skin: no rashes, lesions, ulcers. No induration Neurologic: CN 2-12 grossly intact. Sensation intact, DTR normal. Strength 5/5 in all 4.  Psychiatric: Normal judgment and insight. Alert and oriented x 3. Normal mood  Data Reviewed:  pH 7.36, blood sugar 532, potassium 5.4, lactic acid 2.5, creatinine 1.91, gap of 11.  Urinalysis showed no leukocytosis moderate hemoglobin.  Chest x-ray showed no active disease.  White count is 2.5 with hemoglobin 12.4.  Assessment and Plan:   #1 hyperosmolar hyperglycemia: Patient will be admitted to stepdown unit and initiate insulin drip.  Will be careful with fluid resuscitation due to danger of fluid overload.  #2 systolic dysfunction CHF: Monitor closely.  Avoid fluid overload.  #3 coronary artery disease: Status post CABG.  Appears to be at baseline.   Continue monitoring.  #4 CKD 3A: With acute worsening.  Continue fluid resuscitation  #5 GERD: Continue PPIs.  #6 adenocarcinoma of the right lung: Continue follow-up with oncology.  #7 hyperlipidemia: Resume home regimen of statin.     Advance Care Planning:   Code Status: Full Code   Consults: None  Family Communication: No family at bedside  Severity of Illness: The appropriate patient status for this patient is INPATIENT. Inpatient status is judged to be reasonable and necessary in order to provide the required intensity of service to ensure the patient's safety. The patient's presenting symptoms, physical exam findings, and initial radiographic and laboratory data in the context of their chronic comorbidities is felt to place them at high risk for further clinical deterioration. Furthermore, it is not anticipated that the patient will be medically stable for discharge from the hospital within 2 midnights of admission.   * I certify that at the point of admission it is my clinical judgment that the patient will require inpatient hospital care spanning beyond 2 midnights from the point of admission due to high intensity of service, high risk for further deterioration and high frequency of surveillance required.*  AuthorLonia Blood: Allisha Harter,LAWAL, MD 04/07/2023 10:36 PM  For on call review [email or web address].

## 2023-04-07 NOTE — ED Notes (Signed)
Chem 8 given to MD and RN 

## 2023-04-08 ENCOUNTER — Encounter: Payer: Self-pay | Admitting: Internal Medicine

## 2023-04-08 ENCOUNTER — Telehealth: Payer: Self-pay | Admitting: Internal Medicine

## 2023-04-08 ENCOUNTER — Inpatient Hospital Stay (HOSPITAL_COMMUNITY): Payer: Medicare Other

## 2023-04-08 DIAGNOSIS — J449 Chronic obstructive pulmonary disease, unspecified: Secondary | ICD-10-CM | POA: Diagnosis not present

## 2023-04-08 DIAGNOSIS — Z794 Long term (current) use of insulin: Secondary | ICD-10-CM

## 2023-04-08 DIAGNOSIS — E1165 Type 2 diabetes mellitus with hyperglycemia: Secondary | ICD-10-CM

## 2023-04-08 DIAGNOSIS — G9341 Metabolic encephalopathy: Secondary | ICD-10-CM | POA: Diagnosis not present

## 2023-04-08 LAB — BASIC METABOLIC PANEL
Anion gap: 11 (ref 5–15)
Anion gap: 9 (ref 5–15)
Anion gap: 4 — ABNORMAL LOW (ref 5–15)
BUN: 29 mg/dL — ABNORMAL HIGH (ref 8–23)
BUN: 28 mg/dL — ABNORMAL HIGH (ref 8–23)
BUN: 24 mg/dL — ABNORMAL HIGH (ref 8–23)
CO2: 21 mmol/L — ABNORMAL LOW (ref 22–32)
CO2: 22 mmol/L (ref 22–32)
CO2: 23 mmol/L (ref 22–32)
Calcium: 8.1 mg/dL — ABNORMAL LOW (ref 8.9–10.3)
Calcium: 8.4 mg/dL — ABNORMAL LOW (ref 8.9–10.3)
Calcium: 7.9 mg/dL — ABNORMAL LOW (ref 8.9–10.3)
Chloride: 108 mmol/L (ref 98–111)
Chloride: 109 mmol/L (ref 98–111)
Chloride: 112 mmol/L — ABNORMAL HIGH (ref 98–111)
Creatinine, Ser: 1.8 mg/dL — ABNORMAL HIGH (ref 0.61–1.24)
Creatinine, Ser: 1.72 mg/dL — ABNORMAL HIGH (ref 0.61–1.24)
Creatinine, Ser: 1.46 mg/dL — ABNORMAL HIGH (ref 0.61–1.24)
GFR, Estimated: 38 mL/min — ABNORMAL LOW (ref >60)
GFR, Estimated: 40 mL/min — ABNORMAL LOW (ref >60)
GFR, Estimated: 49 mL/min — ABNORMAL LOW (ref >60)
Glucose, Bld: 256 mg/dL — ABNORMAL HIGH (ref 70–99)
Glucose, Bld: 172 mg/dL — ABNORMAL HIGH (ref 70–99)
Glucose, Bld: 172 mg/dL — ABNORMAL HIGH (ref 70–99)
Potassium: 3.4 mmol/L — ABNORMAL LOW (ref 3.5–5.1)
Potassium: 3.4 mmol/L — ABNORMAL LOW (ref 3.5–5.1)
Potassium: 3.5 mmol/L (ref 3.5–5.1)
Sodium: 140 mmol/L (ref 135–145)
Sodium: 140 mmol/L (ref 135–145)
Sodium: 139 mmol/L (ref 135–145)

## 2023-04-08 LAB — GLUCOSE, CAPILLARY
Glucose-Capillary: 100 mg/dL — ABNORMAL HIGH (ref 70–99)
Glucose-Capillary: 100 mg/dL — ABNORMAL HIGH (ref 70–99)
Glucose-Capillary: 101 mg/dL — ABNORMAL HIGH (ref 70–99)
Glucose-Capillary: 108 mg/dL — ABNORMAL HIGH (ref 70–99)
Glucose-Capillary: 115 mg/dL — ABNORMAL HIGH (ref 70–99)
Glucose-Capillary: 141 mg/dL — ABNORMAL HIGH (ref 70–99)
Glucose-Capillary: 148 mg/dL — ABNORMAL HIGH (ref 70–99)
Glucose-Capillary: 171 mg/dL — ABNORMAL HIGH (ref 70–99)
Glucose-Capillary: 44 mg/dL — CL (ref 70–99)
Glucose-Capillary: 44 mg/dL — CL (ref 70–99)
Glucose-Capillary: 511 mg/dL (ref 70–99)
Glucose-Capillary: 59 mg/dL — ABNORMAL LOW (ref 70–99)
Glucose-Capillary: 60 mg/dL — ABNORMAL LOW (ref 70–99)
Glucose-Capillary: 69 mg/dL — ABNORMAL LOW (ref 70–99)
Glucose-Capillary: 71 mg/dL (ref 70–99)
Glucose-Capillary: 74 mg/dL (ref 70–99)
Glucose-Capillary: 74 mg/dL (ref 70–99)
Glucose-Capillary: 96 mg/dL (ref 70–99)
Glucose-Capillary: 97 mg/dL (ref 70–99)

## 2023-04-08 LAB — OSMOLALITY: Osmolality: 309 mOsm/kg — ABNORMAL HIGH (ref 275–295)

## 2023-04-08 LAB — BETA-HYDROXYBUTYRIC ACID: Beta-Hydroxybutyric Acid: 0.08 mmol/L (ref 0.05–0.27)

## 2023-04-08 MED ORDER — LEVOTHYROXINE SODIUM 25 MCG PO TABS: 125.0000 ug | ORAL_TABLET | Freq: Every day | ORAL | Status: DC

## 2023-04-08 MED ORDER — NITROGLYCERIN 0.4 MG SL SUBL: 0.4000 mg | SUBLINGUAL_TABLET | SUBLINGUAL | Status: DC | PRN

## 2023-04-08 MED ORDER — INSULIN ASPART 100 UNIT/ML IJ SOLN
0.0000 [IU] | Freq: Three times a day (TID) | INTRAMUSCULAR | Status: DC
  Administered 2023-04-09: 5 [IU] via SUBCUTANEOUS
  Administered 2023-04-09 (×2): 3 [IU] via SUBCUTANEOUS
  Administered 2023-04-10: 2 [IU] via SUBCUTANEOUS
  Administered 2023-04-10 – 2023-04-11 (×2): 3 [IU] via SUBCUTANEOUS
  Administered 2023-04-11: 2 [IU] via SUBCUTANEOUS
  Administered 2023-04-11 – 2023-04-12 (×2): 3 [IU] via SUBCUTANEOUS
  Administered 2023-04-12 (×2): 2 [IU] via SUBCUTANEOUS
  Administered 2023-04-13: 8 [IU] via SUBCUTANEOUS
  Administered 2023-04-13: 3 [IU] via SUBCUTANEOUS
  Administered 2023-04-13: 5 [IU] via SUBCUTANEOUS
  Administered 2023-04-14: 3 [IU] via SUBCUTANEOUS

## 2023-04-08 MED ORDER — LEVOTHYROXINE SODIUM 25 MCG PO TABS
125.0000 ug | ORAL_TABLET | Freq: Once | ORAL | Status: AC
  Administered 2023-04-08: 125 ug via ORAL
  Filled 2023-04-08: qty 1

## 2023-04-08 MED ORDER — ORAL CARE MOUTH RINSE: 15.0000 mL | OROMUCOSAL | Status: DC | PRN

## 2023-04-08 MED ORDER — POTASSIUM CHLORIDE CRYS ER 20 MEQ PO TBCR
40.0000 meq | EXTENDED_RELEASE_TABLET | Freq: Once | ORAL | Status: AC
  Administered 2023-04-08: 40 meq via ORAL
  Filled 2023-04-08: qty 2

## 2023-04-08 MED ORDER — ATORVASTATIN CALCIUM 40 MG PO TABS
40.0000 mg | ORAL_TABLET | Freq: Every day | ORAL | Status: DC
  Administered 2023-04-09 – 2023-04-14 (×6): 40 mg via ORAL
  Filled 2023-04-08 (×6): qty 1

## 2023-04-08 MED ORDER — INSULIN REGULAR(HUMAN) IN NACL 100-0.9 UT/100ML-% IV SOLN
INTRAVENOUS | Status: DC
  Administered 2023-04-08: 1.7 [IU]/h via INTRAVENOUS

## 2023-04-08 MED ORDER — BUDESONIDE 0.25 MG/2ML IN SUSP
0.2500 mg | Freq: Two times a day (BID) | RESPIRATORY_TRACT | Status: DC
  Administered 2023-04-08 – 2023-04-14 (×13): 0.25 mg via RESPIRATORY_TRACT
  Filled 2023-04-08 (×12): qty 2

## 2023-04-08 MED ORDER — LACTATED RINGERS IV BOLUS: 20.0000 mL/kg | Freq: Once | INTRAVENOUS | Status: DC

## 2023-04-08 MED ORDER — ASPIRIN 81 MG PO CHEW: 81.0000 mg | CHEWABLE_TABLET | Freq: Every day | ORAL | Status: DC

## 2023-04-08 MED ORDER — ALBUTEROL SULFATE HFA 108 (90 BASE) MCG/ACT IN AERS: 2.0000 | INHALATION_SPRAY | Freq: Four times a day (QID) | RESPIRATORY_TRACT | Status: DC | PRN

## 2023-04-08 MED ORDER — CHLORHEXIDINE GLUCONATE CLOTH 2 % EX PADS: 6.0000 | MEDICATED_PAD | Freq: Every day | CUTANEOUS | Status: DC

## 2023-04-08 MED ORDER — ALBUTEROL SULFATE (2.5 MG/3ML) 0.083% IN NEBU
2.5000 mg | INHALATION_SOLUTION | Freq: Four times a day (QID) | RESPIRATORY_TRACT | Status: DC | PRN
  Administered 2023-04-08: 2.5 mg via RESPIRATORY_TRACT
  Filled 2023-04-08 (×2): qty 3

## 2023-04-08 MED ORDER — CARVEDILOL 12.5 MG PO TABS
25.0000 mg | ORAL_TABLET | Freq: Once | ORAL | Status: AC
  Administered 2023-04-08: 25 mg via ORAL
  Filled 2023-04-08: qty 2

## 2023-04-08 MED ORDER — DEXTROSE IN LACTATED RINGERS 5 % IV SOLN: INTRAVENOUS | Status: DC

## 2023-04-08 MED ORDER — ENOXAPARIN SODIUM 40 MG/0.4ML IJ SOSY
40.0000 mg | PREFILLED_SYRINGE | INTRAMUSCULAR | Status: DC
  Administered 2023-04-08 – 2023-04-14 (×7): 40 mg via SUBCUTANEOUS
  Filled 2023-04-08 (×7): qty 0.4

## 2023-04-08 MED ORDER — LEVOTHYROXINE SODIUM 125 MCG PO TABS
125.0000 ug | ORAL_TABLET | Freq: Every day | ORAL | Status: DC
  Administered 2023-04-09 – 2023-04-14 (×6): 125 ug via ORAL
  Filled 2023-04-08 (×6): qty 1

## 2023-04-08 MED ORDER — CHLORHEXIDINE GLUCONATE CLOTH 2 % EX PADS
6.0000 | MEDICATED_PAD | Freq: Every day | CUTANEOUS | Status: DC
  Administered 2023-04-09 (×2): 6 via TOPICAL

## 2023-04-08 MED ORDER — DEXTROSE 50 % IV SOLN
0.0000 mL | INTRAVENOUS | Status: DC | PRN
  Administered 2023-04-08: 25 mL via INTRAVENOUS
  Administered 2023-04-08: 50 mL via INTRAVENOUS
  Administered 2023-04-08 (×2): 25 mL via INTRAVENOUS
  Filled 2023-04-08 (×4): qty 50

## 2023-04-08 MED ORDER — DEXTROSE 5 % IV SOLN: INTRAVENOUS | Status: DC

## 2023-04-08 MED ORDER — POTASSIUM CHLORIDE 10 MEQ/100ML IV SOLN
10.0000 meq | INTRAVENOUS | Status: AC
  Administered 2023-04-08 (×2): 10 meq via INTRAVENOUS
  Filled 2023-04-08 (×2): qty 100

## 2023-04-08 MED ORDER — IPRATROPIUM-ALBUTEROL 0.5-2.5 (3) MG/3ML IN SOLN
3.0000 mL | Freq: Four times a day (QID) | RESPIRATORY_TRACT | Status: DC
  Administered 2023-04-08 – 2023-04-13 (×24): 3 mL via RESPIRATORY_TRACT
  Filled 2023-04-08 (×22): qty 3

## 2023-04-08 MED ORDER — CARVEDILOL 25 MG PO TABS
25.0000 mg | ORAL_TABLET | Freq: Every day | ORAL | Status: DC
  Administered 2023-04-08 – 2023-04-13 (×6): 25 mg via ORAL
  Filled 2023-04-08 (×2): qty 1
  Filled 2023-04-08: qty 2
  Filled 2023-04-08 (×3): qty 1

## 2023-04-08 MED ORDER — INSULIN ASPART 100 UNIT/ML IJ SOLN: 0.0000 [IU] | Freq: Every day | INTRAMUSCULAR | Status: DC

## 2023-04-08 MED ORDER — ASPIRIN 81 MG PO CHEW
81.0000 mg | CHEWABLE_TABLET | Freq: Every day | ORAL | Status: DC
  Administered 2023-04-08 – 2023-04-14 (×7): 81 mg via ORAL
  Filled 2023-04-08 (×7): qty 1

## 2023-04-08 MED ORDER — LACTATED RINGERS IV SOLN: INTRAVENOUS | Status: DC

## 2023-04-08 NOTE — Inpatient Diabetes Management (Signed)
Inpatient Diabetes Program Recommendations  AACE/ADA: New Consensus Statement on Inpatient Glycemic Control (2015)  Target Ranges:  Prepandial:   less than 140 mg/dL      Peak postprandial:   less than 180 mg/dL (1-2 hours)      Critically ill patients:  140 - 180 mg/dL   Lab Results  Component Value Date   GLUCAP 100 (H) 04/08/2023   HGBA1C 8.3 (H) 03/19/2023    Review of Glycemic Control  Diabetes history: DM2 Outpatient Diabetes medications: Lantus 30 QD, metformin 500 mg BID, Jardiance 10 mg QD Current orders for Inpatient glycemic control: Novolog 0-15 TID with meals  Inpatient Diabetes Program Recommendations:    Agree with orders.  Will likely need small amount of basal insulin if FBS> 180 mg/dL  Spoke with pt at bedside regarding his diabetes and meds at home. Pt was somewhat confused when asked about home meds he takes. York Spaniel he takes 30 units of Lantus every morning, said he's never taken it twice/day, only 1x/day.   Will continue to follow glucose trends.  Thank you. Ailene Ards, RD, LDN, CDCES Inpatient Diabetes Coordinator [phone]

## 2023-04-08 NOTE — Progress Notes (Signed)
[facility] PROGRESS NOTE   Paul [name] YSA:[id number] DOB: 03/17/1946 DOA: 04/07/2023  PCP: [name]  Brief History/Interval Summary: 77 y.o. 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  His roommate alerted EMS.  He was noted to have elevated glucose levels.  He mentioned that he forgot to take his insulin.    Consultants: None  Procedures: None    Subjective/Interval History: Patient denies any shortness of breath or chest pain.  No nausea or vomiting.  Complains of fatigue.  Assessment/Plan:  Hyperosmolar hyperglycemia This was in the setting of noncompliance.  HbA1c was 8.3 on March 21. Patient was given IV fluids and started on insulin infusion.  Transition to subcutaneous insulin.  Had hypoglycemic episodes overnight.  Currently on D5 infusion. Compliance emphasized to the patient.  Continue to monitor him off of long-acting insulin for now due to his persistent hypoglycemia.  Acute metabolic encephalopathy Secondary to hyperglycemia.  Seems to be improving.  Does not have any focal neurological deficits.  Appears to be physically deconditioned.  Will involve PT and OT.  Chronic systolic CHF/coronary artery disease Seems to be well compensated currently.  Avoid fluid overload.  Noted to be on aspirin statin and carvedilol. Home medications reviewed.  Not noted to be on ACE inhibitor or ARB.  History of COPD Recently admitted for pneumonia and COPD exacerbation.  However he continues to smoke cigarettes.  He was counseled.  Does not use oxygen at home.  Noted to be wheezing.  Add nebulizer treatments.  Continue with Advair.  Acute kidney injury on chronic kidney disease stage IIIa Baseline creatinine  seems to be around 1.5.  Came in with elevated creatinine.  Seems to be improving.  Monitor urine output.  Avoid nephrotoxic agents.  Leukopenia/normocytic anemia Monitor counts closely.  GERD Continue PPI  Lung cancer Follow-up with oncology.  Obesity Estimated body mass index is 36.87 kg/m as calculated from the following:   Height as of this encounter: 5\' 8"  (1.727 m).   Weight as of this encounter: 110 kg.  DVT Prophylaxis: Lovenox Code Status: Full code Family Communication: Discussed with patient Disposition Plan: To be determined  Status is: Inpatient Remains inpatient appropriate because: Acute metabolic encephalopathy, hyperglycemia      Medications: Scheduled:  aspirin  81 mg Oral Daily   atorvastatin  40 mg Oral Daily   carvedilol  25 mg Oral QHS   Chlorhexidine Gluconate Cloth  6 each Topical Daily   enoxaparin (LOVENOX) injection  40 mg Subcutaneous Q24H   insulin aspart  0-15 Units Subcutaneous TID WC   levothyroxine  125 mcg Oral QAC breakfast   potassium chloride  40 mEq Oral Once   Continuous:  dextrose     NAT:FTDDUKGUR, dextrose, nitroGLYCERIN  Antibiotics: Anti-infectives (From admission, onward)    None       Objective:  Vital Signs  Vitals:   04/08/23 0500 04/08/23 0600 04/08/23 0700 04/08/23 0745  BP: 108/62 (!) 101/58 116/74   Pulse: 83 83 80   Resp: (!) 24 19 (!) 21   Temp:    99.9 F (37.7 C)  TempSrc:    Oral  SpO2: 96% 98% 95%   Weight:      Height:        Intake/Output Summary (Last 24 hours)  at 04/08/2023 0841 Last data filed at 04/08/2023 0600 Gross per 24 hour  Intake 1797.41 ml  Output --  Net 1797.41 ml   Filed Weights   04/07/23 1448  Weight: 110 kg    General appearance: Awake alert.  In no distress Resp: Coarse breath sounds bilaterally with scattered wheezing.  No definite crackles or rhonchi.  Normal effort at rest. Cardio: S1-S2 is normal regular.  No S3-S4.  No rubs murmurs or bruit GI: Abdomen  is soft.  Nontender nondistended.  Bowel sounds are present normal.  No masses organomegaly Extremities: No edema.  Physical deconditioning noted. Neurologic: Alert and oriented x3.  No focal neurological deficits.    Lab Results:  Data Reviewed: I have personally reviewed following labs and reports of the imaging studies  CBC: Recent Labs  Lab 04/07/23 1455 04/07/23 1716 04/07/23 2326  WBC 2.5*  --  2.5*  NEUTROABS  --   --  1.7  HGB 12.4* 13.6 12.1*  HCT 39.5 40.0 37.8*  MCV 95.6  --  95.2  PLT 207  --  180    Basic Metabolic Panel: Recent Labs  Lab 04/07/23 1716 04/07/23 1800 04/07/23 2326 04/08/23 0130 04/08/23 0517  NA 138 136 140 140 139  K 5.4* 4.3 3.4* 3.4* 3.5  CL 102 104 108 109 112*  CO2  --  21* 21* 22 23  GLUCOSE 532* 501* 256* 172* 172*  BUN 46* 32* 29* 28* 24*  CREATININE 1.80* 1.91* 1.80* 1.72* 1.46*  CALCIUM  --  7.8* 8.1* 8.4* 7.9*    GFR: Estimated Creatinine Clearance: 50.1 mL/min (A) (by C-G formula based on SCr of 1.46 mg/dL (H)).   CBG: Recent Labs  Lab 04/08/23 0519 04/08/23 0603 04/08/23 0658 04/08/23 0737 04/08/23 0814  GLUCAP 171* 100* 74 69* 115*     Radiology Studies: DG Chest Portable 1 View  Result Date: 04/07/2023 CLINICAL DATA:  Altered mental status. EXAM: PORTABLE CHEST 1 VIEW COMPARISON:  March 16, 2023. FINDINGS: Stable cardiomegaly. Status post coronary bypass graft. Lungs are clear. Bony thorax is unremarkable. IMPRESSION: No active disease. Electronically Signed   By: Lupita Raider M.D.   On: 04/07/2023 15:51       LOS: 1 day   Cathy Ropp  [facility] Pager on [email or web address]  04/08/2023, 8:41 AM

## 2023-04-08 NOTE — Progress Notes (Signed)
Transferred care to [facility]. Gave patient prn breathing tx for

## 2023-04-08 NOTE — Progress Notes (Signed)
       Overnight   NAME: Paul [name] MRN: [id number] DOB : Jun 28, 1946    Date of Service   04/08/2023   HPI/Events of Note   Notified by RN for labs possibly qualifying for transition off of Insulin drip. Transition started , however CBG s started to decline ( patient had reportedly taken a dose of Long acting before ER intake)  Long acting insulin not ordered for transition as patient required Dextrose IVF to maintain CBGs , this is still infusing and requiring Dextrose (D50) Occasionally. At approximately the 14 hour-15 hour mark CBGs appear to be improving    Interventions/ Plan   Maintain IVF w Dextrose D50 as needed  CBGs per replacement protocol and AC/HS otherwise       Chinita Greenland BSN MSNA MSN ACNPC-AG Acute Care Nurse Practitioner [facility]

## 2023-04-08 NOTE — Evaluation (Signed)
Physical Therapy Evaluation Patient Details Name: Paul [name] MRN: [id number] DOB: 01/01/1946 Today's Date: 04/08/2023  History of Present Illness  Pt is 77 yo male admitted 04/07/23 with hyperosmolar hyperglycemia and metabolic encephalopathy in context of missed insulin dose. Pt with hx including but not limited to  lung cancer, hypothyroidism, essential hypertension, type 2 diabetes insulin-dependent, systolic dysfunction CHF with a EF of 30%, coronary artery disease status post CABG, COPD and recent pneumonia  Clinical Impression  Pt admitted with above diagnosis. At baseline, pt is independent with adls, iadls, community ambulation, and performs a flight of stairs to enter his apartment.  He lives with a roommate.  Today, pt requiring mod A for bed mobility and min A stand and step pivot to chair.  He did have some shortness of breath that limited by was also had phone calls in regard to personal matters that needed immediate attention, so was positioned in recliner with needs in reach. Pt only admitted 1 day and PLOF was independent so expected to progress well; however, he does require mod A right now and with AMPAC score of 13, so patient will benefit from continued inpatient follow up therapy, <3 hours/day at d/c from PT perspective unless able to progress mobility prior to d/c. Pt currently with functional limitations due to the deficits listed below (see PT Problem List). Pt will benefit from acute skilled PT to increase their independence and safety with mobility to allow discharge.          Recommendations for follow up therapy are one component of a multi-disciplinary discharge planning process, led by the attending physician.  Recommendations may be updated based on patient status, additional functional criteria and insurance authorization.   Can patient physically be transported by private vehicle: Yes     Assistance Recommended at Discharge Frequent or constant  Supervision/Assistance  Patient can return home with the following  A little help with walking and/or transfers;A little help with bathing/dressing/bathroom;Assistance with cooking/housework;Help with stairs or ramp for entrance    Equipment Recommendations Rolling walker (2 wheels)  Recommendations for Other Services       Functional Status Assessment Patient has had a recent decline in their functional status and demonstrates the ability to make significant improvements in function in a reasonable and predictable amount of time.     Precautions / Restrictions Precautions Precautions: Fall      Mobility  Bed Mobility Overal bed mobility: Needs Assistance Bed Mobility: Supine to Sit     Supine to sit: Mod assist     General bed mobility comments: increased time with multimodal cues for sequencing    Transfers Overall transfer level: Needs assistance Equipment used: 1 person hand held assist Transfers: Sit to/from Stand, Bed to chair/wheelchair/BSC Sit to Stand: Min assist   Step pivot transfers: Min assist       General transfer comment: Cues for initiation with min A to steady    Ambulation/Gait               General Gait Details: deferred due to shortness of breath and pt needing to handle personal matters on phone  Stairs            Wheelchair Mobility    Modified Rankin (Stroke Patients Only)       Balance Overall balance assessment: Needs assistance Sitting-balance support: No upper extremity supported Sitting balance-Leahy Scale: Fair     Standing balance support: Single extremity supported, Reliant on assistive device for balance Standing balance-Leahy Scale:  Poor                               Pertinent Vitals/Pain Pain Assessment Pain Assessment: No/denies pain    Home Living Family/patient expects to be discharged to:: Private residence Living Arrangements: Non-relatives/Friends (roomate) Available Help at  Discharge: Friend(s);Available PRN/intermittently Type of Home: Apartment Home Access: Stairs to enter Entrance Stairs-Rails: Left Entrance Stairs-Number of Steps: 13   Home Layout: One level Home Equipment: Cane - single point      Prior Function Prior Level of Function : Independent/Modified Independent;Driving             Mobility Comments: Reports could ambulate in community and perform stairs; stated occasionally used cane; had 1 recent fall due to legs giving away ADLs Comments: independent with adls and iadls per report     Hand Dominance        Extremity/Trunk Assessment   Upper Extremity Assessment Upper Extremity Assessment: Overall WFL for tasks assessed    Lower Extremity Assessment Lower Extremity Assessment: LLE deficits/detail;RLE deficits/detail RLE Deficits / Details: ROM WFL; MMT 4+/5 LLE Deficits / Details: ROM WFL; MMT 4+/5    Cervical / Trunk Assessment Cervical / Trunk Assessment: Kyphotic  Communication      Cognition Arousal/Alertness: Awake/alert Behavior During Therapy: Flat affect Overall Cognitive Status: No family/caregiver present to determine baseline cognitive functioning                                 General Comments: Pt oriented x 3 and follows commands. Does require increased time to respond and cues for initiation of task.  Additionally, pt distracted by personal matters/phone calls        General Comments General comments (skin integrity, edema, etc.): Pt on 3 L O2 with sats 90%.  RR in 30's with activity but pt reports feels near normal.    Exercises     Assessment/Plan    PT Assessment Patient needs continued PT services  PT Problem List Decreased strength;Decreased range of motion;Decreased activity tolerance;Decreased balance;Decreased mobility;Decreased knowledge of precautions;Decreased cognition;Decreased knowledge of use of DME;Decreased safety awareness       PT Treatment Interventions DME  instruction;Therapeutic exercise;Gait training;Stair training;Functional mobility training;Therapeutic activities;Patient/family education;Modalities;Balance training    PT Goals (Current goals can be found in the Care Plan section)  Acute Rehab PT Goals Patient Stated Goal: Return home PT Goal Formulation: With patient Time For Goal Achievement: 04/22/23 Potential to Achieve Goals: Good    Frequency Min 1X/week     Co-evaluation               AM-PAC PT "6 Clicks" Mobility  Outcome Measure Help needed turning from your back to your side while in a flat bed without using bedrails?: A Little Help needed moving from lying on your back to sitting on the side of a flat bed without using bedrails?: A Lot Help needed moving to and from a bed to a chair (including a wheelchair)?: A Little Help needed standing up from a chair using your arms (e.g., wheelchair or bedside chair)?: A Little Help needed to walk in hospital room?: Total Help needed climbing 3-5 steps with a railing? : Total 6 Click Score: 13    End of Session Equipment Utilized During Treatment: Gait belt Activity Tolerance: Patient tolerated treatment well Patient left: with chair alarm set;in chair;with call bell/phone within  reach;with nursing/sitter in room Nurse Communication: Mobility status PT Visit Diagnosis: Other abnormalities of gait and mobility (R26.89);Muscle weakness (generalized) (M62.81)    Time: [id number] PT Time Calculation (min) (ACUTE ONLY): 23 min   Charges:   PT Evaluation $PT Eval Low Complexity: 1 Low PT Treatments $Therapeutic Activity: 8-22 mins        Anise Salvoacia, PT [facility] [phone]   Rayetta HumphreyDacia H Lyla Jasek 04/08/2023, 3:12 PM

## 2023-04-08 NOTE — TOC Initial Note (Addendum)
Transition of Care [facility]) - Initial/Assessment Note    Patient Details  Name: Paul [name] MRN: [id number] Date of Birth: 09/02/1946  [facility]) CM/SW Contact:    Lavenia Atlas, RN Phone Number: 04/08/2023, 5:22 PM  Clinical Narrative:                 Per chart review patient recently discharged from [facility] hospital on 03/19/23. Patient presented to [facility] this visit due to stop taking insulin for 4 days. This RNCM spoke with patient at bedside to discuss PT recommendations. Patient request this RNCM to contact his son Viviann Spare. This RNCM spoke with son who reports he is on his way to the hospital and will bring patient's medications however he is unclear where the medications are located at patient's home. At baseline patient was independent, PT recommend short term SNF and DME: RW.    [facility] who [facility] Dr. Carleene Cooper ext [phone], [facility] [place] [phone] ext [phone]. This RNCM left contact information for [facility] [place], awaiting a call back.  TOC will continue to follow for needs  Expected Discharge Plan: Skilled Nursing Facility Barriers to Discharge: Continued Medical Work up   Patient Goals and CMS Choice Patient states their goals for this hospitalization and ongoing recovery are:: unknown at this time CMS Medicare.gov Compare Post Acute Care list provided to:: Patient Choice offered to / list presented to : Patient [place] ownership interest in [facility].provided to:: Patient    Expected Discharge Plan and Services In-house Referral: NA Discharge Planning Services: CM Consult   Living arrangements for the past 2 months: Apartment                 DME Arranged: Walker rolling                    Prior Living Arrangements/Services Living arrangements for the past 2 months: Apartment Lives with:: Roommate Patient language and need for interpreter reviewed:: Yes Do you feel safe going  back to the place where you live?: Yes      Need for Family Participation in Patient Care: No (Comment) Care giver support system in place?: Yes (comment) Current home services: DME (cane) Criminal Activity/Legal Involvement Pertinent to Current Situation/Hospitalization: No - Comment as needed  Activities of Daily Living Home Assistive Devices/Equipment: None ADL Screening (condition at time of admission) Patient's cognitive ability adequate to safely complete daily activities?: No Is the patient deaf or have difficulty hearing?: No Does the patient have difficulty seeing, even when wearing glasses/contacts?: No Does the patient have difficulty concentrating, remembering, or making decisions?: Yes Patient able to express need for assistance with ADLs?: Yes Does the patient have difficulty dressing or bathing?: Yes Independently performs ADLs?: Yes (appropriate for developmental age) Does the patient have difficulty walking or climbing stairs?: Yes Weakness of Legs: Both Weakness of Arms/Hands: Both  Permission Sought/Granted Permission sought to share information with : Case Manager Permission granted to share information with : Yes, Verbal Permission Granted  Share Information with NAME: Case manager           Emotional Assessment Appearance:: Appears stated age Attitude/Demeanor/Rapport: Gracious Affect (typically observed): Accepting Orientation: : Oriented to Self, Oriented to Place, Oriented to  Time Alcohol / Substance Use: Not Applicable Psych Involvement: No (comment)  Admission diagnosis:  Hyperglycemia [R73.9] Hyperosmolar hyperglycemic state (HHS) [E11.00] Patient Active Problem List   Diagnosis Date Noted   Type  2 diabetes mellitus with chronic kidney disease, with long-term current use of insulin 03/18/2023   Community acquired bacterial pneumonia 03/16/2023   Shortness of breath 03/12/2023   AKI (acute kidney injury) 07/10/2022   Hyperkalemia 07/10/2022    Hypokalemia 07/10/2022   DKA (diabetic ketoacidosis) 07/09/2022   Cough 06/04/2022   Goals of care, counseling/discussion 10/23/2021   Hyperosmolar hyperglycemic state (HHS) 06/23/2021   Hyperglycemia due to diabetes mellitus 06/22/2021   GERD (gastroesophageal reflux disease) 06/22/2021   Essential hypertension 06/22/2021   Hyperlipidemia 06/22/2021   Adenocarcinoma of right lung, stage 3 06/17/2021   Encounter for antineoplastic chemotherapy 06/17/2021   Encounter for antineoplastic immunotherapy 06/17/2021   Lung nodule 05/17/2021   Stage 3a chronic kidney disease (CKD) 09/03/2018   CAD, multiple vessel 09/03/2018   PCP:  [name] Pharmacy:   [facility] - [place], [place] - [address] [place] [address] Phone: [phone] Fax: [phone]  [facility] [address] [place] - [address] [place] [address] Phone: [phone] Fax: [phone]     Social Determinants of Health (SDOH) Social History: SDOH Screenings   Food Insecurity: No Food Insecurity (04/08/2023)  Housing: Low Risk  (04/08/2023)  Transportation Needs: No Transportation Needs (04/08/2023)  Utilities: Not At Risk (04/08/2023)  Financial Resource Strain: Low Risk  (11/22/2021)  Social Connections: Moderately Isolated (11/22/2021)  Tobacco Use: High Risk (04/07/2023)   SDOH Interventions:     Readmission Risk Interventions    04/08/2023    4:58 PM  Readmission Risk Prevention Plan  Transportation Screening Complete  PCP or Specialist Appt within 5-7 Days Complete  Home Care Screening Complete  Medication Review (RN CM) Complete

## 2023-04-08 NOTE — Progress Notes (Signed)
Hypoglycemic Event  CBG: 59 at 15:44  Treatment: D50 25 mL (12.5 gm) at 15:53  Symptoms: Shaky  Follow-up CBG: Time: 16:08   CBG Result: 101  Possible Reasons for Event: Unknown  Comments/MD notified: Dr. Rito Ehrlich

## 2023-04-08 NOTE — Telephone Encounter (Signed)
Contacted patient to scheduled appointments. Left message with appointment details and a call back number if patient had any questions or could not accommodate the time we provided.   

## 2023-04-09 DIAGNOSIS — I5022 Chronic systolic (congestive) heart failure: Secondary | ICD-10-CM | POA: Diagnosis not present

## 2023-04-09 DIAGNOSIS — J449 Chronic obstructive pulmonary disease, unspecified: Secondary | ICD-10-CM | POA: Diagnosis not present

## 2023-04-09 DIAGNOSIS — Z794 Long term (current) use of insulin: Secondary | ICD-10-CM | POA: Diagnosis not present

## 2023-04-09 DIAGNOSIS — E1165 Type 2 diabetes mellitus with hyperglycemia: Secondary | ICD-10-CM | POA: Diagnosis not present

## 2023-04-09 LAB — BASIC METABOLIC PANEL
Anion gap: 7 (ref 5–15)
BUN: 24 mg/dL — ABNORMAL HIGH (ref 8–23)
CO2: 22 mmol/L (ref 22–32)
Calcium: 8 mg/dL — ABNORMAL LOW (ref 8.9–10.3)
Chloride: 109 mmol/L (ref 98–111)
Creatinine, Ser: 1.55 mg/dL — ABNORMAL HIGH (ref 0.61–1.24)
GFR, Estimated: 46 mL/min — ABNORMAL LOW (ref >60)
Glucose, Bld: 188 mg/dL — ABNORMAL HIGH (ref 70–99)
Potassium: 3.6 mmol/L (ref 3.5–5.1)
Sodium: 138 mmol/L (ref 135–145)

## 2023-04-09 LAB — GLUCOSE, CAPILLARY
Glucose-Capillary: 137 mg/dL — ABNORMAL HIGH (ref 70–99)
Glucose-Capillary: 163 mg/dL — ABNORMAL HIGH (ref 70–99)
Glucose-Capillary: 175 mg/dL — ABNORMAL HIGH (ref 70–99)
Glucose-Capillary: 176 mg/dL — ABNORMAL HIGH (ref 70–99)
Glucose-Capillary: 184 mg/dL — ABNORMAL HIGH (ref 70–99)
Glucose-Capillary: 192 mg/dL — ABNORMAL HIGH (ref 70–99)
Glucose-Capillary: 224 mg/dL — ABNORMAL HIGH (ref 70–99)

## 2023-04-09 LAB — CBC
HCT: 32.6 % — ABNORMAL LOW (ref 39.0–52.0)
Hemoglobin: 10.5 g/dL — ABNORMAL LOW (ref 13.0–17.0)
MCH: 31.1 pg (ref 26.0–34.0)
MCHC: 32.2 g/dL (ref 30.0–36.0)
MCV: 96.4 fL (ref 80.0–100.0)
Platelets: 209 10*3/uL (ref 150–400)
RBC: 3.38 MIL/uL — ABNORMAL LOW (ref 4.22–5.81)
RDW: 15.3 % (ref 11.5–15.5)
WBC: 2.7 10*3/uL — ABNORMAL LOW (ref 4.0–10.5)
nRBC: 0 % (ref 0.0–0.2)

## 2023-04-09 MED ORDER — FLUTICASONE PROPIONATE 50 MCG/ACT NA SUSP
2.0000 | Freq: Every day | NASAL | Status: DC
  Administered 2023-04-09 – 2023-04-14 (×6): 2 via NASAL
  Filled 2023-04-09: qty 16

## 2023-04-09 NOTE — Evaluation (Signed)
Occupational Therapy Evaluation Patient Details Name: Paul [name] MRN: [id number] DOB: 1946-02-20 Today's Date: 04/09/2023   History of Present Illness Pt is 77 yo male admitted 04/07/23 with hyperosmolar hyperglycemia and metabolic encephalopathy in context of missed insulin dose. Pt with hx including but not limited to  lung cancer, hypothyroidism, essential hypertension, type 2 diabetes insulin-dependent, systolic dysfunction CHF with a EF of 30%, coronary artery disease status post CABG, COPD and recent pneumonia   Clinical Impression   Patient admitted for the diagnosis above.  PTA he lives in a second floor apartment with a roommate who can provide supportive assist only.  Typically the patient uses a SPC for mobility, and reports independence with sponge baths.  Currently he is needing up to Mod A for transfers and lower body ADL.  Currently patient is needing more assist than what can be provided at home, so the patient will benefit from continued inpatient follow up therapy, <3 hours/day.  OT is indicated in the acute setting to address deficits listed.         Recommendations for follow up therapy are one component of a multi-disciplinary discharge planning process, led by the attending physician.  Recommendations may be updated based on patient status, additional functional criteria and insurance authorization.   Assistance Recommended at Discharge Frequent or constant Supervision/Assistance  Patient can return home with the following Help with stairs or ramp for entrance;A lot of help with walking and/or transfers;A lot of help with bathing/dressing/bathroom;Direct supervision/assist for medications management;Assistance with cooking/housework;Assist for transportation    Functional Status Assessment  Patient has had a recent decline in their functional status and demonstrates the ability to make significant improvements in function in a reasonable and predictable amount of time.   Equipment Recommendations  BSC/3in1    Recommendations for Other Services       Precautions / Restrictions Precautions Precautions: Fall Precaution Comments: watch O2 sats on RA Restrictions Weight Bearing Restrictions: No      Mobility Bed Mobility Overal bed mobility: Needs Assistance Bed Mobility: Supine to Sit     Supine to sit: Min assist     General bed mobility comments: increased time with multimodal cues for sequencing Patient Response: Cooperative  Transfers Overall transfer level: Needs assistance Equipment used: 1 person hand held assist Transfers: Sit to/from Stand, Bed to chair/wheelchair/BSC Sit to Stand: Min assist     Step pivot transfers: Min assist            Balance Overall balance assessment: Needs assistance Sitting-balance support: No upper extremity supported Sitting balance-Leahy Scale: Fair     Standing balance support: Single extremity supported Standing balance-Leahy Scale: Poor                             ADL either performed or assessed with clinical judgement   ADL Overall ADL's : Needs assistance/impaired Eating/Feeding: Set up;Sitting   Grooming: Wash/dry hands;Wash/dry face;Supervision/safety;Sitting   Upper Body Bathing: Minimal assistance;Sitting   Lower Body Bathing: Moderate assistance;Sit to/from stand   Upper Body Dressing : Minimal assistance;Sitting   Lower Body Dressing: Moderate assistance;Maximal assistance;Sit to/from stand   Toilet Transfer: Minimal assistance;Stand-pivot;BSC/3in1                   Vision   Vision Assessment?: No apparent visual deficits     Perception     Praxis      Pertinent Vitals/Pain Pain Assessment Pain Assessment: No/denies pain  Hand Dominance Right   Extremity/Trunk Assessment Upper Extremity Assessment Upper Extremity Assessment: Generalized weakness   Lower Extremity Assessment Lower Extremity Assessment: Defer to PT evaluation    Cervical / Trunk Assessment Cervical / Trunk Assessment: Kyphotic   Communication Communication Communication: No difficulties   Cognition Arousal/Alertness: Awake/alert Behavior During Therapy: Flat affect Overall Cognitive Status: No family/caregiver present to determine baseline cognitive functioning Area of Impairment: Attention, Following commands, Safety/judgement, Problem solving                   Current Attention Level: Selective   Following Commands: Follows one step commands with increased time Safety/Judgement: Decreased awareness of deficits   Problem Solving: Slow processing, Requires verbal cues       General Comments   Desaturated on RA to 87%    Exercises     Shoulder Instructions      Home Living Family/patient expects to be discharged to:: Private residence Living Arrangements: Non-relatives/Friends Available Help at Discharge: Friend(s);Available PRN/intermittently Type of Home: Apartment Home Access: Stairs to enter Entrance Stairs-Number of Steps: 13 Entrance Stairs-Rails: Left Home Layout: One level     Bathroom Shower/Tub: Tub/shower unit;Sponge bathes at baseline   [employer]: Standard Bathroom Accessibility: Yes How Accessible: Accessible via walker Home Equipment: Cane - single point          Prior Functioning/Environment Prior Level of Function : Independent/Modified Independent;Driving             Mobility Comments: Reports could ambulate in community and perform stairs; stated occasionally used cane; had 1 recent fall due to legs giving away ADLs Comments: independent with adls and iadls per report        OT Problem List: Decreased strength;Decreased activity tolerance;Impaired balance (sitting and/or standing);Decreased safety awareness;Decreased cognition      OT Treatment/Interventions: Self-care/ADL training;Therapeutic exercise;Therapeutic activities;Cognitive remediation/compensation;Patient/family  education;Balance training;DME and/or AE instruction;Energy conservation    OT Goals(Current goals can be found in the care plan section) Acute Rehab OT Goals Patient Stated Goal: Return home OT Goal Formulation: With patient Time For Goal Achievement: 04/23/23 Potential to Achieve Goals: Good ADL Goals Pt Will Perform Grooming: with supervision;standing Pt Will Perform Lower Body Dressing: with min assist;sit to/from stand Pt Will Transfer to Toilet: ambulating;regular height toilet;with min guard assist Pt/caregiver will Perform Home Exercise Program: Increased strength;Both right and left upper extremity;With theraband;With Supervision  OT Frequency: Min 2X/week    Co-evaluation              AM-PAC OT "6 Clicks" Daily Activity     Outcome Measure Help from another person eating meals?: None Help from another person taking care of personal grooming?: None Help from another person toileting, which includes using toliet, bedpan, or urinal?: A Lot Help from another person bathing (including washing, rinsing, drying)?: A Lot Help from another person to put on and taking off regular upper body clothing?: A Lot Help from another person to put on and taking off regular lower body clothing?: A Lot 6 Click Score: 16   End of Session Equipment Utilized During Treatment: Gait belt;Oxygen Nurse Communication: Mobility status  Activity Tolerance: Patient tolerated treatment well Patient left: in chair;with call bell/phone within reach;with chair alarm set  OT Visit Diagnosis: Unsteadiness on feet (R26.81);Muscle weakness (generalized) (M62.81);Other symptoms and signs involving cognitive function                Time: 1047-1105 OT Time Calculation (min): 18 min Charges:  OT General Charges $OT Visit:  1 Visit OT Evaluation $OT Eval Moderate Complexity: 1 Mod  04/09/2023  RP, OTR/L  Acute Rehabilitation Services  Office:  [phone]   Paul [name] 04/09/2023, 11:36 AM

## 2023-04-09 NOTE — Progress Notes (Signed)
[facility] PROGRESS NOTE   Apolinar JunesJohnnie Morissette ZOX:[id number] DOB: [birth year] DOA: 04/07/2023  [facility]  Brief History/Interval Summary: 77 y.o. 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  His roommate alerted EMS.  He was noted to have elevated glucose levels.  He mentioned that he forgot to take his insulin.    Consultants: None  Procedures: None    Subjective/Interval History: Patient mentioned that he slept well.  Denies any chest pain or shortness of breath this morning.  No nausea or vomiting.  Assessment/Plan:  Hyperosmolar hyperglycemia This was in the setting of noncompliance.  HbA1c was 8.3 on March 21. Patient was given IV fluids and started on insulin infusion.  Transitioned to subcutaneous insulin.   Subsequently developed hypoglycemic episodes.  Insulin had to be discontinued.  Placed on D5 infusion.  Seems to be stabilizing.    Acute metabolic encephalopathy Secondary to hyperglycemia.  Seems to be improving.  Does not have any focal neurological deficits.  Appears to be physically deconditioned.   PT and OT has been consulted.  Skilled nursing facility is recommended for short-term rehab.  Chronic systolic CHF/coronary artery disease Last echocardiogram is from 2022 which showed left ventricular EF of 30 to 35% with grade 1 diastolic dysfunction. Noted to be on carvedilol.  Not on ACE inhibitor or ARB. Seems to be reasonably well compensated.  Continue to monitor.   Check daily weight.  History of COPD Recently admitted for pneumonia and COPD exacerbation.  However he continues to smoke cigarettes.  He was counseled.  Does not use oxygen at home.   Less wheezing today compared to yesterday.   Continue nebulizer treatments and inhaled steroids.  Respiratory status is stable for the most part though he does appear to be tachypneic at times.  Mentions that he has a dry cough.  Chest x-ray yesterday showed small bilateral pleural effusions with atelectasis.  Continue with incentive spirometry.  No clear indication for antibacterials at this time.  Acute kidney injury on chronic kidney disease stage IIIa Baseline creatinine seems to be around 1.5.  Came in with elevated creatinine.   Renal function is improving.    Leukopenia/normocytic anemia Monitor counts closely.  GERD Continue PPI  Lung cancer Follow-up with oncology.  Obesity Estimated body mass index is 36.87 kg/m as calculated from the following:   Height as of this encounter: 5\' 8"  (1.727 m).   Weight as of this encounter: 110 kg.  DVT Prophylaxis: Lovenox Code Status: Full code Family Communication: Discussed with patient Disposition Plan: SNF when medically stable  Status is: Inpatient Remains inpatient appropriate because: Acute metabolic encephalopathy, hyperglycemia      Medications: Scheduled:  aspirin  81 mg Oral Daily   atorvastatin  40 mg Oral Daily   budesonide (PULMICORT) nebulizer solution  0.25 mg Nebulization BID   carvedilol  25 mg Oral QHS   Chlorhexidine Gluconate Cloth  6 each Topical Q2200   enoxaparin (LOVENOX) injection  40 mg Subcutaneous Q24H   insulin aspart  0-15 Units Subcutaneous TID WC   ipratropium-albuterol  3 mL Nebulization QID   levothyroxine  125 mcg Oral QAC breakfast   Continuous:  dextrose 25 mL/hr at 04/09/23 0519   WJX:[id number]:albuterol, dextrose, nitroGLYCERIN, mouth rinse  Antibiotics: Anti-infectives (From admission, onward)  None       Objective:  Vital Signs  Vitals:   04/09/23 0520 04/09/23 0600 04/09/23 0700 04/09/23 0743  BP:  132/67 122/63   Pulse: 81 76 76   Resp: (!) 34 (!) 38 (!) 36   Temp:      TempSrc:      SpO2: 93% 94% 97% 96%   Weight:      Height:        Intake/Output Summary (Last 24 hours) at 04/09/2023 0837 Last data filed at 04/09/2023 0519 Gross per 24 hour  Intake 1457.01 ml  Output 550 ml  Net 907.01 ml    Filed Weights   04/07/23 1448  Weight: 110 kg    General appearance: Awake alert.  In no distress Resp: Remains tachypneic.  Improved air entry with less wheezing today compared to yesterday. Cardio: S1-S2 is normal regular.  No S3-S4.  No rubs murmurs or bruit GI: Abdomen is soft.  Nontender nondistended.  Bowel sounds are present normal.  No masses organomegaly Extremities: No edema.  Moving all of his extremities but physical deconditioning is noted. Neurologic:   No focal neurological deficits.     Lab Results:  Data Reviewed: I have personally reviewed following labs and reports of the imaging studies  CBC: Recent Labs  Lab 04/07/23 1455 04/07/23 1716 04/07/23 2326 04/09/23 0309  WBC 2.5*  --  2.5* 2.7*  NEUTROABS  --   --  1.7  --   HGB 12.4* 13.6 12.1* 10.5*  HCT 39.5 40.0 37.8* 32.6*  MCV 95.6  --  95.2 96.4  PLT 207  --  180 209     Basic Metabolic Panel: Recent Labs  Lab 04/07/23 1800 04/07/23 2326 04/08/23 0130 04/08/23 0517 04/09/23 0309  NA 136 140 140 139 138  K 4.3 3.4* 3.4* 3.5 3.6  CL 104 108 109 112* 109  CO2 21* 21* 22 23 22   GLUCOSE 501* 256* 172* 172* 188*  BUN 32* 29* 28* 24* 24*  CREATININE 1.91* 1.80* 1.72* 1.46* 1.55*  CALCIUM 7.8* 8.1* 8.4* 7.9* 8.0*     GFR: Estimated Creatinine Clearance: 47.2 mL/min (A) (by C-G formula based on SCr of 1.55 mg/dL (H)).   CBG: Recent Labs  Lab 04/08/23 1609 04/08/23 2153 04/09/23 0038 04/09/23 0501 04/09/23 0734  GLUCAP 101* 192* 184* 175* 163*      Radiology Studies: DG CHEST PORT 1 VIEW  Result Date: 04/08/2023 CLINICAL DATA:  Dyspnea EXAM: PORTABLE CHEST 1 VIEW COMPARISON:  April 9, 24. FINDINGS: Similar cardiomegaly. Suspect small bilateral pleural effusions. Mild overlying  opacities. No visible pneumothorax. CABG and median sternotomy. IMPRESSION: 1. Suspect small bilateral pleural effusions and mild overlying opacities, most likely atelectasis. 2. Cardiomegaly. Electronically Signed   By: Feliberto Harts M.D.   On: 04/08/2023 11:49   DG Chest Portable 1 View  Result Date: 04/07/2023 CLINICAL DATA:  Altered mental status. EXAM: PORTABLE CHEST 1 VIEW COMPARISON:  March 16, 2023. FINDINGS: Stable cardiomegaly. Status post coronary bypass graft. Lungs are clear. Bony thorax is unremarkable. IMPRESSION: No active disease. Electronically Signed   By: Lupita Raider M.D.   On: 04/07/2023 15:51       LOS: 2 days   Jimeka Balan Rito Ehrlich  [facility] Pager on [email or web address]  04/09/2023, 8:37 AM

## 2023-04-09 NOTE — Progress Notes (Signed)
Physical Therapy Treatment Patient Details Name: Paul [name] MRN: [id number] DOB: 1946-06-06 Today's Date: 04/09/2023   History of Present Illness Pt is 77 yo male admitted 04/07/23 with hyperosmolar hyperglycemia and metabolic encephalopathy in context of missed insulin dose. 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 Sitting vitals: 134 77, 80 HR, 96 O2 on 2 L, 33 RR. Pt required +2 assist for safety, recliner follow, and equipment when ambulating in hallway for 70 ft (35 x2 with sitting rest break). Sitting rest break vitals: 145/99, 49 RR. Lowest O2 sats was recorded at 82%, instructed on pursed lip breathing. Pt c/o stuffy nose, personal allergy medicine was given, nurse present and notified. Assisted pt to ambulate back to room and transfer back to recliner, VC needed for hand placement. Family was in room with pt.    Recommendations for follow up therapy are one component of a multi-disciplinary discharge planning process, led by the attending physician.  Recommendations may be updated based on patient status, additional functional criteria and insurance authorization.  Follow Up Recommendations  Can patient physically be transported by private vehicle: Yes    Assistance Recommended at Discharge Frequent or constant Supervision/Assistance  Patient can return home with the following A little help with walking and/or transfers;A little help with bathing/dressing/bathroom;Assistance with cooking/housework;Help with stairs or ramp for entrance   Equipment Recommendations  Rolling walker (2 wheels)    Recommendations for Other Services       Precautions / Restrictions Precautions Precautions: Fall Precaution Comments:  watch O2 sats on RA Restrictions Weight Bearing Restrictions: No     Mobility  Bed Mobility                    Transfers Overall transfer level: Needs assistance Equipment used: Rolling walker (2 wheels) Transfers: Sit to/from Stand Sit to Stand: +2 physical assistance, +2 safety/equipment, Mod assist   Step pivot transfers: Mod assist, +2 physical assistance, +2 safety/equipment       General transfer comment: Cues for initiation and hand placement    Ambulation/Gait Ambulation/Gait assistance: Mod assist, +2 physical assistance, +2 safety/equipment Gait Distance (Feet): 70 Feet (35 x2 with sitting rest break) Assistive device: Rolling walker (2 wheels) Gait Pattern/deviations: Step-to pattern, Decreased step length - right, Decreased step length - left, Antalgic, Trunk flexed Gait velocity: decreased         Stairs             Wheelchair Mobility    Modified Rankin (Stroke Patients Only)       Balance                                            Cognition Arousal/Alertness: Awake/alert Behavior During Therapy: Flat affect Overall Cognitive Status: Within Functional Limits for tasks assessed Area of Impairment: Attention, Following commands, Safety/judgement, Problem solving                   Current Attention Level: Sustained   Following Commands: Follows one step commands with increased time Safety/Judgement: Decreased awareness of deficits   Problem Solving: Slow processing, Requires verbal cues General Comments: Pt  oriented x 3 and follows commands. Does require increased time to respond and cues for initiation of task.        Exercises      General Comments        Pertinent Vitals/Pain Pain Assessment Pain Assessment: Faces Faces Pain Scale: Hurts little more Pain Location: groin Pain Descriptors / Indicators: Discomfort, Guarding Pain Intervention(s): Monitored during session, Repositioned     Home Living Family/patient expects to be discharged to:: Private residence Living Arrangements: Non-relatives/Friends Available Help at Discharge: Friend(s);Available PRN/intermittently Type of Home: Apartment Home Access: Stairs to enter Entrance Stairs-Rails: Left Entrance Stairs-Number of Steps: 13   Home Layout: One level Home Equipment: Cane - single point      Prior Function            PT Goals (current goals can now be found in the care plan section) Acute Rehab PT Goals Patient Stated Goal: Return home PT Goal Formulation: With patient Time For Goal Achievement: 04/22/23 Potential to Achieve Goals: Good Progress towards PT goals: Progressing toward goals    Frequency    Min 1X/week      PT Plan Current plan remains appropriate    Co-evaluation              AM-PAC PT "6 Clicks" Mobility   Outcome Measure  Help needed turning from your back to your side while in a flat bed without using bedrails?: A Little Help needed moving from lying on your back to sitting on the side of a flat bed without using bedrails?: A Lot Help needed moving to and from a bed to a chair (including a wheelchair)?: A Little Help needed standing up from a chair using your arms (e.g., wheelchair or bedside chair)?: A Little Help needed to walk in hospital room?: Total Help needed climbing 3-5 steps with a railing? : Total 6 Click Score: 13    End of Session Equipment Utilized During Treatment: Gait belt Activity Tolerance: Patient tolerated treatment well Patient left: with chair alarm set;in chair;with call bell/phone within reach;with family/visitor present Nurse Communication: Mobility status PT Visit Diagnosis: Other abnormalities of gait and mobility (R26.89);Muscle weakness (generalized) (M62.81)     Time: 1245-8099 PT Time Calculation (min) (ACUTE ONLY): 30 min  Charges:  $Gait Training: 8-22 mins $Therapeutic Activity: 8-22 mins                     Sharlene Motts, [place]

## 2023-04-10 ENCOUNTER — Other Ambulatory Visit: Payer: Self-pay

## 2023-04-10 DIAGNOSIS — Z794 Long term (current) use of insulin: Secondary | ICD-10-CM | POA: Diagnosis not present

## 2023-04-10 DIAGNOSIS — E1165 Type 2 diabetes mellitus with hyperglycemia: Secondary | ICD-10-CM | POA: Diagnosis not present

## 2023-04-10 DIAGNOSIS — I5022 Chronic systolic (congestive) heart failure: Secondary | ICD-10-CM | POA: Diagnosis not present

## 2023-04-10 DIAGNOSIS — J449 Chronic obstructive pulmonary disease, unspecified: Secondary | ICD-10-CM | POA: Diagnosis not present

## 2023-04-10 LAB — GLUCOSE, CAPILLARY
Glucose-Capillary: 116 mg/dL — ABNORMAL HIGH (ref 70–99)
Glucose-Capillary: 140 mg/dL — ABNORMAL HIGH (ref 70–99)
Glucose-Capillary: 170 mg/dL — ABNORMAL HIGH (ref 70–99)
Glucose-Capillary: 173 mg/dL — ABNORMAL HIGH (ref 70–99)

## 2023-04-10 LAB — BASIC METABOLIC PANEL
Anion gap: 7 (ref 5–15)
BUN: 18 mg/dL (ref 8–23)
CO2: 22 mmol/L (ref 22–32)
Calcium: 8 mg/dL — ABNORMAL LOW (ref 8.9–10.3)
Chloride: 109 mmol/L (ref 98–111)
Creatinine, Ser: 1.38 mg/dL — ABNORMAL HIGH (ref 0.61–1.24)
GFR, Estimated: 52 mL/min — ABNORMAL LOW (ref >60)
Glucose, Bld: 101 mg/dL — ABNORMAL HIGH (ref 70–99)
Potassium: 3.7 mmol/L (ref 3.5–5.1)
Sodium: 138 mmol/L (ref 135–145)

## 2023-04-10 NOTE — Progress Notes (Signed)
[facility] PROGRESS NOTE   Paul [name] EML:[id number] DOB: [birth year] DOA: 04/07/2023  PCP: [facility], [name]  Brief History/Interval Summary: 77 y.o. 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  His roommate alerted EMS.  He was noted to have elevated glucose levels.  He mentioned that he forgot to take his insulin.    Consultants: None  Procedures: None    Subjective/Interval History: Patient mentioned that he is hungry this morning.  Denies any problems overnight.  No shortness of breath currently.  Occasional cough.  Assessment/Plan:  Hyperosmolar hyperglycemia in the setting of known history of insulin-dependent diabetes mellitus Hyperglycemia was in the setting of noncompliance.  HbA1c was 8.3 on March 21. Patient was given IV fluids and started on insulin infusion.  Transitioned to subcutaneous insulin.   Subsequently developed hypoglycemic episodes.  Insulin had to be discontinued.  Placed on D5 infusion.  Glucose level stabilized.  D5 was discontinued.  Continue to monitor CBGs.  Will need to decide on the dose of long-acting insulin that he can be given.  Will determine tomorrow depending on CBG trends over the next 24 hours.  Acute metabolic encephalopathy Secondary to hyperglycemia.  Did not have any focal neurological deficits.  Appears to be physically deconditioned.   PT and OT has been consulted.  Skilled nursing facility is recommended for short-term rehab. Mentation seems to be back to baseline.  Chronic systolic CHF/coronary artery disease Last echocardiogram is from 2022 which showed left ventricular EF of 30 to 35% with grade 1 diastolic dysfunction. Prior to admission looks like patient was on  carvedilol and Entresto.  Entresto currently on hold.  Patient was also on Jardiance which is also currently on hold.  It does not appear that patient was getting any scheduled diuretics. Seems to be fairly well compensated.  Continue to monitor for now.  History of COPD Recently admitted for pneumonia and COPD exacerbation.  However he continues to smoke cigarettes.  He was counseled.  Does not use oxygen at home.  Respiratory status is improving.  Still has some wheezing but better than before. Continue with incentive spirometry.  No clear indication to reinitiate antibacterials.  Acute kidney injury on chronic kidney disease stage IIIa Baseline creatinine seems to be around 1.5.  Came in with elevated creatinine.   Renal function is now back to baseline.  Leukopenia/normocytic anemia Monitor counts closely.  Will recheck labs tomorrow.  GERD Continue PPI  Lung cancer Follow-up with oncology.  Obesity Estimated body mass index is 34.49 kg/m as calculated from the following:   Height as of this encounter: 5\' 8"  (1.727 m).   Weight as of this encounter: 102.9 kg.  DVT Prophylaxis: Lovenox Code Status: Full code Family Communication: Discussed with patient Disposition Plan: SNF when medically stable.  Anticipate discharge in 24 to 48 hours.  Status is: Inpatient Remains inpatient appropriate because: Acute metabolic encephalopathy, hyperglycemia      Medications: Scheduled:  aspirin  81 mg Oral Daily   atorvastatin  40 mg Oral Daily   budesonide (PULMICORT) nebulizer solution  0.25 mg Nebulization BID   carvedilol  25 mg Oral QHS   Chlorhexidine Gluconate Cloth  6 each Topical Q2200   enoxaparin (LOVENOX) injection  40 mg Subcutaneous Q24H   fluticasone  2 spray Each  Nare Daily   insulin aspart  0-15 Units Subcutaneous TID WC   ipratropium-albuterol  3 mL Nebulization QID   levothyroxine  125 mcg Oral QAC breakfast   Continuous:   SLP:NPYYFRTMY, dextrose,  nitroGLYCERIN, mouth rinse  Antibiotics: Anti-infectives (From admission, onward)    None       Objective:  Vital Signs  Vitals:   04/09/23 1450 04/09/23 1951 04/09/23 2116 04/10/23 0455  BP:   114/63 119/72  Pulse:   83 78  Resp:   (!) 24 20  Temp:   99.1 F (37.3 C) 97.7 F (36.5 C)  TempSrc:   Oral Oral  SpO2: 98% 94% 98% 100%  Weight:    102.9 kg  Height:        Intake/Output Summary (Last 24 hours) at 04/10/2023 0828 Last data filed at 04/10/2023 0500 Gross per 24 hour  Intake 53.49 ml  Output 500 ml  Net -446.51 ml    Filed Weights   04/07/23 1448 04/10/23 0455  Weight: 110 kg 102.9 kg    General appearance: Awake alert.  In no distress Resp: Less tachypneic.  Improved effort.  Less wheezing today. Cardio: S1-S2 is normal regular.  No S3-S4.  No rubs murmurs or bruit GI: Abdomen is soft.  Nontender nondistended.  Bowel sounds are present normal.  No masses organomegaly Extremities: No edema.  Moving all of his extremities.  Physical deconditioning is noted. Neurologic:  No focal neurological deficits.      Lab Results:  Data Reviewed: I have personally reviewed following labs and reports of the imaging studies  CBC: Recent Labs  Lab 04/07/23 1455 04/07/23 1716 04/07/23 2326 04/09/23 0309  WBC 2.5*  --  2.5* 2.7*  NEUTROABS  --   --  1.7  --   HGB 12.4* 13.6 12.1* 10.5*  HCT 39.5 40.0 37.8* 32.6*  MCV 95.6  --  95.2 96.4  PLT 207  --  180 209     Basic Metabolic Panel: Recent Labs  Lab 04/07/23 2326 04/08/23 0130 04/08/23 0517 04/09/23 0309 04/10/23 0652  NA 140 140 139 138 138  K 3.4* 3.4* 3.5 3.6 3.7  CL 108 109 112* 109 109  CO2 21* 22 23 22 22   GLUCOSE 256* 172* 172* 188* 101*  BUN 29* 28* 24* 24* 18  CREATININE 1.80* 1.72* 1.46* 1.55* 1.38*  CALCIUM 8.1* 8.4* 7.9* 8.0* 8.0*     GFR: Estimated Creatinine Clearance: 51.3 mL/min (A) (by C-G formula based on SCr of 1.38 mg/dL (H)).   CBG: Recent Labs  Lab  04/09/23 0734 04/09/23 1144 04/09/23 1639 04/09/23 2115 04/10/23 0724  GLUCAP 163* 224* 176* 137* 116*      Radiology Studies: DG CHEST PORT 1 VIEW  Result Date: 04/08/2023 CLINICAL DATA:  Dyspnea EXAM: PORTABLE CHEST 1 VIEW COMPARISON:  April 9, 24. FINDINGS: Similar cardiomegaly. Suspect small bilateral pleural effusions. Mild overlying opacities. No visible pneumothorax. CABG and median sternotomy. IMPRESSION: 1. Suspect small bilateral pleural effusions and mild overlying opacities, most likely atelectasis. 2. Cardiomegaly. Electronically Signed   By: Feliberto Harts M.D.   On: 04/08/2023 11:49       LOS: 3 days   Vertis Scheib Rito Ehrlich  [facility] Pager on [email or web address]  04/10/2023, 8:28 AM

## 2023-04-10 NOTE — Progress Notes (Signed)
Physical Therapy Treatment Patient Details Name: Paul [name] MRN: [id number] DOB: Jan 05, 1946 Today's Date: 04/10/2023   History of Present Illness Pt is 77 yo male admitted 04/07/23 with hyperosmolar hyperglycemia and metabolic encephalopathy in context of missed insulin dose. Pt with hx including but not limited to  lung cancer, hypothyroidism, essential hypertension, type 2 diabetes insulin-dependent, systolic dysfunction CHF with a EF of 30%, coronary artery disease status post CABG, COPD and recent pneumonia    PT Comments    Assisted pt to transfer from recliner to ambulate in hallway. +2 assist for safety and equipment. Family followed with the recliner. Ambulated 40x2 with a sitting rest break. Walked 40 ft on room air.  Vitals taken prior and during session. Pt left with nursing in room in recliner with chair alarm on.  Vitals:  2L O2 at rest: 98% 76 HR Room air during gait: 87% average, lowest 84%, 90 HR Reapplied 2L during gait, average O2 90%, 90 HR   Recommendations for follow up therapy are one component of a multi-disciplinary discharge planning process, led by the attending physician.  Recommendations may be updated based on patient status, additional functional criteria and insurance authorization.  Follow Up Recommendations  Can patient physically be transported by private vehicle: Yes    Assistance Recommended at Discharge Frequent or constant Supervision/Assistance  Patient can return home with the following A little help with walking and/or transfers;A little help with bathing/dressing/bathroom;Assistance with cooking/housework;Help with stairs or ramp for entrance   Equipment Recommendations  Rolling walker (2 wheels)    Recommendations for Other Services       Precautions / Restrictions Precautions Precautions: Fall Precaution Comments: watch O2 sats on RA Restrictions Weight Bearing Restrictions: No     Mobility  Bed Mobility                     Transfers Overall transfer level: Needs assistance Equipment used: Rolling walker (2 wheels) Transfers: Sit to/from Stand Sit to Stand: +2 physical assistance, +2 safety/equipment, Mod assist, Min assist   Step pivot transfers: Mod assist, +2 physical assistance, +2 safety/equipment, Min assist       General transfer comment: Lots of verbal cues for initiation and hand placement    Ambulation/Gait Ambulation/Gait assistance: Mod assist, +2 physical assistance, +2 safety/equipment Gait Distance (Feet): 80 Feet (40x2 with sitting break) Assistive device: Rolling walker (2 wheels) Gait Pattern/deviations: Step-to pattern, Decreased step length - right, Decreased step length - left, Antalgic, Trunk flexed Gait velocity: decreased     General Gait Details: VCs for distance of RW and navigating hallway   Stairs             Wheelchair Mobility    Modified Rankin (Stroke Patients Only)       Balance                                            Cognition Arousal/Alertness: Awake/alert Behavior During Therapy: Flat affect Overall Cognitive Status: Within Functional Limits for tasks assessed Area of Impairment: Attention, Following commands, Safety/judgement, Problem solving                   Current Attention Level: Sustained   Following Commands: Follows one step commands with increased time Safety/Judgement: Decreased awareness of deficits   Problem Solving: Slow processing, Requires verbal cues General Comments: Pt oriented x 3 and  follows commands. Does require increased time to respond and cues for initiation of task.        Exercises      General Comments        Pertinent Vitals/Pain Pain Assessment Pain Score: 0-No pain    Home Living                          Prior Function            PT Goals (current goals can now be found in the care plan section) Acute Rehab PT Goals Patient Stated Goal: Return  home PT Goal Formulation: With patient Time For Goal Achievement: 04/22/23 Potential to Achieve Goals: Good Progress towards PT goals: Progressing toward goals    Frequency    Min 1X/week      PT Plan Current plan remains appropriate    Co-evaluation              AM-PAC PT "6 Clicks" Mobility   Outcome Measure  Help needed turning from your back to your side while in a flat bed without using bedrails?: A Little Help needed moving from lying on your back to sitting on the side of a flat bed without using bedrails?: A Lot Help needed moving to and from a bed to a chair (including a wheelchair)?: A Little Help needed standing up from a chair using your arms (e.g., wheelchair or bedside chair)?: A Little Help needed to walk in hospital room?: Total Help needed climbing 3-5 steps with a railing? : Total 6 Click Score: 13    End of Session Equipment Utilized During Treatment: Gait belt Activity Tolerance: Patient tolerated treatment well Patient left: with chair alarm set;in chair;with call bell/phone within reach;with family/visitor present   PT Visit Diagnosis: Other abnormalities of gait and mobility (R26.89);Muscle weakness (generalized) (M62.81)     Time: 5053-9767 PT Time Calculation (min) (ACUTE ONLY): 24 min  Charges:  $Gait Training: 8-22 mins $Therapeutic Activity: 8-22 mins                     Sharlene Motts, SPTA

## 2023-04-10 NOTE — Progress Notes (Signed)
Mobility Specialist - Progress Note   04/10/23 1105  Mobility  Activity Transferred from bed to chair  Level of Assistance Minimal assist, patient does 75% or more  Assistive Device None  Distance Ambulated (ft) 5 ft  Activity Response Tolerated well  Mobility Referral Yes  $Mobility charge 1 Mobility   Pt received in bed and was eager to transfer. Pt was Min A for bed mobility, Min A for sit to stand and contact for short small steps to recliner. Left in chair with all needs met, and alarm on.   Marilynne Halsted Mobility Specialist

## 2023-04-10 NOTE — Care Management Important Message (Signed)
Important Message  Patient Details IM Letter given. Name: Milek Crissman MRN: [id number] Date of Birth: 1946/11/22   Medicare Important Message Given:  Yes     Caren Macadam 04/10/2023, 12:12 PM

## 2023-04-10 NOTE — NC FL2 (Signed)
[place] MEDICAID FL2 LEVEL OF CARE FORM     IDENTIFICATION  Patient Name: Paul [name] Birthdate: 11-14-1946 Sex: male Admission Date (Current Location): 04/07/2023  [facility] and [place] Number:  Producer, television/film/video and Address:  [facility],  [address] [place], [place] [address]      Provider Number: [id number]  Attending Physician Name and Address:  Osvaldo Shipper, MD  Relative Name and Phone Number:  daughter, Ralph Leyden   [phone]    Current Level of Care: Hospital Recommended Level of Care: Skilled Nursing Facility Prior Approval Number:    Date Approved/Denied:   PASRR Number: [id number]  Discharge Plan: SNF    Current Diagnoses: Patient Active Problem List   Diagnosis Date Noted   Type 2 diabetes mellitus with chronic kidney disease, with long-term current use of insulin 03/18/2023   Community acquired bacterial pneumonia 03/16/2023   Shortness of breath 03/12/2023   AKI (acute kidney injury) 07/10/2022   Hyperkalemia 07/10/2022   Hypokalemia 07/10/2022   DKA (diabetic ketoacidosis) 07/09/2022   Cough 06/04/2022   Goals of care, counseling/discussion 10/23/2021   Hyperosmolar hyperglycemic state (HHS) 06/23/2021   Hyperglycemia due to diabetes mellitus 06/22/2021   GERD (gastroesophageal reflux disease) 06/22/2021   Essential hypertension 06/22/2021   Hyperlipidemia 06/22/2021   Adenocarcinoma of right lung, stage 3 06/17/2021   Encounter for antineoplastic chemotherapy 06/17/2021   Encounter for antineoplastic immunotherapy 06/17/2021   Lung nodule 05/17/2021   Stage 3a chronic kidney disease (CKD) 09/03/2018   CAD, multiple vessel 09/03/2018    Orientation RESPIRATION BLADDER Height & Weight     Self, Time, Situation, Place  O2 Continent Weight: 226 lb 13.7 oz (102.9 kg) Height:  5\' 8"  (172.7 cm)  BEHAVIORAL SYMPTOMS/MOOD NEUROLOGICAL BOWEL NUTRITION STATUS      Continent Diet (See DC summary)  AMBULATORY STATUS COMMUNICATION  OF NEEDS Skin   Limited Assist Verbally Normal                       Personal Care Assistance Level of Assistance  Bathing, Feeding, Dressing Bathing Assistance: Limited assistance Feeding assistance: Limited assistance Dressing Assistance: Limited assistance     Functional Limitations Info  Sight, Speech, Hearing Sight Info: Adequate Hearing Info: Adequate Speech Info: Adequate    SPECIAL CARE FACTORS FREQUENCY  PT (By licensed PT), OT (By licensed OT)     PT Frequency: 5x/wk OT Frequency: 5x/wk            Contractures Contractures Info: Not present    Additional Factors Info  Code Status, Allergies Code Status Info: FULL Allergies Info: No Known Allergies           Current Medications (04/10/2023):  This is the current hospital active medication list Current Facility-Administered Medications  Medication Dose Route Frequency Provider Last Rate Last Admin   albuterol (PROVENTIL) (2.5 MG/3ML) 0.083% nebulizer solution 2.5 mg  2.5 mg Nebulization Q6H PRN Earlie Lou L, MD   2.5 mg at 04/08/23 1332   aspirin chewable tablet 81 mg  81 mg Oral Daily Osvaldo Shipper, MD   81 mg at 04/10/23 0919   atorvastatin (LIPITOR) tablet 40 mg  40 mg Oral Daily Earlie Lou L, MD   40 mg at 04/10/23 0920   budesonide (PULMICORT) nebulizer solution 0.25 mg  0.25 mg Nebulization BID Osvaldo Shipper, MD   0.25 mg at 04/10/23 8413   carvedilol (COREG) tablet 25 mg  25 mg Oral QHS Rometta Emery, MD  25 mg at 04/09/23 2136   Chlorhexidine Gluconate Cloth 2 % PADS 6 each  6 each Topical Q2200 Osvaldo Shipper, MD   6 each at 04/09/23 2136   dextrose 50 % solution 0-50 mL  0-50 mL Intravenous PRN Earlie Lou L, MD   25 mL at 04/08/23 1553   enoxaparin (LOVENOX) injection 40 mg  40 mg Subcutaneous Q24H Earlie Lou L, MD   40 mg at 04/10/23 0920   fluticasone (FLONASE) 50 MCG/ACT nasal spray 2 spray  2 spray Each Nare Daily Osvaldo Shipper, MD   2 spray at 04/10/23 0920    insulin aspart (novoLOG) injection 0-15 Units  0-15 Units Subcutaneous TID WC Luiz Iron, NP   2 Units at 04/10/23 1211   ipratropium-albuterol (DUONEB) 0.5-2.5 (3) MG/3ML nebulizer solution 3 mL  3 mL Nebulization QID Osvaldo Shipper, MD   3 mL at 04/10/23 1106   levothyroxine (SYNTHROID) tablet 125 mcg  125 mcg Oral QAC breakfast Osvaldo Shipper, MD   125 mcg at 04/10/23 0530   nitroGLYCERIN (NITROSTAT) SL tablet 0.4 mg  0.4 mg Sublingual Q5 min PRN Rometta Emery, MD       Oral care mouth rinse  15 mL Mouth Rinse PRN Osvaldo Shipper, MD         Discharge Medications: Please see discharge summary for a list of discharge medications.  Relevant Imaging Results:  Relevant Lab Results:   Additional Information SSN: [id number]  Otelia Santee, LCSW

## 2023-04-10 NOTE — TOC Progression Note (Signed)
Transition of Care [facility]) - Progression Note    Patient Details  Name: Paul [name] MRN: [id number] Date of Birth: [birth year]  [facility] CM/SW Contact  Otelia Santee, LCSW Phone Number: 04/10/2023, 2:09 PM  Clinical Narrative:    Met with pt and daughter in room to discuss discharge recommendations. Pt and daughter agreeable to SNF placement. Pt shares he has not been to SNF in the past and they currently do not have SNF preference. Pt's daughter lives in [place], [place] and will be in town until Sunday 4/14.  Referrals have been sent out for SNF and currently awaiting bed offers.    Expected Discharge Plan: Skilled Nursing Facility Barriers to Discharge: Continued Medical Work up  Expected Discharge Plan and Services In-house Referral: NA Discharge Planning Services: CM Consult   Living arrangements for the past 2 months: Apartment                 DME Arranged: Walker rolling                     Social Determinants of Health (SDOH) Interventions SDOH Screenings   Food Insecurity: No Food Insecurity (04/08/2023)  Housing: Low Risk  (04/08/2023)  Transportation Needs: No Transportation Needs (04/08/2023)  Utilities: Not At Risk (04/08/2023)  Financial Resource Strain: Low Risk  (11/22/2021)  Social Connections: Moderately Isolated (11/22/2021)  Tobacco Use: High Risk (04/07/2023)    Readmission Risk Interventions    04/08/2023    4:58 PM  Readmission Risk Prevention Plan  Transportation Screening Complete  PCP or Specialist Appt within 5-7 Days Complete  Home Care Screening Complete  Medication Review (RN CM) Complete

## 2023-04-10 NOTE — Plan of Care (Signed)
  Problem: Nutritional: Goal: Maintenance of adequate nutrition will improve Outcome: Progressing   Problem: Cardiac: Goal: Ability to maintain an adequate cardiac output will improve Outcome: Progressing

## 2023-04-11 DIAGNOSIS — E1165 Type 2 diabetes mellitus with hyperglycemia: Secondary | ICD-10-CM | POA: Diagnosis not present

## 2023-04-11 DIAGNOSIS — Z794 Long term (current) use of insulin: Secondary | ICD-10-CM | POA: Diagnosis not present

## 2023-04-11 DIAGNOSIS — J449 Chronic obstructive pulmonary disease, unspecified: Secondary | ICD-10-CM | POA: Diagnosis not present

## 2023-04-11 DIAGNOSIS — I5022 Chronic systolic (congestive) heart failure: Secondary | ICD-10-CM | POA: Diagnosis not present

## 2023-04-11 LAB — CBC
HCT: 32.7 % — ABNORMAL LOW (ref 39.0–52.0)
Hemoglobin: 10.1 g/dL — ABNORMAL LOW (ref 13.0–17.0)
MCH: 30 pg (ref 26.0–34.0)
MCHC: 30.9 g/dL (ref 30.0–36.0)
MCV: 97 fL (ref 80.0–100.0)
Platelets: 281 10*3/uL (ref 150–400)
RBC: 3.37 MIL/uL — ABNORMAL LOW (ref 4.22–5.81)
RDW: 15.1 % (ref 11.5–15.5)
WBC: 4.2 10*3/uL (ref 4.0–10.5)
nRBC: 0 % (ref 0.0–0.2)

## 2023-04-11 LAB — BASIC METABOLIC PANEL
Anion gap: 8 (ref 5–15)
BUN: 18 mg/dL (ref 8–23)
CO2: 24 mmol/L (ref 22–32)
Calcium: 8.4 mg/dL — ABNORMAL LOW (ref 8.9–10.3)
Chloride: 107 mmol/L (ref 98–111)
Creatinine, Ser: 1.34 mg/dL — ABNORMAL HIGH (ref 0.61–1.24)
GFR, Estimated: 54 mL/min — ABNORMAL LOW (ref >60)
Glucose, Bld: 153 mg/dL — ABNORMAL HIGH (ref 70–99)
Potassium: 3.8 mmol/L (ref 3.5–5.1)
Sodium: 139 mmol/L (ref 135–145)

## 2023-04-11 LAB — GLUCOSE, CAPILLARY
Glucose-Capillary: 167 mg/dL — ABNORMAL HIGH (ref 70–99)
Glucose-Capillary: 182 mg/dL — ABNORMAL HIGH (ref 70–99)
Glucose-Capillary: 149 mg/dL — ABNORMAL HIGH (ref 70–99)
Glucose-Capillary: 212 mg/dL — ABNORMAL HIGH (ref 70–99)

## 2023-04-11 MED ORDER — TRIAMCINOLONE ACETONIDE 0.1 % EX CREA
1.0000 | TOPICAL_CREAM | Freq: Two times a day (BID) | CUTANEOUS | Status: DC | PRN
  Filled 2023-04-11: qty 15

## 2023-04-11 MED ORDER — INSULIN ASPART 100 UNIT/ML IJ SOLN
2.0000 [IU] | Freq: Once | INTRAMUSCULAR | Status: AC
  Administered 2023-04-11: 2 [IU] via SUBCUTANEOUS

## 2023-04-11 NOTE — Progress Notes (Signed)
[facility] PROGRESS NOTE   Paul [name] QZE:[id number] DOB: [birth year] DOA: 04/07/2023  PCP: [name]  Brief History/Interval Summary: 77 y.o. 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  His roommate alerted EMS.  He was noted to have elevated glucose levels.  He mentioned that he forgot to take his insulin.    Consultants: None  Procedures: None    Subjective/Interval History: Patient wants to go home but understands the need for rehabilitation.  Denies any complaints.  Cough is getting better.  Denies any shortness of breath.    Assessment/Plan:  Hyperosmolar hyperglycemia in the setting of known history of insulin-dependent diabetes mellitus Hyperglycemia was in the setting of noncompliance.  HbA1c was 8.3 on March 21. Patient was given IV fluids and started on insulin infusion.  Transitioned to subcutaneous insulin.   Subsequently developed hypoglycemic episodes.  Insulin had to be discontinued.   Encourage oral intake.  CBGs are stable for the most part.  Just leave him on SSI for now.  Hold off on basal insulin to avoid hypoglycemic episodes.  Since it is likely he will be going to a skilled nursing facility and will be in a supervised setting for short-term it can be monitored that as well.  Perhaps he can just be on Jardiance which she was getting for his CHF.  Acute metabolic encephalopathy Secondary to hyperglycemia.  Did not have any focal neurological deficits.  Appears to be physically deconditioned.   PT and OT has been consulted.  Skilled nursing facility is recommended for short-term rehab. Mentation seems to be back to baseline.  Chronic systolic CHF/coronary artery disease Last echocardiogram is  from 2022 which showed left ventricular EF of 30 to 35% with grade 1 diastolic dysfunction. Prior to admission looks like patient was on carvedilol and Entresto.  Entresto currently on hold.  Patient was also on Jardiance which is also currently on hold.  It does not appear that patient was getting any scheduled diuretics. Seems to be fairly well compensated.  Continue to monitor for now.  History of COPD/acute respiratory failure with hypoxia Recently admitted for pneumonia and COPD exacerbation.  However he continues to smoke cigarettes.  He was counseled.  Does not use oxygen at home.  Respiratory status has improved.  Wheezing is less than before.  Wean down oxygen to maintain saturation greater than 90%. Continue with incentive spirometry.  No clear indication to reinitiate antibacterials.  Acute kidney injury on chronic kidney disease stage IIIa Baseline creatinine seems to be around 1.5.  Came in with elevated creatinine.   Renal function is now back to baseline.  Leukopenia/normocytic anemia Labs are stable.  GERD Continue PPI  Lung cancer Follow-up with oncology.  Obesity Estimated body mass index is 37.91 kg/m as calculated from the following:   Height as of this encounter: 5\' 8"  (1.727 m).   Weight as of this encounter: 113.1 kg.  DVT Prophylaxis: Lovenox Code Status: Full code Family Communication: Discussed with patient.  Discussed with daughter previously. Disposition Plan: Seems to be medically stable.  Okay for discharge to SNF when bed is available.  Status is: Inpatient Remains inpatient appropriate because: Acute metabolic encephalopathy, hyperglycemia      Medications: Scheduled:  aspirin  81 mg Oral Daily   atorvastatin  40  mg Oral Daily   budesonide (PULMICORT) nebulizer solution  0.25 mg Nebulization BID   carvedilol  25 mg Oral QHS   Chlorhexidine Gluconate Cloth  6 each Topical Q2200   enoxaparin (LOVENOX) injection  40 mg Subcutaneous Q24H    fluticasone  2 spray Each Nare Daily   insulin aspart  0-15 Units Subcutaneous TID WC   ipratropium-albuterol  3 mL Nebulization QID   levothyroxine  125 mcg Oral QAC breakfast   Continuous:   TOI:[id number], dextrose, nitroGLYCERIN, mouth rinse  Antibiotics: Anti-infectives (From admission, onward)    None       Objective:  Vital Signs  Vitals:   04/10/23 2122 04/11/23 0432 04/11/23 0500 04/11/23 0750  BP: 116/69 126/77    Pulse: 80 78    Resp: 20 20    Temp: 97.6 F (36.4 C) 97.6 F (36.4 C)    TempSrc: Oral Oral    SpO2: 94% 94%  95%  Weight:   113.1 kg   Height:        Intake/Output Summary (Last 24 hours) at 04/11/2023 1000 Last data filed at 04/11/2023 0643 Gross per 24 hour  Intake 712 ml  Output --  Net 712 ml    Filed Weights   04/07/23 1448 04/10/23 0455 04/11/23 0500  Weight: 110 kg 102.9 kg 113.1 kg    General appearance: Awake alert.  In no distress Resp: Improved effort.  Few scattered wheezes.  No rhonchi. Cardio: S1-S2 is normal regular.  No S3-S4.  No rubs murmurs or bruit GI: Abdomen is soft.  Nontender nondistended.  Bowel sounds are present normal.  No masses organomegaly Extremities: No edema.   No obvious focal neurological deficits.    Lab Results:  Data Reviewed: I have personally reviewed following labs and reports of the imaging studies  CBC: Recent Labs  Lab 04/07/23 1455 04/07/23 1716 04/07/23 2326 04/09/23 0309 04/11/23 0543  WBC 2.5*  --  2.5* 2.7* 4.2  NEUTROABS  --   --  1.7  --   --   HGB 12.4* 13.6 12.1* 10.5* 10.1*  HCT 39.5 40.0 37.8* 32.6* 32.7*  MCV 95.6  --  95.2 96.4 97.0  PLT 207  --  180 209 281     Basic Metabolic Panel: Recent Labs  Lab 04/08/23 0130 04/08/23 0517 04/09/23 0309 04/10/23 0652 04/11/23 0543  NA 140 139 138 138 139  K 3.4* 3.5 3.6 3.7 3.8  CL 109 112* 109 109 107  CO2 22 23 22 22 24   GLUCOSE 172* 172* 188* 101* 153*  BUN 28* 24* 24* 18 18  CREATININE 1.72* 1.46* 1.55*  1.38* 1.34*  CALCIUM 8.4* 7.9* 8.0* 8.0* 8.4*     GFR: Estimated Creatinine Clearance: 55.5 mL/min (A) (by C-G formula based on SCr of 1.34 mg/dL (H)).   CBG: Recent Labs  Lab 04/10/23 0724 04/10/23 1153 04/10/23 1643 04/10/23 2110 04/11/23 0752  GLUCAP 116* 140* 170* 173* 167*      Radiology Studies: No results found.     LOS: 4 days   Traylon Schimming [employer] on [email or web address]  04/11/2023, 10:00 AM

## 2023-04-11 NOTE — TOC Progression Note (Signed)
Transition of Care [facility]) - Progression Note    Patient Details  Name: Paul [name] MRN: [id number] Date of Birth: [birth year]  [facility] CM/SW Contact  Otelia Santee, LCSW Phone Number: 04/11/2023, 12:54 PM  Clinical Narrative:    Met with pt and daughter in room to review bed offers. Pt and family have accepted offer at [facility]. Awaiting response from [facility] regarding acceptance date. Pt able to transfer to SNF when medically ready and bed confirmed with [facility].    Expected Discharge Plan: Skilled Nursing Facility Barriers to Discharge: Continued Medical Work up  Expected Discharge Plan and Services In-house Referral: NA Discharge Planning Services: CM Consult   Living arrangements for the past 2 months: Apartment                 DME Arranged: Walker rolling                     Social Determinants of Health (SDOH) Interventions SDOH Screenings   Food Insecurity: No Food Insecurity (04/08/2023)  Housing: Low Risk  (04/08/2023)  Transportation Needs: No Transportation Needs (04/08/2023)  Utilities: Not At Risk (04/08/2023)  Financial Resource Strain: Low Risk  (11/22/2021)  Social Connections: Moderately Isolated (11/22/2021)  Tobacco Use: High Risk (04/07/2023)    Readmission Risk Interventions    04/08/2023    4:58 PM  Readmission Risk Prevention Plan  Transportation Screening Complete  PCP or Specialist Appt within 5-7 Days Complete  Home Care Screening Complete  Medication Review (RN CM) Complete

## 2023-04-11 NOTE — Plan of Care (Signed)

## 2023-04-12 DIAGNOSIS — E1100 Type 2 diabetes mellitus with hyperosmolarity without nonketotic hyperglycemic-hyperosmolar coma (NKHHC): Secondary | ICD-10-CM | POA: Diagnosis not present

## 2023-04-12 LAB — GLUCOSE, CAPILLARY
Glucose-Capillary: 140 mg/dL — ABNORMAL HIGH (ref 70–99)
Glucose-Capillary: 195 mg/dL — ABNORMAL HIGH (ref 70–99)
Glucose-Capillary: 140 mg/dL — ABNORMAL HIGH (ref 70–99)
Glucose-Capillary: 134 mg/dL — ABNORMAL HIGH (ref 70–99)

## 2023-04-12 MED ORDER — SENNOSIDES-DOCUSATE SODIUM 8.6-50 MG PO TABS: 2.0000 | ORAL_TABLET | Freq: Two times a day (BID) | ORAL | Status: DC

## 2023-04-12 MED ORDER — POLYETHYLENE GLYCOL 3350 17 G PO PACK
17.0000 g | PACK | Freq: Every day | ORAL | Status: DC
  Administered 2023-04-12 – 2023-04-14 (×3): 17 g via ORAL
  Filled 2023-04-12 (×3): qty 1

## 2023-04-12 MED ORDER — GUAIFENESIN ER 600 MG PO TB12
600.0000 mg | ORAL_TABLET | Freq: Two times a day (BID) | ORAL | Status: DC
  Administered 2023-04-12 – 2023-04-14 (×5): 600 mg via ORAL
  Filled 2023-04-12 (×5): qty 1

## 2023-04-12 MED ORDER — SENNOSIDES-DOCUSATE SODIUM 8.6-50 MG PO TABS
2.0000 | ORAL_TABLET | Freq: Two times a day (BID) | ORAL | Status: DC
  Administered 2023-04-12 – 2023-04-14 (×5): 2 via ORAL
  Filled 2023-04-12 (×5): qty 2

## 2023-04-12 MED ORDER — FLUTICASONE PROPIONATE 50 MCG/ACT NA SUSP: 2.0000 | Freq: Every day | NASAL | 2 refills | Status: AC

## 2023-04-12 MED ORDER — IPRATROPIUM-ALBUTEROL 0.5-2.5 (3) MG/3ML IN SOLN: 3.0000 mL | RESPIRATORY_TRACT | Status: DC | PRN

## 2023-04-12 MED ORDER — GUAIFENESIN ER 600 MG PO TB12: 600.0000 mg | ORAL_TABLET | Freq: Two times a day (BID) | ORAL | Status: DC

## 2023-04-12 MED ORDER — POLYETHYLENE GLYCOL 3350 17 G PO PACK: 17.0000 g | PACK | Freq: Every day | ORAL | 0 refills | Status: DC

## 2023-04-12 NOTE — Discharge Summary (Addendum)
[facility]  Physician Discharge Summary   Patient ID: Paul [name] MRN: [id number] DOB/AGE: 79/30/1947 79 y.o.  Admit date: 04/07/2023 Discharge date:   04/12/2023   [facility]: [name]  DISCHARGE DIAGNOSES:    Hyperosmolar hyperglycemic state (HHS)   Adenocarcinoma of right lung, stage 3   GERD (gastroesophageal reflux disease)   Essential hypertension   Hyperlipidemia   Stage 3a chronic kidney disease (CKD)   CAD, multiple vessel   RECOMMENDATIONS FOR OUTPATIENT FOLLOW UP: Check CBC and basic metabolic panel in 3 to 4 days Patient will need follow-up at the [facility] for his regular care as well as for care of his lung cancer once he is released from short-term rehab Check CBGs as per protocol with SSI.   Home Health: Going to SNF Equipment/Devices: None  CODE STATUS: Full code  DISCHARGE CONDITION: fair  Diet recommendation: Heart healthy  INITIAL HISTORY: 77 y.o. 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  His roommate alerted EMS.  He was noted to have elevated glucose levels.  He mentioned that he forgot to take his insulin.     HOSPITAL COURSE:   Diabetes mellitus type 2, uncontrolled with hyper and hypoglycemia Hyperglycemia was in the setting of noncompliance.  HbA1c was 8.3 on March 21. Patient was given IV fluids and started on insulin infusion.  Transitioned to subcutaneous insulin.   Subsequently developed hypoglycemic episodes.  Insulin had to be discontinued.   CBGs have stabilized.  He is just on SSI.  At discharge he can be started back on his Jardiance and metformin.  Hold off on basal insulin.  Monitor CBGs at skilled nursing facility to determine further adjustments to treatment.   Acute  metabolic encephalopathy Secondary to hyperglycemia.  Did not have any focal neurological deficits.  Appears to be physically deconditioned.   PT and OT has been consulted.  Skilled nursing facility is recommended for short-term rehab. Mentation seems to be back to baseline.   Chronic systolic CHF/coronary artery disease Last echocardiogram is from 2022 which showed left ventricular EF of 30 to 35% with grade 1 diastolic dysfunction. Prior to admission looks like patient was on carvedilol and Entresto.  Entresto currently on hold.  Patient was also on Jardiance which is also currently on hold.  It does not appear that patient was getting any scheduled diuretics. Seems to be fairly well compensated.  May resume Entresto at discharge.  Renal function will need to be monitored closely.   History of COPD/acute respiratory failure with hypoxia Recently admitted for pneumonia and COPD exacerbation.  However he continues to smoke cigarettes.  He was counseled.  Does not use oxygen at home.  Respiratory status has improved.  Will try to get him off of oxygen.  If he desaturates then he will need oxygen at discharge. Continue with incentive spirometry.    Acute kidney injury on chronic kidney disease stage IIIa Baseline creatinine seems to be around 1.5.  Came in with elevated creatinine.   Renal function is now back to baseline.   Leukopenia/normocytic anemia Labs are stable.   GERD Continue PPI   Lung cancer Follow-up with oncology when released from rehab.   Obesity Estimated body mass index is 37.91 kg/m as calculated from the following:   Height as of this encounter:  5\' 8"  (1.727 m).   Weight as of this encounter: 113.1 kg.   Patient is stable.  Okay for discharge to short-term rehab when bed is available.   PERTINENT LABS:  The results of significant diagnostics from this hospitalization (including imaging, microbiology, ancillary and laboratory) are listed below for reference.      Labs:   Basic Metabolic Panel: Recent Labs  Lab 04/08/23 0130 04/08/23 0517 04/09/23 0309 04/10/23 0652 04/11/23 0543  NA 140 139 138 138 139  K 3.4* 3.5 3.6 3.7 3.8  CL 109 112* 109 109 107  CO2 22 23 22 22 24   GLUCOSE 172* 172* 188* 101* 153*  BUN 28* 24* 24* 18 18  CREATININE 1.72* 1.46* 1.55* 1.38* 1.34*  CALCIUM 8.4* 7.9* 8.0* 8.0* 8.4*    CBC: Recent Labs  Lab 04/07/23 1455 04/07/23 1716 04/07/23 2326 04/09/23 0309 04/11/23 0543  WBC 2.5*  --  2.5* 2.7* 4.2  NEUTROABS  --   --  1.7  --   --   HGB 12.4* 13.6 12.1* 10.5* 10.1*  HCT 39.5 40.0 37.8* 32.6* 32.7*  MCV 95.6  --  95.2 96.4 97.0  PLT 207  --  180 209 281     CBG: Recent Labs  Lab 04/11/23 0752 04/11/23 1202 04/11/23 1653 04/11/23 2103 04/12/23 0755  GLUCAP 167* 182* 149* 212* 140*     IMAGING STUDIES DG CHEST PORT 1 VIEW  Result Date: 04/08/2023 CLINICAL DATA:  Dyspnea EXAM: PORTABLE CHEST 1 VIEW COMPARISON:  April 9, 24. FINDINGS: Similar cardiomegaly. Suspect small bilateral pleural effusions. Mild overlying opacities. No visible pneumothorax. CABG and median sternotomy. IMPRESSION: 1. Suspect small bilateral pleural effusions and mild overlying opacities, most likely atelectasis. 2. Cardiomegaly. Electronically Signed   By: Feliberto Harts M.D.   On: 04/08/2023 11:49   DG Chest Portable 1 View  Result Date: 04/07/2023 CLINICAL DATA:  Altered mental status. EXAM: PORTABLE CHEST 1 VIEW COMPARISON:  March 16, 2023. FINDINGS: Stable cardiomegaly. Status post coronary bypass graft. Lungs are clear. Bony thorax is unremarkable. IMPRESSION: No active disease. Electronically Signed   By: Lupita Raider M.D.   On: 04/07/2023 15:51   CT Angio Chest PE W and/or Wo Contrast  Result Date: 03/16/2023 CLINICAL DATA:  History of lung cancer and current chemotherapy, presenting with cough, weakness and shortness of breath. EXAM: CT ANGIOGRAPHY CHEST WITH CONTRAST TECHNIQUE: Multidetector CT imaging  of the chest was performed using the standard protocol during bolus administration of intravenous contrast. Multiplanar CT image reconstructions and MIPs were obtained to evaluate the vascular anatomy. RADIATION DOSE REDUCTION: This exam was performed according to the departmental dose-optimization program which includes automated exposure control, adjustment of the mA and/or kV according to patient size and/or use of iterative reconstruction technique. CONTRAST:  34mL OMNIPAQUE IOHEXOL 350 MG/ML SOLN COMPARISON:  December 17, 2022 FINDINGS: Cardiovascular: Stable, marked severity atherosclerotic disease is seen throughout the aortic arch. Satisfactory opacification of the pulmonary arteries to the segmental level. There is tapering of the posterior right lower lobe branch of the right pulmonary artery, without opacification of its distal branches. There is mild cardiomegaly with mild to moderate severity coronary artery calcification. No pericardial effusion. Mediastinum/Nodes: Stable subcentimeter AP window and paratracheal lymph nodes are seen. Thyroid gland, trachea, and esophagus demonstrate no significant findings. Lungs/Pleura: Moderate severity areas of scarring, atelectasis and/or pulmonary fibrosis are seen within the peripheral aspect of the anterior right upper lobe, anteromedial right middle lobe and posteromedial right lower lobe.  Marked severity consolidation and post treatment changes are again seen within the posteromedial aspect of the right lower lobe, from the perihilar region to the right lung base. Predominant stable moderate to marked severity left lower lobe fibrotic scarring and atelectasis is noted. There is a small, partially loculated right pleural effusion. Upper Abdomen: No acute abnormality. Musculoskeletal: Multiple sternal wires are noted. No acute osseous abnormalities are identified. Review of the MIP images confirms the above findings. IMPRESSION: 1. No evidence of pulmonary  embolism. 2. Marked severity right lower lobe consolidation and post treatment changes, as described above. 3. Small, partially loculated right pleural effusion. 4. Findings consistent with underlying pulmonary fibrosis with superimposed areas of abdominal right upper lobe and right middle lobe atelectasis and/or infiltrate. 5. Aortic atherosclerosis. Aortic Atherosclerosis (ICD10-I70.0). Electronically Signed   By: Aram Candela M.D.   On: 03/16/2023 19:00   DG Chest 2 View  Result Date: 03/16/2023 CLINICAL DATA:  Shortness of breath. History of treated non-small-cell lung cancer EXAM: CHEST - 2 VIEW COMPARISON:  X-ray 03/12/2023.  CT 12/17/2022 and older FINDINGS: Status post median sternotomy with enlarged cardiopericardial silhouette. Tortuous ectatic aorta. Vascular congestion. Tiny pleural effusions. No pneumothorax. Overlapping cardiac leads. IMPRESSION: Postop chest. Enlarged cardiopericardial silhouette with some vascular congestion. Tiny pleural effusions. No pneumothorax Electronically Signed   By: Karen Kays M.D.   On: 03/16/2023 14:27    DISCHARGE EXAMINATION: Vitals:   04/11/23 2000 04/11/23 2102 04/12/23 0500 04/12/23 0747  BP:  136/79 130/72   Pulse:  83 75   Resp:  (!) 21 20   Temp:  (!) 97.5 F (36.4 C) 97.6 F (36.4 C)   TempSrc:  Oral Oral   SpO2: 95% 98% 98% 96%  Weight:   107.2 kg   Height:       General appearance: Awake alert.  In no distress Resp: Clear to auscultation bilaterally.  Normal effort Cardio: S1-S2 is normal regular.  No S3-S4.  No rubs murmurs or bruit GI: Abdomen is soft.  Nontender nondistended.  Bowel sounds are present normal.  No masses organomegaly    DISPOSITION: SNF  Discharge Instructions     Call MD for:  difficulty breathing, headache or visual disturbances   Complete by: As directed    Call MD for:  extreme fatigue   Complete by: As directed    Call MD for:  persistant dizziness or light-headedness   Complete by: As directed     Call MD for:  persistant nausea and vomiting   Complete by: As directed    Call MD for:  severe uncontrolled pain   Complete by: As directed    Call MD for:  temperature >100.4   Complete by: As directed    Diet - low sodium heart healthy   Complete by: As directed    Discharge instructions   Complete by: As directed    Please review instructions on the discharge summary.  You were cared for by a hospitalist during your hospital stay. If you have any questions about your discharge medications or the care you received while you were in the hospital after you are discharged, you can call the unit and asked to speak with the hospitalist on call if the hospitalist that took care of you is not available. Once you are discharged, your primary care physician will handle any further medical issues. Please note that NO REFILLS for any discharge medications will be authorized once you are discharged, as it is imperative that you  return to your primary care physician (or establish a relationship with a primary care physician if you do not have one) for your aftercare needs so that they can reassess your need for medications and monitor your lab values. If you do not have a primary care physician, you can call [phone] for a physician referral.   Increase activity slowly   Complete by: As directed    No wound care   Complete by: As directed         Allergies as of 04/12/2023   No Known Allergies      Medication List     STOP taking these medications    benzonatate 200 MG capsule Commonly known as: TESSALON   HYDROcodone bit-homatropine 5-1.5 MG/5ML syrup Commonly known as: HYCODAN   Insulin Glargine-aglr 100 UNIT/ML Sopn   insulin glargine-yfgn 100 UNIT/ML injection Commonly known as: SEMGLEE       TAKE these medications    albuterol 108 (90 Base) MCG/ACT inhaler Commonly known as: VENTOLIN HFA Inhale 2 puffs into the lungs every 6 (six) hours as needed for wheezing or  shortness of breath.   aspirin 81 MG chewable tablet Take 81 mg by mouth daily.   atorvastatin 40 MG tablet Commonly known as: LIPITOR Take 20 mg by mouth at bedtime.   carvedilol 25 MG tablet Commonly known as: COREG Take 25 mg by mouth 2 (two) times daily with a meal.   clobetasol cream 0.05 % Commonly known as: TEMOVATE Apply 1 Application topically 2 (two) times daily as needed (back pain).   [employer] Daily Multiple For Men Tabs Take 1 tablet by mouth daily with breakfast.   empagliflozin 10 MG Tabs tablet Commonly known as: JARDIANCE Take 1 tablet by mouth daily.   fluticasone 50 MCG/ACT nasal spray Commonly known as: FLONASE Place 2 sprays into both nostrils daily. Start taking on: April 13, 2023   fluticasone-salmeterol 230-21 MCG/ACT inhaler Commonly known as: Advair HFA Inhale 2 puffs into the lungs 2 (two) times daily.   guaiFENesin 600 MG 12 hr tablet Commonly known as: MUCINEX Take 1 tablet (600 mg total) by mouth 2 (two) times daily.   ipratropium-albuterol 0.5-2.5 (3) MG/3ML Soln Commonly known as: DUONEB Take 3 mLs by nebulization every 4 (four) hours as needed.   levothyroxine 125 MCG tablet Commonly known as: Synthroid Take 1 tablet (125 mcg total) by mouth daily before breakfast.   lidocaine-prilocaine cream Commonly known as: EMLA Apply 1 application topically as needed. What changed:  when to take this reasons to take this   metFORMIN 500 MG tablet Commonly known as: GLUCOPHAGE Take 1 tablet (500 mg total) by mouth 2 (two) times daily with a meal.   Muscle Rub 10-15 % Crea Apply 1 application topically daily as needed for muscle pain.   nitroGLYCERIN 0.4 MG SL tablet Commonly known as: NITROSTAT Place 0.4 mg under the tongue every 5 (five) minutes as needed for chest pain.   polyethylene glycol 17 g packet Commonly known as: MIRALAX / GLYCOLAX Take 17 g by mouth daily. Start taking on: April 13, 2023   prochlorperazine 10 MG  tablet Commonly known as: COMPAZINE Take 1 tablet (10 mg total) by mouth every 6 (six) hours as needed for nausea or vomiting.   sacubitril-valsartan 49-51 MG Commonly known as: ENTRESTO Take 1 tablet by mouth 2 (two) times daily.   senna-docusate 8.6-50 MG tablet Commonly known as: Senokot-S Take 2 tablets by mouth 2 (two) times daily.   silver sulfADIAZINE  1 % cream Commonly known as: SILVADENE Apply 1 Application topically daily as needed (rash).   triamcinolone cream 0.1 % Commonly known as: KENALOG Apply 1 Application topically 2 (two) times daily as needed (rash).          Contact information for follow-up providers     [facility] Schedule an appointment as soon as possible for a visit.   Why: post hospitalization follow up Contact information: [address] [facility] [place] [address] [phone]              Contact information for after-discharge care     Destination     [facility] .   Service: Skilled Nursing Contact information: [address] [name] [address] [phone]                     TOTAL DISCHARGE TIME: 35 minutes  Domique Reardon Rito Ehrlich  [facility] Pager on [email or web address]  04/12/2023, 10:33 AM

## 2023-04-12 NOTE — Plan of Care (Signed)

## 2023-04-13 DIAGNOSIS — E1100 Type 2 diabetes mellitus with hyperosmolarity without nonketotic hyperglycemic-hyperosmolar coma (NKHHC): Principal | ICD-10-CM

## 2023-04-13 LAB — GLUCOSE, CAPILLARY
Glucose-Capillary: 152 mg/dL — ABNORMAL HIGH (ref 70–99)
Glucose-Capillary: 222 mg/dL — ABNORMAL HIGH (ref 70–99)
Glucose-Capillary: 258 mg/dL — ABNORMAL HIGH (ref 70–99)
Glucose-Capillary: 134 mg/dL — ABNORMAL HIGH (ref 70–99)

## 2023-04-13 NOTE — Care Management Important Message (Signed)
Important Message  Patient Details IM Letter given. Name: Nhan Marandola MRN: [id number] Date of Birth: [birth year]   Medicare Important Message Given:  Yes     Caren Macadam 04/13/2023, 11:22 AM

## 2023-04-13 NOTE — Discharge Summary (Signed)
[facility]  Physician Discharge Summary   Patient ID: Paul [name] MRN: [id number] DOB/AGE: June 01, 1946 77 y.o.  Admit date: 04/07/2023 Discharge date:   04/13/2023   PCP: [name]  DISCHARGE DIAGNOSES:    Hyperosmolar hyperglycemic state (HHS)   Adenocarcinoma of right lung, stage 3   GERD (gastroesophageal reflux disease)   Essential hypertension   Hyperlipidemia   Stage 3a chronic kidney disease (CKD)   CAD, multiple vessel   RECOMMENDATIONS FOR OUTPATIENT FOLLOW UP: Check CBC and basic metabolic panel in 3 to 4 days Patient will need follow-up at the [facility] for his regular care as well as for care of his lung cancer once he is released from short-term rehab Check CBGs as per protocol with SSI.   Home Health: Going to SNF Equipment/Devices: None  CODE STATUS: Full code  DISCHARGE CONDITION: fair  Diet recommendation: Heart healthy  INITIAL HISTORY: 77 y.o. 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  His roommate alerted EMS.  He was noted to have elevated glucose levels.  He mentioned that he forgot to take his insulin.     HOSPITAL COURSE:   Diabetes mellitus type 2, uncontrolled with hyper and hypoglycemia Hyperglycemia was in the setting of noncompliance.  HbA1c was 8.3 on March 21. Patient was given IV fluids and started on insulin infusion.  Transitioned to subcutaneous insulin.   Subsequently developed hypoglycemic episodes.  Insulin had to be discontinued.   CBGs have stabilized.  He is just on SSI.  At discharge he can be started back on his Jardiance and metformin.  Hold off on basal insulin.  Monitor CBGs at skilled nursing facility to determine further adjustments to treatment.   Acute  metabolic encephalopathy Secondary to hyperglycemia.  Did not have any focal neurological deficits.  Appears to be physically deconditioned.   PT and OT has been consulted.  Skilled nursing facility is recommended for short-term rehab. Mentation seems to be back to baseline.   Chronic systolic CHF/coronary artery disease Last echocardiogram is from 2022 which showed left ventricular EF of 30 to 35% with grade 1 diastolic dysfunction. Prior to admission looks like patient was on carvedilol and Entresto.  Entresto currently on hold.  Patient was also on Jardiance which is also currently on hold.  It does not appear that patient was getting any scheduled diuretics. Seems to be fairly well compensated.  May resume Entresto at discharge.  Renal function will need to be monitored closely.   History of COPD/acute respiratory failure with hypoxia Recently admitted for pneumonia and COPD exacerbation.  However he continues to smoke cigarettes.  He was counseled.  Does not use oxygen at home.  Respiratory status is stable.  Noted to be saturating 100% on 1 L.  Should be able to come off of oxygen. Continue with incentive spirometry.    Acute kidney injury on chronic kidney disease stage IIIa Baseline creatinine seems to be around 1.5.  Came in with elevated creatinine.   Renal function is now back to baseline.   Leukopenia/normocytic anemia Labs are stable.   GERD Continue PPI   Lung cancer Follow-up with oncology when released from rehab.   Obesity Estimated body mass index is 37.91 kg/m as calculated from the following:   Height as of this encounter: 5\' 8"  (  1.727 m).   Weight as of this encounter: 113.1 kg.   Patient is stable.  Okay for discharge to short-term rehab when bed is available.   PERTINENT LABS:  The results of significant diagnostics from this hospitalization (including imaging, microbiology, ancillary and laboratory) are listed below for reference.      Labs:   Basic Metabolic Panel: Recent Labs  Lab 04/08/23 0130 04/08/23 0517 04/09/23 0309 04/10/23 0652 04/11/23 0543  NA 140 139 138 138 139  K 3.4* 3.5 3.6 3.7 3.8  CL 109 112* 109 109 107  CO2 22 23 22 22 24   GLUCOSE 172* 172* 188* 101* 153*  BUN 28* 24* 24* 18 18  CREATININE 1.72* 1.46* 1.55* 1.38* 1.34*  CALCIUM 8.4* 7.9* 8.0* 8.0* 8.4*     CBC: Recent Labs  Lab 04/07/23 1455 04/07/23 1716 04/07/23 2326 04/09/23 0309 04/11/23 0543  WBC 2.5*  --  2.5* 2.7* 4.2  NEUTROABS  --   --  1.7  --   --   HGB 12.4* 13.6 12.1* 10.5* 10.1*  HCT 39.5 40.0 37.8* 32.6* 32.7*  MCV 95.6  --  95.2 96.4 97.0  PLT 207  --  180 209 281      CBG: Recent Labs  Lab 04/12/23 0755 04/12/23 1202 04/12/23 1719 04/12/23 2038 04/13/23 0724  GLUCAP 140* 195* 140* 134* 152*      IMAGING STUDIES DG CHEST PORT 1 VIEW  Result Date: 04/08/2023 CLINICAL DATA:  Dyspnea EXAM: PORTABLE CHEST 1 VIEW COMPARISON:  April 9, 24. FINDINGS: Similar cardiomegaly. Suspect small bilateral pleural effusions. Mild overlying opacities. No visible pneumothorax. CABG and median sternotomy. IMPRESSION: 1. Suspect small bilateral pleural effusions and mild overlying opacities, most likely atelectasis. 2. Cardiomegaly. Electronically Signed   By: Feliberto Harts M.D.   On: 04/08/2023 11:49   DG Chest Portable 1 View  Result Date: 04/07/2023 CLINICAL DATA:  Altered mental status. EXAM: PORTABLE CHEST 1 VIEW COMPARISON:  March 16, 2023. FINDINGS: Stable cardiomegaly. Status post coronary bypass graft. Lungs are clear. Bony thorax is unremarkable. IMPRESSION: No active disease. Electronically Signed   By: Lupita Raider M.D.   On: 04/07/2023 15:51   CT Angio Chest PE W and/or Wo Contrast  Result Date: 03/16/2023 CLINICAL DATA:  History of lung cancer and current chemotherapy, presenting with cough, weakness and shortness of breath. EXAM: CT ANGIOGRAPHY CHEST WITH CONTRAST TECHNIQUE: Multidetector CT  imaging of the chest was performed using the standard protocol during bolus administration of intravenous contrast. Multiplanar CT image reconstructions and MIPs were obtained to evaluate the vascular anatomy. RADIATION DOSE REDUCTION: This exam was performed according to the departmental dose-optimization program which includes automated exposure control, adjustment of the mA and/or kV according to patient size and/or use of iterative reconstruction technique. CONTRAST:  80mL OMNIPAQUE IOHEXOL 350 MG/ML SOLN COMPARISON:  December 17, 2022 FINDINGS: Cardiovascular: Stable, marked severity atherosclerotic disease is seen throughout the aortic arch. Satisfactory opacification of the pulmonary arteries to the segmental level. There is tapering of the posterior right lower lobe branch of the right pulmonary artery, without opacification of its distal branches. There is mild cardiomegaly with mild to moderate severity coronary artery calcification. No pericardial effusion. Mediastinum/Nodes: Stable subcentimeter AP window and paratracheal lymph nodes are seen. Thyroid gland, trachea, and esophagus demonstrate no significant findings. Lungs/Pleura: Moderate severity areas of scarring, atelectasis and/or pulmonary fibrosis are seen within the peripheral aspect of the anterior right upper lobe, anteromedial right middle lobe and posteromedial right lower  lobe. Marked severity consolidation and post treatment changes are again seen within the posteromedial aspect of the right lower lobe, from the perihilar region to the right lung base. Predominant stable moderate to marked severity left lower lobe fibrotic scarring and atelectasis is noted. There is a small, partially loculated right pleural effusion. Upper Abdomen: No acute abnormality. Musculoskeletal: Multiple sternal wires are noted. No acute osseous abnormalities are identified. Review of the MIP images confirms the above findings. IMPRESSION: 1. No evidence of  pulmonary embolism. 2. Marked severity right lower lobe consolidation and post treatment changes, as described above. 3. Small, partially loculated right pleural effusion. 4. Findings consistent with underlying pulmonary fibrosis with superimposed areas of abdominal right upper lobe and right middle lobe atelectasis and/or infiltrate. 5. Aortic atherosclerosis. Aortic Atherosclerosis (ICD10-I70.0). Electronically Signed   By: Aram Candela M.D.   On: 03/16/2023 19:00   DG Chest 2 View  Result Date: 03/16/2023 CLINICAL DATA:  Shortness of breath. History of treated non-small-cell lung cancer EXAM: CHEST - 2 VIEW COMPARISON:  X-ray 03/12/2023.  CT 12/17/2022 and older FINDINGS: Status post median sternotomy with enlarged cardiopericardial silhouette. Tortuous ectatic aorta. Vascular congestion. Tiny pleural effusions. No pneumothorax. Overlapping cardiac leads. IMPRESSION: Postop chest. Enlarged cardiopericardial silhouette with some vascular congestion. Tiny pleural effusions. No pneumothorax Electronically Signed   By: Karen Kays M.D.   On: 03/16/2023 14:27    DISCHARGE EXAMINATION: Vitals:   04/12/23 2159 04/13/23 0500 04/13/23 0553 04/13/23 0733  BP: (!) 142/73  (!) 147/71   Pulse: 78  74   Resp:   19   Temp:   98.8 F (37.1 C)   TempSrc:      SpO2:   94% 100%  Weight:  105.5 kg    Height:       General appearance: Awake alert.  In no distress Resp: Scattered wheezing bilaterally.  Normal effort at rest. Cardio: S1-S2 is normal regular.  No S3-S4.  No rubs murmurs or bruit GI: Abdomen is soft.  Nontender nondistended.  Bowel sounds are present normal.  No masses organomegaly    DISPOSITION: SNF  Discharge Instructions     Call MD for:  difficulty breathing, headache or visual disturbances   Complete by: As directed    Call MD for:  extreme fatigue   Complete by: As directed    Call MD for:  persistant dizziness or light-headedness   Complete by: As directed    Call MD for:   persistant nausea and vomiting   Complete by: As directed    Call MD for:  severe uncontrolled pain   Complete by: As directed    Call MD for:  temperature >100.4   Complete by: As directed    Diet - low sodium heart healthy   Complete by: As directed    Discharge instructions   Complete by: As directed    Please review instructions on the discharge summary.  You were cared for by a hospitalist during your hospital stay. If you have any questions about your discharge medications or the care you received while you were in the hospital after you are discharged, you can call the unit and asked to speak with the hospitalist on call if the hospitalist that took care of you is not available. Once you are discharged, your primary care physician will handle any further medical issues. Please note that NO REFILLS for any discharge medications will be authorized once you are discharged, as it is imperative that you return  to your primary care physician (or establish a relationship with a primary care physician if you do not have one) for your aftercare needs so that they can reassess your need for medications and monitor your lab values. If you do not have a primary care physician, you can call [phone] for a physician referral.   Increase activity slowly   Complete by: As directed    No wound care   Complete by: As directed         Allergies as of 04/13/2023   No Known Allergies      Medication List     STOP taking these medications    benzonatate 200 MG capsule Commonly known as: TESSALON   HYDROcodone bit-homatropine 5-1.5 MG/5ML syrup Commonly known as: HYCODAN   Insulin Glargine-aglr 100 UNIT/ML Sopn   insulin glargine-yfgn 100 UNIT/ML injection Commonly known as: SEMGLEE       TAKE these medications    albuterol 108 (90 Base) MCG/ACT inhaler Commonly known as: VENTOLIN HFA Inhale 2 puffs into the lungs every 6 (six) hours as needed for wheezing or shortness of breath.    aspirin 81 MG chewable tablet Take 81 mg by mouth daily.   atorvastatin 40 MG tablet Commonly known as: LIPITOR Take 20 mg by mouth at bedtime.   carvedilol 25 MG tablet Commonly known as: COREG Take 25 mg by mouth 2 (two) times daily with a meal.   clobetasol cream 0.05 % Commonly known as: TEMOVATE Apply 1 Application topically 2 (two) times daily as needed (back pain).   [employer] Daily Multiple For Men Tabs Take 1 tablet by mouth daily with breakfast.   empagliflozin 10 MG Tabs tablet Commonly known as: JARDIANCE Take 1 tablet by mouth daily.   fluticasone 50 MCG/ACT nasal spray Commonly known as: FLONASE Place 2 sprays into both nostrils daily.   fluticasone-salmeterol 230-21 MCG/ACT inhaler Commonly known as: Advair HFA Inhale 2 puffs into the lungs 2 (two) times daily.   guaiFENesin 600 MG 12 hr tablet Commonly known as: MUCINEX Take 1 tablet (600 mg total) by mouth 2 (two) times daily.   ipratropium-albuterol 0.5-2.5 (3) MG/3ML Soln Commonly known as: DUONEB Take 3 mLs by nebulization every 4 (four) hours as needed.   levothyroxine 125 MCG tablet Commonly known as: Synthroid Take 1 tablet (125 mcg total) by mouth daily before breakfast.   lidocaine-prilocaine cream Commonly known as: EMLA Apply 1 application topically as needed. What changed:  when to take this reasons to take this   metFORMIN 500 MG tablet Commonly known as: GLUCOPHAGE Take 1 tablet (500 mg total) by mouth 2 (two) times daily with a meal.   Muscle Rub 10-15 % Crea Apply 1 application topically daily as needed for muscle pain.   nitroGLYCERIN 0.4 MG SL tablet Commonly known as: NITROSTAT Place 0.4 mg under the tongue every 5 (five) minutes as needed for chest pain.   polyethylene glycol 17 g packet Commonly known as: MIRALAX / GLYCOLAX Take 17 g by mouth daily.   prochlorperazine 10 MG tablet Commonly known as: COMPAZINE Take 1 tablet (10 mg total) by mouth every 6 (six) hours as  needed for nausea or vomiting.   sacubitril-valsartan 49-51 MG Commonly known as: ENTRESTO Take 1 tablet by mouth 2 (two) times daily.   senna-docusate 8.6-50 MG tablet Commonly known as: Senokot-S Take 2 tablets by mouth 2 (two) times daily.   silver sulfADIAZINE 1 % cream Commonly known as: SILVADENE Apply 1 Application topically daily as  needed (rash).   triamcinolone cream 0.1 % Commonly known as: KENALOG Apply 1 Application topically 2 (two) times daily as needed (rash).          Contact information for follow-up providers     [facility] Schedule an appointment as soon as possible for a visit.   Why: post hospitalization follow up Contact information: [address] [name] [place] [address] [phone]              Contact information for after-discharge care     Destination     [facility] .   Service: Skilled Nursing Contact information: [address] [name] [address] [phone]                     TOTAL DISCHARGE TIME: 35 minutes  Braden Deloach Rito Ehrlich  [facility] Pager on [email or web address]  04/13/2023, 9:12 AM

## 2023-04-13 NOTE — TOC Progression Note (Signed)
Transition of Care [facility]) - Progression Note    Patient Details  Name: Paul [name] MRN: [id number] Date of Birth: September 21, 1946  Transition of Care [facility]) CM/SW Contact  Otelia Santee, LCSW Phone Number: 04/13/2023, 10:57 AM  Clinical Narrative:    Spoke with Ursula Alert at [facility] who shares that they will note have male bed until tomorrow 4/16.   Expected Discharge Plan: Skilled Nursing Facility Barriers to Discharge: Continued Medical Work up  Expected Discharge Plan and Services In-house Referral: NA Discharge Planning Services: CM Consult   Living arrangements for the past 2 months: Apartment Expected Discharge Date: 04/13/23               DME Arranged: Dan Humphreys rolling                     Social Determinants of Health (SDOH) Interventions SDOH Screenings   Food Insecurity: No Food Insecurity (04/08/2023)  Housing: Low Risk  (04/08/2023)  Transportation Needs: No Transportation Needs (04/08/2023)  Utilities: Not At Risk (04/08/2023)  Financial Resource Strain: Low Risk  (11/22/2021)  Social Connections: Moderately Isolated (11/22/2021)  Tobacco Use: High Risk (04/07/2023)    Readmission Risk Interventions    04/08/2023    4:58 PM  Readmission Risk Prevention Plan  Transportation Screening Complete  PCP or Specialist Appt within 5-7 Days Complete  Home Care Screening Complete  Medication Review (RN CM) Complete

## 2023-04-14 ENCOUNTER — Encounter: Payer: Self-pay | Admitting: Internal Medicine

## 2023-04-14 LAB — GLUCOSE, CAPILLARY: Glucose-Capillary: 159 mg/dL — ABNORMAL HIGH (ref 70–99)

## 2023-04-14 MED ORDER — IPRATROPIUM-ALBUTEROL 0.5-2.5 (3) MG/3ML IN SOLN
3.0000 mL | Freq: Two times a day (BID) | RESPIRATORY_TRACT | Status: DC
  Administered 2023-04-14: 3 mL via RESPIRATORY_TRACT
  Filled 2023-04-14: qty 3

## 2023-04-14 NOTE — TOC Transition Note (Signed)
Transition of Care [facility]) - CM/SW Discharge Note   Patient Details  Name: Paul [name] MRN: [id number] Date of Birth: [birth year]  [facility]) CM/SW Contact:  Otelia Santee, LCSW Phone Number: 04/14/2023, 10:40 AM   Clinical Narrative:    Pt is to transfer to [facility] in [place] for ST SNF placement. Pt will be going to room 313b. RN to call report to [phone]. Spoke with pt and pt's daughter who are both agreeable to discharge plans. Pt has no family in this area that are able to transport him to SNF. [facility] called at 10:35am to schedule transportation. DC packet at [employer] station.    Final next level of care: Skilled Nursing Facility Barriers to Discharge: Barriers Resolved   Patient Goals and CMS Choice CMS Medicare.gov Compare Post Acute Care list provided to:: Patient Choice offered to / list presented to : Patient  Discharge Placement     Existing PASRR number confirmed : 04/10/23          Patient chooses bed at: [facility] Patient to be transferred to facility by: [facility] Name of family member notified: Ralph Leyden Patient and family notified of of transfer: 04/14/23  Discharge Plan and Services Additional resources added to the After Visit Summary for   In-house Referral: NA Discharge Planning Services: CM Consult            DME Arranged: N/A DME Agency: NA                  Social Determinants of Health (SDOH) Interventions SDOH Screenings   Food Insecurity: No Food Insecurity (04/08/2023)  Housing: Low Risk  (04/08/2023)  Transportation Needs: No Transportation Needs (04/08/2023)  Utilities: Not At Risk (04/08/2023)  Financial Resource Strain: Low Risk  (11/22/2021)  Social Connections: Moderately Isolated (11/22/2021)  Tobacco Use: High Risk (04/07/2023)     Readmission Risk Interventions    04/08/2023    4:58 PM  Readmission Risk Prevention Plan  Transportation Screening Complete  PCP or Specialist Appt  within 5-7 Days Complete  Home Care Screening Complete  Medication Review (RN CM) Complete

## 2023-04-16 ENCOUNTER — Ambulatory Visit (HOSPITAL_COMMUNITY): Payer: No Typology Code available for payment source

## 2023-04-16 ENCOUNTER — Other Ambulatory Visit: Payer: No Typology Code available for payment source

## 2023-04-22 ENCOUNTER — Inpatient Hospital Stay (HOSPITAL_BASED_OUTPATIENT_CLINIC_OR_DEPARTMENT_OTHER): Payer: Non-veteran care | Admitting: Physician Assistant

## 2023-04-22 DIAGNOSIS — C3491 Malignant neoplasm of unspecified part of right bronchus or lung: Secondary | ICD-10-CM | POA: Diagnosis not present

## 2023-04-22 NOTE — Progress Notes (Signed)
[facility] HEMATOLOGY-ONCOLOGY TeleHEALTH VISIT PROGRESS NOTE   I connected with Esley Brooking on 04/22/23 at  3:30 PM EDT by telephone and verified that I am speaking with the correct person using two identifiers.  I discussed the limitations, risks, security and privacy concerns of performing an evaluation and management service by telemedicine and the availability of in-person appointments. I also discussed with the patient that there may be a patient responsible charge related to this service. The patient expressed understanding and agreed to proceed.  Other persons participating in the visit and their role in the encounter: None  Patient's location: SNF  Provider's location: Office  Clinic, [place] [address] [facility] [address] [place] [address]  DIAGNOSIS:  Stage IIIB (T3b, N2, M0) non-small cell lung cancer, adenocarcinoma presented with large right lower lobe lung mass and suspicious right lower paratracheal lymphadenopathy diagnosed in June 2022.   Molecular studies by [employer] showed no actionable mutations  PRIOR THERAPY: 1) Neoadjuvant systemic chemotherapy according to the Checkmate 816 with carboplatin for AUC of 5, Alimta 500 Mg/M2 and nivolumab 360 Mg IV every 3 weeks for 3 cycles.  Last dose 09/24/21.  Status post 3 cycles 2) Concurrent chemoradiation with carboplatin for an AUC of 2 and paclitaxel 45 mg per metered square.  First dose expected on 11/04/2021.  Status post 7 cycles. 3) Consolidation treatment with immunotherapy with Imfinzi 1500 Mg IV every 4 weeks.  Last dose on 03/12/23.  Status post 13 cycles.   CURRENT THERAPY: Observation   INTERVAL HISTORY: Cale Bethard 77 y.o. male and I connected via telephone encounter today.  The patient was hospitalized in February for CHF exacerbation and COPD/pneumonia.  He was hospitalized in March for COPD exacerbation.  He was hospitalized in April for hyperglycemia.  He is currently in a  skilled nursing facility and he is expected to be discharged on Friday of this week.  Since he is in a skilled nursing facility, his appointment today was changed to a telephone encounter.  While he was admitted to the hospital in March, he had a CT angio performed which did not show any disease progression.  When he was last seen in the clinic, he completed his last cycle of consolidation immunotherapy which she tolerated well.  Today he denies any fever, chills, or night sweats.  He lost few pounds since last being seen.  He is not fond of the food at the skilled nursing facility.  He reports his breathing/dyspnea on exertion is better since being discharged from the hospital.  He denies any significant cough and states he may cough "now and then".  Denies any chest pain or hemoptysis.  Denies any nausea, vomiting, diarrhea, or constipation.  Denies any headache or visual changes.  The purpose of his visit today is to review his scan results. MEDICAL HISTORY: Past Medical History:  Diagnosis Date   CHF (congestive heart failure)    Coronary artery disease    Diabetes mellitus without complication    History of radiation therapy    Right lung- 11/27/21-01/10/22- Dr. Antony Blackbird   Hypertension    Hypothyroidism    Ischemic cardiomyopathy    Pneumonia    a long time ago   Sleep apnea    no longer uses a cpap    ALLERGIES:  has No Known Allergies.  MEDICATIONS:  Current Outpatient Medications  Medication Sig Dispense Refill   albuterol (VENTOLIN HFA) 108 (90 Base) MCG/ACT inhaler Inhale 2 puffs into the lungs  every 6 (six) hours as needed for wheezing or shortness of breath. 8 g 2   aspirin 81 MG chewable tablet Take 81 mg by mouth daily.     atorvastatin (LIPITOR) 40 MG tablet Take 20 mg by mouth at bedtime.     carvedilol (COREG) 25 MG tablet Take 25 mg by mouth 2 (two) times daily with a meal.     clobetasol cream (TEMOVATE) 0.05 % Apply 1 Application topically 2 (two) times daily as  needed (back pain).     empagliflozin (JARDIANCE) 10 MG TABS tablet Take 1 tablet by mouth daily.     fluticasone (FLONASE) 50 MCG/ACT nasal spray Place 2 sprays into both nostrils daily.  2   fluticasone-salmeterol (ADVAIR HFA) 230-21 MCG/ACT inhaler Inhale 2 puffs into the lungs 2 (two) times daily. 1 each 12   guaiFENesin (MUCINEX) 600 MG 12 hr tablet Take 1 tablet (600 mg total) by mouth 2 (two) times daily.     ipratropium-albuterol (DUONEB) 0.5-2.5 (3) MG/3ML SOLN Take 3 mLs by nebulization every 4 (four) hours as needed. 360 mL    levothyroxine (SYNTHROID) 125 MCG tablet Take 1 tablet (125 mcg total) by mouth daily before breakfast. 30 tablet 2   lidocaine-prilocaine (EMLA) cream Apply 1 application topically as needed. (Patient taking differently: Apply 1 application  topically daily as needed (back pain).) 30 g 2   Menthol-Methyl Salicylate (MUSCLE RUB) 10-15 % CREA Apply 1 application topically daily as needed for muscle pain.     metFORMIN (GLUCOPHAGE) 500 MG tablet Take 1 tablet (500 mg total) by mouth 2 (two) times daily with a meal. 60 tablet 3   Multiple Vitamins-Minerals ([employer] DAILY MULTIPLE FOR MEN) TABS Take 1 tablet by mouth daily with breakfast.     nitroGLYCERIN (NITROSTAT) 0.4 MG SL tablet Place 0.4 mg under the tongue every 5 (five) minutes as needed for chest pain.     polyethylene glycol (MIRALAX / GLYCOLAX) 17 g packet Take 17 g by mouth daily. 14 each 0   prochlorperazine (COMPAZINE) 10 MG tablet Take 1 tablet (10 mg total) by mouth every 6 (six) hours as needed for nausea or vomiting. 30 tablet 0   sacubitril-valsartan (ENTRESTO) 49-51 MG Take 1 tablet by mouth 2 (two) times daily.     senna-docusate (SENOKOT-S) 8.6-50 MG tablet Take 2 tablets by mouth 2 (two) times daily.     silver sulfADIAZINE (SILVADENE) 1 % cream Apply 1 Application topically daily as needed (rash).     triamcinolone cream (KENALOG) 0.1 % Apply 1 Application topically 2 (two) times daily as needed  (rash).     No current facility-administered medications for this visit.    SURGICAL HISTORY:  Past Surgical History:  Procedure Laterality Date   BRONCHIAL BIOPSY  06/04/2021   Procedure: BRONCHIAL BIOPSIES;  Surgeon: Josephine Igo, DO;  Location: [facility];  Service: Pulmonary;;   BRONCHIAL BRUSHINGS  06/04/2021   Procedure: BRONCHIAL BRUSHINGS;  Surgeon: Josephine Igo, DO;  Location: [facility];  Service: Pulmonary;;   BRONCHIAL NEEDLE ASPIRATION BIOPSY  06/04/2021   Procedure: BRONCHIAL NEEDLE ASPIRATION BIOPSIES;  Surgeon: Josephine Igo, DO;  Location: [facility];  Service: Pulmonary;;   BRONCHIAL WASHINGS  06/04/2021   Procedure: BRONCHIAL WASHINGS;  Surgeon: Josephine Igo, DO;  Location: [facility];  Service: Pulmonary;;   CARDIAC SURGERY     COLONOSCOPY     CORONARY ARTERY BYPASS GRAFT     2019 at [facility]   VIDEO BRONCHOSCOPY WITH  ENDOBRONCHIAL NAVIGATION Right 06/04/2021   Procedure: VIDEO BRONCHOSCOPY WITH ENDOBRONCHIAL NAVIGATION;  Surgeon: Josephine Igo, DO;  Location: [facility];  Service: Pulmonary;  Laterality: Right;   VIDEO BRONCHOSCOPY WITH ENDOBRONCHIAL ULTRASOUND  06/04/2021   Procedure: VIDEO BRONCHOSCOPY WITH ENDOBRONCHIAL ULTRASOUND;  Surgeon: Josephine Igo, DO;  Location: [facility];  Service: Pulmonary;;    REVIEW OF SYSTEMS:   Review of Systems  Constitutional: Positive for baseline fatigue. Positive for appetite change and weight loss. Negative for chills and fever. HENT:   Negative for mouth sores, nosebleeds, sore throat and trouble swallowing.   Eyes: Negative for eye problems and icterus.  Respiratory: Positive for improving dyspnea on exertion compared to last month.  Negative for cough, hemoptysis,  and wheezing.   Cardiovascular: Negative for chest pain.  Positive for bilateral leg swelling.  Denies any calf pain or erythema. Gastrointestinal: Negative for abdominal pain, constipation, diarrhea, nausea and vomiting.   Genitourinary: Negative for bladder incontinence, difficulty urinating, dysuria, frequency and hematuria.   Musculoskeletal: Negative for back pain, gait problem, neck pain and neck stiffness.  Skin: Negative for itching and rash.  Neurological: Negative for dizziness, extremity weakness, gait problem, headaches, light-headedness and seizures.  Hematological: Negative for adenopathy. Does not bruise/bleed easily.  Psychiatric/Behavioral: Negative for confusion, depression and sleep disturbance. The patient is not nervous/anxious.     PHYSICAL EXAMINATION:  There were no vitals taken for this visit.  ECOG PERFORMANCE STATUS: 1  Physical Exam  Constitutional: Oriented to person, place, and time. Psychiatric: Mood, memory and judgment normal.  Vitals reviewed.  LABORATORY DATA: Lab Results  Component Value Date   WBC 4.2 04/11/2023   HGB 10.1 (L) 04/11/2023   HCT 32.7 (L) 04/11/2023   MCV 97.0 04/11/2023   PLT 281 04/11/2023      Chemistry      Component Value Date/Time   NA 139 04/11/2023 0543   K 3.8 04/11/2023 0543   CL 107 04/11/2023 0543   CO2 24 04/11/2023 0543   BUN 18 04/11/2023 0543   CREATININE 1.34 (H) 04/11/2023 0543   CREATININE 1.49 (H) 03/12/2023 0930      Component Value Date/Time   CALCIUM 8.4 (L) 04/11/2023 0543   ALKPHOS 107 03/18/2023 1413   AST 25 03/18/2023 1413   AST 15 03/12/2023 0930   ALT 16 03/18/2023 1413   ALT 8 03/12/2023 0930   BILITOT 0.3 03/18/2023 1413   BILITOT 0.5 03/12/2023 0930       RADIOGRAPHIC STUDIES:  DG CHEST PORT 1 VIEW  Result Date: 04/08/2023 CLINICAL DATA:  Dyspnea EXAM: PORTABLE CHEST 1 VIEW COMPARISON:  April 9, 24. FINDINGS: Similar cardiomegaly. Suspect small bilateral pleural effusions. Mild overlying opacities. No visible pneumothorax. CABG and median sternotomy. IMPRESSION: 1. Suspect small bilateral pleural effusions and mild overlying opacities, most likely atelectasis. 2. Cardiomegaly. Electronically  Signed   By: Feliberto Harts M.D.   On: 04/08/2023 11:49   DG Chest Portable 1 View  Result Date: 04/07/2023 CLINICAL DATA:  Altered mental status. EXAM: PORTABLE CHEST 1 VIEW COMPARISON:  March 16, 2023. FINDINGS: Stable cardiomegaly. Status post coronary bypass graft. Lungs are clear. Bony thorax is unremarkable. IMPRESSION: No active disease. Electronically Signed   By: Lupita Raider M.D.   On: 04/07/2023 15:51     ASSESSMENT/PLAN:  This is a very pleasant 77 year old African-American male diagnosed with stage IIIb (T3b, N2, M0) non-small cell lung cancer, adenocarcinoma.  He presented with a large right upper lobe lung mass and  suspicious right lower lobe paratracheal lymphadenopathy.  He was diagnosed in June 2022.  His molecular studies by guardant 360 were negative for any actionable mutations.    The patient completed 3 cycles of neoadjuvant chemotherapy according to the Checkmate 816 trial with carboplatin for an AUC of 5, Alimta 500 mg per metered square, and nivolumab 360 mg IV every 3 weeks.  The patient status post 3 cycles.  His last dose was on 09/24/2021.  Upon reimaging after completion of this course, there was not significant improvement in the size of his mass and therefore, it does not appear that we are able to downstage his disease to make him eligible for surgical resection.    He completed a course of concurrent chemoradiation with weekly carboplatin for AUC of 2 and paclitaxel 45 Mg/M2 status post 7 cycles.  Last cycle was given January 02, 2022.     The patient recently completed consolidation treatment with immunotherapy with Imfinzi 1500 Mg IV every 4 weeks status post 13 cycles. His last does of treatment was on 03/12/23.   The patient recently had a restaging CT scan performed. Dr. Arbutus Ped personally and independently reviewed the scan and discussed the results with the patient. The scan showed no evidence of disease progression.   Dr. Arbutus Ped recommends that the  patient continue on observation with a repeat CT scan in 3 months.   We will see him back for follow-up visit a few days after his CT scan to review the results.  I discussed the assessment and treatment plan with the patient. The patient was provided an opportunity to ask questions and all were answered. The patient agreed with the plan and demonstrated an understanding of the instructions.  The patient was advised to call back or seek an in-person evaluation if the symptoms worsen or if the condition fails to improve as anticipated.  I provided 22 minutes of non face-to-face telephone visit time during this encounter, and > 50% was spent counseling as documented under my assessment & plan.  Cabot Cromartie L Holman Bonsignore, PA-C 04/22/2023 4:05 PM  No orders of the defined types were placed in this encounter.    Jasmon Graffam L Winn Muehl, PA-C 04/22/23   ADDENDUM: Hematology/Oncology Attending:  I contributed to the phone visit today with the patient.  I reviewed his record, lab, scan and recommended his care plan.  This is a very pleasant 77 years old African-American male with stage IIIb non-small cell lung cancer, adenocarcinoma diagnosed in June 2022 with no actionable mutations status post neoadjuvant systemic chemotherapy with carboplatin, Alimta and nivolumab for 3 cycles but he has no significant improvement of his disease.  He proceeded with a course of concurrent chemoradiation with weekly carboplatin and paclitaxel for 7 cycles with partial response.  This was followed by consolidation treatment with immunotherapy with Imfinzi every 4 weeks status post 13 cycles.  The patient has been tolerating his treatment well except for fatigue. He had repeat CT scan of the chest performed recently.  I personally and independently reviewed the scan and discussed the result with the patient today. His scan showed no concerning findings for disease progression. I recommended for him to continue on  observation with repeat CT scan of the chest in 3 months. The patient will continue his routine follow-up visit by his primary care physician and pulmonologist for his COPD and other medical issues. He was advised to call immediately if he has any other concerning symptoms in the interval. Disclaimer: This note was dictated  with voice recognition software. Similar sounding words can inadvertently be transcribed and may be missed upon review. Lajuana Matte, MD

## 2023-04-24 ENCOUNTER — Other Ambulatory Visit: Payer: Self-pay

## 2023-07-08 IMAGING — CT CT CHEST W/ CM
2 of 4 series · 14 of 36 positions shown, 17 images · IV contrast (OMNIPAQUE)
Comparison: Chest CT 10/21/2021.

CLINICAL DATA: 75-year-old male with history of non-small cell lung
cancer (adenocarcinoma) with history of right lower lobe lung mass
and right paratracheal lymphadenopathy diagnosed in May 2021.
Follow-up study.

EXAM:
CT CHEST WITH CONTRAST
TECHNIQUE: Multidetector CT imaging of the chest was performed during
intravenous contrast administration.

[Series 2: axial st · axial · 0.88mm/px · z∈[-408,-132]mm · 11 of 162 slices shown, 14 images]
[im 12/162  mediastinal]
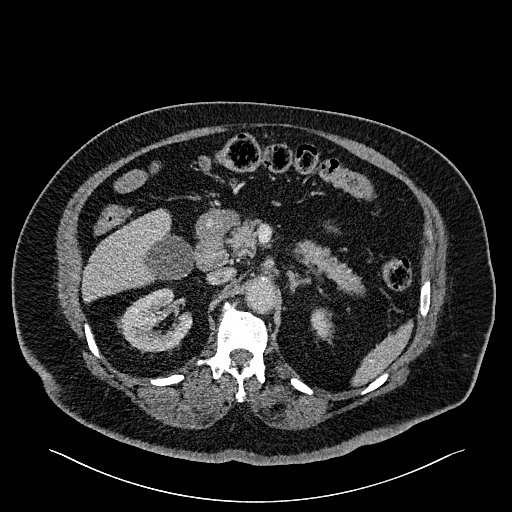
[im 12/162  lung]
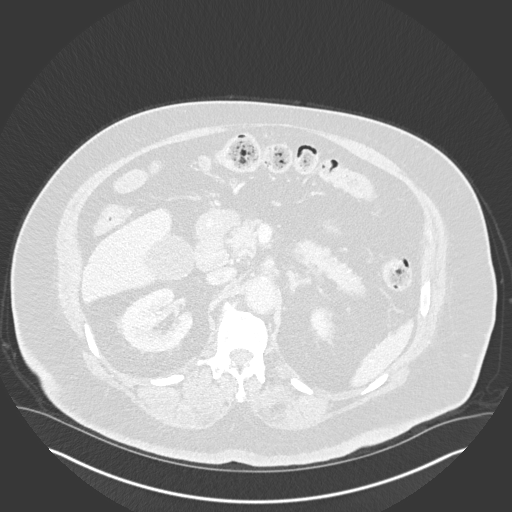
[im 24/162  lung]
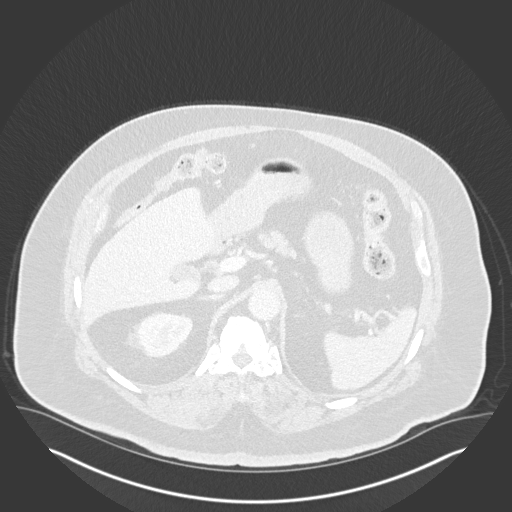
[im 35/162  lung]
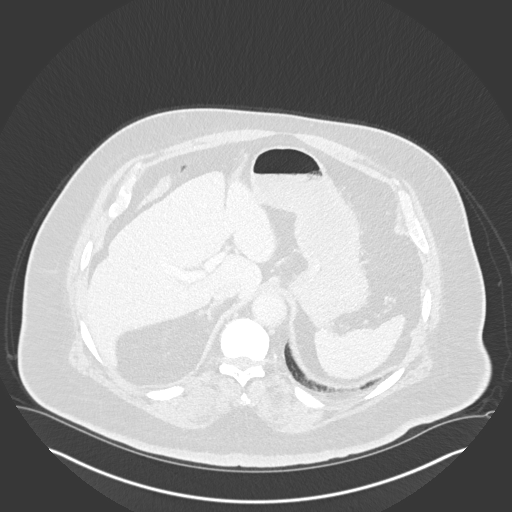
[im 58/162  lung]
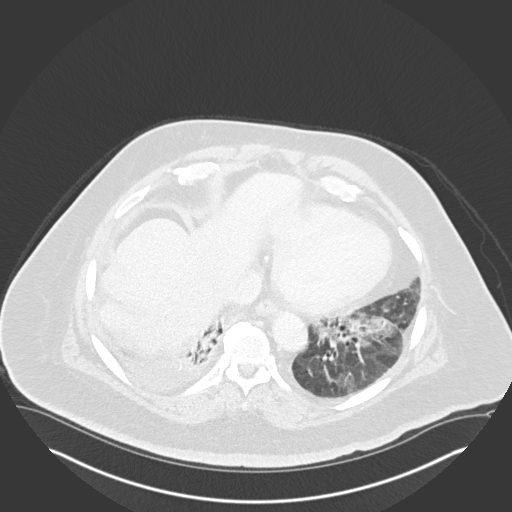
[im 70/162  mediastinal]
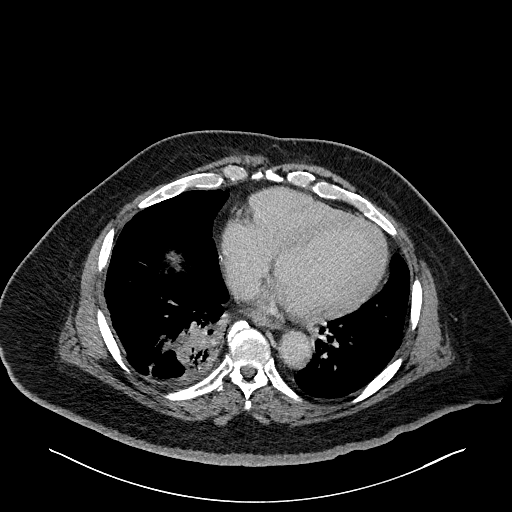
[im 70/162  lung]
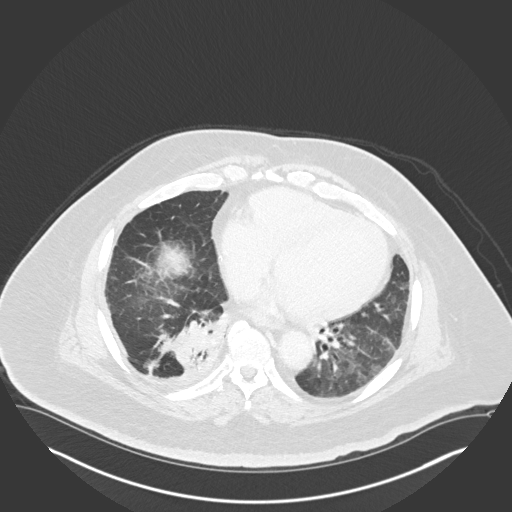
[im 81/162  lung]
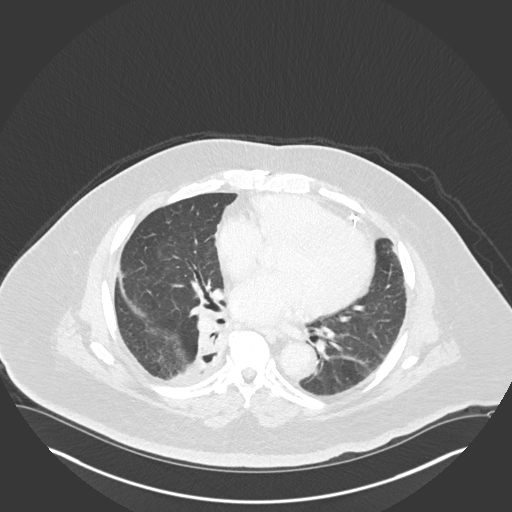
[im 93/162  lung]
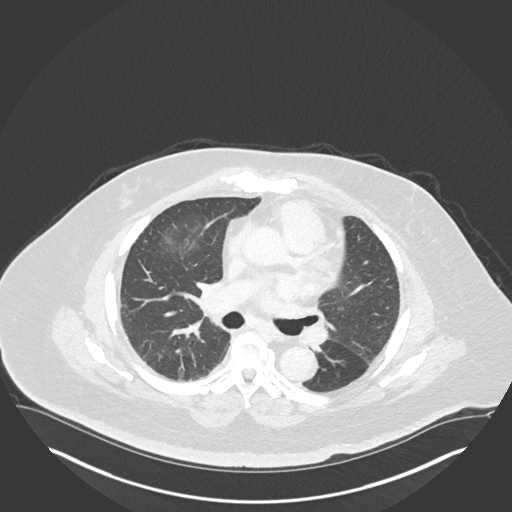
[im 104/162  lung]
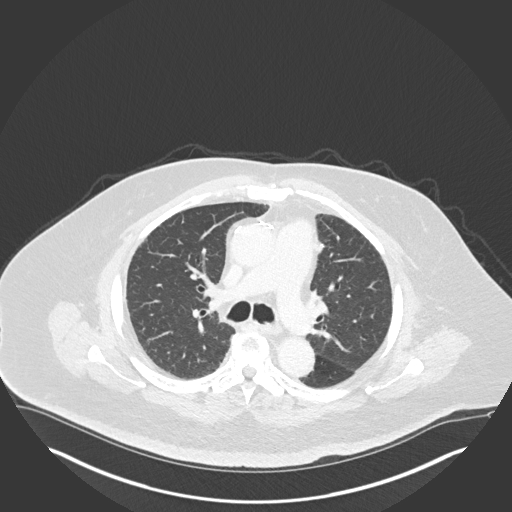
[im 127/162  mediastinal]
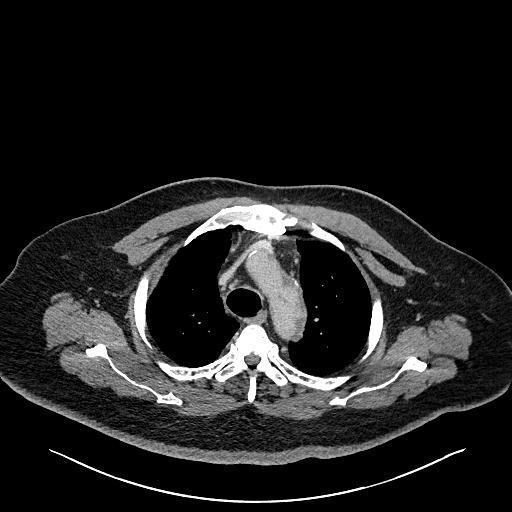
[im 127/162  lung]
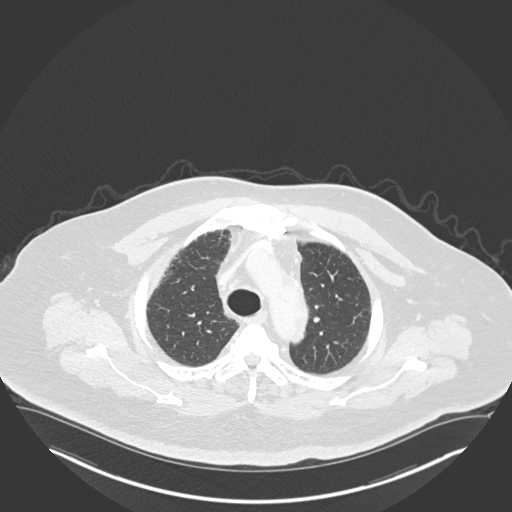
[im 139/162  lung]
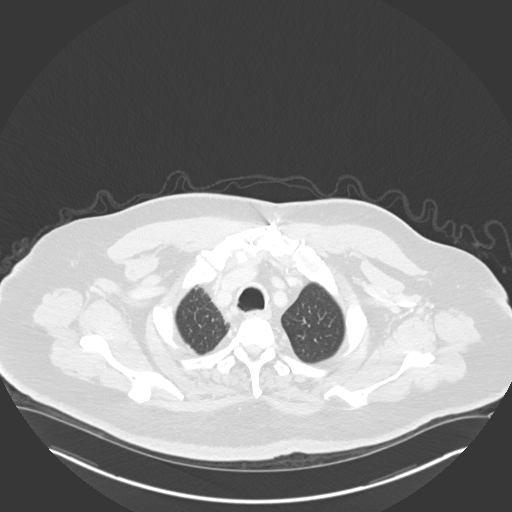
[im 150/162  lung]
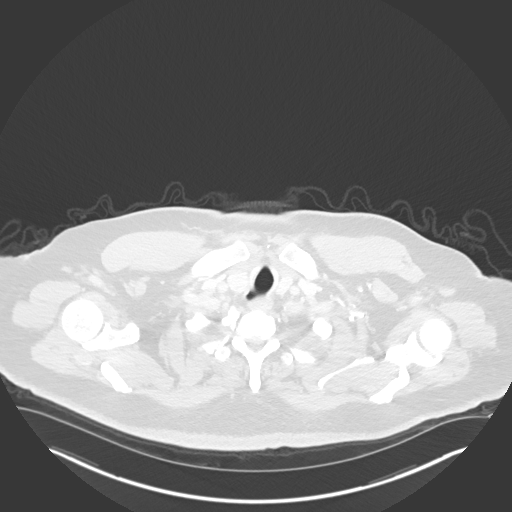

[Series 5: coronal · coronal · 0.75mm/px · 3 of 142 slices shown]
[im 29/142  lung]
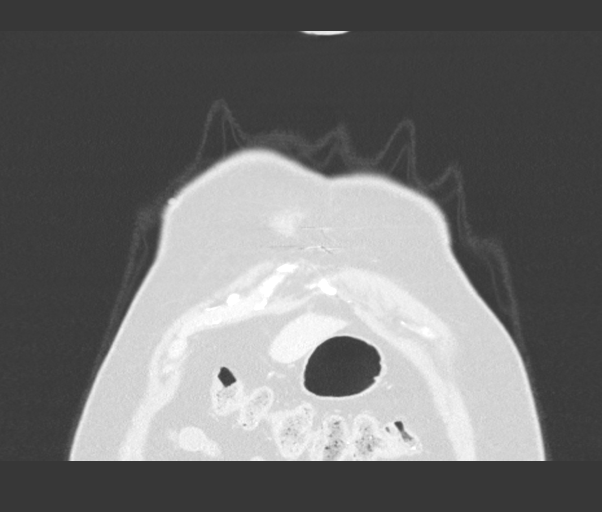
[im 57/142  lung]
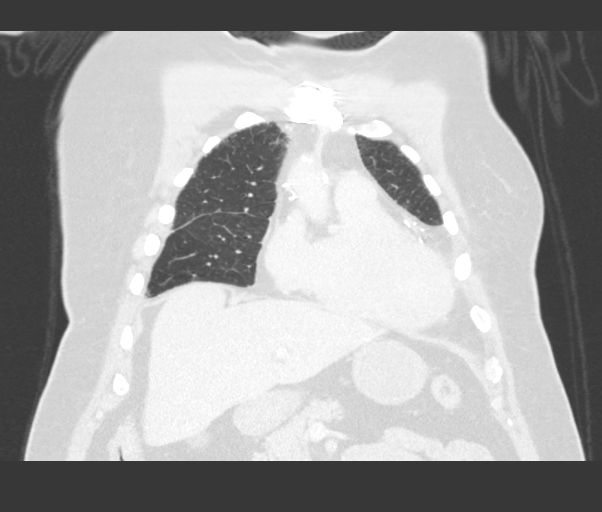
[im 85/142  lung]
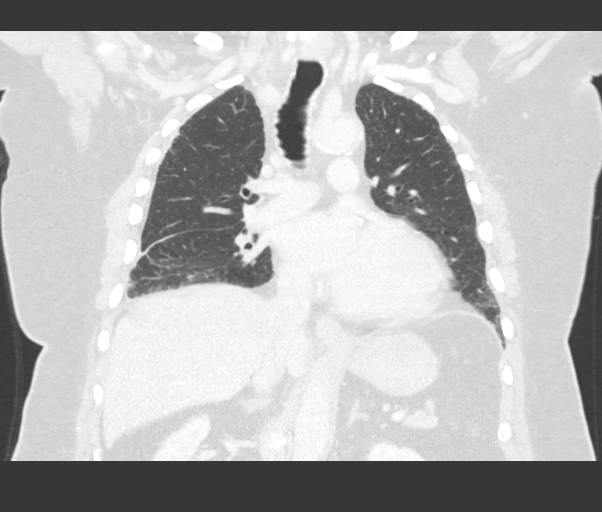

[14 of 36 positions shown; findings below may reference images not displayed]

RADIATION DOSE REDUCTION: This exam was performed according to the
departmental dose-optimization program which includes automated
exposure control, adjustment of the mA and/or kV according to
patient size and/or use of iterative reconstruction technique.

CONTRAST:  75mL OMNIPAQUE IOHEXOL 300 MG/ML  SOLN
FINDINGS: Cardiovascular: Heart size is normal. There is no significant
pericardial fluid, thickening or pericardial calcification. There is
aortic atherosclerosis, as well as atherosclerosis of the great
vessels of the mediastinum and the coronary arteries, including
calcified atherosclerotic plaque in the left main, left anterior
descending, left circumflex and right coronary arteries. Status post
median sternotomy for CABG including [REDACTED] to the LAD. Calcifications
of the aortic valve.

Mediastinum/Nodes: No pathologically enlarged mediastinal or hilar
lymph nodes. Esophagus is unremarkable in appearance. No axillary
lymphadenopathy.

Lungs/Pleura: Previously noted right lower lobe mass is similar to
prior examinations (axial image 88 of series 7) measuring 5.5 x
cm on today's examination. Adjacent to this in the posterior aspect
of the right upper lobe (axial image 84 of series 7) there is an
aggressive appearing 3.3 x 1.2 cm mass-like area of architectural
distortion which has progressively become more apparent over prior
examinations, concerning for slow-growing neoplasm such as an
adenocarcinoma. In addition, there is a persistent mass-like area in
the left lower lobe (axial image 104 of series 7) measuring 5.4 x
3.5 cm, previously hypermetabolic on prior PET-CT 05/31/2021,
concerning for neoplasm. Small right pleural effusion again noted.
No left pleural effusion. Mild diffuse bronchial wall thickening
with very mild centrilobular and paraseptal emphysema.

Upper Abdomen: Aortic atherosclerosis.

Musculoskeletal: Median sternotomy wires. There are no aggressive
appearing lytic or blastic lesions noted in the visualized portions
of the skeleton.
IMPRESSION: 1. Multiple mass-like areas in the lungs bilaterally. The right
lower lobe lesion is very similar to prior examinations, while the
right upper and left lower lobe lesions both appear slightly more
prominent than prior examinations, as detailed above.
2. Aortic atherosclerosis, in addition to left main and three-vessel
coronary artery disease. Status post median sternotomy for CABG
including [REDACTED] to the LAD.
3. Mild diffuse bronchial wall thickening with very mild
centrilobular and paraseptal emphysema; imaging findings suggestive
of underlying COPD.

Aortic Atherosclerosis (AAPPG-IJA.A) and Emphysema ([id number].M).

## 2023-07-13 ENCOUNTER — Inpatient Hospital Stay: Payer: No Typology Code available for payment source | Attending: Internal Medicine

## 2023-07-13 ENCOUNTER — Encounter (HOSPITAL_COMMUNITY): Payer: Self-pay

## 2023-07-13 ENCOUNTER — Ambulatory Visit (HOSPITAL_COMMUNITY): Payer: No Typology Code available for payment source

## 2023-07-13 DIAGNOSIS — C3431 Malignant neoplasm of lower lobe, right bronchus or lung: Secondary | ICD-10-CM | POA: Insufficient documentation

## 2023-07-13 DIAGNOSIS — J90 Pleural effusion, not elsewhere classified: Secondary | ICD-10-CM | POA: Insufficient documentation

## 2023-07-13 DIAGNOSIS — I700 Atherosclerosis of aorta: Secondary | ICD-10-CM | POA: Insufficient documentation

## 2023-07-14 ENCOUNTER — Other Ambulatory Visit: Payer: Self-pay

## 2023-07-14 ENCOUNTER — Ambulatory Visit (HOSPITAL_COMMUNITY)
Admission: RE | Admit: 2023-07-14 | Discharge: 2023-07-14 | Discharge status: Home / Self Care | Payer: No Typology Code available for payment source | Source: Ambulatory Visit | Attending: Physician Assistant

## 2023-07-14 ENCOUNTER — Inpatient Hospital Stay: Payer: No Typology Code available for payment source

## 2023-07-14 DIAGNOSIS — I700 Atherosclerosis of aorta: Secondary | ICD-10-CM | POA: Diagnosis not present

## 2023-07-14 DIAGNOSIS — C3431 Malignant neoplasm of lower lobe, right bronchus or lung: Secondary | ICD-10-CM | POA: Diagnosis not present

## 2023-07-14 DIAGNOSIS — C3491 Malignant neoplasm of unspecified part of right bronchus or lung: Secondary | ICD-10-CM

## 2023-07-14 DIAGNOSIS — J90 Pleural effusion, not elsewhere classified: Secondary | ICD-10-CM | POA: Diagnosis not present

## 2023-07-14 LAB — CMP (CANCER CENTER ONLY)
ALT: 7 U/L (ref 0–44)
AST: 13 U/L — ABNORMAL LOW (ref 15–41)
Albumin: 4 g/dL (ref 3.5–5.0)
Alkaline Phosphatase: 125 U/L (ref 38–126)
Anion gap: 6 (ref 5–15)
BUN: 16 mg/dL (ref 8–23)
CO2: 26 mmol/L (ref 22–32)
Calcium: 9.5 mg/dL (ref 8.9–10.3)
Chloride: 112 mmol/L — ABNORMAL HIGH (ref 98–111)
Creatinine: 1.54 mg/dL — ABNORMAL HIGH (ref 0.61–1.24)
GFR, Estimated: 46 mL/min — ABNORMAL LOW (ref >60)
Glucose, Bld: 111 mg/dL — ABNORMAL HIGH (ref 70–99)
Potassium: 4.1 mmol/L (ref 3.5–5.1)
Sodium: 144 mmol/L (ref 135–145)
Total Bilirubin: 0.4 mg/dL (ref 0.3–1.2)
Total Protein: 7.7 g/dL (ref 6.5–8.1)

## 2023-07-14 LAB — CBC WITH DIFFERENTIAL (CANCER CENTER ONLY)
Abs Immature Granulocytes: 0.02 10*3/uL (ref 0.00–0.07)
Basophils Absolute: 0.1 10*3/uL (ref 0.0–0.1)
Basophils Relative: 1 %
Eosinophils Absolute: 0.2 10*3/uL (ref 0.0–0.5)
Eosinophils Relative: 3 %
HCT: 41.6 % (ref 39.0–52.0)
Hemoglobin: 13.1 g/dL (ref 13.0–17.0)
Immature Granulocytes: 0 %
Lymphocytes Relative: 19 %
Lymphs Abs: 1.3 10*3/uL (ref 0.7–4.0)
MCH: 29.6 pg (ref 26.0–34.0)
MCHC: 31.5 g/dL (ref 30.0–36.0)
MCV: 94.1 fL (ref 80.0–100.0)
Monocytes Absolute: 0.7 10*3/uL (ref 0.1–1.0)
Monocytes Relative: 10 %
Neutro Abs: 4.7 10*3/uL (ref 1.7–7.7)
Neutrophils Relative %: 67 %
Platelet Count: 302 10*3/uL (ref 150–400)
RBC: 4.42 MIL/uL (ref 4.22–5.81)
RDW: 15.5 % (ref 11.5–15.5)
WBC Count: 7 10*3/uL (ref 4.0–10.5)
nRBC: 0 % (ref 0.0–0.2)

## 2023-07-16 ENCOUNTER — Inpatient Hospital Stay: Payer: No Typology Code available for payment source | Admitting: Internal Medicine

## 2023-07-22 ENCOUNTER — Telehealth: Payer: Self-pay | Admitting: Internal Medicine

## 2023-07-22 NOTE — Telephone Encounter (Signed)
Called patient regarding July appointments, patient is notified. 

## 2023-07-23 ENCOUNTER — Other Ambulatory Visit: Payer: Self-pay

## 2023-07-27 ENCOUNTER — Inpatient Hospital Stay: Payer: Non-veteran care | Admitting: Internal Medicine

## 2023-07-30 ENCOUNTER — Other Ambulatory Visit: Payer: Self-pay

## 2023-08-03 ENCOUNTER — Inpatient Hospital Stay: Payer: No Typology Code available for payment source | Attending: Internal Medicine | Admitting: Internal Medicine

## 2023-08-03 DIAGNOSIS — R0602 Shortness of breath: Secondary | ICD-10-CM | POA: Insufficient documentation

## 2023-08-03 DIAGNOSIS — M7989 Other specified soft tissue disorders: Secondary | ICD-10-CM | POA: Insufficient documentation

## 2023-08-03 DIAGNOSIS — Z923 Personal history of irradiation: Secondary | ICD-10-CM | POA: Insufficient documentation

## 2023-08-03 DIAGNOSIS — C3431 Malignant neoplasm of lower lobe, right bronchus or lung: Secondary | ICD-10-CM | POA: Insufficient documentation

## 2023-08-03 DIAGNOSIS — Z9221 Personal history of antineoplastic chemotherapy: Secondary | ICD-10-CM | POA: Insufficient documentation

## 2023-08-07 ENCOUNTER — Telehealth: Payer: Self-pay | Admitting: Internal Medicine

## 2023-08-07 ENCOUNTER — Inpatient Hospital Stay (HOSPITAL_BASED_OUTPATIENT_CLINIC_OR_DEPARTMENT_OTHER): Payer: No Typology Code available for payment source | Admitting: Internal Medicine

## 2023-08-07 VITALS — BP 102/76 | HR 83 | Temp 97.6°F | Resp 16 | Wt 224.8 lb

## 2023-08-07 DIAGNOSIS — C3431 Malignant neoplasm of lower lobe, right bronchus or lung: Secondary | ICD-10-CM | POA: Diagnosis present

## 2023-08-07 DIAGNOSIS — M7989 Other specified soft tissue disorders: Secondary | ICD-10-CM | POA: Diagnosis not present

## 2023-08-07 DIAGNOSIS — C3490 Malignant neoplasm of unspecified part of unspecified bronchus or lung: Secondary | ICD-10-CM

## 2023-08-07 DIAGNOSIS — Z923 Personal history of irradiation: Secondary | ICD-10-CM | POA: Diagnosis not present

## 2023-08-07 DIAGNOSIS — R0602 Shortness of breath: Secondary | ICD-10-CM | POA: Diagnosis not present

## 2023-08-07 DIAGNOSIS — Z9221 Personal history of antineoplastic chemotherapy: Secondary | ICD-10-CM | POA: Diagnosis not present

## 2023-08-07 NOTE — Progress Notes (Signed)
[facility] [phone]   [phone]  OFFICE PROGRESS NOTE  Clinic, [name] [address] [facility] [place] [address]  DIAGNOSIS:  Stage IIIB (T3b, N2, M0) non-small cell lung cancer, adenocarcinoma presented with large right lower lobe lung mass and suspicious right lower paratracheal lymphadenopathy diagnosed in June 2022.   Molecular studies by [employer] showed no actionable mutations   PRIOR THERAPY: 1) Neoadjuvant systemic chemotherapy according to the Checkmate 816 with carboplatin for AUC of 5, Alimta 500 Mg/M2 and nivolumab 360 Mg IV every 3 weeks for 3 cycles.  Last dose 09/24/21.  Status post 3 cycles 2) Concurrent chemoradiation with carboplatin for an AUC of 2 and paclitaxel 45 mg per metered square.  First dose expected on 11/04/2021.  Status post 7 cycles. 3) Consolidation treatment with immunotherapy with Imfinzi 1500 Mg IV every 4 weeks.  Last dose on 03/12/23.  Status post 13 cycles.    CURRENT THERAPY: Observation   INTERVAL HISTORY: Paul [name] 77 y.o. male returns to the clinic today for follow-up visit.  The patient is feeling fine today with no concerning complaints except for the baseline shortness of breath as well as weakness in the lower extremity with mild swelling secondary to renal insufficiency.  He missed his appointment several times recently.  He presented to the office as a walk-in requesting to be seen.  I was able to see him today.  He has no chest pain but has mild cough with no hemoptysis.  He has no nausea, vomiting, diarrhea or constipation.  He has no headache or visual changes.  He has no recent weight loss or night sweats.  He had CT scan of the chest performed recently and he is here today for evaluation and discussion of his scan results.  MEDICAL HISTORY: Past Medical History:  Diagnosis Date   CHF (congestive heart failure) (HCC)    Coronary artery disease    Diabetes  mellitus without complication (HCC)    History of radiation therapy    Right lung- 11/27/21-01/10/22- Dr. Antony Blackbird   Hypertension    Hypothyroidism    Ischemic cardiomyopathy    Pneumonia    a long time ago   Sleep apnea    no longer uses a cpap    ALLERGIES:  has No Known Allergies.  MEDICATIONS:  Current Outpatient Medications  Medication Sig Dispense Refill   albuterol (VENTOLIN HFA) 108 (90 Base) MCG/ACT inhaler Inhale 2 puffs into the lungs every 6 (six) hours as needed for wheezing or shortness of breath. 8 g 2   aspirin 81 MG chewable tablet Take 81 mg by mouth daily.     atorvastatin (LIPITOR) 40 MG tablet Take 20 mg by mouth at bedtime.     carvedilol (COREG) 25 MG tablet Take 25 mg by mouth 2 (two) times daily with a meal.     clobetasol cream (TEMOVATE) 0.05 % Apply 1 Application topically 2 (two) times daily as needed (back pain).     empagliflozin (JARDIANCE) 10 MG TABS tablet Take 1 tablet by mouth daily.     fluticasone (FLONASE) 50 MCG/ACT nasal spray Place 2 sprays into both nostrils daily.  2   fluticasone-salmeterol (ADVAIR HFA) 230-21 MCG/ACT inhaler Inhale 2 puffs into the lungs 2 (two) times daily. 1 each 12   guaiFENesin (MUCINEX) 600 MG 12 hr tablet Take 1 tablet (600 mg total) by mouth 2 (two) times daily.     ipratropium-albuterol (DUONEB) 0.5-2.5 (  3) MG/3ML SOLN Take 3 mLs by nebulization every 4 (four) hours as needed. 360 mL    levothyroxine (SYNTHROID) 125 MCG tablet Take 1 tablet (125 mcg total) by mouth daily before breakfast. 30 tablet 2   lidocaine-prilocaine (EMLA) cream Apply 1 application topically as needed. (Patient taking differently: Apply 1 application  topically daily as needed (back pain).) 30 g 2   Menthol-Methyl Salicylate (MUSCLE RUB) 10-15 % CREA Apply 1 application topically daily as needed for muscle pain.     metFORMIN (GLUCOPHAGE) 500 MG tablet Take 1 tablet (500 mg total) by mouth 2 (two) times daily with a meal. 60 tablet 3    Multiple Vitamins-Minerals ([employer] DAILY MULTIPLE FOR MEN) TABS Take 1 tablet by mouth daily with breakfast.     nitroGLYCERIN (NITROSTAT) 0.4 MG SL tablet Place 0.4 mg under the tongue every 5 (five) minutes as needed for chest pain.     polyethylene glycol (MIRALAX / GLYCOLAX) 17 g packet Take 17 g by mouth daily. 14 each 0   prochlorperazine (COMPAZINE) 10 MG tablet Take 1 tablet (10 mg total) by mouth every 6 (six) hours as needed for nausea or vomiting. 30 tablet 0   sacubitril-valsartan (ENTRESTO) 49-51 MG Take 1 tablet by mouth 2 (two) times daily.     senna-docusate (SENOKOT-S) 8.6-50 MG tablet Take 2 tablets by mouth 2 (two) times daily.     silver sulfADIAZINE (SILVADENE) 1 % cream Apply 1 Application topically daily as needed (rash).     triamcinolone cream (KENALOG) 0.1 % Apply 1 Application topically 2 (two) times daily as needed (rash).     No current facility-administered medications for this visit.    SURGICAL HISTORY:  Past Surgical History:  Procedure Laterality Date   BRONCHIAL BIOPSY  06/04/2021   Procedure: BRONCHIAL BIOPSIES;  Surgeon: Josephine Igo, DO;  Location: [facility];  Service: Pulmonary;;   BRONCHIAL BRUSHINGS  06/04/2021   Procedure: BRONCHIAL BRUSHINGS;  Surgeon: Josephine Igo, DO;  Location: [facility];  Service: Pulmonary;;   BRONCHIAL NEEDLE ASPIRATION BIOPSY  06/04/2021   Procedure: BRONCHIAL NEEDLE ASPIRATION BIOPSIES;  Surgeon: Josephine Igo, DO;  Location: [facility];  Service: Pulmonary;;   BRONCHIAL WASHINGS  06/04/2021   Procedure: BRONCHIAL WASHINGS;  Surgeon: Josephine Igo, DO;  Location: [facility];  Service: Pulmonary;;   CARDIAC SURGERY     COLONOSCOPY     CORONARY ARTERY BYPASS GRAFT     2019 at [facility]   VIDEO BRONCHOSCOPY WITH ENDOBRONCHIAL NAVIGATION Right 06/04/2021   Procedure: VIDEO BRONCHOSCOPY WITH ENDOBRONCHIAL NAVIGATION;  Surgeon: Josephine Igo, DO;  Location: [facility];  Service: Pulmonary;  Laterality:  Right;   VIDEO BRONCHOSCOPY WITH ENDOBRONCHIAL ULTRASOUND  06/04/2021   Procedure: VIDEO BRONCHOSCOPY WITH ENDOBRONCHIAL ULTRASOUND;  Surgeon: Josephine Igo, DO;  Location: [facility];  Service: Pulmonary;;    REVIEW OF SYSTEMS:  Constitutional: positive for fatigue Eyes: negative Ears, nose, mouth, throat, and face: negative Respiratory: positive for dyspnea on exertion Cardiovascular: negative Gastrointestinal: negative Genitourinary:negative Integument/breast: negative Hematologic/lymphatic: negative Musculoskeletal:negative Neurological: negative Behavioral/Psych: negative Endocrine: negative Allergic/Immunologic: negative   PHYSICAL EXAMINATION: General appearance: alert, cooperative, fatigued, and no distress Head: Normocephalic, without obvious abnormality, atraumatic Neck: no adenopathy, no JVD, supple, symmetrical, trachea midline, and thyroid not enlarged, symmetric, no tenderness/mass/nodules Lymph nodes: Cervical, supraclavicular, and axillary nodes normal. Resp: clear to auscultation bilaterally Back: symmetric, no curvature. ROM normal. No CVA tenderness. Cardio: regular rate and rhythm, S1, S2 normal, no murmur, click, rub  or gallop GI: soft, non-tender; bowel sounds normal; no masses,  no organomegaly Extremities: extremities normal, atraumatic, no cyanosis or edema Neurologic: Alert and oriented X 3, normal strength and tone. Normal symmetric reflexes. Normal coordination and gait  ECOG PERFORMANCE STATUS: 1 - Symptomatic but completely ambulatory  There were no vitals taken for this visit.  LABORATORY DATA: Lab Results  Component Value Date   WBC 7.0 07/14/2023   HGB 13.1 07/14/2023   HCT 41.6 07/14/2023   MCV 94.1 07/14/2023   PLT 302 07/14/2023      Chemistry      Component Value Date/Time   NA 144 07/14/2023 1234   K 4.1 07/14/2023 1234   CL 112 (H) 07/14/2023 1234   CO2 26 07/14/2023 1234   BUN 16 07/14/2023 1234   CREATININE 1.54 (H)  07/14/2023 1234      Component Value Date/Time   CALCIUM 9.5 07/14/2023 1234   ALKPHOS 125 07/14/2023 1234   AST 13 (L) 07/14/2023 1234   ALT 7 07/14/2023 1234   BILITOT 0.4 07/14/2023 1234       RADIOGRAPHIC STUDIES: CT Chest Wo Contrast  Result Date: 07/15/2023 CLINICAL DATA:  Non-small cell lung cancer; * Tracking Code: BO * EXAM: CT CHEST WITHOUT CONTRAST TECHNIQUE: Multidetector CT imaging of the chest was performed following the standard protocol without IV contrast. RADIATION DOSE REDUCTION: This exam was performed according to the departmental dose-optimization program which includes automated exposure control, adjustment of the mA and/or kV according to patient size and/or use of iterative reconstruction technique. COMPARISON:  Multiple priors, most recent chest CTA dated March 16, 2023 and chest CT dated December 25, 2022 FINDINGS: Cardiovascular: Mild cardiomegaly. No pericardial effusion. Mild aortic valve calcifications. Normal caliber thoracic aorta. 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 Mediastinum/Nodes: Small hiatal hernia. Thyroid is unremarkable. Stable prominent right paratracheal lymph node measuring 10 mm on series 2, image 47. Otherwise, no enlarged lymph nodes seen in the chest. Lungs/Pleura: Central airways are patent. Left lower lobe posttreatment changes with area of consolidation along the posterior margin, best appreciated on axial series 5, image 111 and sagittal series 7 image 130, increased when compared with chest CT dated December 25, 2022. Similar to more recent prior chest CTA dated March 16, 2023, however motion artifact somewhat limits evaluation. New small nodule also seen measuring 8 mm on series 5, image 104 in the left lower lobe along the posterior margin. Stable posttreatment changes of the right hemithorax. Stable small loculated right pleural effusion. Upper Abdomen: No acute  abnormality. Musculoskeletal: Prior median sternotomy with intact sternal wires. No aggressive appearing osseous lesions. IMPRESSION: 1. Left lower lobe posttreatment change with area of consolidation along the posterior margin which is increased when compared with chest CT dated December 25, 2022. Similar to more recent prior chest CTA dated March 16, 2023, however motion artifact limits evaluation. Findings are concerning for recurrent disease. Consider PET-CT for further evaluation. 2. Stable posttreatment changes of the right hemithorax. 3. Stable small loculated right pleural effusion. 4.  Aortic Atherosclerosis (ICD10-I70.0). Electronically Signed   By: Allegra Lai M.D.   On: 07/15/2023 16:39    ASSESSMENT AND PLAN: This is a very pleasant 77 years old African-American male with stage IIIb non-small cell lung cancer, adenocarcinoma diagnosed in June 2022 with no actionable mutations status post neoadjuvant systemic chemotherapy with carboplatin, Alimta and nivolumab for 3 cycles but he has no significant improvement of his disease.  He proceeded with a course of concurrent chemoradiation with weekly carboplatin and paclitaxel for 7 cycles with partial response. This was followed by consolidation treatment with immunotherapy with Imfinzi every 4 weeks status post 13 cycles. The patient has been tolerating his treatment well except for fatigue.  The patient is currently on observation and he is feeling fine except for the baseline shortness of breath and swelling of the lower extremities. He had repeat CT scan of the chest performed recently.  I personally and independently reviewed the scan images and discussed the results with the patient today.  His scan showed left lower lobe postradiation changes increased compared to December 2023 and concerning for disease recurrence. I recommended for the patient to have a PET scan for further evaluation of his disease. I will see him back for follow-up visit  in 3 weeks for evaluation and discussion of his PET scan results and further recommendation regarding treatment of his condition. He was advised to call immediately if he has any other concerning symptoms in the interval. The patient voices understanding of current disease status and treatment options and is in agreement with the current care plan.  All questions were answered. The patient knows to call the clinic with any problems, questions or concerns. We can certainly see the patient much sooner if necessary.  The total time spent in the appointment was 30 minutes.  Disclaimer: This note was dictated with voice recognition software. Similar sounding words can inadvertently be transcribed and may not be corrected upon review.

## 2023-08-07 NOTE — Telephone Encounter (Signed)
Scheduled per 08/09 los, patient received updated calendar.

## 2023-08-08 ENCOUNTER — Other Ambulatory Visit: Payer: Self-pay

## 2023-08-13 ENCOUNTER — Ambulatory Visit: Payer: Non-veteran care | Admitting: Internal Medicine

## 2023-08-21 ENCOUNTER — Ambulatory Visit (HOSPITAL_COMMUNITY): Admission: RE | Admit: 2023-08-21 | Payer: No Typology Code available for payment source | Source: Ambulatory Visit

## 2023-08-23 ENCOUNTER — Other Ambulatory Visit: Payer: Self-pay

## 2023-08-27 ENCOUNTER — Inpatient Hospital Stay: Payer: No Typology Code available for payment source | Admitting: Internal Medicine

## 2023-08-28 ENCOUNTER — Other Ambulatory Visit: Payer: Self-pay

## 2023-09-04 ENCOUNTER — Encounter (HOSPITAL_COMMUNITY): Payer: Non-veteran care

## 2023-09-04 ENCOUNTER — Other Ambulatory Visit: Payer: Self-pay

## 2023-09-08 ENCOUNTER — Encounter: Payer: Self-pay | Admitting: Medical Oncology

## 2023-09-08 NOTE — Progress Notes (Signed)
I received a call from Sue Lush, oncology navigator at [facility], requesting progress notes, imaging reports and pathology reports to establish his care at the [place]. Documents faxed to [phone] at 14:55. Successful Fax report received at 15:13. I called Sue Lush at [phone] to confirm she had received the documents. Sue Lush confirmed she had. No further action needed at this time.

## 2023-09-08 NOTE — Progress Notes (Unsigned)
LVm on dtr and pts phone that is appt for tomorrow is cancelled because he did not have the PET scan yet

## 2023-09-09 ENCOUNTER — Inpatient Hospital Stay: Payer: Non-veteran care | Admitting: Internal Medicine

## 2023-09-15 ENCOUNTER — Ambulatory Visit (HOSPITAL_COMMUNITY)
Admission: RE | Admit: 2023-09-15 | Discharge: 2023-09-15 | Disposition: A | Discharge status: Home / Self Care | Payer: No Typology Code available for payment source | Source: Ambulatory Visit | Attending: Internal Medicine | Admitting: Internal Medicine

## 2023-09-15 DIAGNOSIS — C3490 Malignant neoplasm of unspecified part of unspecified bronchus or lung: Secondary | ICD-10-CM | POA: Insufficient documentation

## 2023-09-15 LAB — GLUCOSE, CAPILLARY: Glucose-Capillary: 261 mg/dL — ABNORMAL HIGH (ref 70–99)

## 2023-09-15 MED ORDER — FLUDEOXYGLUCOSE F - 18 (FDG) INJECTION
11.0500 | Freq: Once | INTRAVENOUS | Status: AC
  Administered 2023-09-15: 11.05 via INTRAVENOUS

## 2023-10-06 ENCOUNTER — Other Ambulatory Visit: Payer: Self-pay

## 2023-10-06 ENCOUNTER — Inpatient Hospital Stay (HOSPITAL_COMMUNITY)
Admission: EM | Admit: 2023-10-06 | Discharge: 2023-10-12 | DRG: 637 | Disposition: A | Discharge status: Skilled Nursing Facility | Payer: No Typology Code available for payment source | Attending: Internal Medicine | Admitting: Internal Medicine

## 2023-10-06 ENCOUNTER — Encounter (HOSPITAL_COMMUNITY): Payer: Self-pay | Admitting: Pharmacy Technician

## 2023-10-06 ENCOUNTER — Observation Stay (HOSPITAL_COMMUNITY): Payer: No Typology Code available for payment source

## 2023-10-06 DIAGNOSIS — Z79890 Hormone replacement therapy (postmenopausal): Secondary | ICD-10-CM

## 2023-10-06 DIAGNOSIS — Z7951 Long term (current) use of inhaled steroids: Secondary | ICD-10-CM

## 2023-10-06 DIAGNOSIS — N1831 Chronic kidney disease, stage 3a: Secondary | ICD-10-CM | POA: Diagnosis present

## 2023-10-06 DIAGNOSIS — J449 Chronic obstructive pulmonary disease, unspecified: Secondary | ICD-10-CM | POA: Diagnosis present

## 2023-10-06 DIAGNOSIS — E1100 Type 2 diabetes mellitus with hyperosmolarity without nonketotic hyperglycemic-hyperosmolar coma (NKHHC): Secondary | ICD-10-CM | POA: Diagnosis not present

## 2023-10-06 DIAGNOSIS — E876 Hypokalemia: Secondary | ICD-10-CM | POA: Diagnosis present

## 2023-10-06 DIAGNOSIS — G9341 Metabolic encephalopathy: Secondary | ICD-10-CM | POA: Diagnosis present

## 2023-10-06 DIAGNOSIS — E039 Hypothyroidism, unspecified: Secondary | ICD-10-CM | POA: Diagnosis present

## 2023-10-06 DIAGNOSIS — E861 Hypovolemia: Secondary | ICD-10-CM | POA: Diagnosis present

## 2023-10-06 DIAGNOSIS — Z951 Presence of aortocoronary bypass graft: Secondary | ICD-10-CM

## 2023-10-06 DIAGNOSIS — I10 Essential (primary) hypertension: Secondary | ICD-10-CM | POA: Diagnosis present

## 2023-10-06 DIAGNOSIS — E785 Hyperlipidemia, unspecified: Secondary | ICD-10-CM | POA: Diagnosis present

## 2023-10-06 DIAGNOSIS — E11649 Type 2 diabetes mellitus with hypoglycemia without coma: Secondary | ICD-10-CM | POA: Diagnosis present

## 2023-10-06 DIAGNOSIS — Z6834 Body mass index (BMI) 34.0-34.9, adult: Secondary | ICD-10-CM

## 2023-10-06 DIAGNOSIS — Z79899 Other long term (current) drug therapy: Secondary | ICD-10-CM

## 2023-10-06 DIAGNOSIS — C3491 Malignant neoplasm of unspecified part of right bronchus or lung: Secondary | ICD-10-CM | POA: Diagnosis present

## 2023-10-06 DIAGNOSIS — I130 Hypertensive heart and chronic kidney disease with heart failure and stage 1 through stage 4 chronic kidney disease, or unspecified chronic kidney disease: Secondary | ICD-10-CM | POA: Diagnosis present

## 2023-10-06 DIAGNOSIS — R2689 Other abnormalities of gait and mobility: Secondary | ICD-10-CM | POA: Diagnosis present

## 2023-10-06 DIAGNOSIS — Z923 Personal history of irradiation: Secondary | ICD-10-CM

## 2023-10-06 DIAGNOSIS — F17210 Nicotine dependence, cigarettes, uncomplicated: Secondary | ICD-10-CM | POA: Diagnosis present

## 2023-10-06 DIAGNOSIS — Z7984 Long term (current) use of oral hypoglycemic drugs: Secondary | ICD-10-CM

## 2023-10-06 DIAGNOSIS — I255 Ischemic cardiomyopathy: Secondary | ICD-10-CM | POA: Diagnosis present

## 2023-10-06 DIAGNOSIS — I2510 Atherosclerotic heart disease of native coronary artery without angina pectoris: Secondary | ICD-10-CM | POA: Diagnosis present

## 2023-10-06 DIAGNOSIS — Z794 Long term (current) use of insulin: Secondary | ICD-10-CM

## 2023-10-06 DIAGNOSIS — Z91199 Patient's noncompliance with other medical treatment and regimen due to unspecified reason: Secondary | ICD-10-CM

## 2023-10-06 DIAGNOSIS — N179 Acute kidney failure, unspecified: Secondary | ICD-10-CM | POA: Diagnosis not present

## 2023-10-06 DIAGNOSIS — G4730 Sleep apnea, unspecified: Secondary | ICD-10-CM | POA: Diagnosis present

## 2023-10-06 DIAGNOSIS — I5042 Chronic combined systolic (congestive) and diastolic (congestive) heart failure: Secondary | ICD-10-CM | POA: Diagnosis present

## 2023-10-06 DIAGNOSIS — K219 Gastro-esophageal reflux disease without esophagitis: Secondary | ICD-10-CM | POA: Diagnosis present

## 2023-10-06 DIAGNOSIS — Z7982 Long term (current) use of aspirin: Secondary | ICD-10-CM

## 2023-10-06 DIAGNOSIS — I7389 Other specified peripheral vascular diseases: Principal | ICD-10-CM

## 2023-10-06 DIAGNOSIS — E669 Obesity, unspecified: Secondary | ICD-10-CM | POA: Diagnosis present

## 2023-10-06 DIAGNOSIS — E1122 Type 2 diabetes mellitus with diabetic chronic kidney disease: Secondary | ICD-10-CM | POA: Diagnosis present

## 2023-10-06 LAB — BLOOD GAS, VENOUS
Acid-Base Excess: 6.4 mmol/L — ABNORMAL HIGH (ref 0.0–2.0)
Bicarbonate: 33.3 mmol/L — ABNORMAL HIGH (ref 20.0–28.0)
O2 Saturation: 25.7 %
Patient temperature: 37
pCO2, Ven: 55 mm[Hg] (ref 44–60)
pH, Ven: 7.39 (ref 7.25–7.43)
pO2, Ven: <31 mm[Hg] — CL (ref 32–45)

## 2023-10-06 LAB — CBC
HCT: 50 % (ref 39.0–52.0)
Hemoglobin: 15.3 g/dL (ref 13.0–17.0)
MCH: 29.8 pg (ref 26.0–34.0)
MCHC: 30.6 g/dL (ref 30.0–36.0)
MCV: 97.3 fL (ref 80.0–100.0)
Platelets: 242 10*3/uL (ref 150–400)
RBC: 5.14 MIL/uL (ref 4.22–5.81)
RDW: 16.2 % — ABNORMAL HIGH (ref 11.5–15.5)
WBC: 7.7 10*3/uL (ref 4.0–10.5)
nRBC: 0 % (ref 0.0–0.2)

## 2023-10-06 LAB — I-STAT CHEM 8, ED
BUN: 52 mg/dL — ABNORMAL HIGH (ref 8–23)
Calcium, Ion: 1.12 mmol/L — ABNORMAL LOW (ref 1.15–1.40)
Chloride: 110 mmol/L (ref 98–111)
Creatinine, Ser: 2.7 mg/dL — ABNORMAL HIGH (ref 0.61–1.24)
Glucose, Bld: >700 mg/dL (ref 70–99)
HCT: 51 % (ref 39.0–52.0)
Hemoglobin: 17.3 g/dL — ABNORMAL HIGH (ref 13.0–17.0)
Potassium: 4.5 mmol/L (ref 3.5–5.1)
Sodium: 148 mmol/L — ABNORMAL HIGH (ref 135–145)
TCO2: 28 mmol/L (ref 22–32)

## 2023-10-06 LAB — CBG MONITORING, ED
Glucose-Capillary: 100 mg/dL — ABNORMAL HIGH (ref 70–99)
Glucose-Capillary: 127 mg/dL — ABNORMAL HIGH (ref 70–99)
Glucose-Capillary: 188 mg/dL — ABNORMAL HIGH (ref 70–99)
Glucose-Capillary: 340 mg/dL — ABNORMAL HIGH (ref 70–99)
Glucose-Capillary: 378 mg/dL — ABNORMAL HIGH (ref 70–99)
Glucose-Capillary: 427 mg/dL — ABNORMAL HIGH (ref 70–99)
Glucose-Capillary: 577 mg/dL (ref 70–99)
Glucose-Capillary: 91 mg/dL (ref 70–99)
Glucose-Capillary: >600 mg/dL (ref 70–99)
Glucose-Capillary: >600 mg/dL (ref 70–99)

## 2023-10-06 LAB — BASIC METABOLIC PANEL
Anion gap: 9 (ref 5–15)
BUN: 43 mg/dL — ABNORMAL HIGH (ref 8–23)
CO2: 30 mmol/L (ref 22–32)
Calcium: 9.6 mg/dL (ref 8.9–10.3)
Chloride: 116 mmol/L — ABNORMAL HIGH (ref 98–111)
Creatinine, Ser: 2.08 mg/dL — ABNORMAL HIGH (ref 0.61–1.24)
GFR, Estimated: 32 mL/min — ABNORMAL LOW (ref >60)
Glucose, Bld: 94 mg/dL (ref 70–99)
Potassium: 3.4 mmol/L — ABNORMAL LOW (ref 3.5–5.1)
Sodium: 155 mmol/L — ABNORMAL HIGH (ref 135–145)

## 2023-10-06 MED ORDER — LEVOTHYROXINE SODIUM 25 MCG PO TABS
125.0000 ug | ORAL_TABLET | Freq: Every day | ORAL | Status: DC
  Administered 2023-10-07 – 2023-10-12 (×6): 125 ug via ORAL
  Filled 2023-10-06 (×6): qty 1

## 2023-10-06 MED ORDER — POTASSIUM CHLORIDE 10 MEQ/100ML IV SOLN
10.0000 meq | INTRAVENOUS | Status: AC
  Administered 2023-10-06 (×2): 10 meq via INTRAVENOUS
  Filled 2023-10-06 (×2): qty 100

## 2023-10-06 MED ORDER — SACUBITRIL-VALSARTAN 49-51 MG PO TABS
1.0000 | ORAL_TABLET | Freq: Two times a day (BID) | ORAL | Status: DC
  Administered 2023-10-06: 1 via ORAL
  Filled 2023-10-06 (×2): qty 1

## 2023-10-06 MED ORDER — POLYETHYLENE GLYCOL 3350 17 G PO PACK: 17.0000 g | PACK | Freq: Every day | ORAL | Status: DC | PRN

## 2023-10-06 MED ORDER — ACETAMINOPHEN 325 MG PO TABS: 650.0000 mg | ORAL_TABLET | Freq: Four times a day (QID) | ORAL | Status: DC | PRN

## 2023-10-06 MED ORDER — MELATONIN 5 MG PO TABS: 5.0000 mg | ORAL_TABLET | Freq: Every evening | ORAL | Status: DC | PRN

## 2023-10-06 MED ORDER — ENOXAPARIN SODIUM 30 MG/0.3ML IJ SOSY
30.0000 mg | PREFILLED_SYRINGE | Freq: Every day | INTRAMUSCULAR | Status: DC
  Administered 2023-10-06 – 2023-10-11 (×6): 30 mg via SUBCUTANEOUS
  Filled 2023-10-06 (×6): qty 0.3

## 2023-10-06 MED ORDER — ASPIRIN 81 MG PO TBEC
81.0000 mg | DELAYED_RELEASE_TABLET | Freq: Every day | ORAL | Status: DC
  Administered 2023-10-06 – 2023-10-12 (×7): 81 mg via ORAL
  Filled 2023-10-06 (×7): qty 1

## 2023-10-06 MED ORDER — LACTATED RINGERS IV BOLUS
2000.0000 mL | INTRAVENOUS | Status: AC
  Administered 2023-10-06: 2000 mL via INTRAVENOUS

## 2023-10-06 MED ORDER — INSULIN REGULAR(HUMAN) IN NACL 100-0.9 UT/100ML-% IV SOLN
INTRAVENOUS | Status: DC
  Administered 2023-10-06: 11.5 [IU]/h via INTRAVENOUS
  Administered 2023-10-07: 6 [IU]/h via INTRAVENOUS
  Filled 2023-10-06: qty 100

## 2023-10-06 MED ORDER — DEXTROSE 50 % IV SOLN
0.0000 mL | INTRAVENOUS | Status: DC | PRN
  Administered 2023-10-09: 50 mL via INTRAVENOUS
  Filled 2023-10-06: qty 50

## 2023-10-06 MED ORDER — NICOTINE POLACRILEX 2 MG MT GUM: 2.0000 mg | CHEWING_GUM | OROMUCOSAL | Status: DC | PRN

## 2023-10-06 MED ORDER — ATORVASTATIN CALCIUM 40 MG PO TABS
80.0000 mg | ORAL_TABLET | Freq: Every day | ORAL | Status: DC
  Administered 2023-10-06 – 2023-10-12 (×7): 80 mg via ORAL
  Filled 2023-10-06 (×7): qty 2

## 2023-10-06 MED ORDER — DEXTROSE IN LACTATED RINGERS 5 % IV SOLN: INTRAVENOUS | Status: DC

## 2023-10-06 MED ORDER — LACTATED RINGERS IV SOLN: INTRAVENOUS | Status: DC

## 2023-10-06 MED ORDER — ALBUTEROL SULFATE (2.5 MG/3ML) 0.083% IN NEBU: 2.5000 mg | INHALATION_SOLUTION | RESPIRATORY_TRACT | Status: DC | PRN

## 2023-10-06 MED ORDER — ACETAMINOPHEN 650 MG RE SUPP: 650.0000 mg | Freq: Four times a day (QID) | RECTAL | Status: DC | PRN

## 2023-10-06 MED ORDER — ONDANSETRON HCL 4 MG/2ML IJ SOLN: 4.0000 mg | Freq: Four times a day (QID) | INTRAMUSCULAR | Status: DC | PRN

## 2023-10-06 MED ORDER — NICOTINE 14 MG/24HR TD PT24
14.0000 mg | MEDICATED_PATCH | Freq: Every day | TRANSDERMAL | Status: DC
  Administered 2023-10-07 – 2023-10-12 (×6): 14 mg via TRANSDERMAL
  Filled 2023-10-06 (×6): qty 1

## 2023-10-06 MED ORDER — SODIUM CHLORIDE 0.9 % IV BOLUS
500.0000 mL | Freq: Once | INTRAVENOUS | Status: AC
  Administered 2023-10-06: 500 mL via INTRAVENOUS

## 2023-10-06 MED ORDER — ONDANSETRON HCL 4 MG PO TABS: 4.0000 mg | ORAL_TABLET | Freq: Four times a day (QID) | ORAL | Status: DC | PRN

## 2023-10-06 MED ORDER — PANTOPRAZOLE SODIUM 40 MG PO TBEC
40.0000 mg | DELAYED_RELEASE_TABLET | Freq: Every day | ORAL | Status: DC
  Administered 2023-10-06 – 2023-10-12 (×7): 40 mg via ORAL
  Filled 2023-10-06 (×7): qty 1

## 2023-10-06 MED ORDER — CARVEDILOL 25 MG PO TABS
25.0000 mg | ORAL_TABLET | Freq: Two times a day (BID) | ORAL | Status: DC
  Administered 2023-10-06 – 2023-10-07 (×2): 25 mg via ORAL
  Filled 2023-10-06 (×2): qty 2

## 2023-10-06 NOTE — ED Notes (Signed)
Insulin drip d/c per endotool. Pt BS 91

## 2023-10-06 NOTE — ED Notes (Signed)
Phlebotomy at bedside to obtain labs.

## 2023-10-06 NOTE — ED Notes (Signed)
Chinita Greenland, NP notified of pt BP.

## 2023-10-06 NOTE — ED Provider Notes (Signed)
[place] EMERGENCY DEPARTMENT AT [facility] Provider Note   CSN: [id number] Arrival date & time: 10/06/23  1015     History  Chief Complaint  Patient presents with   Hyperglycemia    Paul [name] is a 77 y.o. male.  HPI     77 y.o. male with medical history significant of lung cancer, hypothyroidism, essential hypertension, type 2 diabetes, systolic CHF LVEF 30-35% in May 2022, coronary artery disease status post CABG, and COPD.  He presents to the [facility] ED with increased weakness starting yesterday.  Patient states that he checked his blood sugar 2 or 3 days ago, but unable to tell me what the numbers were.  He denies any current burning with urination, blood in the urine.  Patient is having urinary frequency.  He denies any chest pain, cough, URI-like symptoms.  Home Medications Prior to Admission medications   Medication Sig Start Date End Date Taking? Authorizing Provider  albuterol (VENTOLIN HFA) 108 (90 Base) MCG/ACT inhaler Inhale 2 puffs into the lungs every 6 (six) hours as needed for wheezing or shortness of breath. 03/12/23   Heilingoetter, Cassandra L, PA-C  aspirin 81 MG chewable tablet Take 81 mg by mouth daily.    [provider]  atorvastatin (LIPITOR) 40 MG tablet Take 20 mg by mouth at bedtime.    [provider]  carvedilol (COREG) 25 MG tablet Take 25 mg by mouth 2 (two) times daily with a meal. 04/14/18   [provider]  clobetasol cream (TEMOVATE) 0.05 % Apply 1 Application topically 2 (two) times daily as needed (back pain).    [provider]  empagliflozin (JARDIANCE) 10 MG TABS tablet Take 1 tablet by mouth daily. 03/03/23   [provider]  fluticasone (FLONASE) 50 MCG/ACT nasal spray Place 2 sprays into both nostrils daily. 04/13/23   Osvaldo Shipper, MD  fluticasone-salmeterol (ADVAIR HFA) [phone] MCG/ACT inhaler Inhale 2 puffs into the lungs 2 (two) times daily. 03/19/23   Rai, Delene Ruffini, MD   guaiFENesin (MUCINEX) 600 MG 12 hr tablet Take 1 tablet (600 mg total) by mouth 2 (two) times daily. 04/12/23   Osvaldo Shipper, MD  ipratropium-albuterol (DUONEB) 0.5-2.5 (3) MG/3ML SOLN Take 3 mLs by nebulization every 4 (four) hours as needed. 04/12/23   Osvaldo Shipper, MD  levothyroxine (SYNTHROID) 125 MCG tablet Take 1 tablet (125 mcg total) by mouth daily before breakfast. 03/13/23   Heilingoetter, Cassandra L, PA-C  lidocaine-prilocaine (EMLA) cream Apply 1 application topically as needed. Patient taking differently: Apply 1 application  topically daily as needed (back pain). 12/10/21   Heilingoetter, Cassandra L, PA-C  Menthol-Methyl Salicylate (MUSCLE RUB) 10-15 % CREA Apply 1 application topically daily as needed for muscle pain.    [provider]  metFORMIN (GLUCOPHAGE) 500 MG tablet Take 1 tablet (500 mg total) by mouth 2 (two) times daily with a meal. 03/19/23 07/17/23  Rai, Ripudeep K, MD  Multiple Vitamins-Minerals ([employer] DAILY MULTIPLE FOR MEN) TABS Take 1 tablet by mouth daily with breakfast.    [provider]  nitroGLYCERIN (NITROSTAT) 0.4 MG SL tablet Place 0.4 mg under the tongue every 5 (five) minutes as needed for chest pain. 09/06/18   [provider]  polyethylene glycol (MIRALAX / GLYCOLAX) 17 g packet Take 17 g by mouth daily. 04/13/23   Osvaldo Shipper, MD  prochlorperazine (COMPAZINE) 10 MG tablet Take 1 tablet (10 mg total) by mouth every 6 (six) hours as needed for nausea or vomiting. 06/17/21  Si Gaul, MD  sacubitril-valsartan (ENTRESTO) 49-51 MG Take 1 tablet by mouth 2 (two) times daily.    [provider]  senna-docusate (SENOKOT-S) 8.6-50 MG tablet Take 2 tablets by mouth 2 (two) times daily. 04/12/23   Osvaldo Shipper, MD  silver sulfADIAZINE (SILVADENE) 1 % cream Apply 1 Application topically daily as needed (rash).    [provider]  triamcinolone cream (KENALOG) 0.1 % Apply 1 Application topically 2 (two) times  daily as needed (rash).    [provider]      Allergies    Patient has no known allergies.    Review of Systems   Review of Systems  All other systems reviewed and are negative.   Physical Exam Updated Vital Signs BP (!) 131/104   Pulse 80   Resp (!) 24   SpO2 96%  Physical Exam Vitals and nursing note reviewed.  Constitutional:      Appearance: He is well-developed.  HENT:     Head: Atraumatic.  Eyes:     Extraocular Movements: Extraocular movements intact.     Pupils: Pupils are equal, round, and reactive to light.  Cardiovascular:     Rate and Rhythm: Normal rate.  Pulmonary:     Effort: Pulmonary effort is normal.  Musculoskeletal:     Cervical back: Neck supple.  Skin:    General: Skin is warm.  Neurological:     Mental Status: He is alert and oriented to person, place, and time.     ED Results / Procedures / Treatments   Labs (all labs ordered are listed, but only abnormal results are displayed) Labs Reviewed  CBC - Abnormal; Notable for the following components:      Result Value   RDW 16.2 (*)    All other components within normal limits  BLOOD GAS, VENOUS - Abnormal; Notable for the following components:   pO2, Ven <31 (*)    Bicarbonate 33.3 (*)    Acid-Base Excess 6.4 (*)    All other components within normal limits  CBG MONITORING, ED - Abnormal; Notable for the following components:   Glucose-Capillary >600 (*)    All other components within normal limits  CBG MONITORING, ED - Abnormal; Notable for the following components:   Glucose-Capillary >600 (*)    All other components within normal limits  I-STAT CHEM 8, ED - Abnormal; Notable for the following components:   Sodium 148 (*)    BUN 52 (*)    Creatinine, Ser 2.70 (*)    Glucose, Bld >700 (*)    Calcium, Ion 1.12 (*)    Hemoglobin 17.3 (*)    All other components within normal limits  URINALYSIS, ROUTINE W REFLEX MICROSCOPIC  BASIC METABOLIC PANEL  BASIC METABOLIC PANEL   BASIC METABOLIC PANEL  BASIC METABOLIC PANEL  OSMOLALITY    EKG None  Radiology No results found.  Procedures Procedures    Medications Ordered in ED Medications  insulin regular, human (MYXREDLIN) 100 units/ 100 mL infusion (has no administration in time range)  lactated ringers infusion (has no administration in time range)  dextrose 50 % solution 0-50 mL (has no administration in time range)  dextrose 5 % in lactated ringers infusion (has no administration in time range)  potassium chloride 10 mEq in 100 mL IVPB (10 mEq Intravenous New Bag/Given 10/06/23 1206)  lactated ringers bolus 2,000 mL (2,000 mLs Intravenous New Bag/Given 10/06/23 1203)    ED Course/ Medical Decision Making/ A&P  Medical Decision Making Amount and/or Complexity of Data Reviewed Labs: ordered.  Risk Prescription drug management. Decision regarding hospitalization.   77 year old patient comes in with chief complaint of elevated blood sugar.  And weakness.  Elevated blood sugar was found during our triage.  Patient has history of hyperlipidemia, GERD, hypertension and type 2 diabetes.  Differential diagnosis for him includes UTI, DKA, HHS, AKI, severe electrolyte abnormality, underlying infection.  Basic labs ordered.  Patient's blood sugars over 700. No anion gap, pH is normal.  We will start him on insulin drip.  Patient does have AKI, likely prerenal. Repeat assessment still consistent with admission for HHS and not infection.  Final Clinical Impression(s) / ED Diagnoses Final diagnoses:  HHS (hypothenar hammer syndrome) (HCC)  AKI (acute kidney injury) (HCC)    Rx / DC Orders ED Discharge Orders     None         Derwood Kaplan, MD 10/06/23 1706

## 2023-10-06 NOTE — ED Triage Notes (Signed)
Pt bib ptar from home. Pt with hx DM and took his insulin yesterday as prescribed. Has not checked his blood sugar in several days. States today increased weakness which caused a fall, no injury due to fall. PTAR with CBG reading HI. VSS.

## 2023-10-06 NOTE — ED Notes (Signed)
Dr. Jerolyn Center messaged regarding pt BP being low.

## 2023-10-06 NOTE — H&P (Addendum)
History and Physical    Patient: Paul [name] GEX:[id number] DOB: July 19, 1946 DOA: 10/06/2023 DOS: the patient was seen and examined on 10/06/2023 PCP: [name]  Patient coming from: Home  Chief Complaint:  Chief Complaint  Patient presents with   Hyperglycemia   HPI: Paul [name] is a 77 y.o. male with medical history significant of lung cancer, hypothyroidism, essential hypertension, type 2 diabetes, systolic CHF LVEF 30-35% in May 2022, coronary artery disease status post CABG, and COPD.  He presents to the [facility] ED with increased weakness and post fall. He denies trauma, no headstrike, denies change in sensorium or loss of consciousness preceding the fall. Paul [name] reports over the past weeks he has experienced increased thirst and frequency of urination. Denies polyphagia. Sisters at bedside report that Paul [name]'s speech sounds slightly more slurred to them and that he is confused. On my evaluation Paul [name] has some delayed responses and uses incorrect words in conversation. For example, he told me he takes medications "annually" when he meant daily. On exam he does not have any focal neurological deficits.    ED Course: On arrival to [facility] ED Paul. [name] was noted to be hypertensive BP 131/104, heart rate 80, respiratory rate 24, SpO2 96%.  There is no documented temperature.  On arrival fingerstick blood glucose was greater than 600 and lab confirmation resulted as greater than 700.  VBG without acidosis pH 7.39, pCO2 55, pO2 less than 31, bicarb 33.3, O2 saturation 25.7%.  Sodium 148, creatinine elevated at 2.70 and BUN elevated at 52, iCal 1.12, hemoglobin 17.3.  Patient was given 2 L fluid bolus, 20 mEq of potassium, and started on IV insulin via Endo tool. [facility] contacted for admission.  Review of Systems: As mentioned in the history of present illness. All other systems reviewed and are negative. Past Medical History:  Diagnosis Date   CHF  (congestive heart failure) (HCC)    Coronary artery disease    Diabetes mellitus without complication (HCC)    History of radiation therapy    Right lung- 11/27/21-01/10/22- Dr. Antony Blackbird   Hypertension    Hypothyroidism    Ischemic cardiomyopathy    Pneumonia    a long time ago   Sleep apnea    no longer uses a cpap   Past Surgical History:  Procedure Laterality Date   BRONCHIAL BIOPSY  06/04/2021   Procedure: BRONCHIAL BIOPSIES;  Surgeon: Josephine Igo, DO;  Location: [facility];  Service: Pulmonary;;   BRONCHIAL BRUSHINGS  06/04/2021   Procedure: BRONCHIAL BRUSHINGS;  Surgeon: Josephine Igo, DO;  Location: [facility];  Service: Pulmonary;;   BRONCHIAL NEEDLE ASPIRATION BIOPSY  06/04/2021   Procedure: BRONCHIAL NEEDLE ASPIRATION BIOPSIES;  Surgeon: Josephine Igo, DO;  Location: [facility];  Service: Pulmonary;;   BRONCHIAL WASHINGS  06/04/2021   Procedure: BRONCHIAL WASHINGS;  Surgeon: Josephine Igo, DO;  Location: [facility];  Service: Pulmonary;;   CARDIAC SURGERY     COLONOSCOPY     CORONARY ARTERY BYPASS GRAFT     2019 at [facility]   VIDEO BRONCHOSCOPY WITH ENDOBRONCHIAL NAVIGATION Right 06/04/2021   Procedure: VIDEO BRONCHOSCOPY WITH ENDOBRONCHIAL NAVIGATION;  Surgeon: Josephine Igo, DO;  Location: [facility]OS[facility];  Service: Pulmonary;  Laterality: Right;   VIDEO BRONCHOSCOPY WITH ENDOBRONCHIAL ULTRASOUND  06/04/2021   Procedure: VIDEO BRONCHOSCOPY WITH ENDOBRONCHIAL ULTRASOUND;  Surgeon: Josephine Igo, DO;  Location: [facility]OSCOPY;  Service: Pulmonary;;   Social History:  reports that he  has been smoking cigarettes. He started smoking about 59 years ago. He has a 29.9 pack-year smoking history. He has never used smokeless tobacco. He reports that he does not drink alcohol and does not use drugs.  No Known Allergies  Family History  Family history unknown: Yes    Prior to Admission medications   Medication Sig Start Date End Date Taking? Authorizing  Provider  albuterol (VENTOLIN HFA) 108 (90 Base) MCG/ACT inhaler Inhale 2 puffs into the lungs every 6 (six) hours as needed for wheezing or shortness of breath. 03/12/23   Heilingoetter, Cassandra L, PA-C  aspirin 81 MG chewable tablet Take 81 mg by mouth daily.    [provider]  atorvastatin (LIPITOR) 40 MG tablet Take 20 mg by mouth at bedtime.    [provider]  carvedilol (COREG) 25 MG tablet Take 25 mg by mouth 2 (two) times daily with a meal. 04/14/18   [provider]  clobetasol cream (TEMOVATE) 0.05 % Apply 1 Application topically 2 (two) times daily as needed (back pain).    [provider]  empagliflozin (JARDIANCE) 10 MG TABS tablet Take 1 tablet by mouth daily. 03/03/23   [provider]  fluticasone (FLONASE) 50 MCG/ACT nasal spray Place 2 sprays into both nostrils daily. 04/13/23   Osvaldo Shipper, MD  fluticasone-salmeterol (ADVAIR HFA) 5036808941 MCG/ACT inhaler Inhale 2 puffs into the lungs 2 (two) times daily. 03/19/23   Rai, Delene Ruffini, MD  guaiFENesin (MUCINEX) 600 MG 12 hr tablet Take 1 tablet (600 mg total) by mouth 2 (two) times daily. 04/12/23   Osvaldo Shipper, MD  ipratropium-albuterol (DUONEB) 0.5-2.5 (3) MG/3ML SOLN Take 3 mLs by nebulization every 4 (four) hours as needed. 04/12/23   Osvaldo Shipper, MD  levothyroxine (SYNTHROID) 125 MCG tablet Take 1 tablet (125 mcg total) by mouth daily before breakfast. 03/13/23   Heilingoetter, Cassandra L, PA-C  lidocaine-prilocaine (EMLA) cream Apply 1 application topically as needed. Patient taking differently: Apply 1 application  topically daily as needed (back pain). 12/10/21   Heilingoetter, Cassandra L, PA-C  Menthol-Methyl Salicylate (MUSCLE RUB) 10-15 % CREA Apply 1 application topically daily as needed for muscle pain.    [provider]  metFORMIN (GLUCOPHAGE) 500 MG tablet Take 1 tablet (500 mg total) by mouth 2 (two) times daily with a meal. 03/19/23 07/17/23  Rai, Ripudeep K,  MD  Multiple Vitamins-Minerals ([employer] DAILY MULTIPLE FOR MEN) TABS Take 1 tablet by mouth daily with breakfast.    [provider]  nitroGLYCERIN (NITROSTAT) 0.4 MG SL tablet Place 0.4 mg under the tongue every 5 (five) minutes as needed for chest pain. 09/06/18   [provider]  polyethylene glycol (MIRALAX / GLYCOLAX) 17 g packet Take 17 g by mouth daily. 04/13/23   Osvaldo Shipper, MD  prochlorperazine (COMPAZINE) 10 MG tablet Take 1 tablet (10 mg total) by mouth every 6 (six) hours as needed for nausea or vomiting. 06/17/21   Si Gaul, MD  sacubitril-valsartan (ENTRESTO) 49-51 MG Take 1 tablet by mouth 2 (two) times daily.    [provider]  senna-docusate (SENOKOT-S) 8.6-50 MG tablet Take 2 tablets by mouth 2 (two) times daily. 04/12/23   Osvaldo Shipper, MD  silver sulfADIAZINE (SILVADENE) 1 % cream Apply 1 Application topically daily as needed (rash).    [provider]  triamcinolone cream (KENALOG) 0.1 % Apply 1 Application topically 2 (two) times daily as needed (rash).    [provider]    Physical Exam: Vitals:  10/06/23 1042  BP: (!) 131/104  Pulse: 80  Resp: (!) 24  SpO2: 96%    Constitutional: NAD, calm, comfortable Eyes: PERRL, lids and conjunctivae normal ENMT: Mucous membranes are dry. Posterior pharynx clear of any exudate or lesions. Poor dentition.  Neck: normal, supple, no masses, no thyromegaly Respiratory: clear to auscultation bilaterally, no wheezing, no crackles. Normal respiratory effort. No accessory muscle use.  Cardiovascular: Regular rate and rhythm, no murmurs / rubs / gallops. No extremity edema. 1+ radial and pedal pulses. Lower legs and arms are cool.  Abdomen: no tenderness, no masses palpated. No hepatosplenomegaly. Bowel sounds positive.  Musculoskeletal: no clubbing / cyanosis. No joint deformity upper and lower extremities. Good ROM, no contractures. Normal muscle tone.  Skin: no rashes, lesions,  ulcers.  Neurologic:  Alert and oriented x 3. Normal mood. No focal neurological deficits  Data Reviewed: CBC    Component Value Date/Time   WBC 7.7 10/06/2023 1052   RBC 5.14 10/06/2023 1052   HGB 17.3 (H) 10/06/2023 1101   HGB 13.1 07/14/2023 1234   HCT 51.0 10/06/2023 1101   PLT 242 10/06/2023 1052   PLT 302 07/14/2023 1234   MCV 97.3 10/06/2023 1052   MCH 29.8 10/06/2023 1052   MCHC 30.6 10/06/2023 1052   RDW 16.2 (H) 10/06/2023 1052   LYMPHSABS 1.3 07/14/2023 1234   MONOABS 0.7 07/14/2023 1234   EOSABS 0.2 07/14/2023 1234   BASOSABS 0.1 07/14/2023 1234   CMP     Component Value Date/Time   NA 148 (H) 10/06/2023 1101   K 4.5 10/06/2023 1101   CL 110 10/06/2023 1101   CO2 26 07/14/2023 1234   GLUCOSE >700 (HH) 10/06/2023 1101   BUN 52 (H) 10/06/2023 1101   CREATININE 2.70 (H) 10/06/2023 1101   CREATININE 1.54 (H) 07/14/2023 1234   CALCIUM 9.5 07/14/2023 1234   PROT 7.7 07/14/2023 1234   ALBUMIN 4.0 07/14/2023 1234   AST 13 (L) 07/14/2023 1234   ALT 7 07/14/2023 1234   ALKPHOS 125 07/14/2023 1234   BILITOT 0.4 07/14/2023 1234   GFRNONAA 46 (L) 07/14/2023 1234   Venous Blood Gas result:  pO2 <31; pCO2 55; pH 7.39;  HCO3 33.3, %O2 Sat 25.7.    Assessment and Plan: #HHS #Diabetes Mellitus, Type II - IV Insulin per Endotool - IVF while on Endotool, transition to PO if Creatinine improving once IV insulin stopped. Monitor respiratory status closely with last LVEF 30-35%. - CBG as directed per Endo tool - Trend BMP q4H  - Replete K PRN  #Confusion #Slurred Speech Likely in setting of HHS and blood glucose >700. Sisters at bedside report patient's speech sounds slurred to them.  -CT Head   #Acute Kidney Injury on Chronic Kidney Disease  -IVF as above, transition to PO hydration if Creatinine improved when IV insulin is stopped. Monitor respiratory status closely with last LVEF 30-35% - Hold home Jardiance  #Hypothyroid - Continue home  Synthroid  #Fall -PT/OT Evaluation -Fall Precautions  #CHF Not acutely exacerbated - Continue home Coreg,Entresto - Hold home Jardiance  #COPD Not acutely exacerbated - Continue home inhalers - PRN albuterol nebulizer  #CAD #Hyperlipidemia - Continue home aspirin - Continue home atorvastatin  #GERD -Protonix  #Adenocarcinoma of (R) Lung -Follow up with outpatient providers   VTE prophylaxis: Lovenox   GI prophylaxis: Protonix Diet: NPO sips w meds Access: PIV Lines: NONE Code Status: FULL Telemetry: Yes Disposition: Observe in Stepdown  Advance Care Planning: Code Status: FULL  Consults: N/A  Family Communication: Sisters at bedside  Severity of Illness: The appropriate patient status for this patient is OBSERVATION. Observation status is judged to be reasonable and necessary in order to provide the required intensity of service to ensure the patient's safety. The patient's presenting symptoms, physical exam findings, and initial radiographic and laboratory data in the context of their medical condition is felt to place them at decreased risk for further clinical deterioration. Furthermore, it is anticipated that the patient will be medically stable for discharge from the hospital within 2 midnights of admission.   To reach the provider On-Call:   7AM- 7PM see care teams to locate the attending and reach out to them via [email or web address] Password: [email or web address] 7PM-7AM contact night-coverage If you still have difficulty reaching the appropriate provider, please page the [facility] (Director on Call) for [facility] on amion for assistance  This document was prepared using Conservation officer, historic buildings and may include unintentional dictation errors.  Bishop Limbo DNP, MBA, FNP-BC, PMHNP-BC Nurse Practitioner [facility] Pager [phone]

## 2023-10-06 NOTE — ED Notes (Signed)
Pt placed on 2L [place] due to O2 being at 88% on room air

## 2023-10-06 NOTE — ED Notes (Signed)
Sister, Haywood Lasso called and wants to be added to emergency contacts.  [phone]

## 2023-10-06 NOTE — ED Notes (Signed)
Care assumed of pt. Pt moved from room 8 to room 15. Upon entering room, pt had urine covering sheets. Pt changed into new sheets and cleaned up. Pt is hypotensive, pt placed in trendelenburg.

## 2023-10-07 ENCOUNTER — Other Ambulatory Visit: Payer: Self-pay

## 2023-10-07 DIAGNOSIS — I130 Hypertensive heart and chronic kidney disease with heart failure and stage 1 through stage 4 chronic kidney disease, or unspecified chronic kidney disease: Secondary | ICD-10-CM | POA: Diagnosis present

## 2023-10-07 DIAGNOSIS — Z7984 Long term (current) use of oral hypoglycemic drugs: Secondary | ICD-10-CM | POA: Diagnosis not present

## 2023-10-07 DIAGNOSIS — E785 Hyperlipidemia, unspecified: Secondary | ICD-10-CM | POA: Diagnosis present

## 2023-10-07 DIAGNOSIS — J449 Chronic obstructive pulmonary disease, unspecified: Secondary | ICD-10-CM | POA: Diagnosis present

## 2023-10-07 DIAGNOSIS — Z6834 Body mass index (BMI) 34.0-34.9, adult: Secondary | ICD-10-CM | POA: Diagnosis not present

## 2023-10-07 DIAGNOSIS — Z951 Presence of aortocoronary bypass graft: Secondary | ICD-10-CM | POA: Diagnosis not present

## 2023-10-07 DIAGNOSIS — Z794 Long term (current) use of insulin: Secondary | ICD-10-CM | POA: Diagnosis not present

## 2023-10-07 DIAGNOSIS — K2100 Gastro-esophageal reflux disease with esophagitis, without bleeding: Secondary | ICD-10-CM | POA: Diagnosis not present

## 2023-10-07 DIAGNOSIS — I2510 Atherosclerotic heart disease of native coronary artery without angina pectoris: Secondary | ICD-10-CM | POA: Diagnosis present

## 2023-10-07 DIAGNOSIS — F17210 Nicotine dependence, cigarettes, uncomplicated: Secondary | ICD-10-CM | POA: Diagnosis present

## 2023-10-07 DIAGNOSIS — Z79890 Hormone replacement therapy (postmenopausal): Secondary | ICD-10-CM | POA: Diagnosis not present

## 2023-10-07 DIAGNOSIS — E1100 Type 2 diabetes mellitus with hyperosmolarity without nonketotic hyperglycemic-hyperosmolar coma (NKHHC): Secondary | ICD-10-CM | POA: Diagnosis present

## 2023-10-07 DIAGNOSIS — C3491 Malignant neoplasm of unspecified part of right bronchus or lung: Secondary | ICD-10-CM | POA: Diagnosis present

## 2023-10-07 DIAGNOSIS — E039 Hypothyroidism, unspecified: Secondary | ICD-10-CM | POA: Diagnosis present

## 2023-10-07 DIAGNOSIS — E861 Hypovolemia: Secondary | ICD-10-CM | POA: Diagnosis present

## 2023-10-07 DIAGNOSIS — I5042 Chronic combined systolic (congestive) and diastolic (congestive) heart failure: Secondary | ICD-10-CM | POA: Diagnosis present

## 2023-10-07 DIAGNOSIS — G9341 Metabolic encephalopathy: Secondary | ICD-10-CM | POA: Diagnosis present

## 2023-10-07 DIAGNOSIS — N179 Acute kidney failure, unspecified: Secondary | ICD-10-CM | POA: Diagnosis present

## 2023-10-07 DIAGNOSIS — K219 Gastro-esophageal reflux disease without esophagitis: Secondary | ICD-10-CM | POA: Diagnosis present

## 2023-10-07 DIAGNOSIS — E669 Obesity, unspecified: Secondary | ICD-10-CM | POA: Diagnosis present

## 2023-10-07 DIAGNOSIS — E876 Hypokalemia: Secondary | ICD-10-CM | POA: Diagnosis present

## 2023-10-07 DIAGNOSIS — I7389 Other specified peripheral vascular diseases: Secondary | ICD-10-CM

## 2023-10-07 DIAGNOSIS — N1831 Chronic kidney disease, stage 3a: Secondary | ICD-10-CM | POA: Diagnosis present

## 2023-10-07 DIAGNOSIS — I10 Essential (primary) hypertension: Secondary | ICD-10-CM

## 2023-10-07 DIAGNOSIS — Z923 Personal history of irradiation: Secondary | ICD-10-CM | POA: Diagnosis not present

## 2023-10-07 DIAGNOSIS — E11649 Type 2 diabetes mellitus with hypoglycemia without coma: Secondary | ICD-10-CM | POA: Diagnosis present

## 2023-10-07 DIAGNOSIS — E1122 Type 2 diabetes mellitus with diabetic chronic kidney disease: Secondary | ICD-10-CM | POA: Diagnosis present

## 2023-10-07 LAB — COMPREHENSIVE METABOLIC PANEL
ALT: 12 U/L (ref 0–44)
AST: 29 U/L (ref 15–41)
Albumin: 3.2 g/dL — ABNORMAL LOW (ref 3.5–5.0)
Alkaline Phosphatase: 90 U/L (ref 38–126)
Anion gap: 6 (ref 5–15)
BUN: 40 mg/dL — ABNORMAL HIGH (ref 8–23)
CO2: 30 mmol/L (ref 22–32)
Calcium: 8.9 mg/dL (ref 8.9–10.3)
Chloride: 113 mmol/L — ABNORMAL HIGH (ref 98–111)
Creatinine, Ser: 2.07 mg/dL — ABNORMAL HIGH (ref 0.61–1.24)
GFR, Estimated: 32 mL/min — ABNORMAL LOW (ref >60)
Glucose, Bld: 208 mg/dL — ABNORMAL HIGH (ref 70–99)
Potassium: 3.5 mmol/L (ref 3.5–5.1)
Sodium: 149 mmol/L — ABNORMAL HIGH (ref 135–145)
Total Bilirubin: 0.7 mg/dL (ref 0.3–1.2)
Total Protein: 6.9 g/dL (ref 6.5–8.1)

## 2023-10-07 LAB — BASIC METABOLIC PANEL
Anion gap: 8 (ref 5–15)
BUN: 37 mg/dL — ABNORMAL HIGH (ref 8–23)
CO2: 27 mmol/L (ref 22–32)
Calcium: 7.9 mg/dL — ABNORMAL LOW (ref 8.9–10.3)
Chloride: 115 mmol/L — ABNORMAL HIGH (ref 98–111)
Creatinine, Ser: 1.8 mg/dL — ABNORMAL HIGH (ref 0.61–1.24)
GFR, Estimated: 38 mL/min — ABNORMAL LOW (ref >60)
Glucose, Bld: 219 mg/dL — ABNORMAL HIGH (ref 70–99)
Potassium: 3.9 mmol/L (ref 3.5–5.1)
Sodium: 150 mmol/L — ABNORMAL HIGH (ref 135–145)

## 2023-10-07 LAB — CBG MONITORING, ED
Glucose-Capillary: 131 mg/dL — ABNORMAL HIGH (ref 70–99)
Glucose-Capillary: 169 mg/dL — ABNORMAL HIGH (ref 70–99)
Glucose-Capillary: 210 mg/dL — ABNORMAL HIGH (ref 70–99)
Glucose-Capillary: 213 mg/dL — ABNORMAL HIGH (ref 70–99)
Glucose-Capillary: 214 mg/dL — ABNORMAL HIGH (ref 70–99)
Glucose-Capillary: 219 mg/dL — ABNORMAL HIGH (ref 70–99)

## 2023-10-07 LAB — BETA-HYDROXYBUTYRIC ACID: Beta-Hydroxybutyric Acid: 0.08 mmol/L (ref 0.05–0.27)

## 2023-10-07 LAB — URINALYSIS, ROUTINE W REFLEX MICROSCOPIC
Bilirubin Urine: NEGATIVE
Glucose, UA: >=500 mg/dL — AB
Hgb urine dipstick: NEGATIVE
Ketones, ur: NEGATIVE mg/dL
Leukocytes,Ua: NEGATIVE
Nitrite: NEGATIVE
Protein, ur: 30 mg/dL — AB
Specific Gravity, Urine: 1.02 (ref 1.005–1.030)
pH: 5 (ref 5.0–8.0)

## 2023-10-07 LAB — HEMOGLOBIN A1C
Hgb A1c MFr Bld: 12.7 % — ABNORMAL HIGH (ref 4.8–5.6)
Mean Plasma Glucose: 317.79 mg/dL

## 2023-10-07 LAB — CBC
HCT: 40.6 % (ref 39.0–52.0)
Hemoglobin: 12.6 g/dL — ABNORMAL LOW (ref 13.0–17.0)
MCH: 30.4 pg (ref 26.0–34.0)
MCHC: 31 g/dL (ref 30.0–36.0)
MCV: 97.8 fL (ref 80.0–100.0)
Platelets: 197 10*3/uL (ref 150–400)
RBC: 4.15 MIL/uL — ABNORMAL LOW (ref 4.22–5.81)
RDW: 16.3 % — ABNORMAL HIGH (ref 11.5–15.5)
WBC: 8.5 10*3/uL (ref 4.0–10.5)
nRBC: 0 % (ref 0.0–0.2)

## 2023-10-07 LAB — GLUCOSE, CAPILLARY
Glucose-Capillary: 170 mg/dL — ABNORMAL HIGH (ref 70–99)
Glucose-Capillary: 153 mg/dL — ABNORMAL HIGH (ref 70–99)
Glucose-Capillary: 228 mg/dL — ABNORMAL HIGH (ref 70–99)
Glucose-Capillary: 200 mg/dL — ABNORMAL HIGH (ref 70–99)

## 2023-10-07 LAB — OSMOLALITY: Osmolality: 343 mosm/kg (ref 275–295)

## 2023-10-07 MED ORDER — INSULIN ASPART 100 UNIT/ML IJ SOLN
3.0000 [IU] | Freq: Three times a day (TID) | INTRAMUSCULAR | Status: DC
  Administered 2023-10-07 – 2023-10-08 (×4): 3 [IU] via SUBCUTANEOUS
  Filled 2023-10-07: qty 0.03

## 2023-10-07 MED ORDER — INSULIN ASPART 100 UNIT/ML IJ SOLN
0.0000 [IU] | Freq: Three times a day (TID) | INTRAMUSCULAR | Status: DC
  Administered 2023-10-07 (×2): 3 [IU] via SUBCUTANEOUS
  Administered 2023-10-07: 5 [IU] via SUBCUTANEOUS
  Administered 2023-10-08: 3 [IU] via SUBCUTANEOUS
  Administered 2023-10-08: 5 [IU] via SUBCUTANEOUS
  Filled 2023-10-07: qty 0.15

## 2023-10-07 MED ORDER — INSULIN ASPART 100 UNIT/ML IJ SOLN
0.0000 [IU] | Freq: Every day | INTRAMUSCULAR | Status: DC
  Administered 2023-10-09 – 2023-10-11 (×2): 2 [IU] via SUBCUTANEOUS
  Filled 2023-10-07: qty 0.05

## 2023-10-07 MED ORDER — LACTATED RINGERS IV SOLN: INTRAVENOUS | Status: AC

## 2023-10-07 MED ORDER — INSULIN ASPART 100 UNIT/ML IJ SOLN
0.0000 [IU] | INTRAMUSCULAR | Status: DC
  Administered 2023-10-07: 5 [IU] via SUBCUTANEOUS
  Filled 2023-10-07: qty 0.15

## 2023-10-07 MED ORDER — INSULIN GLARGINE-YFGN 100 UNIT/ML ~~LOC~~ SOLN
40.0000 [IU] | Freq: Every day | SUBCUTANEOUS | Status: DC
  Administered 2023-10-07: 40 [IU] via SUBCUTANEOUS
  Filled 2023-10-07 (×2): qty 0.4

## 2023-10-07 NOTE — Progress Notes (Signed)
[facility]                                                                              Paul [name], is a 77 y.o. male, DOB - Dec 06, 1946, QQV:[id number] Admit date - 10/06/2023    Outpatient Primary MD for the patient is [name]  LOS - 0  days  Chief Complaint  Patient presents with   Hyperglycemia       Brief summary    Patient is a 77 year old male with lung CA, hypothyroidism, essential hypertension, diabetes mellitus type 2, systolic CHF, EF 30 to 35% in May 2022, CAD status post CABG, COPD presented to ED with increasing weakness.  Patient reported that over the past weeks he had experienced increased thirst and frequency of urination.  Patient's sister at the bedside felt that his speech sounded slightly more slurred and confused.  Otherwise no new FND's. In ED, BP 131/104, heart rate 80, RR 24, O2 sats 96%. VBG showed pH 7.39, pCO2 55, bicarb 33.  Sodium 148, creatinine elevated at 2.7, glucose > 700  Assessment & Plan    Principal Problem:   Hyperosmolar hyperglycemic state (HHS) (HCC) in the setting of uncontrolled diabetes mellitus type 2 -This morning noted, endochondral had been discontinued overnight, patient had not received basal insulin -Per home medications, started on Semglee 40 units asap and added sliding scale insulin, moderate, NovoLog 3 units 3 times daily AC -BHB 0.08, hemoglobin A1c 12.7 -Diabetic coordinator consulted  Active Problems:   AKI (acute kidney injury) (HCC) on CKD stage IIIa -Hold Jardiance, Entresto, metformin, -Presented with creatinine of 2.7, on admission.  Creatinine was 1.5 on 07/14/2023 -Creatinine 2.0 today, continue gentle hydration  Acute metabolic encephalopathy ?  Slurred speech -Currently alert and oriented, likely in the setting of #1 -CT head showed no acute intracranial abnormality, stable atrophy  Chronic combined systolic and diastolic CHF -2D echo 04/2021 showed EF of 30 to 35%, G1  DD -Currently appears hypovolemic, hold Jardiance, Entresto -Aced on gentle hydration     GERD (gastroesophageal reflux disease) -Continue Protonix     Essential hypertension -BP soft, hold beta-blocker, Entresto, continue gentle hydration    Hyperlipidemia, CAD Continue atorvastatin  Adenocarcinoma right lung -Outpatient follow-up with Dr. Arbutus Ped  Obesity Estimated body mass index is 34.18 kg/m as calculated from the following:   Height as of 04/08/23: 5\' 8"  (1.727 m).   Weight as of 08/07/23: 102 kg.  Code Status: Full code DVT Prophylaxis:  enoxaparin (LOVENOX) injection 30 mg Start: 10/06/23 2200   Level of Care: Level of care: Telemetry Family Communication: Updated patient Disposition Plan:      Remains inpatient appropriate: Possible DC home tomorrow   Procedures:    Consultants:     Antimicrobials:   Anti-infectives (From admission, onward)    None          Medications  aspirin EC  81 mg Oral Daily   atorvastatin  80 mg Oral Daily   enoxaparin (LOVENOX) injection  30 mg Subcutaneous QHS   insulin aspart  0-15 Units Subcutaneous TID WC   insulin aspart  0-5 Units Subcutaneous QHS  insulin aspart  3 Units Subcutaneous TID WC   insulin glargine-yfgn  40 Units Subcutaneous Daily   levothyroxine  125 mcg Oral Q0600   nicotine  14 mg Transdermal Daily   pantoprazole  40 mg Oral Daily      Subjective:   Paul [name] was seen and examined today.  BP borderline, denies any dizziness, chest pain, shortness of breath, fevers.  Overnight Endo tool was stopped  Objective:   Vitals:   10/07/23 0948 10/07/23 0950 10/07/23 1003 10/07/23 1147  BP: (!) 87/57 94/65 (!) 85/64 90/72  Pulse:   62 60  Resp:   18 18  Temp:   (!) 97 F (36.1 C)   TempSrc:      SpO2:   90% 97%    Intake/Output Summary (Last 24 hours) at 10/07/2023 1433 Last data filed at 10/07/2023 1400 Gross per 24 hour  Intake 2510.61 ml  Output --  Net 2510.61 ml     Wt  Readings from Last 3 Encounters:  08/07/23 102 kg  04/14/23 106.9 kg  03/16/23 109.8 kg     Exam General: Alert and oriented x 3, NAD Cardiovascular: S1 S2 auscultated,  RRR Respiratory: Clear to auscultation bilaterally, no wheezing Gastrointestinal: Soft, nontender, nondistended, + bowel sounds Ext: no pedal edema bilaterally Neuro: Strength 5/5 upper and lower extremities bilaterally Psych: Normal affect     Data Reviewed:  I have personally reviewed following labs    CBC Lab Results  Component Value Date   WBC 8.5 10/07/2023   RBC 4.15 (L) 10/07/2023   HGB 12.6 (L) 10/07/2023   HCT 40.6 10/07/2023   MCV 97.8 10/07/2023   MCH 30.4 10/07/2023   PLT 197 10/07/2023   MCHC 31.0 10/07/2023   RDW 16.3 (H) 10/07/2023   LYMPHSABS 1.3 07/14/2023   MONOABS 0.7 07/14/2023   EOSABS 0.2 07/14/2023   BASOSABS 0.1 07/14/2023     Last metabolic panel Lab Results  Component Value Date   NA 149 (H) 10/07/2023   K 3.5 10/07/2023   CL 113 (H) 10/07/2023   CO2 30 10/07/2023   BUN 40 (H) 10/07/2023   CREATININE 2.07 (H) 10/07/2023   GLUCOSE 208 (H) 10/07/2023   GFRNONAA 32 (L) 10/07/2023   GFRAA 50 (L) 01/13/2019   CALCIUM 8.9 10/07/2023   PROT 6.9 10/07/2023   ALBUMIN 3.2 (L) 10/07/2023   BILITOT 0.7 10/07/2023   ALKPHOS 90 10/07/2023   AST 29 10/07/2023   ALT 12 10/07/2023   ANIONGAP 6 10/07/2023    CBG (last 3)  Recent Labs    10/07/23 0811 10/07/23 0914 10/07/23 1055  GLUCAP 169* 170* 153*      Coagulation Profile: No results for input(s): "INR", "PROTIME" in the last 168 hours.   Radiology Studies: I have personally reviewed the imaging studies  DG CHEST PORT 1 VIEW  Result Date: 10/06/2023 CLINICAL DATA:  Cough. EXAM: PORTABLE CHEST 1 VIEW COMPARISON:  Radiograph 04/07/2023.  PET CT 09/15/2023 reviewed FINDINGS: Prior median sternotomy. Cardiomegaly is stable. Small right pleural effusion and right infrahilar opacity, also seen on prior PET. The left  lung base opacity is difficult to accurately assessed due to soft tissue attenuation. No pneumothorax. IMPRESSION: 1. Small right pleural effusion and right infrahilar opacity, also seen on prior PET-CT. 2. Stable cardiomegaly. Electronically Signed   By: Narda Rutherford M.D.   On: 10/06/2023 18:37   CT HEAD WO CONTRAST ( )  Result Date: 10/06/2023 CLINICAL DATA:  Mental status change, unknown cause  slurred speech per family EXAM: CT HEAD WITHOUT CONTRAST TECHNIQUE: Contiguous axial images were obtained from the base of the skull through the vertex without intravenous contrast. RADIATION DOSE REDUCTION: This exam was performed according to the departmental dose-optimization program which includes automated exposure control, adjustment of the mA and/or kV according to patient size and/or use of iterative reconstruction technique. COMPARISON:  Brain MRI 11/14/2021 FINDINGS: Brain: No intracranial hemorrhage, mass effect, or midline shift. Stable degree of atrophy. No hydrocephalus. The basilar cisterns are patent. No evidence of territorial infarct or acute ischemia. No extra-axial or intracranial fluid collection. Vascular: No hyperdense vessel or unexpected calcification. Skull: No fracture or focal lesion. Sinuses/Orbits: Paranasal sinuses and mastoid air cells are clear. The visualized orbits are unremarkable. Other: No scalp hematoma IMPRESSION: 1. No acute intracranial abnormality. 2. Stable atrophy. Electronically Signed   By: Narda Rutherford M.D.   On: 10/06/2023 15:50       Raylen Ken M.D. [facility] 10/07/2023, 2:33 PM  Available via Epic secure chat 7am-7pm After 7 pm, please refer to night coverage provider listed on amion.

## 2023-10-07 NOTE — Evaluation (Signed)
Occupational Therapy Evaluation Patient Details Name: Paul [name] MRN: [id number] DOB: [birth year] Today's Date: 10/07/2023   History of Present Illness Patient is a 77 year old male who presented with increased weakness with increased thirst and urination. Patient was admitted with hyperosmolar hyperglycemic state in setting of uncontrolled Dm type II, AKI, acute metabolic encephalopathy. PMH: GERD, DM II, hyperlipidemia, adenocarcinoma R lung.   Clinical Impression   Patient is a 77 year old male who was admitted for above. Patient reported being independent in ADLs at home with no AD prior level. Currently, patient is +2 for standing with increased dizziness and inability to maintain balance with attempted weight shifting. Patient was noted to have decreased functional activity tolerance, decreased endurance, decreased standing balance, decreased safety awareness, and decreased knowledge of AD/AE impacting participation in ADLs. Patient will benefit from continued inpatient follow up therapy, <3 hours/day. Patient would continue to benefit from skilled OT services at this time while admitted and after d/c to address noted deficits in order to improve overall safety and independence in ADLs.   Blood pressures Supine: 94/71 mmhg Sitting: 97/54 mmhg Standing: 83/59 mmhg unable to remain standing with increased dizziness returned to bed.         If plan is discharge home, recommend the following: A lot of help with bathing/dressing/bathroom;A lot of help with walking and/or transfers;Assistance with cooking/housework;Direct supervision/assist for medications management;Assist for transportation;Help with stairs or ramp for entrance;Direct supervision/assist for financial management    Functional Status Assessment  Patient has had a recent decline in their functional status and demonstrates the ability to make significant improvements in function in a reasonable and predictable amount of  time.  Equipment Recommendations  None recommended by OT       Precautions / Restrictions Precautions Precaution Comments: check BP Restrictions Weight Bearing Restrictions: No      Mobility Bed Mobility Overal bed mobility: Needs Assistance Bed Mobility: Supine to Sit, Sit to Supine     Supine to sit: Mod assist Sit to supine: Mod assist   General bed mobility comments: with increased time.          Balance Overall balance assessment: Needs assistance Sitting-balance support: Bilateral upper extremity supported, Feet unsupported Sitting balance-Leahy Scale: Fair     Standing balance support: Reliant on assistive device for balance, Bilateral upper extremity supported Standing balance-Leahy Scale: Poor           ADL either performed or assessed with clinical judgement   ADL Overall ADL's : Needs assistance/impaired   Eating/Feeding Details (indicate cue type and reason): declined Grooming: Minimal assistance;Sitting   Upper Body Bathing: Minimal assistance;Sitting   Lower Body Bathing: Moderate assistance;Sitting/lateral leans   Upper Body Dressing : Minimal assistance;Sitting   Lower Body Dressing: Moderate assistance;Sitting/lateral leans   Toilet Transfer: +2 for physical assistance;+2 for safety/equipment Toilet Transfer Details (indicate cue type and reason): for standing at EOB for safety with increased dizziness. patient keeping eyes closed with soft BP. Toileting- Clothing Manipulation and Hygiene: Maximal assistance;Sit to/from stand               Vision   Vision Assessment?: No apparent visual deficits Additional Comments: dizzy with movement.            Pertinent Vitals/Pain Pain Assessment Pain Assessment: No/denies pain     Extremity/Trunk Assessment Upper Extremity Assessment Upper Extremity Assessment: Overall WFL for tasks assessed   Lower Extremity Assessment Lower Extremity Assessment: Defer to PT evaluation    Cervical /  Trunk Assessment Cervical / Trunk Assessment: Normal   Communication     Cognition Arousal: Lethargic Behavior During Therapy: Flat affect Overall Cognitive Status: No family/caregiver present to determine baseline cognitive functioning         General Comments: patient reported today was october 2024. patient needed encouragement to                Home Living Family/patient expects to be discharged to:: Private residence Living Arrangements: Children;Non-relatives/Friends Available Help at Discharge: Friend(s);Available PRN/intermittently Type of Home: Apartment Home Access: Stairs to enter Entrance Stairs-Number of Steps: 13 Entrance Stairs-Rails: Left Home Layout: One level     Bathroom Shower/Tub: Tub/shower unit;Sponge bathes at baseline   [employer]: Standard Bathroom Accessibility: Yes How Accessible: Accessible via walker Home Equipment: Cane - single point          Prior Functioning/Environment Prior Level of Function : Independent/Modified Independent;Driving             Mobility Comments: Reports could ambulate in community and perform stairs; stated occasionally used cane; had 1 recent fall due to legs giving away ADLs Comments: independent with adls and iadls per report        OT Problem List: Decreased activity tolerance;Impaired balance (sitting and/or standing);Decreased coordination;Decreased safety awareness;Decreased knowledge of precautions;Decreased knowledge of use of DME or AE      OT Treatment/Interventions: Self-care/ADL training;Therapeutic exercise;DME and/or AE instruction;Therapeutic activities;Patient/family education;Balance training    OT Goals(Current goals can be found in the care plan section) Acute Rehab OT Goals Patient Stated Goal: to go home OT Goal Formulation: With patient Time For Goal Achievement: 10/21/23 Potential to Achieve Goals: Fair  OT Frequency: Min 1X/week    Co-evaluation  PT/OT/SLP Co-Evaluation/Treatment: Yes Reason for Co-Treatment: For patient/therapist safety;To address functional/ADL transfers PT goals addressed during session: Mobility/safety with mobility OT goals addressed during session: ADL's and self-care      AM-PAC OT "6 Clicks" Daily Activity     Outcome Measure Help from another person eating meals?: A Little Help from another person taking care of personal grooming?: A Little Help from another person toileting, which includes using toliet, bedpan, or urinal?: A Lot Help from another person bathing (including washing, rinsing, drying)?: A Lot Help from another person to put on and taking off regular upper body clothing?: A Little Help from another person to put on and taking off regular lower body clothing?: A Lot 6 Click Score: 15   End of Session Nurse Communication: Mobility status  Activity Tolerance: Patient limited by fatigue;Other (comment) (dizziness) Patient left: in bed;with call bell/phone within reach;with bed alarm set  OT Visit Diagnosis: Unsteadiness on feet (R26.81);Other abnormalities of gait and mobility (R26.89);Muscle weakness (generalized) (M62.81);Dizziness and giddiness (R42)                Time: 9147-8295 OT Time Calculation (min): 15 min Charges:  OT General Charges $OT Visit: 1 Visit OT Evaluation $OT Eval Low Complexity: 1 Low  Dreyah Montrose OTR/L, MS Acute Rehabilitation Department Office# [phone]   Selinda Flavin 10/07/2023, 3:53 PM

## 2023-10-07 NOTE — Evaluation (Signed)
Physical Therapy Evaluation Patient Details Name: Paul [name] MRN: [id number] DOB: 1946-12-18 Today's Date: 10/07/2023  History of Present Illness  Patient is a 77 year old male who presented with increased weakness with increased thirst and urination. Patient was admitted with hyperosmolar hyperglycemic state in setting of uncontrolled Dm type II, AKI, acute metabolic encephalopathy. PMH: GERD, DM II, hyperlipidemia, adenocarcinoma R lung.  Clinical Impression  Pt admitted with above diagnosis.  Pt currently with functional limitations due to the deficits listed below (see PT Problem List). Pt will benefit from acute skilled PT to increase their independence and safety with mobility to allow discharge.    The patient was   lethargic , reports fatigue. Patient did participate so orthostatic BPs could be taken.   Supine: 94/71 mmhg Sitting: 97/54 mmhg Standing: 83/59 mmhg unable to remain standing with increased dizziness returned to bed. e patient reporting fatigue, required encouragement to  participate.  Patient reports being independent and driving,  has sisters living  in his home. No family present to verify.  Patient presents with weakness, determine  firm disposition  with further PT while in acute care.        If plan is discharge home, recommend the following: Two people to help with walking and/or transfers;A lot of help with bathing/dressing/bathroom;Assistance with cooking/housework;Assist for transportation;Supervision due to cognitive status   Can travel by private vehicle   No    Equipment Recommendations Rolling walker (2 wheels)  Recommendations for Other Services       Functional Status Assessment Patient has had a recent decline in their functional status and demonstrates the ability to make significant improvements in function in a reasonable and predictable amount of time.     Precautions / Restrictions Precautions Precautions: Fall Precaution Comments:  check BP Restrictions Weight Bearing Restrictions: No      Mobility  Bed Mobility Overal bed mobility: Needs Assistance Bed Mobility: Supine to Sit, Sit to Supine     Supine to sit: Mod assist Sit to supine: Min assist   General bed mobility comments: with increased time. patient able to place  legs onto bed    Transfers Overall transfer level: Needs assistance   Transfers: Sit to/from Stand Sit to Stand: Mod assist, +2 safety/equipment           General transfer comment: steady assist to stand , steadied self  holding back of recliner.\ patient had wide base, reported feeling dizzy. stood  for BP then sat down    Ambulation/Gait                  Stairs            Wheelchair Mobility     Tilt Bed    Modified Rankin (Stroke Patients Only)       Balance Overall balance assessment: Needs assistance, History of Falls Sitting-balance support: Bilateral upper extremity supported, Feet unsupported Sitting balance-Leahy Scale: Fair     Standing balance support: Reliant on assistive device for balance, Bilateral upper extremity supported Standing balance-Leahy Scale: Poor                               Pertinent Vitals/Pain Pain Assessment Pain Assessment: No/denies pain    Home Living Family/patient expects to be discharged to:: Private residence Living Arrangements: Children;Non-relatives/Friends Available Help at Discharge: Friend(s);Available PRN/intermittently Type of Home: Apartment Home Access: Stairs to enter Entrance Stairs-Rails: Left Entrance Stairs-Number of Steps: 13  Home Layout: One level Home Equipment: Cane - single point      Prior Function Prior Level of Function : Independent/Modified Independent;Driving             Mobility Comments: Reports could ambulate in community and perform stairs; stated occasionally used cane; had 1 recent fall due to legs giving away ADLs Comments: independent with adls and  iadls per report     Extremity/Trunk Assessment   Upper Extremity Assessment Upper Extremity Assessment: Overall WFL for tasks assessed    Lower Extremity Assessment Lower Extremity Assessment: Generalized weakness    Cervical / Trunk Assessment Cervical / Trunk Assessment: Normal  Communication   Communication Communication: No apparent difficulties  Cognition Arousal: Lethargic Behavior During Therapy: Flat affect Overall Cognitive Status: No family/caregiver present to determine baseline cognitive functioning                                 General Comments: patient reported today was october 2024. patient needed encouragement to participate        General Comments      Exercises     Assessment/Plan    PT Assessment Patient needs continued PT services  PT Problem List Decreased strength;Decreased activity tolerance;Decreased mobility;Decreased balance;Cardiopulmonary status limiting activity;Decreased knowledge of precautions       PT Treatment Interventions DME instruction;Gait training;Functional mobility training;Therapeutic activities;Therapeutic exercise;Cognitive remediation    PT Goals (Current goals can be found in the Care Plan section)  Acute Rehab PT Goals Patient Stated Goal: sleep PT Goal Formulation: With patient Time For Goal Achievement: 10/21/23 Potential to Achieve Goals: Fair    Frequency Min 1X/week     Co-evaluation PT/OT/SLP Co-Evaluation/Treatment: Yes Reason for Co-Treatment: For patient/therapist safety;To address functional/ADL transfers PT goals addressed during session: Mobility/safety with mobility OT goals addressed during session: ADL's and self-care       AM-PAC PT "6 Clicks" Mobility  Outcome Measure Help needed turning from your back to your side while in a flat bed without using bedrails?: A Lot Help needed moving from lying on your back to sitting on the side of a flat bed without using bedrails?: A  Little Help needed moving to and from a bed to a chair (including a wheelchair)?: Total Help needed standing up from a chair using your arms (e.g., wheelchair or bedside chair)?: Total Help needed to walk in hospital room?: Total Help needed climbing 3-5 steps with a railing? : Total 6 Click Score: 9    End of Session   Activity Tolerance: Patient limited by fatigue;Treatment limited secondary to medical complications (Comment) Patient left: in bed;with bed alarm set;with call bell/phone within reach Nurse Communication: Mobility status PT Visit Diagnosis: Unsteadiness on feet (R26.81);History of falling (Z91.81);Difficulty in walking, not elsewhere classified (R26.2)    Time: 6962-9528 PT Time Calculation (min) (ACUTE ONLY): 18 min   Charges:   PT Evaluation $PT Eval Low Complexity: 1 Low   PT General Charges $$ ACUTE PT VISIT: 1 Visit         Blanchard Kelch PT Acute Rehabilitation Services Office [phone] Weekend pager-[phone]   Rada Hay 10/07/2023, 4:26 PM

## 2023-10-07 NOTE — Progress Notes (Signed)
   10/07/23 0905  Assess: MEWS Score  Temp 98.1 F (36.7 C)  BP (!) 60/40  Pulse Rate 70  Resp (!) 22  Level of Consciousness Alert  SpO2 100 %  O2 Device Room Air  Assess: MEWS Score  MEWS Temp 0  MEWS Systolic 3  MEWS Pulse 0  MEWS RR 1  MEWS LOC 0  MEWS Score 4  MEWS Score Color Red  Assess: if the MEWS score is Yellow or Red  Were vital signs accurate and taken at a resting state? Yes  Does the patient meet 2 or more of the SIRS criteria? No  MEWS guidelines implemented  Yes, red  Treat  MEWS Interventions Considered administering scheduled or prn medications/treatments as ordered  Take Vital Signs  Increase Vital Sign Frequency  Red: Q1hr x2, continue Q4hrs until patient remains green for 12hrs  Escalate  MEWS: Escalate Red: Discuss with charge nurse and notify provider. Consider notifying RRT. If remains red for 2 hours consider need for higher level of care  Notify: Charge Nurse/RN  Name of Charge Nurse/RN Notified Launa Flight, RN  Provider Notification  Provider Name/Title Dr Isidoro Donning  Date Provider Notified 10/07/23  Time Provider Notified 0920  Method of Notification Page  Notification Reason Change in status (BP low)  Provider response No new orders  Date of Provider Response 10/07/23  Time of Provider Response 0924  Notify: Rapid Response  Name of Rapid Response RN Notified Britta Mccreedy May, RN  Date Rapid Response Notified 10/07/23  Time Rapid Response Notified 0925  Assess: SIRS CRITERIA  SIRS Temperature  0  SIRS Pulse 0  SIRS Respirations  1  SIRS WBC 0  SIRS Score Sum  1

## 2023-10-07 NOTE — ED Notes (Signed)
ED TO INPATIENT HANDOFF REPORT  ED Nurse Name and Phone #: Suann Larry Name/Age/Gender Paul [name] Junes 77 y.o. male Room/Bed: WA15/WA15  Code Status   Code Status: Full Code  Home/SNF/Other Home Patient oriented to: self, place, time, and situation Is this baseline? 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 Pt with hx DM and took his insulin yesterday as prescribed. Has not checked his blood sugar in several days. States today increased weakness which caused a fall, no injury due to fall. PTAR with CBG reading HI. VSS.    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 Antony Blackbird   Hypertension    Hypothyroidism    Ischemic cardiomyopathy    Pneumonia    a long time ago   Sleep apnea    no longer uses a cpap   Past Surgical History:  Procedure Laterality Date   BRONCHIAL BIOPSY  06/04/2021    Procedure: BRONCHIAL BIOPSIES;  Surgeon: Josephine Igo, DO;  Location: [facility];  Service: Pulmonary;;   BRONCHIAL BRUSHINGS  06/04/2021   Procedure: BRONCHIAL BRUSHINGS;  Surgeon: Josephine Igo, DO;  Location: [facility];  Service: Pulmonary;;   BRONCHIAL NEEDLE ASPIRATION BIOPSY  06/04/2021   Procedure: BRONCHIAL NEEDLE ASPIRATION BIOPSIES;  Surgeon: Josephine Igo, DO;  Location: [facility];  Service: Pulmonary;;   BRONCHIAL WASHINGS  06/04/2021   Procedure: BRONCHIAL WASHINGS;  Surgeon: Josephine Igo, DO;  Location: [facility];  Service: Pulmonary;;   CARDIAC SURGERY     COLONOSCOPY     CORONARY ARTERY BYPASS GRAFT     2019 at [facility]   VIDEO BRONCHOSCOPY WITH ENDOBRONCHIAL NAVIGATION Right 06/04/2021   Procedure: VIDEO BRONCHOSCOPY WITH ENDOBRONCHIAL NAVIGATION;  Surgeon: Josephine Igo, DO;  Location: [facility];  Service: Pulmonary;  Laterality: Right;   VIDEO BRONCHOSCOPY WITH ENDOBRONCHIAL ULTRASOUND  06/04/2021   Procedure: VIDEO BRONCHOSCOPY WITH ENDOBRONCHIAL ULTRASOUND;  Surgeon: Josephine Igo, DO;  Location: [facility];  Service: Pulmonary;;     A IV Location/Drains/Wounds Patient Lines/Drains/Airways Status     Active Line/Drains/Airways     Name Placement date Placement time Site Days   Peripheral IV 09/15/23 22 G 1.75" Anterior;Right Forearm 09/15/23  1219  Forearm  22   Peripheral IV 10/06/23 22 G Left Antecubital 10/06/23  1205  Antecubital  1   Peripheral IV 10/06/23 20 G Right Antecubital 10/06/23  2247  Antecubital  1   Wound / Incision (Open or Dehisced) 04/09/23 Irritant Dermatitis (Moisture Associated Skin Damage) Scrotum Right;Left 04/09/23  0500  Scrotum  181            Intake/Output Last 24 hours  Intake/Output Summary (Last 24 hours) at 10/07/2023 0803 Last data filed at 10/07/2023 0236 Gross per 24 hour  Intake 1280.73 ml  Output --  Net 1280.73 ml    Labs/Imaging Results for orders placed or performed during the  hospital encounter of 10/06/23 (from the past 48 hour(s))  CBG monitoring, ED     Status: Abnormal   Collection Time: 10/06/23 10:34 AM  Result Value Ref Range   Glucose-Capillary >600 (HH) 70 - 99 mg/dL    Comment: Glucose reference range applies only to samples taken after fasting for at least 8 hours.  CBC     Status: Abnormal   Collection Time: 10/06/23 10:52 AM  Result Value Ref Range   WBC 7.7 4.0 - 10.5 K/uL   RBC 5.14 4.22 - 5.81 MIL/uL   Hemoglobin 15.3 13.0 - 17.0 g/dL   HCT 30.8 65.7 - 84.6 %   MCV 97.3 80.0 - 100.0 fL   MCH 29.8 26.0 - 34.0 pg   MCHC 30.6 30.0 - 36.0 g/dL   RDW 96.2 (H) 95.2 - 84.1 %   Platelets 242 150 - 400 K/uL   nRBC 0.0 0.0 - 0.2 %    Comment: Performed at [facility], [address], [place], [place] [address]  Blood gas, venous (at [facility] and [facility])     Status: Abnormal   Collection Time: 10/06/23 11:00 AM  Result Value Ref Range   pH, Ven 7.39 7.25 - 7.43   pCO2, Ven 55 44 - 60 mmHg   pO2, Ven <31 (LL) 32 - 45 mmHg    Comment: CRITICAL RESULT CALLED TO, READ BACK BY AND VERIFIED WITH: BAIN, C. RN AT 1113 ON 10/06/23. FA    Bicarbonate 33.3 (H) 20.0 - 28.0 mmol/L   Acid-Base Excess 6.4 (H) 0.0 - 2.0 mmol/L   O2 Saturation 25.7 %   Patient temperature 37.0     Comment: Performed at [facility], [address], [place], [place] [address]  I-stat chem 8, ed     Status: Abnormal   Collection Time: 10/06/23 11:01 AM  Result Value Ref Range   Sodium 148 (H) 135 - 145 mmol/L   Potassium 4.5 3.5 - 5.1 mmol/L   Chloride 110 98 - 111 mmol/L   BUN 52 (H) 8 - 23 mg/dL   Creatinine, Ser 5.36 (H) 0.61 - 1.24 mg/dL   Glucose, Bld >644 (HH) 70 - 99 mg/dL    Comment: Glucose reference range applies only to samples taken after fasting for at least 8 hours.   Calcium, Ion 1.12 (L) 1.15 - 1.40 mmol/L   TCO2 28 22 - 32 mmol/L   Hemoglobin 17.3 (H) 13.0 - 17.0 g/dL   HCT 03.4 74.2 - 59.5 %   Comment NOTIFIED PHYSICIAN    CBG monitoring, ED     Status: Abnormal   Collection Time: 10/06/23 12:32 PM  Result Value Ref Range   Glucose-Capillary >600 (HH) 70 - 99 mg/dL    Comment: Glucose reference range applies only to samples taken after fasting for at least 8 hours.  CBG monitoring, ED     Status: Abnormal   Collection Time: 10/06/23  2:14 PM  Result Value Ref Range   Glucose-Capillary [facility])  70 - 99 mg/dL    Comment: Glucose reference range applies only to samples taken after fasting for at least 8 hours.   Comment 1 Document in Chart   CBG monitoring, ED     Status: Abnormal   Collection Time: 10/06/23  3:23 PM  Result Value Ref Range   Glucose-Capillary 427 (H) 70 - 99 mg/dL    Comment: Glucose reference range applies only to samples taken after fasting for at least 8 hours.  CBG monitoring, ED     Status: Abnormal   Collection Time: 10/06/23  4:20 PM  Result Value Ref Range   Glucose-Capillary 378 (H) 70 - 99 mg/dL    Comment: Glucose reference range applies only to samples taken after fasting for at least 8 hours.  CBG monitoring, ED     Status: Abnormal   Collection Time: 10/06/23  5:11 PM  Result Value Ref Range   Glucose-Capillary 340 (H) 70 - 99 mg/dL    Comment: Glucose reference range applies only to samples taken after fasting for at least 8 hours.  CBG monitoring, ED     Status: Abnormal   Collection Time: 10/06/23  7:19 PM  Result Value Ref Range   Glucose-Capillary 188 (H) 70 - 99 mg/dL    Comment: Glucose reference range applies only to samples taken after fasting for at least 8 hours.  CBG monitoring, ED     Status: Abnormal   Collection Time: 10/06/23  9:51 PM  Result Value Ref Range   Glucose-Capillary 127 (H) 70 - 99 mg/dL    Comment: Glucose reference range applies only to samples taken after fasting for at least 8 hours.  CBG monitoring, ED     Status: None   Collection Time: 10/06/23 10:49 PM  Result Value Ref Range   Glucose-Capillary 91 70 - 99 mg/dL    Comment:  Glucose reference range applies only to samples taken after fasting for at least 8 hours.  Basic metabolic panel     Status: Abnormal   Collection Time: 10/06/23 10:50 PM  Result Value Ref Range   Sodium 155 (H) 135 - 145 mmol/L   Potassium 3.4 (L) 3.5 - 5.1 mmol/L   Chloride 116 (H) 98 - 111 mmol/L   CO2 30 22 - 32 mmol/L   Glucose, Bld 94 70 - 99 mg/dL    Comment: Glucose reference range applies only to samples taken after fasting for at least 8 hours.   BUN 43 (H) 8 - 23 mg/dL   Creatinine, Ser 7.82 (H) 0.61 - 1.24 mg/dL   Calcium 9.6 8.9 - 95.6 mg/dL   GFR, Estimated 32 (L) >60 mL/min    Comment: (NOTE) Calculated using the CKD-EPI Creatinine Equation (2021)    Anion gap 9 5 - 15    Comment: Performed at [facility], [address] [place], [place] [address]  CBG monitoring, ED     Status: Abnormal   Collection Time: 10/06/23 11:46 PM  Result Value Ref Range   Glucose-Capillary 100 (H) 70 - 99 mg/dL    Comment: Glucose reference range applies only to samples taken after fasting for at least 8 hours.  CBG monitoring, ED     Status: Abnormal   Collection Time: 10/07/23 12:53 AM  Result Value Ref Range   Glucose-Capillary 131 (H) 70 - 99 mg/dL    Comment: Glucose reference range applies only to samples taken after fasting for at least 8 hours.  CBG monitoring, ED  Status: Abnormal   Collection Time: 10/07/23  1:55 AM  Result Value Ref Range   Glucose-Capillary 210 (H) 70 - 99 mg/dL    Comment: Glucose reference range applies only to samples taken after fasting for at least 8 hours.  CBG monitoring, ED     Status: Abnormal   Collection Time: 10/07/23  2:36 AM  Result Value Ref Range   Glucose-Capillary 219 (H) 70 - 99 mg/dL    Comment: Glucose reference range applies only to samples taken after fasting for at least 8 hours.  Basic metabolic panel     Status: Abnormal   Collection Time: 10/07/23  3:20 AM  Result Value Ref Range   Sodium 150 (H) 135 -  145 mmol/L   Potassium 3.9 3.5 - 5.1 mmol/L    Comment: HEMOLYSIS AT THIS LEVEL MAY AFFECT RESULT   Chloride 115 (H) 98 - 111 mmol/L   CO2 27 22 - 32 mmol/L   Glucose, Bld 219 (H) 70 - 99 mg/dL    Comment: Glucose reference range applies only to samples taken after fasting for at least 8 hours.   BUN 37 (H) 8 - 23 mg/dL   Creatinine, Ser 7.82 (H) 0.61 - 1.24 mg/dL   Calcium 7.9 (L) 8.9 - 10.3 mg/dL   GFR, Estimated 38 (L) >60 mL/min    Comment: (NOTE) Calculated using the CKD-EPI Creatinine Equation (2021)    Anion gap 8 5 - 15    Comment: Performed at [facility], [address], [place], [place] [address]  CBG monitoring, ED     Status: Abnormal   Collection Time: 10/07/23  3:42 AM  Result Value Ref Range   Glucose-Capillary 214 (H) 70 - 99 mg/dL    Comment: Glucose reference range applies only to samples taken after fasting for at least 8 hours.  CBG monitoring, ED     Status: Abnormal   Collection Time: 10/07/23  4:08 AM  Result Value Ref Range   Glucose-Capillary 213 (H) 70 - 99 mg/dL    Comment: Glucose reference range applies only to samples taken after fasting for at least 8 hours.  Comprehensive metabolic panel     Status: Abnormal   Collection Time: 10/07/23  5:00 AM  Result Value Ref Range   Sodium 149 (H) 135 - 145 mmol/L   Potassium 3.5 3.5 - 5.1 mmol/L   Chloride 113 (H) 98 - 111 mmol/L   CO2 30 22 - 32 mmol/L   Glucose, Bld 208 (H) 70 - 99 mg/dL    Comment: Glucose reference range applies only to samples taken after fasting for at least 8 hours.   BUN 40 (H) 8 - 23 mg/dL   Creatinine, Ser 3.08 (H) 0.61 - 1.24 mg/dL   Calcium 8.9 8.9 - 65.7 mg/dL   Total Protein 6.9 6.5 - 8.1 g/dL   Albumin 3.2 (L) 3.5 - 5.0 g/dL   AST 29 15 - 41 U/L   ALT 12 0 - 44 U/L   Alkaline Phosphatase 90 38 - 126 U/L   Total Bilirubin 0.7 0.3 - 1.2 mg/dL   GFR, Estimated 32 (L) >60 mL/min    Comment: (NOTE) Calculated using the CKD-EPI Creatinine Equation  (2021)    Anion gap 6 5 - 15    Comment: Performed at [facility], [address], [place], [place] [address]  Urinalysis, Routine w reflex microscopic -Urine, Clean Catch     Status: Abnormal   Collection Time: 10/07/23  5:10 AM  Result Value Ref Range   Color, Urine YELLOW YELLOW   APPearance CLEAR CLEAR   Specific Gravity, Urine 1.020 1.005 - 1.030   pH 5.0 5.0 - 8.0   Glucose, UA >=500 (A) NEGATIVE mg/dL   Hgb urine dipstick NEGATIVE NEGATIVE   Bilirubin Urine NEGATIVE NEGATIVE   Ketones, ur NEGATIVE NEGATIVE mg/dL   Protein, ur 30 (A) NEGATIVE mg/dL   Nitrite NEGATIVE NEGATIVE   Leukocytes,Ua NEGATIVE NEGATIVE   RBC / HPF 0-5 0 - 5 RBC/hpf   WBC, UA 0-5 0 - 5 WBC/hpf   Bacteria, UA RARE (A) NONE SEEN   Squamous Epithelial / HPF 0-5 0 - 5 /HPF   Mucus PRESENT    Hyaline Casts, UA PRESENT     Comment: Performed at [facility], [address] [place], [place] [address]  CBC     Status: Abnormal   Collection Time: 10/07/23  6:55 AM  Result Value Ref Range   WBC 8.5 4.0 - 10.5 K/uL   RBC 4.15 (L) 4.22 - 5.81 MIL/uL   Hemoglobin 12.6 (L) 13.0 - 17.0 g/dL   HCT 60.4 54.0 - 98.1 %   MCV 97.8 80.0 - 100.0 fL   MCH 30.4 26.0 - 34.0 pg   MCHC 31.0 30.0 - 36.0 g/dL   RDW 19.1 (H) 47.8 - 29.5 %   Platelets 197 150 - 400 K/uL   nRBC 0.0 0.0 - 0.2 %    Comment: Performed at [facility], [address], [place], [place] [address]   DG CHEST PORT 1 VIEW  Result Date: 10/06/2023 CLINICAL DATA:  Cough. EXAM: PORTABLE CHEST 1 VIEW COMPARISON:  Radiograph 04/07/2023.  PET CT 09/15/2023 reviewed FINDINGS: Prior median sternotomy. Cardiomegaly is stable. Small right pleural effusion and right infrahilar opacity, also seen on prior PET. The left lung base opacity is difficult to accurately assessed due to soft tissue attenuation. No pneumothorax. IMPRESSION: 1. Small right pleural effusion and right infrahilar opacity, also seen on  prior PET-CT. 2. Stable cardiomegaly. Electronically Signed   By: Narda Rutherford M.D.   On: 10/06/2023 18:37   CT HEAD WO CONTRAST ( )  Result Date: 10/06/2023 CLINICAL DATA:  Mental status change, unknown cause slurred speech per family EXAM: CT HEAD WITHOUT CONTRAST TECHNIQUE: Contiguous axial images were obtained from the base of the skull through the vertex without intravenous contrast. RADIATION DOSE REDUCTION: This exam was performed according to the departmental dose-optimization program which includes automated exposure control, adjustment of the mA and/or kV according to patient size and/or use of iterative reconstruction technique. COMPARISON:  Brain MRI 11/14/2021 FINDINGS: Brain: No intracranial hemorrhage, mass effect, or midline shift. Stable degree of atrophy. No hydrocephalus. The basilar cisterns are patent. No evidence of territorial infarct or acute ischemia. No extra-axial or intracranial fluid collection. Vascular: No hyperdense vessel or unexpected calcification. Skull: No fracture or focal lesion. Sinuses/Orbits: Paranasal sinuses and mastoid air cells are clear. The visualized orbits are unremarkable. Other: No scalp hematoma IMPRESSION: 1. No acute intracranial abnormality. 2. Stable atrophy. Electronically Signed   By: Narda Rutherford M.D.   On: 10/06/2023 15:50    Pending Labs Unresulted Labs (From admission, onward)     Start     Ordered   10/08/23 0500  Basic metabolic panel  Daily,   R      10/07/23 0357   10/07/23 0751  Hemoglobin A1c  ONCE - URGENT,   URGENT  Comments: To assess prior glycemic control    10/07/23 0750   10/07/23 0645  Beta-hydroxybutyric acid  ONCE - URGENT,   URGENT        10/07/23 0644   10/06/23 1120  Basic metabolic panel  (Hyperglycemic Hyperosmolar State (HHS))  STAT Now then every 4 hours ,   STAT      10/06/23 1121   10/06/23 1120  Osmolality  (Hyperglycemic Hyperosmolar State (HHS))  Once,   URGENT        10/06/23 1121             Vitals/Pain Today's Vitals   10/07/23 0245 10/07/23 0600 10/07/23 0646 10/07/23 0700  BP: 91/66 93/69  96/73  Pulse: 61 68  68  Resp: 15 18  18   Temp: 97.8 F (36.6 C)  97.9 F (36.6 C)   TempSrc:   Oral   SpO2: 97% 95%  93%  PainSc:        Isolation Precautions No active isolations  Medications Medications  dextrose 50 % solution 0-50 mL (has no administration in time range)  enoxaparin (LOVENOX) injection 30 mg (30 mg Subcutaneous Given 10/06/23 2217)  acetaminophen (TYLENOL) tablet 650 mg (has no administration in time range)    Or  acetaminophen (TYLENOL) suppository 650 mg (has no administration in time range)  polyethylene glycol (MIRALAX / GLYCOLAX) packet 17 g (has no administration in time range)  ondansetron (ZOFRAN) tablet 4 mg (has no administration in time range)    Or  ondansetron (ZOFRAN) injection 4 mg (has no administration in time range)  melatonin tablet 5 mg (has no administration in time range)  pantoprazole (PROTONIX) EC tablet 40 mg (40 mg Oral Given 10/06/23 1856)  albuterol (PROVENTIL) (2.5 MG/3ML) 0.083% nebulizer solution 2.5 mg (has no administration in time range)  aspirin EC tablet 81 mg (81 mg Oral Given 10/06/23 2005)  atorvastatin (LIPITOR) tablet 80 mg (80 mg Oral Given 10/06/23 2005)  carvedilol (COREG) tablet 25 mg (25 mg Oral Given 10/07/23 0759)  levothyroxine (SYNTHROID) tablet 125 mcg (125 mcg Oral Given 10/07/23 0515)  nicotine polacrilex (NICORETTE) gum 2 mg (has no administration in time range)  nicotine (NICODERM CQ - dosed in mg/24 hours) patch 14 mg (14 mg Transdermal Patient Refused/Not Given 10/06/23 1904)  lactated ringers infusion ( Intravenous New Bag/Given 10/07/23 0755)  insulin glargine-yfgn (SEMGLEE) injection 40 Units (has no administration in time range)  insulin aspart (novoLOG) injection 0-5 Units (has no administration in time range)  insulin aspart (novoLOG) injection 0-15 Units (has no administration in time  range)  insulin aspart (novoLOG) injection 3 Units (has no administration in time range)  lactated ringers bolus 2,000 mL (0 mLs Intravenous Stopped 10/06/23 1535)  potassium chloride 10 mEq in 100 mL IVPB (0 mEq Intravenous Stopped 10/06/23 2001)  sodium chloride 0.9 % bolus 500 mL (0 mLs Intravenous Stopped 10/07/23 0236)    Mobility non-ambulatory     Focused Assessments Hyperglycemia   R Recommendations: See Admitting Provider Note  Report given to:   Additional Notes: .

## 2023-10-08 ENCOUNTER — Other Ambulatory Visit (HOSPITAL_COMMUNITY): Payer: Self-pay

## 2023-10-08 ENCOUNTER — Inpatient Hospital Stay (HOSPITAL_COMMUNITY): Payer: No Typology Code available for payment source

## 2023-10-08 ENCOUNTER — Encounter: Payer: Self-pay | Admitting: Internal Medicine

## 2023-10-08 DIAGNOSIS — K2100 Gastro-esophageal reflux disease with esophagitis, without bleeding: Secondary | ICD-10-CM | POA: Diagnosis not present

## 2023-10-08 DIAGNOSIS — E7800 Pure hypercholesterolemia, unspecified: Secondary | ICD-10-CM

## 2023-10-08 DIAGNOSIS — I10 Essential (primary) hypertension: Secondary | ICD-10-CM | POA: Diagnosis not present

## 2023-10-08 DIAGNOSIS — E1100 Type 2 diabetes mellitus with hyperosmolarity without nonketotic hyperglycemic-hyperosmolar coma (NKHHC): Secondary | ICD-10-CM | POA: Diagnosis not present

## 2023-10-08 DIAGNOSIS — N179 Acute kidney failure, unspecified: Secondary | ICD-10-CM | POA: Diagnosis not present

## 2023-10-08 DIAGNOSIS — N1831 Chronic kidney disease, stage 3a: Secondary | ICD-10-CM

## 2023-10-08 LAB — GLUCOSE, CAPILLARY
Glucose-Capillary: 118 mg/dL — ABNORMAL HIGH (ref 70–99)
Glucose-Capillary: 165 mg/dL — ABNORMAL HIGH (ref 70–99)
Glucose-Capillary: 214 mg/dL — ABNORMAL HIGH (ref 70–99)
Glucose-Capillary: 181 mg/dL — ABNORMAL HIGH (ref 70–99)
Glucose-Capillary: 119 mg/dL — ABNORMAL HIGH (ref 70–99)

## 2023-10-08 LAB — BASIC METABOLIC PANEL
Anion gap: 8 (ref 5–15)
BUN: 31 mg/dL — ABNORMAL HIGH (ref 8–23)
CO2: 27 mmol/L (ref 22–32)
Calcium: 8.4 mg/dL — ABNORMAL LOW (ref 8.9–10.3)
Chloride: 110 mmol/L (ref 98–111)
Creatinine, Ser: 1.65 mg/dL — ABNORMAL HIGH (ref 0.61–1.24)
GFR, Estimated: 42 mL/min — ABNORMAL LOW (ref >60)
Glucose, Bld: 130 mg/dL — ABNORMAL HIGH (ref 70–99)
Potassium: 3.1 mmol/L — ABNORMAL LOW (ref 3.5–5.1)
Sodium: 145 mmol/L (ref 135–145)

## 2023-10-08 MED ORDER — ONDANSETRON HCL 4 MG PO TABS
4.0000 mg | ORAL_TABLET | Freq: Four times a day (QID) | ORAL | 0 refills | Status: DC | PRN
  Filled 2023-10-08: qty 20, 5d supply, fill #0

## 2023-10-08 MED ORDER — BLOOD GLUCOSE TEST VI STRP: 1.0000 | ORAL_STRIP | Freq: Three times a day (TID) | 0 refills | Status: DC

## 2023-10-08 MED ORDER — METFORMIN HCL ER 500 MG PO TB24
1000.0000 mg | ORAL_TABLET | Freq: Every evening | ORAL | 3 refills | Status: DC
  Filled 2023-10-08: qty 30, 15d supply, fill #0

## 2023-10-08 MED ORDER — INSULIN GLARGINE-YFGN 100 UNIT/ML ~~LOC~~ SOLN
45.0000 [IU] | Freq: Every day | SUBCUTANEOUS | Status: DC
  Filled 2023-10-08: qty 0.45

## 2023-10-08 MED ORDER — LANCETS MISC: 1.0000 | Freq: Three times a day (TID) | 0 refills | Status: DC

## 2023-10-08 MED ORDER — PEN NEEDLES 31G X 5 MM MISC: 1.0000 | Freq: Three times a day (TID) | 0 refills | Status: DC

## 2023-10-08 MED ORDER — INSULIN GLARGINE-YFGN 100 UNIT/ML ~~LOC~~ SOPN
40.0000 [IU] | PEN_INJECTOR | Freq: Every day | SUBCUTANEOUS | 3 refills | Status: DC
  Filled 2023-10-08: qty 12, 30d supply, fill #0

## 2023-10-08 MED ORDER — INSULIN GLARGINE-YFGN 100 UNIT/ML ~~LOC~~ SOLN: 40.0000 [IU] | Freq: Every day | SUBCUTANEOUS | Status: DC

## 2023-10-08 MED ORDER — CARVEDILOL 3.125 MG PO TABS
3.1250 mg | ORAL_TABLET | Freq: Two times a day (BID) | ORAL | 1 refills | Status: DC
  Filled 2023-10-08: qty 60, 30d supply, fill #0

## 2023-10-08 MED ORDER — INSULIN GLARGINE-YFGN 100 UNIT/ML ~~LOC~~ SOLN
45.0000 [IU] | Freq: Every day | SUBCUTANEOUS | Status: DC
  Administered 2023-10-08: 45 [IU] via SUBCUTANEOUS
  Filled 2023-10-08 (×2): qty 0.45

## 2023-10-08 MED ORDER — EMPAGLIFLOZIN 10 MG PO TABS
10.0000 mg | ORAL_TABLET | Freq: Every day | ORAL | 3 refills | Status: DC
  Filled 2023-10-08: qty 30, 30d supply, fill #0

## 2023-10-08 MED ORDER — POTASSIUM CHLORIDE CRYS ER 20 MEQ PO TBCR
40.0000 meq | EXTENDED_RELEASE_TABLET | Freq: Once | ORAL | Status: AC
  Administered 2023-10-08: 40 meq via ORAL
  Filled 2023-10-08: qty 2

## 2023-10-08 MED ORDER — LANCET DEVICE MISC: 1.0000 | Freq: Three times a day (TID) | 0 refills | Status: DC

## 2023-10-08 MED ORDER — LORAZEPAM 2 MG/ML IJ SOLN
1.0000 mg | Freq: Once | INTRAMUSCULAR | Status: AC
  Administered 2023-10-08: 1 mg via INTRAVENOUS
  Filled 2023-10-08: qty 1

## 2023-10-08 MED ORDER — PANTOPRAZOLE SODIUM 40 MG PO TBEC
40.0000 mg | DELAYED_RELEASE_TABLET | Freq: Every day | ORAL | 1 refills | Status: AC
  Filled 2023-10-08: qty 30, 30d supply, fill #0

## 2023-10-08 MED ORDER — SACUBITRIL-VALSARTAN 49-51 MG PO TABS: 1.0000 | ORAL_TABLET | Freq: Two times a day (BID) | ORAL | Status: DC

## 2023-10-08 MED ORDER — BLOOD GLUCOSE MONITORING SUPPL DEVI: 1.0000 | Freq: Three times a day (TID) | 0 refills | Status: DC

## 2023-10-08 NOTE — TOC Initial Note (Signed)
Transition of Care [facility]) - Initial/Assessment Note    Patient Details  Name: Paul [name] MRN: [id number] Date of Birth: [birth year]  Transition [facility] CM/SW Contact:    Larrie Kass, LCSW Phone Number: 10/08/2023, 12:53 PM  Clinical Narrative:                 CSW spoke with pt and pt's family, about recommendation for SNF placement. Pt's daughter is requesting SNF placement. Pt's daughter discussed pt's last SNF stay and how it helped. The family would like placement in the [place] or [place] area. Pt has [employer], CSW explained the process, CSW will send pt's information to the [place] for approval then pt's information will be sent out to approved facilities. pt inquired if he would be here throughout this process. pt will need to stay in [facility]. [facility] for SNF placement 11 am M-F.  TOC to follow.    Expected Discharge Plan: Skilled Nursing Facility Barriers to Discharge: SNF Pending bed offer   Patient Goals and CMS Choice Patient states their goals for this hospitalization and ongoing recovery are:: SNF to get stronger CMS Medicare.gov Compare Post Acute Care list provided to:: Patient Represenative (must comment) Choice offered to / list presented to : Patient, Adult Children      Expected Discharge Plan and Services       Living arrangements for the past 2 months: Apartment Expected Discharge Date: 10/08/23                                    Prior Living Arrangements/Services Living arrangements for the past 2 months: Apartment Lives with:: Self Patient language and need for interpreter reviewed:: Yes        Need for Family Participation in Patient Care: Yes (Comment) Care giver support system in place?: Yes (comment)   Criminal Activity/Legal Involvement Pertinent to Current Situation/Hospitalization: No - Comment as needed  Activities of Daily Living   ADL  Screening (condition at time of admission) Independently performs ADLs?: Yes (appropriate for developmental age) Is the patient deaf or have difficulty hearing?: No Does the patient have difficulty seeing, even when wearing glasses/contacts?: No Does the patient have difficulty concentrating, remembering, or making decisions?: No  Permission Sought/Granted                  Emotional Assessment Appearance:: Appears stated age Attitude/Demeanor/Rapport: Gracious Affect (typically observed): Accepting Orientation: : Oriented to Self, Oriented to Place, Oriented to  Time, Oriented to Situation   Psych Involvement: No (comment)  Admission diagnosis:  AKI (acute kidney injury) (HCC) [N17.9] 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 (HCC) [E11.00] Hyperosmolar hyperglycemic state (HHS) (HCC) [E11.00] Patient Active Problem List   Diagnosis Date Noted   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 (HCC) 10/07/2023   Type 2 diabetes mellitus with chronic kidney disease, with long-term current use of insulin (HCC) 03/18/2023   Community acquired bacterial pneumonia 03/16/2023   Shortness of breath 03/12/2023   AKI (acute kidney injury) (HCC) 07/10/2022   Hyperkalemia 07/10/2022   Hypokalemia 07/10/2022   DKA (diabetic ketoacidosis) (HCC) 07/09/2022   Cough 06/04/2022   Goals of care, counseling/discussion 10/23/2021   Hyperosmolar hyperglycemic state (HHS) (HCC) 06/23/2021   Hyperglycemia due to diabetes mellitus (HCC) 06/22/2021   GERD (gastroesophageal reflux  disease) 06/22/2021   Essential hypertension 06/22/2021   Hyperlipidemia 06/22/2021   Adenocarcinoma of right lung, stage 3 (HCC) 06/17/2021   Encounter for antineoplastic chemotherapy 06/17/2021   Encounter for antineoplastic immunotherapy 06/17/2021   Lung nodule 05/17/2021   Stage 3a chronic kidney disease (CKD) (HCC)  09/03/2018   CAD, multiple vessel 09/03/2018   PCP:  Clinic, [name] Pharmacy:   [facility] - [place], [place] - [address] [place] [address] Phone: [phone] Fax: [phone]  [facility] [address] [place] - [address] [place] [address] Phone: [phone] Fax: [phone]  [facility] [address] [place] [address] Phone: [phone] Fax: [phone]     Social Determinants of Health (SDOH) Social History: SDOH Screenings   Food Insecurity: No Food Insecurity (10/07/2023)  Housing: Low Risk  (10/07/2023)  Transportation Needs: No Transportation Needs (10/07/2023)  Utilities: Not At Risk (10/07/2023)  Financial Resource Strain: Low Risk  (02/03/2023)   Received from [facility]  Social Connections: Unknown (08/21/2022)   Received from [facility], [facility]  Stress: No Stress Concern Present (02/03/2023)   Received from [facility], [facility]  Tobacco Use: High Risk (10/06/2023)   SDOH Interventions:     Readmission Risk Interventions    04/08/2023    4:58 PM  Readmission Risk Prevention Plan  Transportation Screening Complete  PCP or Specialist Appt within 5-7 Days Complete  Home Care Screening Complete  Medication Review (RN CM) Complete

## 2023-10-08 NOTE — Progress Notes (Signed)
   10/08/23 1820  Complaints & Interventions  Complains of Coughing  Interventions Other (comment) (MD made aware)  Provider Notification  Provider Name/Title Dr. Isidoro Donning  Date Provider Notified 10/08/23  Time Provider Notified 1820  Method of Notification Page  Notification Reason Change in status (congested cough, throat pain)

## 2023-10-08 NOTE — Plan of Care (Signed)
  Problem: Education: Goal: Ability to describe self-care measures that may prevent or decrease complications (Diabetes Survival Skills Education) will improve Outcome: Not Applicable Goal: Individualized Educational Video(s) Outcome: Not Applicable   Problem: Cardiac: Goal: Ability to maintain an adequate cardiac output will improve Outcome: Not Applicable   Problem: Health Behavior/Discharge Planning: Goal: Ability to identify and utilize available resources and services will improve Outcome: Not Applicable Goal: Ability to manage health-related needs will improve Outcome: Not Applicable   Problem: Fluid Volume: Goal: Ability to achieve a balanced intake and output will improve Outcome: Not Applicable   Problem: Metabolic: Goal: Ability to maintain appropriate glucose levels will improve Outcome: Not Applicable   Problem: Nutritional: Goal: Maintenance of adequate nutrition will improve Outcome: Not Applicable Goal: Maintenance of adequate weight for body size and type will improve Outcome: Not Applicable   Problem: Respiratory: Goal: Will regain and/or maintain adequate ventilation Outcome: Not Applicable   Problem: Urinary Elimination: Goal: Ability to achieve and maintain adequate renal perfusion and functioning will improve Outcome: Not Applicable   Problem: Education: Goal: Ability to describe self-care measures that may prevent or decrease complications (Diabetes Survival Skills Education) will improve Outcome: Adequate for Discharge Goal: Individualized Educational Video(s) Outcome: Not Applicable   Problem: Coping: Goal: Ability to adjust to condition or change in health will improve Outcome: Adequate for Discharge   Problem: Fluid Volume: Goal: Ability to maintain a balanced intake and output will improve Outcome: Progressing   Problem: Health Behavior/Discharge Planning: Goal: Ability to identify and utilize available resources and services will  improve Outcome: Progressing Goal: Ability to manage health-related needs will improve Outcome: Progressing   Problem: Metabolic: Goal: Ability to maintain appropriate glucose levels will improve Outcome: Progressing   Problem: Nutritional: Goal: Maintenance of adequate nutrition will improve Outcome: Adequate for Discharge Goal: Progress toward achieving an optimal weight will improve Outcome: Adequate for Discharge   Problem: Skin Integrity: Goal: Risk for impaired skin integrity will decrease Outcome: Adequate for Discharge   Problem: Tissue Perfusion: Goal: Adequacy of tissue perfusion will improve Outcome: Progressing   Problem: Education: Goal: Knowledge of General Education information will improve Description: Including pain rating scale, medication(s)/side effects and non-pharmacologic comfort measures Outcome: Adequate for Discharge   Problem: Health Behavior/Discharge Planning: Goal: Ability to manage health-related needs will improve Outcome: Progressing   Problem: Clinical Measurements: Goal: Ability to maintain clinical measurements within normal limits will improve Outcome: Progressing Goal: Will remain free from infection Outcome: Progressing Goal: Diagnostic test results will improve Outcome: Adequate for Discharge Goal: Respiratory complications will improve Outcome: Progressing Goal: Cardiovascular complication will be avoided Outcome: Progressing   Problem: Activity: Goal: Risk for activity intolerance will decrease Outcome: Progressing   Problem: Nutrition: Goal: Adequate nutrition will be maintained Outcome: Adequate for Discharge   Problem: Coping: Goal: Level of anxiety will decrease Outcome: Adequate for Discharge   Problem: Elimination: Goal: Will not experience complications related to bowel motility Outcome: Adequate for Discharge Goal: Will not experience complications related to urinary retention Outcome: Adequate for  Discharge   Problem: Pain Managment: Goal: General experience of comfort will improve Outcome: Adequate for Discharge   Problem: Safety: Goal: Ability to remain free from injury will improve Outcome: Adequate for Discharge   Problem: Skin Integrity: Goal: Risk for impaired skin integrity will decrease Outcome: Adequate for Discharge

## 2023-10-08 NOTE — Inpatient Diabetes Management (Signed)
Inpatient Diabetes Program Recommendations  AACE/ADA: New Consensus Statement on Inpatient Glycemic Control (2015)  Target Ranges:  Prepandial:   less than 140 mg/dL      Peak postprandial:   less than 180 mg/dL (1-2 hours)      Critically ill patients:  140 - 180 mg/dL   Lab Results  Component Value Date   GLUCAP 118 (H) 10/08/2023   HGBA1C 12.7 (H) 10/07/2023    Review of Glycemic Control  Diabetes history: DM2 Outpatient Diabetes medications: Jardiance 10 mg daily, Semglee 40 daily, metformin 1000 mg QPM Current orders for Inpatient glycemic control: Semglee 45 every day, Novolog 0-15 TID with meals and 0-5 HS + 3 units TID  CBGs this am 118, 130 mg/dL [id number] - 04.5%  Inpatient Diabetes Program Recommendations:    Long discussion with pt late yesterday about his diabetes control and HgbA1C of 12.7%. Pt states he misses his insulin and Jardiance at home. Says he just forgets to take it. We discussed setting alarm on phone to remind him and he seemed agreeable. Only checks blood sugars 1x/day. Sister was also in the room and I asked pt if he wanted her to come over and give him the insulin/meds. He said "No" very loudly. States he will start taking insulin and meds each day. We talked about if he wanted to be independent, that he would need to be responsible and reliable about taking meds every day as prescribed. This Coordinator spoke with pt back in April 2024 about the same thing. Discussed glucose and A1C goals. Discussed importance of checking CBGs and maintaining good CBG control to prevent long-term and short-term complications. Explained how hyperglycemia leads to damage within blood vessels which lead to the common complications seen with uncontrolled diabetes. Stressed to the patient the importance of improving glycemic control to prevent further complications from uncontrolled diabetes. Discussed impact of nutrition, exercise, stress, sickness, and medications on diabetes  control.  Encouraged patient to check glucose at least 2 times per day. Pt agrees. Will need encouragement from family.  Answered all questions.   Thank you. Ailene Ards, RD, LDN, CDCES Inpatient Diabetes Coordinator [phone]

## 2023-10-08 NOTE — Progress Notes (Signed)
[facility]                                                                              Paul [name], is a 77 y.o. male, DOB - 01/18/1946, ZOX:[id number] Admit date - 10/06/2023    Outpatient Primary MD for the patient is [name]  LOS - 1  days  Chief Complaint  Patient presents with   Hyperglycemia       Brief summary    Patient is a 77 year old male with lung CA, hypothyroidism, essential hypertension, diabetes mellitus type 2, systolic CHF, EF 30 to 35% in May 2022, CAD status post CABG, COPD presented to ED with increasing weakness.  Patient reported that over the past weeks he had experienced increased thirst and frequency of urination.  Patient's sister at the bedside felt that his speech sounded slightly more slurred and confused.  Otherwise no new FND's. In ED, BP 131/104, heart rate 80, RR 24, O2 sats 96%. VBG showed pH 7.39, pCO2 55, bicarb 33.  Sodium 148, creatinine elevated at 2.7, glucose > 700  Assessment & Plan    Principal Problem:   Hyperosmolar hyperglycemic state (HHS) (HCC) in the setting of uncontrolled diabetes mellitus type 2 -hemoglobin A1c 12.7 -Has been off the insulin drip, started on subcu insulin CBG (last 3)  Recent Labs    10/08/23 0743 10/08/23 0920 10/08/23 1152  GLUCAP 118* 165* 214*   -Inpatient continue Semglee 45 units daily with sliding scale insulin -Per patient, he will not do mealtime insulin as he forgets to take the basal insulin    Active Problems:   AKI (acute kidney injury) (HCC) on CKD stage IIIa -Hold Jardiance, Entresto, metformin, -Presented with creatinine of 2.7, on admission.  Creatinine was 1.5 on 07/14/2023 -Creatinine improving to 1.65, closer to baseline  Acute metabolic encephalopathy ?  Slurred speech -Currently alert and oriented, likely in the setting of #1 -CT head showed no acute intracranial abnormality, stable atrophy  Chronic combined systolic and diastolic  CHF -2D echo 04/2021 showed EF of 30 to 35%, G1 DD -Currently appears hypovolemic, hold Jardiance, Entresto Creatinine closer to baseline, 1.6 today  Generalized debility, gait instability -PT evaluation done today, recommending SNF,  -Noted to be very deconditioned, unsteady gait -Discussed with patient's daughter, she had noted slurred speech when patient was brought to ER. -Obtain MRI of the brain, rule out CVA, any metastasis given history of lung CA  Hypokalemia -Replaced     GERD (gastroesophageal reflux disease) -Continue Protonix     Essential hypertension -BP stable, continue to hold Entresto, beta-blocker    Hyperlipidemia, CAD Continue atorvastatin  Adenocarcinoma right lung -Outpatient follow-up with Dr. Arbutus Ped   Obesity Estimated body mass index is 34.18 kg/m as calculated from the following:   Height as of 04/08/23: 5\' 8"  (1.727 m).   Weight as of 08/07/23: 102 kg.  Code Status: Full code DVT Prophylaxis:  enoxaparin (LOVENOX) injection 30 mg Start: 10/06/23 2200   Level of Care: Level of care: Telemetry Family Communication: Updated patient's daughter at the bedside Disposition Plan:      Remains inpatient appropriate:  PT evaluation  recommending SNF   Procedures:    Consultants:     Antimicrobials:   Anti-infectives (From admission, onward)    None          Medications  aspirin EC  81 mg Oral Daily   atorvastatin  80 mg Oral Daily   enoxaparin (LOVENOX) injection  30 mg Subcutaneous QHS   insulin aspart  0-15 Units Subcutaneous TID WC   insulin aspart  0-5 Units Subcutaneous QHS   insulin aspart  3 Units Subcutaneous TID WC   insulin glargine-yfgn  45 Units Subcutaneous Daily   levothyroxine  125 mcg Oral Q0600   nicotine  14 mg Transdermal Daily   pantoprazole  40 mg Oral Daily   potassium chloride  40 mEq Oral Once      Subjective:   Paul [name] was seen and examined today.  BP more stable however gait very unsteady  on PT evaluation.  CBGs improving.  Objective:   Vitals:   10/07/23 1715 10/07/23 2331 10/08/23 0331 10/08/23 1150  BP: 111/60 105/65 109/65 113/70  Pulse: 66 66 68 74  Resp: (!) 8 14  20   Temp: 97.8 F (36.6 C) 97.6 F (36.4 C) 97.7 F (36.5 C) 98.7 F (37.1 C)  TempSrc: Oral Oral Oral Oral  SpO2: 97% 99% 95% 97%    Intake/Output Summary (Last 24 hours) at 10/08/2023 1522 Last data filed at 10/08/2023 0854 Gross per 24 hour  Intake 1266.62 ml  Output --  Net 1266.62 ml     Wt Readings from Last 3 Encounters:  08/07/23 102 kg  04/14/23 106.9 kg  03/16/23 109.8 kg   Physical Exam General: Alert and oriented x 3, NAD Cardiovascular: S1 S2 clear, RRR.  Respiratory: CTAB Gastrointestinal: Soft, nontender, nondistended, NBS Ext: no pedal edema bilaterally Neuro: no new deficits, strength 5/5 in upper and lower extremities Psych: Normal affect     Data Reviewed:  I have personally reviewed following labs    CBC Lab Results  Component Value Date   WBC 8.5 10/07/2023   RBC 4.15 (L) 10/07/2023   HGB 12.6 (L) 10/07/2023   HCT 40.6 10/07/2023   MCV 97.8 10/07/2023   MCH 30.4 10/07/2023   PLT 197 10/07/2023   MCHC 31.0 10/07/2023   RDW 16.3 (H) 10/07/2023   LYMPHSABS 1.3 07/14/2023   MONOABS 0.7 07/14/2023   EOSABS 0.2 07/14/2023   BASOSABS 0.1 07/14/2023     Last metabolic panel Lab Results  Component Value Date   NA 145 10/08/2023   K 3.1 (L) 10/08/2023   CL 110 10/08/2023   CO2 27 10/08/2023   BUN 31 (H) 10/08/2023   CREATININE 1.65 (H) 10/08/2023   GLUCOSE 130 (H) 10/08/2023   GFRNONAA 42 (L) 10/08/2023   GFRAA 50 (L) 01/13/2019   CALCIUM 8.4 (L) 10/08/2023   PROT 6.9 10/07/2023   ALBUMIN 3.2 (L) 10/07/2023   BILITOT 0.7 10/07/2023   ALKPHOS 90 10/07/2023   AST 29 10/07/2023   ALT 12 10/07/2023   ANIONGAP 8 10/08/2023    CBG (last 3)  Recent Labs    10/08/23 0743 10/08/23 0920 10/08/23 1152  GLUCAP 118* 165* 214*       Coagulation Profile: No results for input(s): "INR", "PROTIME" in the last 168 hours.   Radiology Studies: I have personally reviewed the imaging studies  DG CHEST PORT 1 VIEW  Result Date: 10/06/2023 CLINICAL DATA:  Cough. EXAM: PORTABLE CHEST 1 VIEW COMPARISON:  Radiograph 04/07/2023.  PET CT 09/15/2023 reviewed  FINDINGS: Prior median sternotomy. Cardiomegaly is stable. Small right pleural effusion and right infrahilar opacity, also seen on prior PET. The left lung base opacity is difficult to accurately assessed due to soft tissue attenuation. No pneumothorax. IMPRESSION: 1. Small right pleural effusion and right infrahilar opacity, also seen on prior PET-CT. 2. Stable cardiomegaly. Electronically Signed   By: Narda Rutherford M.D.   On: 10/06/2023 18:37       Hansen Carino M.D. [facility] 10/08/2023, 3:22 PM  Available via Epic secure chat 7am-7pm After 7 pm, please refer to night coverage provider listed on amion.

## 2023-10-08 NOTE — Progress Notes (Signed)
Physical Therapy Treatment Patient Details Name: Paul [name] MRN: [id number] DOB: 1946/12/10 Today's Date: 10/08/2023   History of Present Illness Patient is a 77 year old male who presented with increased weakness with increased thirst and urination. Patient was admitted with hyperosmolar hyperglycemic state in setting of uncontrolled Dm type II, AKI, acute metabolic encephalopathy. PMH: GERD, DM II, hyperlipidemia, adenocarcinoma R lung.    PT Comments  General Comments: AxO x 2 poor ST memory.  Daughter present during session.  Pt moved to a first floor apartment with sister in the same complex/NOT same apartment.  Home alone and was driving.  Daughter who live in [place] reports apartment is not well kept.  Pt has been missing his CA appoinments and not taking his medication.  Also reports multiplre falls.  Pt was recently in hospital April and was D/C to a SNF in [place] 20 days stay then D/C's back home.  Daughter reports "my brothers girl friend is living with pt now because she does not have a place of her own.  She is NOT there all the time".  Assisted OOB required increased effort and time.  General bed mobility comments: with increased time. use of rail and HOB elevated. General transfer comment: steady assist to stand , steadied self  holding back of recliner.\ patient had wide base, unsteady.  Required increased assist to rise from lower toilet level.  Pt with posterior LOB back onto toilet during self peri care.  "My legs gave out".  No c/o dizziness this session. Noted increased dyspnea on exertion.  RA 96% and HR 87.  Pt required a seated rest break on toilet before attempting more activity. General Gait Details: VERY limited amb distance 14 feet due to weakness and increased RR.  RA 96% and HR 87 with 3/4 dyspnea.  VERY deconditioned.  VERY unsteady gait with B hips and knees flexed.  Recliner following behind for safety.  BP 3 min 122/74.  NO dizziness this session.  MAX  fatigue.  HIGH FALL RISK.  Pt will need ST Rehab at SNF to address mobility and functional decline prior to safely returning home.    If plan is discharge home, recommend the following: Two people to help with walking and/or transfers;A lot of help with bathing/dressing/bathroom;Assistance with cooking/housework;Assist for transportation;Supervision due to cognitive status   Can travel by private vehicle     No  Equipment Recommendations  Rolling walker (2 wheels)    Recommendations for Other Services       Precautions / Restrictions Precautions Precautions: Fall Precaution Comments: monitor vitals Restrictions Weight Bearing Restrictions: No     Mobility  Bed Mobility Overal bed mobility: Needs Assistance Bed Mobility: Supine to Sit     Supine to sit: Min assist     General bed mobility comments: with increased time. use of rail and HOB elevated.    Transfers Overall transfer level: Needs assistance Equipment used: Rolling walker (2 wheels) Transfers: Sit to/from Stand Sit to Stand: Min assist, Mod assist           General transfer comment: steady assist to stand , steadied self  holding back of recliner.\ patient had wide base, unsteady.  Required increased assist to rise from lower toilet level.  Pt with posterior LOB back onto toilet during self peri care.  "My legs gave out".  No c/o dizziness this session. Noted increased dyspnea on exertion.  RA 96% and HR 87.  Pt required a seated rest break on toilet before  attempting more activity.    Ambulation/Gait Ambulation/Gait assistance: Min assist, Mod assist Gait Distance (Feet): 14 Feet Assistive device: Rolling walker (2 wheels) Gait Pattern/deviations: Step-through pattern, Decreased stride length, Trunk flexed, Wide base of support Gait velocity: decreased     General Gait Details: VERY limited amb distance 14 feet due to weakness and increased RR.  RA 96% and HR 87 with 3/4 dyspnea.  VERY deconditioned.   VERY unsteady gait with B hips and knees flexed.  Recliner following behind for safety.  BP 3 min 122/74.  NO dizziness this session.  MAX fatigue.  HIGH FALL RISK.   Stairs             Wheelchair Mobility     Tilt Bed    Modified Rankin (Stroke Patients Only)       Balance                                            Cognition Arousal: Alert Behavior During Therapy: Flat affect                                   General Comments: AxO x 2 poor ST memory.  Pt moved to a first floor apartment with sister in the same complex/NOT same apartment.  Home alone and was driving.  Daughter who live in [place] reports apartment is not well kept.  Pt has been missing his CA appoinments and not taking his medication.  Also reports multiplre falls.  Pt was recently in hospital April and was D/C to a SNF in [place] 20 days stay then D/C's back home.  Daughter reports "my brothers girl friend is living with pt now because she does not have a place of her own.  She is NOT there all the time".        Exercises      General Comments        Pertinent Vitals/Pain Pain Assessment Pain Assessment: No/denies pain    Home Living                          Prior Function            PT Goals (current goals can now be found in the care plan section) Progress towards PT goals: Progressing toward goals    Frequency    Min 1X/week      PT Plan      Co-evaluation              AM-PAC PT "6 Clicks" Mobility   Outcome Measure  Help needed turning from your back to your side while in a flat bed without using bedrails?: A Lot Help needed moving from lying on your back to sitting on the side of a flat bed without using bedrails?: A Lot Help needed moving to and from a bed to a chair (including a wheelchair)?: A Lot Help needed standing up from a chair using your arms (e.g., wheelchair or bedside chair)?: A Lot Help needed to  walk in hospital room?: A Lot Help needed climbing 3-5 steps with a railing? : Total 6 Click Score: 11    End of Session Equipment Utilized During Treatment: Gait belt Activity Tolerance: Patient limited by fatigue Patient left: in chair;with call bell/phone  within reach;with family/visitor present Nurse Communication: Mobility status PT Visit Diagnosis: Unsteadiness on feet (R26.81);History of falling (Z91.81);Difficulty in walking, not elsewhere classified (R26.2)     Time: 9528-4132 PT Time Calculation (min) (ACUTE ONLY): 30 min  Charges:    $Gait Training: 8-22 mins $Therapeutic Activity: 8-22 mins PT General Charges $$ ACUTE PT VISIT: 1 Visit                     Felecia Shelling  PTA Acute  Rehabilitation Services Office M-F          [phone]

## 2023-10-09 ENCOUNTER — Other Ambulatory Visit (HOSPITAL_COMMUNITY): Payer: Self-pay

## 2023-10-09 DIAGNOSIS — I10 Essential (primary) hypertension: Secondary | ICD-10-CM | POA: Diagnosis not present

## 2023-10-09 DIAGNOSIS — N179 Acute kidney failure, unspecified: Secondary | ICD-10-CM | POA: Diagnosis not present

## 2023-10-09 DIAGNOSIS — I7389 Other specified peripheral vascular diseases: Secondary | ICD-10-CM | POA: Diagnosis not present

## 2023-10-09 DIAGNOSIS — E1100 Type 2 diabetes mellitus with hyperosmolarity without nonketotic hyperglycemic-hyperosmolar coma (NKHHC): Secondary | ICD-10-CM | POA: Diagnosis not present

## 2023-10-09 LAB — BASIC METABOLIC PANEL
Anion gap: 10 (ref 5–15)
BUN: 26 mg/dL — ABNORMAL HIGH (ref 8–23)
CO2: 26 mmol/L (ref 22–32)
Calcium: 8.8 mg/dL — ABNORMAL LOW (ref 8.9–10.3)
Chloride: 110 mmol/L (ref 98–111)
Creatinine, Ser: 1.55 mg/dL — ABNORMAL HIGH (ref 0.61–1.24)
GFR, Estimated: 45 mL/min — ABNORMAL LOW (ref >60)
Glucose, Bld: 53 mg/dL — ABNORMAL LOW (ref 70–99)
Potassium: 3.4 mmol/L — ABNORMAL LOW (ref 3.5–5.1)
Sodium: 146 mmol/L — ABNORMAL HIGH (ref 135–145)

## 2023-10-09 LAB — GLUCOSE, CAPILLARY
Glucose-Capillary: 50 mg/dL — ABNORMAL LOW (ref 70–99)
Glucose-Capillary: 52 mg/dL — ABNORMAL LOW (ref 70–99)
Glucose-Capillary: 195 mg/dL — ABNORMAL HIGH (ref 70–99)
Glucose-Capillary: 233 mg/dL — ABNORMAL HIGH (ref 70–99)
Glucose-Capillary: 279 mg/dL — ABNORMAL HIGH (ref 70–99)
Glucose-Capillary: 240 mg/dL — ABNORMAL HIGH (ref 70–99)
Glucose-Capillary: 234 mg/dL — ABNORMAL HIGH (ref 70–99)

## 2023-10-09 LAB — CBC
HCT: 39.5 % (ref 39.0–52.0)
Hemoglobin: 12.4 g/dL — ABNORMAL LOW (ref 13.0–17.0)
MCH: 30.3 pg (ref 26.0–34.0)
MCHC: 31.4 g/dL (ref 30.0–36.0)
MCV: 96.6 fL (ref 80.0–100.0)
Platelets: 200 10*3/uL (ref 150–400)
RBC: 4.09 MIL/uL — ABNORMAL LOW (ref 4.22–5.81)
RDW: 16.1 % — ABNORMAL HIGH (ref 11.5–15.5)
WBC: 7.5 10*3/uL (ref 4.0–10.5)
nRBC: 0 % (ref 0.0–0.2)

## 2023-10-09 LAB — PROCALCITONIN: Procalcitonin: 0.23 ng/mL

## 2023-10-09 MED ORDER — INSULIN GLARGINE-YFGN 100 UNIT/ML ~~LOC~~ SOLN
35.0000 [IU] | Freq: Every day | SUBCUTANEOUS | Status: DC
  Administered 2023-10-09 – 2023-10-11 (×3): 35 [IU] via SUBCUTANEOUS
  Filled 2023-10-09 (×3): qty 0.35

## 2023-10-09 MED ORDER — INSULIN ASPART 100 UNIT/ML IJ SOLN
0.0000 [IU] | Freq: Three times a day (TID) | INTRAMUSCULAR | Status: DC
  Administered 2023-10-09: 3 [IU] via SUBCUTANEOUS
  Administered 2023-10-10 (×2): 2 [IU] via SUBCUTANEOUS
  Administered 2023-10-11: 1 [IU] via SUBCUTANEOUS
  Administered 2023-10-11: 3 [IU] via SUBCUTANEOUS

## 2023-10-09 MED ORDER — POTASSIUM CHLORIDE CRYS ER 20 MEQ PO TBCR
40.0000 meq | EXTENDED_RELEASE_TABLET | Freq: Once | ORAL | Status: AC
  Administered 2023-10-09: 40 meq via ORAL
  Filled 2023-10-09: qty 2
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
## 2023-10-09 NOTE — TOC Progression Note (Addendum)
Transition of Care [facility]) - Progression Note    Patient Details  Name: Paul [name] MRN: [id number] Date of Birth: 04-Feb-1946  [facility] of [facility]) CM/SW Contact  Larrie Kass, LCSW Phone Number: 10/09/2023, 10:33 AM  Clinical Narrative:     CSW has faxed pt's clinicals to the [place] for review. TOC to follow.   ADDEN 2:30pm CSW received VM from [place] coordinator requesting additional Clinicals and more information on pt's living situation. CSW spoke with pt's daughter , she reported pt was staying with her brother but is now living alone. CSW provided updated information to [facility] coordinator. TOC to follow.    Expected Discharge Plan: Skilled Nursing Facility Barriers to Discharge: SNF Pending bed offer  Expected Discharge Plan and Services       Living arrangements for the past 2 months: Apartment Expected Discharge Date: 10/08/23                                     Social Determinants of Health (SDOH) Interventions SDOH Screenings   Food Insecurity: No Food Insecurity (10/07/2023)  Housing: Low Risk  (10/07/2023)  Transportation Needs: No Transportation Needs (10/07/2023)  Utilities: Not At Risk (10/07/2023)  Financial Resource Strain: Low Risk  (02/03/2023)   Received from [facility], [facility]  Social Connections: Unknown (08/21/2022)   Received from [facility], [facility]  Stress: No Stress Concern Present (02/03/2023)   Received from [facility], [facility]  Tobacco Use: High Risk (10/06/2023)    Readmission Risk Interventions    04/08/2023    4:58 PM  Readmission Risk Prevention Plan  Transportation Screening Complete  PCP or Specialist Appt within 5-7 Days Complete  Home Care Screening Complete  Medication Review (RN CM) Complete

## 2023-10-09 NOTE — Plan of Care (Signed)
  Problem: Activity: Goal: Risk for activity intolerance will decrease Outcome: Progressing   Problem: Nutrition: Goal: Adequate nutrition will be maintained Outcome: Progressing   Problem: Safety: Goal: Ability to remain free from injury will improve Outcome: Progressing   Problem: Skin Integrity: Goal: Risk for impaired skin integrity will decrease Outcome: Progressing   

## 2023-10-09 NOTE — Progress Notes (Signed)
[facility]                                                                              [name], is a 77 y.o. male, DOB - [birth year], QMV:[id number] Admit date - 10/06/2023    Outpatient Primary MD for the patient is [name]  LOS - 2  days  Chief Complaint  Patient presents with   Hyperglycemia       Brief summary    Patient is a 77 year old male with lung CA, hypothyroidism, essential hypertension, diabetes mellitus type 2, systolic CHF, EF 30 to 35% in May 2022, CAD status post CABG, COPD presented to ED with increasing weakness.  Patient reported that over the past weeks he had experienced increased thirst and frequency of urination.  Patient's sister at the bedside felt that his speech sounded slightly more slurred and confused.  Otherwise no new FND's. In ED, BP 131/104, heart rate 80, RR 24, O2 sats 96%. VBG showed pH 7.39, pCO2 55, bicarb 33.  Sodium 148, creatinine elevated at 2.7, glucose > 700  Assessment & Plan    Principal Problem:   Hyperosmolar hyperglycemic state (HHS) (HCC) in the setting of uncontrolled diabetes mellitus type 2 -hemoglobin A1c 12.7 -Has been off the insulin drip, started on subcu insulin CBG (last 3)  Recent Labs    10/09/23 0832 10/09/23 1139 10/09/23 1255  GLUCAP 195* 233* 279*   Earlier this morning, was noted to be hypoglycemic with CBGs in 50s, decreased Semglee to 35 units -Continue sliding scale insulin -Plan to continue insulin regimen if going to SNF   Active Problems:   AKI (acute kidney injury) (HCC) on CKD stage IIIa -Hold Jardiance, Entresto, metformin, -Presented with creatinine of 2.7, on admission.  Creatinine was 1.5 on 07/14/2023 -Creatinine to 1.5, at baseline  Acute metabolic encephalopathy ?  Slurred speech -Currently alert and oriented, likely in the setting of #1 -CT head showed no acute intracranial abnormality, stable atrophy -MRI brain negative for acute stroke -Had  some chest congestion with concern for for aspiration.  Chest x-ray showed mild atelectasis or infiltrates at the lung bases -SLP evaluation done, placed on dysphagia 3 diet  Chronic combined systolic and diastolic CHF -2D echo 04/2021 showed EF of 30 to 35%, G1 DD -Currently appears hypovolemic, hold Jardiance, Entresto.  Not on diuretics outpatient Creatinine closer to baseline, 1.6 today  Generalized debility, gait instability -PT evaluation recommended SNF.  MRI brain negative for acute CVA or metastasis  Hypokalemia -Replace as needed     GERD (gastroesophageal reflux disease) -Continue Protonix     Essential hypertension -BP stable, continue to hold Entresto, beta-blocker    Hyperlipidemia, CAD Continue atorvastatin  Adenocarcinoma right lung -Outpatient follow-up with Dr. Arbutus Ped   Obesity Estimated body mass index is 34.18 kg/m as calculated from the following:   Height as of 04/08/23: 5\' 8"  (1.727 m).   Weight as of 08/07/23: 102 kg.  Code Status: Full code DVT Prophylaxis:  enoxaparin (LOVENOX) injection 30 mg Start: 10/06/23 2200   Level of Care: Level of care: Telemetry Family Communication: Updated patient's daughter at the bedside today  Disposition Plan:      Remains inpatient appropriate: Awaiting SNF   Procedures:    Consultants:     Antimicrobials:   Anti-infectives (From admission, onward)    None          Medications  aspirin EC  81 mg Oral Daily   atorvastatin  80 mg Oral Daily   enoxaparin (LOVENOX) injection  30 mg Subcutaneous QHS   insulin aspart  0-5 Units Subcutaneous QHS   insulin aspart  0-9 Units Subcutaneous TID WC   insulin glargine-yfgn  35 Units Subcutaneous Daily   levothyroxine  125 mcg Oral Q0600   nicotine  14 mg Transdermal Daily   pantoprazole  40 mg Oral Daily      Subjective:   Paul [name] was seen and examined today.  No acute complaints, overnight had some coughing with food.  This morning noted  to be hypoglycemic with CBG in 50s.  No chest, fever chills or acute shortness of breath.   Objective:   Vitals:   10/08/23 2233 10/09/23 0435 10/09/23 0755 10/09/23 1258  BP: (!) 123/92 (!) 127/97  130/71  Pulse: 78 79  85  Resp: 18 18 20 19   Temp: 98.9 F (37.2 C) 98 F (36.7 C)  98.7 F (37.1 C)  TempSrc: Oral Oral  Oral  SpO2: 96% 97%  100%    Intake/Output Summary (Last 24 hours) at 10/09/2023 1457 Last data filed at 10/09/2023 0746 Gross per 24 hour  Intake 360 ml  Output 800 ml  Net -440 ml     Wt Readings from Last 3 Encounters:  08/07/23 102 kg  04/14/23 106.9 kg  03/16/23 109.8 kg    Physical Exam General: Alert and oriented x 3, NAD Cardiovascular: S1 S2 clear, RRR.  Respiratory: Decreased breath sound at the bases, no wheezing Gastrointestinal: Soft, nontender, nondistended, NBS Ext: no pedal edema bilaterally Neuro: no new deficits Psych: Normal affect    Data Reviewed:  I have personally reviewed following labs    CBC Lab Results  Component Value Date   WBC 7.5 10/09/2023   RBC 4.09 (L) 10/09/2023   HGB 12.4 (L) 10/09/2023   HCT 39.5 10/09/2023   MCV 96.6 10/09/2023   MCH 30.3 10/09/2023   PLT 200 10/09/2023   MCHC 31.4 10/09/2023   RDW 16.1 (H) 10/09/2023   LYMPHSABS 1.3 07/14/2023   MONOABS 0.7 07/14/2023   EOSABS 0.2 07/14/2023   BASOSABS 0.1 07/14/2023     Last metabolic panel Lab Results  Component Value Date   NA 146 (H) 10/09/2023   K 3.4 (L) 10/09/2023   CL 110 10/09/2023   CO2 26 10/09/2023   BUN 26 (H) 10/09/2023   CREATININE 1.55 (H) 10/09/2023   GLUCOSE 53 (L) 10/09/2023   GFRNONAA 45 (L) 10/09/2023   GFRAA 50 (L) 01/13/2019   CALCIUM 8.8 (L) 10/09/2023   PROT 6.9 10/07/2023   ALBUMIN 3.2 (L) 10/07/2023   BILITOT 0.7 10/07/2023   ALKPHOS 90 10/07/2023   AST 29 10/07/2023   ALT 12 10/07/2023   ANIONGAP 10 10/09/2023    CBG (last 3)  Recent Labs    10/09/23 0832 10/09/23 1139 10/09/23 1255  GLUCAP  195* 233* 279*      Coagulation Profile: No results for input(s): "INR", "PROTIME" in the last 168 hours.   Radiology Studies: I have personally reviewed the imaging studies  MR BRAIN WO CONTRAST  Result Date: 10/09/2023 CLINICAL DATA:  Initial evaluation for neuro deficit, stroke suspected.  EXAM: MRI HEAD WITHOUT CONTRAST TECHNIQUE: Multiplanar, multiecho pulse sequences of the brain and surrounding structures were obtained without intravenous contrast. COMPARISON:  Prior study from 10/06/2023. FINDINGS: Brain: Mild age-related cerebral atrophy. Mild hazy T2/FLAIR hyperintensity involving the periventricular deep white matter, most characteristic of chronic microvascular ischemic disease, mild for age. No evidence for acute or subacute ischemia. Gray-white matter differentiation maintained. No areas of chronic cortical infarction. No acute or chronic intracranial blood products. No mass lesion, midline shift or mass effect. No hydrocephalus or extra-axial fluid collection. Pituitary gland and suprasellar region within normal limits. Vascular: Major intracranial vascular flow voids are maintained. Skull and upper cervical spine: Craniocervical junction within normal limits. Bone marrow signal intensity normal. Degenerative spondylosis within the visualized upper cervical spine without high-grade spinal stenosis. No scalp soft tissue abnormality. Sinuses/Orbits: Globes and orbital soft tissues within normal limits. Scattered mucosal thickening present about the ethmoidal air cells and maxillary sinuses. Paranasal sinuses are otherwise clear. No significant mastoid effusion. Other: None. IMPRESSION: 1. No acute intracranial abnormality. 2. Mild age-related cerebral atrophy with chronic small vessel ischemic disease. Electronically Signed   By: Rise Mu M.D.   On: 10/09/2023 01:38   DG Chest Port 1 View  Result Date: 10/08/2023 CLINICAL DATA:  Congestion of respiratory tract, hyperglycemia.  EXAM: PORTABLE CHEST 1 VIEW COMPARISON:  10/06/2023, 09/15/2023. FINDINGS: The heart size and mediastinal contours are stable. Lung volumes are low with patchy airspace disease at the lung bases. There is a small right pleural effusion. No pneumothorax. Sternotomy wires are present over the midline. No acute osseous abnormality is seen. IMPRESSION: 1. Mild atelectasis or infiltrate at the lung bases and concern for recurrent tumor in right lower lobe per recent PET-CT. 2. Small right pleural effusion. 3. Cardiomegaly. Electronically Signed   By: Thornell Sartorius M.D.   On: 10/08/2023 20:30       Kamalani Mastro M.D. [facility] 10/09/2023, 2:57 PM  Available via Epic secure chat 7am-7pm After 7 pm, please refer to night coverage provider listed on amion.

## 2023-10-09 NOTE — Plan of Care (Signed)
  Problem: Tissue Perfusion: Goal: Adequacy of tissue perfusion will improve Outcome: Progressing   Problem: Clinical Measurements: Goal: Cardiovascular complication will be avoided Outcome: Progressing   Problem: Coping: Goal: Level of anxiety will decrease Outcome: Progressing   Problem: Elimination: Goal: Will not experience complications related to bowel motility Outcome: Progressing Goal: Will not experience complications related to urinary retention Outcome: Progressing

## 2023-10-10 DIAGNOSIS — E1100 Type 2 diabetes mellitus with hyperosmolarity without nonketotic hyperglycemic-hyperosmolar coma (NKHHC): Secondary | ICD-10-CM | POA: Diagnosis not present

## 2023-10-10 DIAGNOSIS — N179 Acute kidney failure, unspecified: Secondary | ICD-10-CM | POA: Diagnosis not present

## 2023-10-10 DIAGNOSIS — I10 Essential (primary) hypertension: Secondary | ICD-10-CM | POA: Diagnosis not present

## 2023-10-10 LAB — BASIC METABOLIC PANEL
Anion gap: 8 (ref 5–15)
BUN: 20 mg/dL (ref 8–23)
CO2: 25 mmol/L (ref 22–32)
Calcium: 8.4 mg/dL — ABNORMAL LOW (ref 8.9–10.3)
Chloride: 111 mmol/L (ref 98–111)
Creatinine, Ser: 1.45 mg/dL — ABNORMAL HIGH (ref 0.61–1.24)
GFR, Estimated: 49 mL/min — ABNORMAL LOW (ref >60)
Glucose, Bld: 115 mg/dL — ABNORMAL HIGH (ref 70–99)
Potassium: 3.8 mmol/L (ref 3.5–5.1)
Sodium: 144 mmol/L (ref 135–145)

## 2023-10-10 LAB — GLUCOSE, CAPILLARY
Glucose-Capillary: 98 mg/dL (ref 70–99)
Glucose-Capillary: 159 mg/dL — ABNORMAL HIGH (ref 70–99)
Glucose-Capillary: 187 mg/dL — ABNORMAL HIGH (ref 70–99)
Glucose-Capillary: 181 mg/dL — ABNORMAL HIGH (ref 70–99)

## 2023-10-10 MED ORDER — GUAIFENESIN-DM 100-10 MG/5ML PO SYRP
5.0000 mL | ORAL_SOLUTION | ORAL | Status: DC | PRN
  Administered 2023-10-10 – 2023-10-12 (×4): 5 mL via ORAL
  Filled 2023-10-10 (×4): qty 10

## 2023-10-10 NOTE — Progress Notes (Signed)
Pt's family had questions about inpatient rehab status. TOC team had informed pt that someone would be by to talk to them today. Secure chat sent to [facility] team and primary nurse.

## 2023-10-10 NOTE — Progress Notes (Signed)
This RN updated pt & family that social would call her in a little bit regarding placement. Pt's sister is going back to [facility].

## 2023-10-10 NOTE — Progress Notes (Signed)
[facility]                                                                              Paul [name], is a 77 y.o. male, DOB - 02-20-1946, ZOX:[id number] Admit date - 10/06/2023    Outpatient Primary MD for the patient is [name]  LOS - 3  days  Chief Complaint  Patient presents with   Hyperglycemia       Brief summary    Patient is a 77 year old male with lung CA, hypothyroidism, essential hypertension, diabetes mellitus type 2, systolic CHF, EF 30 to 35% in May 2022, CAD status post CABG, COPD presented to ED with increasing weakness.  Patient reported that over the past weeks he had experienced increased thirst and frequency of urination.  Patient's sister at the bedside felt that his speech sounded slightly more slurred and confused.  Otherwise no new FND's. In ED, BP 131/104, heart rate 80, RR 24, O2 sats 96%. VBG showed pH 7.39, pCO2 55, bicarb 33.  Sodium 148, creatinine elevated at 2.7, glucose > 700  Assessment & Plan    Principal Problem:   Hyperosmolar hyperglycemic state (HHS) (HCC) in the setting of uncontrolled DM type 2, IDDM -hemoglobin A1c 12.7 -Has been off the insulin drip, started on subcu insulin CBG (last 3)  Recent Labs    10/09/23 1644 10/09/23 2113 10/10/23 0737  GLUCAP 240* 234* 98   -CBGs improving, for now continue sensitive sliding scale insulin, Semglee had been decreased to 235 units daily -Plan to continue insulin regimen if going to SNF   Active Problems:   AKI (acute kidney injury) (HCC) on CKD stage IIIa -Hold Jardiance, Entresto, metformin, -Presented with creatinine of 2.7, on admission.  Creatinine was 1.5 on 07/14/2023 -Creatinine continues to improve, 1.4  Acute metabolic encephalopathy ?  Slurred speech - likely in the setting of #1 -CT head showed no acute intracranial abnormality, stable atrophy -MRI brain negative for acute stroke -Chest x-ray showed mild atelectasis or infiltrates at  the lung bases, no leukocytosis. -SLP evaluation done, placed on dysphagia 3 diet  Chronic combined systolic and diastolic CHF -2D echo 04/2021 showed EF of 30 to 35%, G1 DD - hold Jardiance, Entresto.  Does not appear to be on scheduled diuretics outpatient Creatinine at baseline, chest x-ray on 10/10 had shown cardiomegaly with small right-sided pleural effusion.  O2 sats 100% on room air  Generalized debility, gait instability -PT evaluation recommended SNF.  MRI brain negative for acute CVA or metastasis  Hypokalemia -Replace as needed     GERD (gastroesophageal reflux disease) -Continue Protonix     Essential hypertension -BP is stable, currently Entresto, beta-blocker held     Hyperlipidemia, CAD Continue atorvastatin  Adenocarcinoma right lung -Outpatient follow-up with Dr. Arbutus Ped   Obesity Estimated body mass index is 34.96 kg/m as calculated from the following:   Height as of 04/08/23: 5\' 8"  (1.727 m).   Weight as of this encounter: 104.3 kg.  Code Status: Full code DVT Prophylaxis:  enoxaparin (LOVENOX) injection 30 mg Start: 10/06/23 2200   Level of Care: Level of care: Telemetry Family Communication: Updated  patient's daughter at the bedside on 10/11 Disposition Plan:      Remains inpatient appropriate: Awaiting SNF   Procedures:    Consultants:     Antimicrobials:   Anti-infectives (From admission, onward)    None          Medications  aspirin EC  81 mg Oral Daily   atorvastatin  80 mg Oral Daily   enoxaparin (LOVENOX) injection  30 mg Subcutaneous QHS   insulin aspart  0-5 Units Subcutaneous QHS   insulin aspart  0-9 Units Subcutaneous TID WC   insulin glargine-yfgn  35 Units Subcutaneous Daily   levothyroxine  125 mcg Oral Q0600   nicotine  14 mg Transdermal Daily   pantoprazole  40 mg Oral Daily      Subjective:   Sohrab [name] was seen and examined today.  No acute complaints, disappointed that he has to go to SNF.  No  acute chest pain, shortness of breath, fevers or chills.  Objective:   Vitals:   10/09/23 2011 10/10/23 0451 10/10/23 0500 10/10/23 0739  BP: 129/79 (!) 134/94    Pulse: 84 82    Resp: 18 18  20   Temp: 98.3 F (36.8 C) 98.4 F (36.9 C)    TempSrc: Oral Oral    SpO2: 100% 100%    Weight:   104.3 kg     Intake/Output Summary (Last 24 hours) at 10/10/2023 1056 Last data filed at 10/10/2023 0500 Gross per 24 hour  Intake 600 ml  Output 1000 ml  Net -400 ml     Wt Readings from Last 3 Encounters:  10/10/23 104.3 kg  08/07/23 102 kg  04/14/23 106.9 kg   Physical Exam General: Alert and oriented x 3, NAD Cardiovascular: S1 S2 clear, RRR.  Respiratory: Diminished breath sound at the bases, no wheezing Gastrointestinal: Soft, nontender, nondistended, NBS Ext: no pedal edema bilaterally Neuro: no new deficits Psych: Normal affect   Data Reviewed:  I have personally reviewed following labs    CBC Lab Results  Component Value Date   WBC 7.5 10/09/2023   RBC 4.09 (L) 10/09/2023   HGB 12.4 (L) 10/09/2023   HCT 39.5 10/09/2023   MCV 96.6 10/09/2023   MCH 30.3 10/09/2023   PLT 200 10/09/2023   MCHC 31.4 10/09/2023   RDW 16.1 (H) 10/09/2023   LYMPHSABS 1.3 07/14/2023   MONOABS 0.7 07/14/2023   EOSABS 0.2 07/14/2023   BASOSABS 0.1 07/14/2023     Last metabolic panel Lab Results  Component Value Date   NA 144 10/10/2023   K 3.8 10/10/2023   CL 111 10/10/2023   CO2 25 10/10/2023   BUN 20 10/10/2023   CREATININE 1.45 (H) 10/10/2023   GLUCOSE 115 (H) 10/10/2023   GFRNONAA 49 (L) 10/10/2023   GFRAA 50 (L) 01/13/2019   CALCIUM 8.4 (L) 10/10/2023   PROT 6.9 10/07/2023   ALBUMIN 3.2 (L) 10/07/2023   BILITOT 0.7 10/07/2023   ALKPHOS 90 10/07/2023   AST 29 10/07/2023   ALT 12 10/07/2023   ANIONGAP 8 10/10/2023    CBG (last 3)  Recent Labs    10/09/23 1644 10/09/23 2113 10/10/23 0737  GLUCAP 240* 234* 98      Coagulation Profile: No results for  input(s): "INR", "PROTIME" in the last 168 hours.   Radiology Studies: I have personally reviewed the imaging studies  MR BRAIN WO CONTRAST  Result Date: 10/09/2023 CLINICAL DATA:  Initial evaluation for neuro deficit, stroke suspected. EXAM: MRI HEAD WITHOUT  CONTRAST TECHNIQUE: Multiplanar, multiecho pulse sequences of the brain and surrounding structures were obtained without intravenous contrast. COMPARISON:  Prior study from 10/06/2023. FINDINGS: Brain: Mild age-related cerebral atrophy. Mild hazy T2/FLAIR hyperintensity involving the periventricular deep white matter, most characteristic of chronic microvascular ischemic disease, mild for age. No evidence for acute or subacute ischemia. Gray-white matter differentiation maintained. No areas of chronic cortical infarction. No acute or chronic intracranial blood products. No mass lesion, midline shift or mass effect. No hydrocephalus or extra-axial fluid collection. Pituitary gland and suprasellar region within normal limits. Vascular: Major intracranial vascular flow voids are maintained. Skull and upper cervical spine: Craniocervical junction within normal limits. Bone marrow signal intensity normal. Degenerative spondylosis within the visualized upper cervical spine without high-grade spinal stenosis. No scalp soft tissue abnormality. Sinuses/Orbits: Globes and orbital soft tissues within normal limits. Scattered mucosal thickening present about the ethmoidal air cells and maxillary sinuses. Paranasal sinuses are otherwise clear. No significant mastoid effusion. Other: None. IMPRESSION: 1. No acute intracranial abnormality. 2. Mild age-related cerebral atrophy with chronic small vessel ischemic disease. Electronically Signed   By: Rise Mu M.D.   On: 10/09/2023 01:38   DG Chest Port 1 View  Result Date: 10/08/2023 CLINICAL DATA:  Congestion of respiratory tract, hyperglycemia. EXAM: PORTABLE CHEST 1 VIEW COMPARISON:  10/06/2023,  09/15/2023. FINDINGS: The heart size and mediastinal contours are stable. Lung volumes are low with patchy airspace disease at the lung bases. There is a small right pleural effusion. No pneumothorax. Sternotomy wires are present over the midline. No acute osseous abnormality is seen. IMPRESSION: 1. Mild atelectasis or infiltrate at the lung bases and concern for recurrent tumor in right lower lobe per recent PET-CT. 2. Small right pleural effusion. 3. Cardiomegaly. Electronically Signed   By: Thornell Sartorius M.D.   On: 10/08/2023 20:30       Joliyah Lippens M.D. [facility] 10/10/2023, 10:56 AM  Available via Epic secure chat 7am-7pm After 7 pm, please refer to night coverage provider listed on amion.

## 2023-10-10 NOTE — Progress Notes (Signed)
Mobility Specialist - Progress Note   10/10/23 1337  Mobility  Activity Ambulated with assistance in hallway  Level of Assistance Contact guard assist, steadying assist  Assistive Device Front wheel walker  Distance Ambulated (ft) 50 ft  Range of Motion/Exercises Active  Activity Response Tolerated well  Mobility Referral Yes  $Mobility charge 1 Mobility  Mobility Specialist Start Time (ACUTE ONLY) 1327  Mobility Specialist Stop Time (ACUTE ONLY) 1337  Mobility Specialist Time Calculation (min) (ACUTE ONLY) 10 min   Pt was found in bed and agreeable to ambulate. Pt had no complaints with session. Pt returned to recliner chair with all needs met. Call bell in reach and family in room.   Billey Chang Mobility Specialist

## 2023-10-10 NOTE — NC FL2 (Signed)
[place] MEDICAID FL2 LEVEL OF CARE FORM     IDENTIFICATION  Patient Name: Paul [name] Birthdate: 06-Oct-1946 Sex: male Admission Date (Current Location): 10/06/2023  [facility] and [place] Number:  Producer, television/film/video and Address:  [facility],  [address] [place], [place] [address]      Provider Number: [id number]  Attending Physician Name and Address:  Cathren Harsh, MD  Relative Name and Phone Number:  [facility] Daughter [phone]  [phone]  [facility] Sister   [phone]    Current Level of Care: Hospital Recommended Level of Care: Skilled Nursing Facility Prior Approval Number:    Date Approved/Denied:   PASRR Number: [id number]  Discharge Plan: SNF    Current Diagnoses: Patient Active Problem List   Diagnosis Date Noted   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 (HCC) 10/07/2023   Type 2 diabetes mellitus with chronic kidney disease, with long-term current use of insulin (HCC) 03/18/2023   Community acquired bacterial pneumonia 03/16/2023   Shortness of breath 03/12/2023   AKI (acute kidney injury) (HCC) 07/10/2022   Hyperkalemia 07/10/2022   Hypokalemia 07/10/2022   DKA (diabetic ketoacidosis) (HCC) 07/09/2022   Cough 06/04/2022   Goals of care, counseling/discussion 10/23/2021   Hyperosmolar hyperglycemic state (HHS) (HCC) 06/23/2021   Hyperglycemia due to diabetes mellitus (HCC) 06/22/2021   GERD (gastroesophageal reflux disease) 06/22/2021   Essential hypertension 06/22/2021   Hyperlipidemia 06/22/2021   Adenocarcinoma of right lung, stage 3 (HCC) 06/17/2021   Encounter for antineoplastic chemotherapy 06/17/2021   Encounter for antineoplastic immunotherapy 06/17/2021   Lung nodule 05/17/2021   Stage 3a chronic kidney disease (CKD) (HCC) 09/03/2018   CAD, multiple vessel 09/03/2018    Orientation RESPIRATION BLADDER Height & Weight     Self, Time, Situation, Place  Normal Incontinent Weight:  229 lb 15 oz (104.3 kg) Height:     BEHAVIORAL SYMPTOMS/MOOD NEUROLOGICAL BOWEL NUTRITION STATUS      Continent Diet  AMBULATORY STATUS COMMUNICATION OF NEEDS Skin   Limited Assist Verbally Normal                       Personal Care Assistance Level of Assistance  Bathing, Feeding, Dressing Bathing Assistance: Limited assistance Feeding assistance: Independent Dressing Assistance: Limited assistance     Functional Limitations Info  Sight, Hearing, Speech Sight Info: Adequate Hearing Info: Adequate Speech Info: Adequate    SPECIAL CARE FACTORS FREQUENCY  PT (By licensed PT), OT (By licensed OT)     PT Frequency: Minimum 5x a week OT Frequency: Minimum 5x a week            Contractures Contractures Info: Not present    Additional Factors Info  Code Status, Allergies, Insulin Sliding Scale Code Status Info: Full Code Allergies Info: NKA   Insulin Sliding Scale Info: insulin aspart (novoLOG) injection 0-9 Units 3x a day with meals.       Current Medications (10/10/2023):  This is the current hospital active medication list Current Facility-Administered Medications  Medication Dose Route Frequency Provider Last Rate Last Admin   acetaminophen (TYLENOL) tablet 650 mg  650 mg Oral Q6H PRN Foust, Katy L, NP       Or   acetaminophen (TYLENOL) suppository 650 mg  650 mg Rectal Q6H PRN Foust, Katy L, NP       albuterol (PROVENTIL) (2.5 MG/3ML) 0.083% nebulizer solution 2.5 mg  2.5 mg Nebulization Q4H PRN Foust, Katy L, NP       aspirin  EC tablet 81 mg  81 mg Oral Daily Foust, Katy L, NP   81 mg at 10/10/23 1021   atorvastatin (LIPITOR) tablet 80 mg  80 mg Oral Daily Foust, Katy L, NP   80 mg at 10/10/23 1021   dextrose 50 % solution 0-50 mL  0-50 mL Intravenous PRN Derwood Kaplan, MD   50 mL at 10/09/23 0806   enoxaparin (LOVENOX) injection 30 mg  30 mg Subcutaneous QHS Foust, Katy L, NP   30 mg at 10/09/23 2103   guaiFENesin-dextromethorphan (ROBITUSSIN DM) 100-10  MG/5ML syrup 5 mL  5 mL Oral Q4H PRN Rai, Ripudeep K, MD   5 mL at 10/10/23 1642   insulin aspart (novoLOG) injection 0-5 Units  0-5 Units Subcutaneous QHS Rai, Ripudeep K, MD   2 Units at 10/09/23 2121   insulin aspart (novoLOG) injection 0-9 Units  0-9 Units Subcutaneous TID WC Rai, Ripudeep K, MD   2 Units at 10/10/23 1643   insulin glargine-yfgn (SEMGLEE) injection 35 Units  35 Units Subcutaneous Daily Rai, Ripudeep K, MD   35 Units at 10/10/23 1100   levothyroxine (SYNTHROID) tablet 125 mcg  125 mcg Oral Q0600 Foust, Katy L, NP   125 mcg at 10/10/23 0504   melatonin tablet 5 mg  5 mg Oral QHS PRN Foust, Katy L, NP       nicotine (NICODERM CQ - dosed in mg/24 hours) patch 14 mg  14 mg Transdermal Daily Foust, Katy L, NP   14 mg at 10/10/23 1020   nicotine polacrilex (NICORETTE) gum 2 mg  2 mg Oral PRN Foust, Katy L, NP       ondansetron (ZOFRAN) tablet 4 mg  4 mg Oral Q6H PRN Foust, Katy L, NP       Or   ondansetron (ZOFRAN) injection 4 mg  4 mg Intravenous Q6H PRN Foust, Katy L, NP       pantoprazole (PROTONIX) EC tablet 40 mg  40 mg Oral Daily Foust, Katy L, NP   40 mg at 10/10/23 1021   polyethylene glycol (MIRALAX / GLYCOLAX) packet 17 g  17 g Oral Daily PRN Foust, Lanney Gins, NP         Discharge Medications: Please see discharge summary for a list of discharge medications.  Relevant Imaging Results:  Relevant Lab Results:   Additional Information SSN: [id number]  Darleene Cleaver, LCSW

## 2023-10-10 NOTE — TOC Progression Note (Addendum)
Transition of Care [facility]) - Progression Note    Patient Details  Name: Paul [name] MRN: [id number] Date of Birth: 1946-12-16  Transition of [facility]) CM/SW Contact  Halford Chessman Phone Number: 10/10/2023, 6:44 PM  Clinical Narrative:     CSW spoke to patient's daughter French Ana [phone] to discuss SNF recommendation and options.  Per patient's daughter he was in rehab at [facility] in April.  CSW discussed using VA benefits verse Medicare A benefits.  Per patient's daughter she would like to try using the Medicare A benefits since he does have both.  CSW explained the difference of using VA verse Medicare A, and she was agreeable to using Medicare A as was patient.  CSW was given permission to begin bed search in [place] area.  CSW to begin bed search in [facility].  Patient has been faxed out awaiting bed offers.   Expected Discharge Plan: Skilled Nursing Facility Barriers to Discharge: SNF Pending bed offer  Expected Discharge Plan and Services       Living arrangements for the past 2 months: Apartment Expected Discharge Date: 10/08/23                                     Social Determinants of Health (SDOH) Interventions SDOH Screenings   Food Insecurity: No Food Insecurity (10/07/2023)  Housing: Low Risk  (10/07/2023)  Transportation Needs: No Transportation Needs (10/07/2023)  Utilities: Not At Risk (10/07/2023)  Financial Resource Strain: Low Risk  (02/03/2023)   Received from [facility], [facility]  Social Connections: Unknown (08/21/2022)   Received from [name], [facility]  Stress: No Stress Concern Present (02/03/2023)   Received from [facility], [facility]  Tobacco Use: High Risk (10/06/2023)    Readmission Risk Interventions    04/08/2023    4:58 PM  Readmission Risk Prevention Plan  Transportation Screening Complete  PCP or Specialist Appt within 5-7 Days Complete  Home Care Screening Complete  Medication  Review (RN CM) Complete

## 2023-10-10 NOTE — Plan of Care (Signed)

## 2023-10-11 DIAGNOSIS — N179 Acute kidney failure, unspecified: Secondary | ICD-10-CM | POA: Diagnosis not present

## 2023-10-11 DIAGNOSIS — E1100 Type 2 diabetes mellitus with hyperosmolarity without nonketotic hyperglycemic-hyperosmolar coma (NKHHC): Secondary | ICD-10-CM | POA: Diagnosis not present

## 2023-10-11 DIAGNOSIS — I7389 Other specified peripheral vascular diseases: Secondary | ICD-10-CM | POA: Diagnosis not present

## 2023-10-11 DIAGNOSIS — K2100 Gastro-esophageal reflux disease with esophagitis, without bleeding: Secondary | ICD-10-CM | POA: Diagnosis not present

## 2023-10-11 LAB — BASIC METABOLIC PANEL
Anion gap: 9 (ref 5–15)
BUN: 16 mg/dL (ref 8–23)
CO2: 23 mmol/L (ref 22–32)
Calcium: 8.5 mg/dL — ABNORMAL LOW (ref 8.9–10.3)
Chloride: 110 mmol/L (ref 98–111)
Creatinine, Ser: 1.28 mg/dL — ABNORMAL HIGH (ref 0.61–1.24)
GFR, Estimated: 57 mL/min — ABNORMAL LOW (ref >60)
Glucose, Bld: 52 mg/dL — ABNORMAL LOW (ref 70–99)
Potassium: 4.1 mmol/L (ref 3.5–5.1)
Sodium: 142 mmol/L (ref 135–145)

## 2023-10-11 LAB — GLUCOSE, CAPILLARY
Glucose-Capillary: 48 mg/dL — ABNORMAL LOW (ref 70–99)
Glucose-Capillary: 130 mg/dL — ABNORMAL HIGH (ref 70–99)
Glucose-Capillary: 158 mg/dL — ABNORMAL HIGH (ref 70–99)
Glucose-Capillary: 236 mg/dL — ABNORMAL HIGH (ref 70–99)
Glucose-Capillary: 203 mg/dL — ABNORMAL HIGH (ref 70–99)

## 2023-10-11 MED ORDER — INSULIN GLARGINE-YFGN 100 UNIT/ML ~~LOC~~ SOLN
30.0000 [IU] | Freq: Every day | SUBCUTANEOUS | Status: DC
  Administered 2023-10-12: 30 [IU] via SUBCUTANEOUS
  Filled 2023-10-11: qty 0.3

## 2023-10-11 NOTE — Progress Notes (Signed)
[facility]                                                                              Paul [name], is a 77 y.o. male, DOB - [birth year], ACZ:[id number] Admit date - 10/06/2023    Outpatient Primary MD [facility] the patient is [name]  LOS - 4  days  Chief Complaint  Patient presents with   Hyperglycemia       Brief summary    Patient is a 77 year old male with lung CA, hypothyroidism, essential hypertension, diabetes mellitus type 2, systolic CHF, EF 30 to 35% in May 2022, CAD status post CABG, COPD presented to ED with increasing weakness.  Patient reported that over the past weeks he had experienced increased thirst and frequency of urination.  Patient's sister at the bedside felt that his speech sounded slightly more slurred and confused.  Otherwise no new FND's. In ED, BP 131/104, heart rate 80, RR 24, O2 sats 96%. VBG showed pH 7.39, pCO2 55, bicarb 33.  Sodium 148, creatinine elevated at 2.7, glucose > 700  Assessment & Plan    Principal Problem:   Hyperosmolar hyperglycemic state (HHS) (HCC) in the setting of uncontrolled DM type 2, IDDM -hemoglobin A1c 12.7 -Has been off the insulin drip, started on subcu insulin CBG (last 3)  Recent Labs    10/11/23 0739 10/11/23 0837 10/11/23 1139  GLUCAP 48* 130* 158*  -Hypoglycemia episode today, decrease Semglee to 30 units daily, continue sliding scale insulin   Active Problems:   AKI (acute kidney injury) (HCC) on CKD stage IIIa -Hold Jardiance, Entresto, metformin, -Presented with creatinine of 2.7, on admission.  Creatinine was 1.5 on 07/14/2023 -Creatinine continues to improve, 1.2 today, at baseline  Acute metabolic encephalopathy ?  Slurred speech - likely in the setting of #1 -CT head showed no acute intracranial abnormality, stable atrophy -MRI brain negative for acute stroke -Chest x-ray showed mild atelectasis or infiltrates at the lung bases, no leukocytosis. -SLP evaluation  done, placed on dysphagia 3 diet  Chronic combined systolic and diastolic CHF -2D echo 04/2021 showed EF of 30 to 35%, G1 DD - hold Jardiance, Entresto.  Does not appear to be on scheduled diuretics outpatient Creatinine at baseline, chest x-ray on 10/10 had shown cardiomegaly with small right-sided pleural effusion.  O2 sats 100% on room air  Generalized debility, gait instability -PT evaluation recommended SNF.  MRI brain negative for acute CVA or metastasis  Hypokalemia -Replace as needed     GERD (gastroesophageal reflux disease) -Continue Protonix     Essential hypertension -BP is stable, currently Entresto, beta-blocker held     Hyperlipidemia, CAD Continue atorvastatin  Adenocarcinoma right lung -Outpatient follow-up with Dr. Arbutus Ped   Obesity Estimated body mass index is 34.96 kg/m as calculated from the following:   Height as of 04/08/23: 5\' 8"  (1.727 m).   Weight as of this encounter: 104.3 kg.  Code Status: Full code DVT Prophylaxis:  enoxaparin (LOVENOX) injection 30 mg Start: 10/06/23 2200   Level of Care: Level of care: Telemetry Family Communication: Updated patient's daughter at the bedside on 10/11 Disposition Plan:  Remains inpatient appropriate: Awaiting SNF when bed available   Procedures:    Consultants:     Antimicrobials:   Anti-infectives (From admission, onward)    None          Medications  aspirin EC  81 mg Oral Daily   atorvastatin  80 mg Oral Daily   enoxaparin (LOVENOX) injection  30 mg Subcutaneous QHS   insulin aspart  0-5 Units Subcutaneous QHS   insulin aspart  0-9 Units Subcutaneous TID WC   insulin glargine-yfgn  35 Units Subcutaneous Daily   levothyroxine  125 mcg Oral Q0600   nicotine  14 mg Transdermal Daily   pantoprazole  40 mg Oral Daily      Subjective:   Paul [name] was seen and examined today.  Doing fairly well, no acute chest pain or shortness of breath.  Awaiting SNF.   Objective:    Vitals:   10/10/23 2059 10/11/23 0524 10/11/23 0524 10/11/23 1059  BP: 114/85 129/88 129/88 131/89  Pulse: 84 79 79 84  Resp: 18 18 18    Temp: 98.4 F (36.9 C) 97.6 F (36.4 C) 97.6 F (36.4 C) 97.8 F (36.6 C)  TempSrc: Oral Oral Oral Oral  SpO2: 99% 99% 99%   Weight:        Intake/Output Summary (Last 24 hours) at 10/11/2023 1215 Last data filed at 10/11/2023 0842 Gross per 24 hour  Intake 120 ml  Output 350 ml  Net -230 ml     Wt Readings from Last 3 Encounters:  10/10/23 104.3 kg  08/07/23 102 kg  04/14/23 106.9 kg    Physical Exam General: Alert and oriented x 3, NAD Cardiovascular: S1 S2 clear, RRR.  Respiratory: CTAB, no wheezing Gastrointestinal: Soft, nontender, nondistended, NBS Ext: no pedal edema bilaterally Neuro: no new deficits Psych: Normal affect   Data Reviewed:  I have personally reviewed following labs    CBC Lab Results  Component Value Date   WBC 7.5 10/09/2023   RBC 4.09 (L) 10/09/2023   HGB 12.4 (L) 10/09/2023   HCT 39.5 10/09/2023   MCV 96.6 10/09/2023   MCH 30.3 10/09/2023   PLT 200 10/09/2023   MCHC 31.4 10/09/2023   RDW 16.1 (H) 10/09/2023   LYMPHSABS 1.3 07/14/2023   MONOABS 0.7 07/14/2023   EOSABS 0.2 07/14/2023   BASOSABS 0.1 07/14/2023     Last metabolic panel Lab Results  Component Value Date   NA 142 10/11/2023   K 4.1 10/11/2023   CL 110 10/11/2023   CO2 23 10/11/2023   BUN 16 10/11/2023   CREATININE 1.28 (H) 10/11/2023   GLUCOSE 52 (L) 10/11/2023   GFRNONAA 57 (L) 10/11/2023   GFRAA 50 (L) 01/13/2019   CALCIUM 8.5 (L) 10/11/2023   PROT 6.9 10/07/2023   ALBUMIN 3.2 (L) 10/07/2023   BILITOT 0.7 10/07/2023   ALKPHOS 90 10/07/2023   AST 29 10/07/2023   ALT 12 10/07/2023   ANIONGAP 9 10/11/2023    CBG (last 3)  Recent Labs    10/11/23 0739 10/11/23 0837 10/11/23 1139  GLUCAP 48* 130* 158*      Coagulation Profile: No results for input(s): "INR", "PROTIME" in the last 168  hours.   Radiology Studies: I have personally reviewed the imaging studies  No results found.     Thad Ranger M.D. [facility] 10/11/2023, 12:15 PM  Available via Epic secure chat 7am-7pm After 7 pm, please refer to night coverage provider listed on amion.

## 2023-10-11 NOTE — Progress Notes (Signed)
Mobility Specialist - Progress Note   10/11/23 1433  Mobility  Activity Ambulated with assistance in hallway  Level of Assistance Standby assist, set-up cues, supervision of patient - no hands on  Assistive Device Front wheel walker  Distance Ambulated (ft) 160 ft  Activity Response Tolerated well  Mobility Referral Yes  $Mobility charge 1 Mobility  Mobility Specialist Start Time (ACUTE ONLY) 0217  Mobility Specialist Stop Time (ACUTE ONLY) 0232  Mobility Specialist Time Calculation (min) (ACUTE ONLY) 15 min   Pt received in bed and agreeable to mobility. Pt was minA from STS. No complaints during session. Pt to recliner after session with all needs met. Chair alarm on.   [facility]

## 2023-10-11 NOTE — TOC Progression Note (Addendum)
Transition of Care [facility]) - Progression Note    Patient Details  Name: Paul [name] MRN: [id number] Date of Birth: 09-28-1946  Transition of Care [facility]) CM/SW Contact  Princella Ion, LCSW Phone Number: 10/11/2023, 10:08 AM  Clinical Narrative:    CSW spoke with [facility] and presented bed offers. French Ana will review Medicare.gov and inform this CSW of which bed she wishes to accept. TOC following.   Addend @ 11:09 AM CSW received call back from pt's daughter who informed she wishes to accept [facility]. CSW called Daisy Blossom Admissions Director at [phone] to inform. Left HIPAA Compliant voicemail requesting call back.   Expected Discharge Plan: Skilled Nursing Facility Barriers to Discharge: SNF Pending bed offer  Expected Discharge Plan and Services       Living arrangements for the past 2 months: Apartment Expected Discharge Date: 10/08/23                                     Social Determinants of Health (SDOH) Interventions SDOH Screenings   Food Insecurity: No Food Insecurity (10/07/2023)  Housing: Low Risk  (10/07/2023)  Transportation Needs: No Transportation Needs (10/07/2023)  Utilities: Not At Risk (10/07/2023)  Financial Resource Strain: Low Risk  (02/03/2023)   Received from [facility]  Social Connections: Unknown (08/21/2022)   Received from [facility], [facility]  Stress: No Stress Concern Present (02/03/2023)   Received from [facility], [facility]  Tobacco Use: High Risk (10/06/2023)    Readmission Risk Interventions    04/08/2023    4:58 PM  Readmission Risk Prevention Plan  Transportation Screening Complete  PCP or Specialist Appt within 5-7 Days Complete  Home Care Screening Complete  Medication Review (RN CM) Complete

## 2023-10-11 NOTE — Plan of Care (Signed)
  Problem: Education: Goal: Ability to describe self-care measures that may prevent or decrease complications (Diabetes Survival Skills Education) will improve Outcome: Progressing   Problem: Tissue Perfusion: Goal: Adequacy of tissue perfusion will improve Outcome: Progressing   Problem: Skin Integrity: Goal: Risk for impaired skin integrity will decrease Outcome: Progressing   Problem: Nutritional: Goal: Maintenance of adequate nutrition will improve Outcome: Progressing Goal: Progress toward achieving an optimal weight will improve Outcome: Progressing

## 2023-10-12 DIAGNOSIS — I10 Essential (primary) hypertension: Secondary | ICD-10-CM | POA: Diagnosis not present

## 2023-10-12 DIAGNOSIS — N179 Acute kidney failure, unspecified: Secondary | ICD-10-CM | POA: Diagnosis not present

## 2023-10-12 DIAGNOSIS — I7389 Other specified peripheral vascular diseases: Secondary | ICD-10-CM | POA: Diagnosis not present

## 2023-10-12 DIAGNOSIS — E1100 Type 2 diabetes mellitus with hyperosmolarity without nonketotic hyperglycemic-hyperosmolar coma (NKHHC): Secondary | ICD-10-CM | POA: Diagnosis not present

## 2023-10-12 LAB — BASIC METABOLIC PANEL
Anion gap: 6 (ref 5–15)
BUN: 14 mg/dL (ref 8–23)
CO2: 24 mmol/L (ref 22–32)
Calcium: 8.1 mg/dL — ABNORMAL LOW (ref 8.9–10.3)
Chloride: 109 mmol/L (ref 98–111)
Creatinine, Ser: 1.31 mg/dL — ABNORMAL HIGH (ref 0.61–1.24)
GFR, Estimated: 55 mL/min — ABNORMAL LOW (ref >60)
Glucose, Bld: 97 mg/dL (ref 70–99)
Potassium: 3.5 mmol/L (ref 3.5–5.1)
Sodium: 139 mmol/L (ref 135–145)

## 2023-10-12 LAB — GLUCOSE, CAPILLARY
Glucose-Capillary: 96 mg/dL (ref 70–99)
Glucose-Capillary: 107 mg/dL — ABNORMAL HIGH (ref 70–99)
Glucose-Capillary: 188 mg/dL — ABNORMAL HIGH (ref 70–99)

## 2023-10-12 MED ORDER — INSULIN GLARGINE-YFGN 100 UNIT/ML ~~LOC~~ SOPN: 30.0000 [IU] | PEN_INJECTOR | Freq: Every day | SUBCUTANEOUS | Status: DC

## 2023-10-12 MED ORDER — CARVEDILOL 3.125 MG PO TABS: 3.1250 mg | ORAL_TABLET | Freq: Two times a day (BID) | ORAL | Status: DC

## 2023-10-12 MED ORDER — INSULIN ASPART 100 UNIT/ML IJ SOLN: 0.0000 [IU] | Freq: Three times a day (TID) | INTRAMUSCULAR | Status: DC

## 2023-10-12 MED ORDER — GUAIFENESIN-DM 100-10 MG/5ML PO SYRP: 5.0000 mL | ORAL_SOLUTION | ORAL | Status: DC | PRN

## 2023-10-12 MED ORDER — EMPAGLIFLOZIN 10 MG PO TABS: 10.0000 mg | ORAL_TABLET | Freq: Every day | ORAL | Status: DC

## 2023-10-12 MED ORDER — INSULIN GLARGINE-YFGN 100 UNIT/ML ~~LOC~~ SOPN: 30.0000 [IU] | PEN_INJECTOR | Freq: Every morning | SUBCUTANEOUS | Status: DC

## 2023-10-12 MED ORDER — METFORMIN HCL ER (OSM) 1000 MG PO TB24: 1000.0000 mg | ORAL_TABLET | Freq: Every day | ORAL | Status: DC

## 2023-10-12 MED ORDER — SACUBITRIL-VALSARTAN 49-51 MG PO TABS: 1.0000 | ORAL_TABLET | Freq: Two times a day (BID) | ORAL | Status: AC

## 2023-10-12 NOTE — Discharge Summary (Signed)
Physician Discharge Summary   Patient: Paul [name] MRN: [id number] DOB: 09/13/1946  Admit date:     10/06/2023  Discharge date: 10/12/23  Discharge Physician: Thad Ranger, MD    PCP: [name]   Recommendations at discharge:   Continue Semglee 30 units daily AM, Jardiance 10 mg daily, metformin 1000 mg daily p.m. Coreg decreased to 3.125 mg twice daily  Discharge Diagnoses:    Hyperosmolar hyperglycemic state (HHS) (HCC) Diabetes mellitus type 2, uncontrolled, IDDM   AKI (acute kidney injury) (HCC) superimposed on CKD stage IIIa Acute metabolic encephalopathy, resolved Chronic combined systolic and diastolic CHF Generalized debility   GERD (gastroesophageal reflux disease)   Essential hypertension   Hyperlipidemia   Stage 3a chronic kidney disease (CKD) (HCC)   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 [facility])   Hospital Course: Patient is a 77 year old male with lung CA, hypothyroidism, essential hypertension, diabetes mellitus type 2, systolic CHF, EF 30 to 35% in May 2022, CAD status post CABG, COPD presented to ED with increasing weakness.  Patient reported that over the past weeks he had experienced increased thirst and frequency of urination.  Patient's sister at the bedside felt that his speech sounded slightly more slurred and confused.  Otherwise no new FND's. In ED, BP 131/104, heart rate 80, RR 24, O2 sats 96%. VBG showed pH 7.39, pCO2 55, bicarb 33.  Sodium 148, creatinine elevated at 2.7, glucose > 700  Assessment and Plan:    Hyperosmolar hyperglycemic state (HHS) (HCC) in the setting of uncontrolled DM type 2, IDDM -hemoglobin A1c 12.7 -Patient was placed on insulin drip, IV fluids, subsequently transitioned to subcutaneous insulin -Patient had been forgetting to use the basal insulin at home.  Once he was resumed on basal insulin, he was noted to be getting hypoglycemic hence the basal insulin/Semglee decreased to 30  units daily. -Given his goal is to get home from the short-term rehab, he will likely not take basal insulin and meal coverage regimen due to prior noncompliance. -Patient was followed closely by the diabetic coordinator.  To improve compliance, plan to continue Semglee at 30 units daily AM, Jardiance 10 mg daily, metformin 1000 mg daily PM -Please adjust this regimen for his blood sugar readings.     AKI (acute kidney injury) (HCC) on CKD stage IIIa -Metformin, Jardiance and Entresto were held on admission -Presented with creatinine of 2.7, on admission.  Creatinine was 1.5 on 07/14/2023, baseline -Creatinine has continued to improve, 1.3 at discharge   Acute metabolic encephalopathy ?  Slurred speech - likely in the setting of #1 -CT head showed no acute intracranial abnormality, stable atrophy -MRI brain negative for acute stroke -Chest x-ray showed mild atelectasis or infiltrates at the lung bases, no leukocytosis. -SLP evaluation done, placed on dysphagia 3 diet   Chronic combined systolic and diastolic CHF -2D echo 04/2021 showed EF of 30 to 35%, G1 DD -  Does not appear to be on scheduled diuretics outpatient Creatinine at baseline, chest x-ray on 10/10 had shown cardiomegaly with small right-sided pleural effusion.  O2 sats 100% on room air -Resumed Jardiance and Entresto   Generalized debility, gait instability -PT evaluation recommended SNF.  MRI brain negative for acute CVA or metastasis   Hypokalemia -Replace as needed       GERD (gastroesophageal reflux disease) -Continue Protonix       Essential hypertension -BP is stable -Resumed Entresto, Coreg decreased to 3.125 mg twice daily     Hyperlipidemia, CAD Continue  atorvastatin   Adenocarcinoma right lung -Outpatient follow-up with Dr. Arbutus Ped     Obesity Estimated body mass index is 34.96 kg/m as calculated from the following:   Height as of 04/08/23: 5\' 8"  (1.727 m).   Weight as of this encounter: 104.3  kg      Pain control - [place] Controlled Substance Reporting System database was reviewed. and patient was instructed, not to drive, operate heavy machinery, perform activities at heights, swimming or participation in water activities or provide baby-sitting services while on Pain, Sleep and Anxiety Medications; until their outpatient Physician has advised to do so again. Also recommended to not to take more than prescribed Pain, Sleep and Anxiety Medications.  Consultants:  Procedures performed: None Disposition: Skilled nursing facility Diet recommendation: Carb modified diet, dysphagia 3 mechanical soft diet with thin liquids  DISCHARGE MEDICATION: Allergies as of 10/12/2023   No Known Allergies      Medication List     STOP taking these medications    Muscle Rub 10-15 % Crea       TAKE these medications    albuterol 108 (90 Base) MCG/ACT inhaler Commonly known as: VENTOLIN HFA Inhale 2 puffs into the lungs every 6 (six) hours as needed for wheezing or shortness of breath.   albuterol (2.5 MG/3ML) 0.083% nebulizer solution Commonly known as: PROVENTIL Inhale 3 mLs into the lungs 3 (three) times daily as needed for shortness of breath.   ammonium lactate 12 % lotion Commonly known as: LAC-HYDRIN Apply 1 Application topically.   aspirin 81 MG chewable tablet Take 81 mg by mouth daily.   atorvastatin 80 MG tablet Commonly known as: LIPITOR Take 80 mg by mouth daily.   Blood Glucose Monitoring Suppl Devi 1 each by Does not apply route 3 (three) times daily. May dispense any manufacturer covered by patient's insurance.   BLOOD GLUCOSE TEST STRIPS Strp 1 each by Does not apply route 3 (three) times daily. Use as directed to check blood sugar. May dispense any manufacturer covered by patient's insurance and fits patient's device.   carvedilol 3.125 MG tablet Commonly known as: COREG Take 1 tablet (3.125 mg total) by mouth 2 (two) times daily with a  meal. What changed:  medication strength how much to take   clobetasol cream 0.05 % Commonly known as: TEMOVATE Apply 1 Application topically 2 (two) times daily as needed (back pain).   [employer] Daily Multiple For Men Tabs Take 1 tablet by mouth daily with breakfast.   empagliflozin 10 MG Tabs tablet Commonly known as: Jardiance Take 1 tablet (10 mg total) by mouth daily before breakfast. What changed: when to take this   fluticasone 50 MCG/ACT nasal spray Commonly known as: FLONASE Place 2 sprays into both nostrils daily.   guaiFENesin 600 MG 12 hr tablet Commonly known as: MUCINEX Take 1 tablet (600 mg total) by mouth 2 (two) times daily.   guaiFENesin-dextromethorphan 100-10 MG/5ML syrup Commonly known as: ROBITUSSIN DM Take 5 mLs by mouth every 4 (four) hours as needed for cough.   insulin glargine-yfgn 100 UNIT/ML Pen Commonly known as: SEMGLEE Inject 30 Units into the skin in the morning. What changed:  how much to take when to take this   Lancet Device Misc 1 each by Does not apply route 3 (three) times daily. May dispense any manufacturer covered by patient's insurance.   Lancets Misc 1 each by Does not apply route 3 (three) times daily. Use as directed to check blood sugar. May dispense  any manufacturer covered by patient's insurance and fits patient's device.   levothyroxine 125 MCG tablet Commonly known as: Synthroid Take 1 tablet (125 mcg total) by mouth daily before breakfast.   lidocaine-prilocaine cream Commonly known as: EMLA Apply 1 application topically as needed. What changed:  when to take this reasons to take this   metformin 1000 MG (OSM) 24 hr tablet Commonly known as: FORTAMET Take 1 tablet (1,000 mg total) by mouth at bedtime. What changed:  medication strength when to take this   nitroGLYCERIN 0.4 MG SL tablet Commonly known as: NITROSTAT Place 0.4 mg under the tongue every 5 (five) minutes as needed for chest pain.   ondansetron 4  MG tablet Commonly known as: ZOFRAN Take 1 tablet (4 mg total) by mouth every 6 (six) hours as needed for nausea or vomiting.   pantoprazole 40 MG tablet Commonly known as: PROTONIX Take 1 tablet (40 mg total) by mouth daily.   Pen Needles 31G X 5 MM Misc 1 each by Does not apply route 3 (three) times daily. May dispense any manufacturer covered by patient's insurance.   polyethylene glycol 17 g packet Commonly known as: MIRALAX / GLYCOLAX Take 17 g by mouth daily.   sacubitril-valsartan 49-51 MG Commonly known as: ENTRESTO Take 1 tablet by mouth 2 (two) times daily.   senna-docusate 8.6-50 MG tablet Commonly known as: Senokot-S Take 2 tablets by mouth 2 (two) times daily.   silver sulfADIAZINE 1 % cream Commonly known as: SILVADENE Apply 1 Application topically daily as needed (rash).   urea 10 % cream Commonly known as: CARMOL Apply topically.        Contact information for follow-up providers     [facility]inic, [facility] Schedule an appointment as soon as possible for a visit in 2 week(s).   Why: for hospital follow-up Contact information: [address] [facility] [place] [address] [phone]              Contact information for after-discharge care     Destination     [facility] Preferred SNF .   Service: Skilled Nursing Contact information: [address] [name] [address] [phone]                    Discharge Exam: Ceasar Mons Weights   10/10/23 0500  Weight: 104.3 kg   S: Sitting up in the chair, hoping to get started today and start rehab.  No fevers or chills, no chest pain. BP (!) 153/80   Pulse 87   Temp 98.4 F (36.9 C) (Oral)   Resp 18   Wt 104.3 kg   SpO2 100%   BMI 34.96 kg/m   Physical Exam General: Alert and oriented x 3, NAD Cardiovascular: S1 S2 clear, RRR.  Respiratory: CTAB, no wheezing, rales or rhonchi Gastrointestinal: Soft, nontender,  nondistended, NBS Ext: no pedal edema bilaterally Neuro: no new deficits Psych: Normal affect    Condition at discharge: fair  The results of significant diagnostics from this hospitalization (including imaging, microbiology, ancillary and laboratory) are listed below for reference.   Imaging Studies: MR BRAIN WO CONTRAST  Result Date: 10/09/2023 CLINICAL DATA:  Initial evaluation for neuro deficit, stroke suspected. EXAM: MRI HEAD WITHOUT CONTRAST TECHNIQUE: Multiplanar, multiecho pulse sequences of the brain and surrounding structures were obtained without intravenous contrast. COMPARISON:  Prior study from 10/06/2023. FINDINGS: Brain: Mild age-related cerebral atrophy. Mild hazy T2/FLAIR hyperintensity involving the periventricular deep white matter, most characteristic  of chronic microvascular ischemic disease, mild for age. No evidence for acute or subacute ischemia. Gray-white matter differentiation maintained. No areas of chronic cortical infarction. No acute or chronic intracranial blood products. No mass lesion, midline shift or mass effect. No hydrocephalus or extra-axial fluid collection. Pituitary gland and suprasellar region within normal limits. Vascular: Major intracranial vascular flow voids are maintained. Skull and upper cervical spine: Craniocervical junction within normal limits. Bone marrow signal intensity normal. Degenerative spondylosis within the visualized upper cervical spine without high-grade spinal stenosis. No scalp soft tissue abnormality. Sinuses/Orbits: Globes and orbital soft tissues within normal limits. Scattered mucosal thickening present about the ethmoidal air cells and maxillary sinuses. Paranasal sinuses are otherwise clear. No significant mastoid effusion. Other: None. IMPRESSION: 1. No acute intracranial abnormality. 2. Mild age-related cerebral atrophy with chronic small vessel ischemic disease. Electronically Signed   By: Rise Mu M.D.   On:  10/09/2023 01:38   DG Chest Port 1 View  Result Date: 10/08/2023 CLINICAL DATA:  Congestion of respiratory tract, hyperglycemia. EXAM: PORTABLE CHEST 1 VIEW COMPARISON:  10/06/2023, 09/15/2023. FINDINGS: The heart size and mediastinal contours are stable. Lung volumes are low with patchy airspace disease at the lung bases. There is a small right pleural effusion. No pneumothorax. Sternotomy wires are present over the midline. No acute osseous abnormality is seen. IMPRESSION: 1. Mild atelectasis or infiltrate at the lung bases and concern for recurrent tumor in right lower lobe per recent PET-CT. 2. Small right pleural effusion. 3. Cardiomegaly. Electronically Signed   By: Thornell Sartorius M.D.   On: 10/08/2023 20:30   DG CHEST PORT 1 VIEW  Result Date: 10/06/2023 CLINICAL DATA:  Cough. EXAM: PORTABLE CHEST 1 VIEW COMPARISON:  Radiograph 04/07/2023.  PET CT 09/15/2023 reviewed FINDINGS: Prior median sternotomy. Cardiomegaly is stable. Small right pleural effusion and right infrahilar opacity, also seen on prior PET. The left lung base opacity is difficult to accurately assessed due to soft tissue attenuation. No pneumothorax. IMPRESSION: 1. Small right pleural effusion and right infrahilar opacity, also seen on prior PET-CT. 2. Stable cardiomegaly. Electronically Signed   By: Narda Rutherford M.D.   On: 10/06/2023 18:37   CT HEAD WO CONTRAST ( )  Result Date: 10/06/2023 CLINICAL DATA:  Mental status change, unknown cause slurred speech per family EXAM: CT HEAD WITHOUT CONTRAST TECHNIQUE: Contiguous axial images were obtained from the base of the skull through the vertex without intravenous contrast. RADIATION DOSE REDUCTION: This exam was performed according to the departmental dose-optimization program which includes automated exposure control, adjustment of the mA and/or kV according to patient size and/or use of iterative reconstruction technique. COMPARISON:  Brain MRI 11/14/2021 FINDINGS: Brain: No  intracranial hemorrhage, mass effect, or midline shift. Stable degree of atrophy. No hydrocephalus. The basilar cisterns are patent. No evidence of territorial infarct or acute ischemia. No extra-axial or intracranial fluid collection. Vascular: No hyperdense vessel or unexpected calcification. Skull: No fracture or focal lesion. Sinuses/Orbits: Paranasal sinuses and mastoid air cells are clear. The visualized orbits are unremarkable. Other: No scalp hematoma IMPRESSION: 1. No acute intracranial abnormality. 2. Stable atrophy. Electronically Signed   By: Narda Rutherford M.D.   On: 10/06/2023 15:50   NM PET Image Restage (PS) Skull Base to Thigh (F-18 FDG)  Result Date: 09/29/2023 CLINICAL DATA:  Subsequent treatment strategy for non-small cell lung cancer. EXAM: NUCLEAR MEDICINE PET SKULL BASE TO THIGH TECHNIQUE: 11.1 mCi F-18 FDG was injected intravenously. Full-ring PET imaging was performed from the skull base to thigh  after the radiotracer. CT data was obtained and used for attenuation correction and anatomic localization. Fasting blood glucose: 261 mg/dl COMPARISON:  CT chest dated 07/14/2023.  PET-CT dated 05/31/2021. FINDINGS: Mediastinal blood pool activity: SUV max 3.2 Liver activity: SUV max NA NECK: No hypermetabolic cervical lymphadenopathy. Incidental CT findings: None. CHEST: Scarring with bronchiectasis in radiation changes in the left lower lobe. Mild patchy/nodular opacity at the posterior left lung base (series 7/image 55), max SUV 4.7. While indeterminate, recurrent tumor is not excluded. Additional radiation changes in the right perihilar and infrahilar lung. Laterally in the right lower lobe there is focal patchy opacity with max SUV 5.2. This appearance raises concern for tumor recurrence. Subpleural scarring in the right lung, favoring post treatment changes. Small right pleural effusion, grossly unchanged.  No pneumothorax. Small mediastinal nodes, including dominant 12 mm short axis low  right paratracheal node (series 4/image 60), max SUV 4.6. This appearance suggests nodal metastases. Incidental CT findings: Atherosclerotic calcifications of the aortic arch. Mild three-vessel coronary atherosclerosis. Postsurgical changes related to prior CABG. Mild cardiomegaly. ABDOMEN/PELVIS: No abnormal hypermetabolism in the liver, spleen, pancreas, or adrenal glands. No hypermetabolic abdominopelvic lymphadenopathy. Incidental CT findings: Atherosclerotic calcifications of the abdominal aorta and branch vessels. SKELETON: No focal hypermetabolic activity to suggest skeletal metastasis. Incidental CT findings: Degenerative changes of the visualized thoracolumbar spine. Median sternotomy. IMPRESSION: Radiation changes in the bilateral lower lobes. Suspected recurrent tumor in the lateral right lower lobe. Possible recurrent tumor in the posterior left lower lobe, although indeterminate. Small mediastinal nodal metastases, as above. Additional ancillary findings as above. Electronically Signed   By: Charline Bills M.D.   On: 09/29/2023 00:17    Microbiology: Results for orders placed or performed during the hospital encounter of 03/16/23  Blood culture (routine x 2)     Status: None   Collection Time: 03/16/23  9:08 PM   Specimen: BLOOD  Result Value Ref Range Status   Specimen Description   Final    BLOOD RIGHT ANTECUBITAL Performed at [facility], [address] [place], [place] [address]    Special Requests   Final    BOTTLES DRAWN AEROBIC AND ANAEROBIC Blood Culture results may not be optimal due to an excessive volume of blood received in culture bottles Performed at [facility], [address], [place], [place] [address]    Culture   Final    NO GROWTH 5 DAYS Performed at [facility], [address] [place], [place] [address]    Report Status 03/21/2023 FINAL  Final  Blood culture (routine x 2)     Status: None   Collection Time:  03/16/23  9:30 PM   Specimen: Site Not Specified; Blood  Result Value Ref Range Status   Specimen Description   Final    SITE NOT SPECIFIED Performed at [facility], [address] [place], [place] [address]    Special Requests   Final    BOTTLES DRAWN AEROBIC AND ANAEROBIC Blood Culture results may not be optimal due to an excessive volume of blood received in culture bottles Performed at [facility], [address], [place], [place] [address]    Culture   Final    NO GROWTH 5 DAYS Performed at [facility], [address], [place], [place] [address]    Report Status 03/21/2023 FINAL  Final    Labs: CBC: Recent Labs  Lab 10/06/23 1052 10/06/23 1101 10/07/23 0655 10/09/23 2841  WBC 7.7  --  8.5 7.5  HGB 15.3 17.3* 12.6* 12.4*  HCT 50.0 51.0 40.6 39.5  MCV 97.3  --  97.8 96.6  PLT 242  --  197 200   Basic Metabolic Panel: Recent Labs  Lab 10/08/23 0733 10/09/23 0358 10/10/23 0405 10/11/23 0410 10/12/23 0350  NA 145 146* 144 142 139  K 3.1* 3.4* 3.8 4.1 3.5  CL 110 110 111 110 109  CO2 27 26 25 23 24   GLUCOSE 130* 53* 115* 52* 97  BUN 31* 26* 20 16 14   CREATININE 1.65* 1.55* 1.45* 1.28* 1.31*  CALCIUM 8.4* 8.8* 8.4* 8.5* 8.1*   Liver Function Tests: Recent Labs  Lab 10/07/23 0500  AST 29  ALT 12  ALKPHOS 90  BILITOT 0.7  PROT 6.9  ALBUMIN 3.2*   CBG: Recent Labs  Lab 10/11/23 0837 10/11/23 1139 10/11/23 1634 10/11/23 2142 10/12/23 0812  GLUCAP 130* 158* 236* 203* 96    Discharge time spent: greater than 30 minutes.  Signed: Thad Ranger, MD [facility] 10/12/2023

## 2023-10-12 NOTE — TOC Transition Note (Addendum)
Transition of Care [facility]) - CM/SW Discharge Note   Patient Details  Name: Paul [name] MRN: [id number] Date of Birth: 12/07/1946  Transition of Care [facility] CM/SW Contact:  Larrie Kass, LCSW Phone Number: 10/12/2023, 9:43 AM   Clinical Narrative:    CSW sent text and left VM with admission with [facility] to veify if pt can admit today, awaiting on response.  10:10am Received message from [facility] asking if pt has had other hospitalization since leaving last SNF facility. CSW attempted to call pt's family, no answer left VM requesting a call back.   1:11pm Pt to d/c to [facility], room 808 call report [phone]. Pt's daughter was made aware and has agreed to d/c plan. PTAR called , TOC sign off.   Final next level of care: Skilled Nursing Facility Barriers to Discharge: No Barriers Identified   Patient Goals and CMS Choice CMS Medicare.gov Compare Post Acute Care list provided to:: Patient Choice offered to / list presented to : Patient, Adult Children  Discharge Placement                Patient chooses bed at: [facility] Patient to be transferred to facility by: EMS Name of family member notified: Hill,Tracy (Daughter)  [phone] (Mobile) Patient and family notified of of transfer: 10/12/23  Discharge Plan and Services Additional resources added to the After Visit Summary for                                       Social Determinants of Health (SDOH) Interventions SDOH Screenings   Food Insecurity: No Food Insecurity (10/07/2023)  Housing: Low Risk  (10/07/2023)  Transportation Needs: No Transportation Needs (10/07/2023)  Utilities: Not At Risk (10/07/2023)  Financial Resource Strain: Low Risk  (02/03/2023)   Received from [facility]  Social Connections: Unknown (08/21/2022)   Received from [facility]  Stress: No Stress Concern Present (02/03/2023)   Received from [facility], [facility]   Tobacco Use: High Risk (10/06/2023)     Readmission Risk Interventions    04/08/2023    4:58 PM  Readmission Risk Prevention Plan  Transportation Screening Complete  PCP or Specialist Appt within 5-7 Days Complete  Home Care Screening Complete  Medication Review (RN CM) Complete

## 2023-10-13 ENCOUNTER — Other Ambulatory Visit (HOSPITAL_COMMUNITY): Payer: Self-pay

## 2023-10-19 ENCOUNTER — Other Ambulatory Visit: Payer: Self-pay | Admitting: Nurse Practitioner

## 2023-10-19 DIAGNOSIS — R1310 Dysphagia, unspecified: Secondary | ICD-10-CM

## 2023-10-20 ENCOUNTER — Other Ambulatory Visit: Payer: Self-pay

## 2023-10-22 ENCOUNTER — Other Ambulatory Visit (HOSPITAL_COMMUNITY): Payer: Self-pay | Admitting: Nurse Practitioner

## 2023-10-22 DIAGNOSIS — R1310 Dysphagia, unspecified: Secondary | ICD-10-CM

## 2023-10-23 ENCOUNTER — Inpatient Hospital Stay: Admission: RE | Admit: 2023-10-23 | Payer: No Typology Code available for payment source | Source: Ambulatory Visit

## 2023-10-30 ENCOUNTER — Ambulatory Visit (HOSPITAL_COMMUNITY)
Admission: RE | Admit: 2023-10-30 | Discharge: 2023-10-30 | Disposition: A | Discharge status: Home / Self Care | Payer: No Typology Code available for payment source | Source: Ambulatory Visit | Attending: Nurse Practitioner | Admitting: Nurse Practitioner

## 2023-10-30 DIAGNOSIS — R1310 Dysphagia, unspecified: Secondary | ICD-10-CM | POA: Insufficient documentation

## 2023-11-03 ENCOUNTER — Other Ambulatory Visit: Payer: No Typology Code available for payment source

## 2024-01-08 ENCOUNTER — Other Ambulatory Visit (HOSPITAL_COMMUNITY): Payer: Self-pay | Admitting: Student

## 2024-01-08 DIAGNOSIS — C3490 Malignant neoplasm of unspecified part of unspecified bronchus or lung: Secondary | ICD-10-CM

## 2024-01-14 ENCOUNTER — Other Ambulatory Visit: Payer: Self-pay

## 2024-01-22 ENCOUNTER — Encounter (HOSPITAL_COMMUNITY)
Admission: RE | Admit: 2024-01-22 | Discharge: 2024-01-22 | Disposition: A | Discharge status: Home / Self Care | Payer: No Typology Code available for payment source | Source: Ambulatory Visit | Attending: Student | Admitting: Student

## 2024-01-22 DIAGNOSIS — C3490 Malignant neoplasm of unspecified part of unspecified bronchus or lung: Secondary | ICD-10-CM | POA: Insufficient documentation

## 2024-01-22 LAB — GLUCOSE, CAPILLARY: Glucose-Capillary: 144 mg/dL — ABNORMAL HIGH (ref 70–99)

## 2024-01-22 MED ORDER — FLUDEOXYGLUCOSE F - 18 (FDG) INJECTION
11.3200 | Freq: Once | INTRAVENOUS | Status: AC
  Administered 2024-01-22: 11.32 via INTRAVENOUS

## 2024-05-17 ENCOUNTER — Emergency Department (HOSPITAL_COMMUNITY)

## 2024-05-17 ENCOUNTER — Inpatient Hospital Stay (HOSPITAL_COMMUNITY)
Admission: EM | Admit: 2024-05-17 | Discharge: 2024-05-25 | DRG: 871 | Disposition: A | Discharge status: Skilled Nursing Facility | Attending: Family Medicine | Admitting: Family Medicine

## 2024-05-17 ENCOUNTER — Other Ambulatory Visit: Payer: Self-pay

## 2024-05-17 DIAGNOSIS — A419 Sepsis, unspecified organism: Secondary | ICD-10-CM | POA: Diagnosis present

## 2024-05-17 DIAGNOSIS — G4733 Obstructive sleep apnea (adult) (pediatric): Secondary | ICD-10-CM | POA: Diagnosis present

## 2024-05-17 DIAGNOSIS — I2510 Atherosclerotic heart disease of native coronary artery without angina pectoris: Secondary | ICD-10-CM | POA: Diagnosis present

## 2024-05-17 DIAGNOSIS — Z951 Presence of aortocoronary bypass graft: Secondary | ICD-10-CM

## 2024-05-17 DIAGNOSIS — Z923 Personal history of irradiation: Secondary | ICD-10-CM

## 2024-05-17 DIAGNOSIS — Z85118 Personal history of other malignant neoplasm of bronchus and lung: Secondary | ICD-10-CM

## 2024-05-17 DIAGNOSIS — A413 Sepsis due to Hemophilus influenzae: Secondary | ICD-10-CM | POA: Diagnosis not present

## 2024-05-17 DIAGNOSIS — Z91148 Patient's other noncompliance with medication regimen for other reason: Secondary | ICD-10-CM

## 2024-05-17 DIAGNOSIS — G9341 Metabolic encephalopathy: Secondary | ICD-10-CM | POA: Diagnosis present

## 2024-05-17 DIAGNOSIS — Z79890 Hormone replacement therapy (postmenopausal): Secondary | ICD-10-CM

## 2024-05-17 DIAGNOSIS — Z794 Long term (current) use of insulin: Secondary | ICD-10-CM

## 2024-05-17 DIAGNOSIS — E11649 Type 2 diabetes mellitus with hypoglycemia without coma: Secondary | ICD-10-CM | POA: Diagnosis present

## 2024-05-17 DIAGNOSIS — Z7982 Long term (current) use of aspirin: Secondary | ICD-10-CM

## 2024-05-17 DIAGNOSIS — R570 Cardiogenic shock: Secondary | ICD-10-CM | POA: Diagnosis not present

## 2024-05-17 DIAGNOSIS — F17210 Nicotine dependence, cigarettes, uncomplicated: Secondary | ICD-10-CM | POA: Diagnosis present

## 2024-05-17 DIAGNOSIS — R6521 Severe sepsis with septic shock: Secondary | ICD-10-CM | POA: Diagnosis present

## 2024-05-17 DIAGNOSIS — C3490 Malignant neoplasm of unspecified part of unspecified bronchus or lung: Secondary | ICD-10-CM | POA: Diagnosis present

## 2024-05-17 DIAGNOSIS — Z6837 Body mass index (BMI) 37.0-37.9, adult: Secondary | ICD-10-CM

## 2024-05-17 DIAGNOSIS — N1832 Chronic kidney disease, stage 3b: Secondary | ICD-10-CM | POA: Diagnosis present

## 2024-05-17 DIAGNOSIS — E1122 Type 2 diabetes mellitus with diabetic chronic kidney disease: Secondary | ICD-10-CM | POA: Diagnosis present

## 2024-05-17 DIAGNOSIS — J700 Acute pulmonary manifestations due to radiation: Secondary | ICD-10-CM | POA: Diagnosis present

## 2024-05-17 DIAGNOSIS — J14 Pneumonia due to Hemophilus influenzae: Secondary | ICD-10-CM | POA: Diagnosis present

## 2024-05-17 DIAGNOSIS — I255 Ischemic cardiomyopathy: Secondary | ICD-10-CM | POA: Diagnosis present

## 2024-05-17 DIAGNOSIS — J9602 Acute respiratory failure with hypercapnia: Secondary | ICD-10-CM | POA: Diagnosis present

## 2024-05-17 DIAGNOSIS — I5041 Acute combined systolic (congestive) and diastolic (congestive) heart failure: Secondary | ICD-10-CM

## 2024-05-17 DIAGNOSIS — F4310 Post-traumatic stress disorder, unspecified: Secondary | ICD-10-CM | POA: Diagnosis present

## 2024-05-17 DIAGNOSIS — R579 Shock, unspecified: Secondary | ICD-10-CM | POA: Diagnosis present

## 2024-05-17 DIAGNOSIS — E785 Hyperlipidemia, unspecified: Secondary | ICD-10-CM | POA: Diagnosis present

## 2024-05-17 DIAGNOSIS — J440 Chronic obstructive pulmonary disease with acute lower respiratory infection: Secondary | ICD-10-CM | POA: Diagnosis present

## 2024-05-17 DIAGNOSIS — E035 Myxedema coma: Secondary | ICD-10-CM | POA: Diagnosis present

## 2024-05-17 DIAGNOSIS — Z7984 Long term (current) use of oral hypoglycemic drugs: Secondary | ICD-10-CM

## 2024-05-17 DIAGNOSIS — N170 Acute kidney failure with tubular necrosis: Secondary | ICD-10-CM | POA: Diagnosis present

## 2024-05-17 DIAGNOSIS — Z9221 Personal history of antineoplastic chemotherapy: Secondary | ICD-10-CM

## 2024-05-17 DIAGNOSIS — J918 Pleural effusion in other conditions classified elsewhere: Secondary | ICD-10-CM | POA: Diagnosis present

## 2024-05-17 DIAGNOSIS — J9601 Acute respiratory failure with hypoxia: Principal | ICD-10-CM | POA: Diagnosis present

## 2024-05-17 DIAGNOSIS — E1165 Type 2 diabetes mellitus with hyperglycemia: Secondary | ICD-10-CM | POA: Diagnosis present

## 2024-05-17 DIAGNOSIS — E66812 Obesity, class 2: Secondary | ICD-10-CM | POA: Diagnosis present

## 2024-05-17 DIAGNOSIS — I5023 Acute on chronic systolic (congestive) heart failure: Secondary | ICD-10-CM | POA: Diagnosis present

## 2024-05-17 DIAGNOSIS — I130 Hypertensive heart and chronic kidney disease with heart failure and stage 1 through stage 4 chronic kidney disease, or unspecified chronic kidney disease: Secondary | ICD-10-CM | POA: Diagnosis present

## 2024-05-17 DIAGNOSIS — T380X5A Adverse effect of glucocorticoids and synthetic analogues, initial encounter: Secondary | ICD-10-CM | POA: Diagnosis present

## 2024-05-17 LAB — CBC WITH DIFFERENTIAL/PLATELET
Abs Immature Granulocytes: 0.02 10*3/uL (ref 0.00–0.07)
Basophils Absolute: 0 10*3/uL (ref 0.0–0.1)
Basophils Relative: 1 %
Eosinophils Absolute: 0.1 10*3/uL (ref 0.0–0.5)
Eosinophils Relative: 3 %
HCT: 37.7 % — ABNORMAL LOW (ref 39.0–52.0)
Hemoglobin: 12 g/dL — ABNORMAL LOW (ref 13.0–17.0)
Immature Granulocytes: 0 %
Lymphocytes Relative: 25 %
Lymphs Abs: 1.1 10*3/uL (ref 0.7–4.0)
MCH: 31.7 pg (ref 26.0–34.0)
MCHC: 31.8 g/dL (ref 30.0–36.0)
MCV: 99.5 fL (ref 80.0–100.0)
Monocytes Absolute: 0.3 10*3/uL (ref 0.1–1.0)
Monocytes Relative: 6 %
Neutro Abs: 2.9 10*3/uL (ref 1.7–7.7)
Neutrophils Relative %: 65 %
Platelets: 163 10*3/uL (ref 150–400)
RBC: 3.79 MIL/uL — ABNORMAL LOW (ref 4.22–5.81)
RDW: 18.2 % — ABNORMAL HIGH (ref 11.5–15.5)
WBC: 4.6 10*3/uL (ref 4.0–10.5)
nRBC: 0.4 % — ABNORMAL HIGH (ref 0.0–0.2)

## 2024-05-17 LAB — I-STAT ARTERIAL BLOOD GAS, ED
Acid-Base Excess: 1 mmol/L (ref 0.0–2.0)
Bicarbonate: 27.7 mmol/L (ref 20.0–28.0)
Calcium, Ion: 1.32 mmol/L (ref 1.15–1.40)
HCT: 31 % — ABNORMAL LOW (ref 39.0–52.0)
Hemoglobin: 10.5 g/dL — ABNORMAL LOW (ref 13.0–17.0)
O2 Saturation: 100 %
Patient temperature: 95.9
Potassium: 4 mmol/L (ref 3.5–5.1)
Sodium: 147 mmol/L — ABNORMAL HIGH (ref 135–145)
TCO2: 29 mmol/L (ref 22–32)
pCO2 arterial: 52 mmHg — ABNORMAL HIGH (ref 32–48)
pH, Arterial: 7.328 — ABNORMAL LOW (ref 7.35–7.45)
pO2, Arterial: 212 mmHg — ABNORMAL HIGH (ref 83–108)

## 2024-05-17 LAB — COMPREHENSIVE METABOLIC PANEL WITH GFR
ALT: 27 U/L (ref 0–44)
AST: 47 U/L — ABNORMAL HIGH (ref 15–41)
Albumin: 3.2 g/dL — ABNORMAL LOW (ref 3.5–5.0)
Alkaline Phosphatase: 71 U/L (ref 38–126)
Anion gap: 11 (ref 5–15)
BUN: 27 mg/dL — ABNORMAL HIGH (ref 8–23)
CO2: 20 mmol/L — ABNORMAL LOW (ref 22–32)
Calcium: 8.8 mg/dL — ABNORMAL LOW (ref 8.9–10.3)
Chloride: 114 mmol/L — ABNORMAL HIGH (ref 98–111)
Creatinine, Ser: 1.86 mg/dL — ABNORMAL HIGH (ref 0.61–1.24)
GFR, Estimated: 36 mL/min — ABNORMAL LOW (ref >60)
Glucose, Bld: 89 mg/dL (ref 70–99)
Potassium: 4.7 mmol/L (ref 3.5–5.1)
Sodium: 145 mmol/L (ref 135–145)
Total Bilirubin: 0.8 mg/dL (ref 0.0–1.2)
Total Protein: 6.5 g/dL (ref 6.5–8.1)

## 2024-05-17 LAB — BASIC METABOLIC PANEL WITH GFR
Anion gap: 15 (ref 5–15)
BUN: 28 mg/dL — ABNORMAL HIGH (ref 8–23)
CO2: 19 mmol/L — ABNORMAL LOW (ref 22–32)
Calcium: 9.4 mg/dL (ref 8.9–10.3)
Chloride: 112 mmol/L — ABNORMAL HIGH (ref 98–111)
Creatinine, Ser: 1.81 mg/dL — ABNORMAL HIGH (ref 0.61–1.24)
GFR, Estimated: 38 mL/min — ABNORMAL LOW (ref >60)
Glucose, Bld: 104 mg/dL — ABNORMAL HIGH (ref 70–99)
Potassium: 4 mmol/L (ref 3.5–5.1)
Sodium: 146 mmol/L — ABNORMAL HIGH (ref 135–145)

## 2024-05-17 LAB — PROTIME-INR
INR: 1.3 — ABNORMAL HIGH (ref 0.8–1.2)
Prothrombin Time: 16 s — ABNORMAL HIGH (ref 11.4–15.2)

## 2024-05-17 LAB — CBG MONITORING, ED: Glucose-Capillary: 107 mg/dL — ABNORMAL HIGH (ref 70–99)

## 2024-05-17 LAB — I-STAT CG4 LACTIC ACID, ED: Lactic Acid, Venous: 1.6 mmol/L (ref 0.5–1.9)

## 2024-05-17 LAB — TROPONIN I (HIGH SENSITIVITY): Troponin I (High Sensitivity): 38 ng/L — ABNORMAL HIGH (ref <18)

## 2024-05-17 LAB — BRAIN NATRIURETIC PEPTIDE: B Natriuretic Peptide: 249.6 pg/mL — ABNORMAL HIGH (ref 0.0–100.0)

## 2024-05-17 MED ORDER — SODIUM CHLORIDE 0.9 % IV SOLN
2.0000 g | Freq: Once | INTRAVENOUS | Status: AC
  Administered 2024-05-17: 2 g via INTRAVENOUS
  Filled 2024-05-17: qty 12.5

## 2024-05-17 MED ORDER — SODIUM CHLORIDE 0.9 % IV BOLUS
500.0000 mL | Freq: Once | INTRAVENOUS | Status: AC
  Administered 2024-05-17: 500 mL via INTRAVENOUS

## 2024-05-17 MED ORDER — VANCOMYCIN HCL 2000 MG/400ML IV SOLN
2000.0000 mg | Freq: Once | INTRAVENOUS | Status: AC
  Administered 2024-05-18: 2000 mg via INTRAVENOUS
  Filled 2024-05-17: qty 400

## 2024-05-17 MED ORDER — VANCOMYCIN HCL IN DEXTROSE 1-5 GM/200ML-% IV SOLN: 1000.0000 mg | Freq: Once | INTRAVENOUS | Status: DC

## 2024-05-17 MED ORDER — METRONIDAZOLE 500 MG/100ML IV SOLN
500.0000 mg | Freq: Once | INTRAVENOUS | Status: AC
  Administered 2024-05-17: 500 mg via INTRAVENOUS
  Filled 2024-05-17: qty 100

## 2024-05-17 MED ORDER — LACTATED RINGERS IV SOLN: INTRAVENOUS | Status: DC

## 2024-05-17 MED ORDER — SODIUM CHLORIDE 0.9 % IV SOLN: 250.0000 mL | INTRAVENOUS | Status: AC

## 2024-05-17 MED ORDER — NOREPINEPHRINE 4 MG/250ML-% IV SOLN
0.0000 ug/min | INTRAVENOUS | Status: DC
  Administered 2024-05-17: 2 ug/min via INTRAVENOUS

## 2024-05-17 NOTE — ED Provider Notes (Addendum)
 [place] [facility]ERGENCY DEPARTMENT AT [facility] Provider Note   CSN: [id number] Arrival date & time: 05/17/24  2132     History  Chief Complaint  Patient presents with   Respiratory Distress    Paul [name] is a 78 y.o. male.  Patient is a 78 year old male with history of diabetes, CHF, hypothyroidism, coronary artery disease who presents with shortness of breath.  History is limited due to his condition.  Reportedly from family he has been having some shortness of breath over the last few days.  No known fevers.  He was given nebulizer treatments and magnesium by EMS.  He was started on CPAP.  He is denying any chest pain.  He does have some leg swelling which he says is a little worse than normal.       Home Medications Prior to Admission medications   Medication Sig Start Date End Date Taking? Authorizing Provider  albuterol  (PROVENTIL ) (2.5 MG/3ML) 0.083% nebulizer solution Inhale 3 mLs into the lungs 3 (three) times daily as needed for shortness of breath. 05/28/23   [provider]  albuterol  (VENTOLIN  HFA) 108 (90 Base) MCG/ACT inhaler Inhale 2 puffs into the lungs every 6 (six) hours as needed for wheezing or shortness of breath. 03/12/23   Heilingoetter, Cassandra L, PA-C  ammonium lactate (LAC-HYDRIN) 12 % lotion Apply 1 Application topically. 03/02/23   [provider]  aspirin  81 MG chewable tablet Take 81 mg by mouth daily.    [provider]  atorvastatin  (LIPITOR) 80 MG tablet Take 80 mg by mouth daily.    [provider]  Blood Glucose Monitoring Suppl DEVI 1 each by Does not apply route 3 (three) times daily. May dispense any manufacturer covered by patient's insurance. 10/08/23   Rai, Hurman Maiden, MD  carvedilol  (COREG ) 3.125 MG tablet Take 1 tablet (3.125 mg total) by mouth 2 (two) times daily with a meal. 10/12/23   Rai, Ripudeep K, MD  clobetasol cream (TEMOVATE) 0.05 % Apply 1 Application topically 2 (two) times daily  as needed (back pain).    [provider]  empagliflozin  (JARDIANCE ) 10 MG TABS tablet Take 1 tablet (10 mg total) by mouth daily before breakfast. 10/12/23   Rai, Ripudeep K, MD  fluticasone  (FLONASE ) 50 MCG/ACT nasal spray Place 2 sprays into both nostrils daily. 04/13/23   Krishnan, Gokul, MD  Glucose Blood (BLOOD GLUCOSE TEST STRIPS) STRP 1 each by Does not apply route 3 (three) times daily. Use as directed to check blood sugar. May dispense any manufacturer covered by patient's insurance and fits patient's device. 10/08/23   Rai, Hurman Maiden, MD  guaiFENesin  (MUCINEX ) 600 MG 12 hr tablet Take 1 tablet (600 mg total) by mouth 2 (two) times daily. 04/12/23   Krishnan, Gokul, MD  guaiFENesin -dextromethorphan  (ROBITUSSIN DM) 100-10 MG/5ML syrup Take 5 mLs by mouth every 4 (four) hours as needed for cough. 10/12/23   Rai, Hurman Maiden, MD  insulin  glargine-yfgn (SEMGLEE ) 100 UNIT/ML Pen Inject 30 Units into the skin in the morning. 10/12/23   Rai, Ripudeep Linnell Richardson, MD  Insulin  Pen Needle (PEN NEEDLES) 31G X 5 MM MISC 1 each by Does not apply route 3 (three) times daily. May dispense any manufacturer covered by patient's insurance. 10/08/23   Rai, Hurman Maiden, MD  Lancet Device MISC 1 each by Does not apply route 3 (three) times daily. May dispense any manufacturer covered by patient's insurance. 10/08/23   Loma Rising, MD  Lancets MISC 1 each by  Does not apply route 3 (three) times daily. Use as directed to check blood sugar. May dispense any manufacturer covered by patient's insurance and fits patient's device. 10/08/23   Rai, Hurman Maiden, MD  levothyroxine  (SYNTHROID ) 125 MCG tablet Take 1 tablet (125 mcg total) by mouth daily before breakfast. 03/13/23   Heilingoetter, Cassandra L, PA-C  lidocaine -prilocaine  (EMLA ) cream Apply 1 application topically as needed. Patient taking differently: Apply 1 application  topically daily as needed (back pain). 12/10/21   Heilingoetter, Cassandra L, PA-C  metformin   (FORTAMET ) 1000 MG (OSM) 24 hr tablet Take 1 tablet (1,000 mg total) by mouth at bedtime. 10/12/23   Rai, Hurman Maiden, MD  Multiple Vitamins-Minerals ([employer] DAILY MULTIPLE FOR MEN) TABS Take 1 tablet by mouth daily with breakfast.    [provider]  nitroGLYCERIN  (NITROSTAT ) 0.4 MG SL tablet Place 0.4 mg under the tongue every 5 (five) minutes as needed for chest pain. 09/06/18   [provider]  ondansetron  (ZOFRAN ) 4 MG tablet Take 1 tablet (4 mg total) by mouth every 6 (six) hours as needed for nausea or vomiting. 10/08/23   Rai, Ripudeep Linnell Richardson, MD  pantoprazole  (PROTONIX ) 40 MG tablet Take 1 tablet (40 mg total) by mouth daily. 10/08/23   Rai, Hurman Maiden, MD  polyethylene glycol (MIRALAX  / GLYCOLAX ) 17 g packet Take 17 g by mouth daily. 04/13/23   Maylene Spear, MD  sacubitril -valsartan  (ENTRESTO ) 49-51 MG Take 1 tablet by mouth 2 (two) times daily. 10/12/23   Rai, Ripudeep K, MD  senna-docusate (SENOKOT-S) 8.6-50 MG tablet Take 2 tablets by mouth 2 (two) times daily. 04/12/23   Krishnan, Gokul, MD  silver sulfADIAZINE (SILVADENE) 1 % cream Apply 1 Application topically daily as needed (rash).    [provider]  urea (CARMOL) 10 % cream Apply topically. 03/02/23   [provider]      Allergies    Patient has no known allergies.    Review of Systems   Review of Systems  Constitutional:  Positive for fatigue. Negative for chills, diaphoresis and fever.  HENT:  Negative for congestion, rhinorrhea and sneezing.   Eyes: Negative.   Respiratory:  Positive for cough and shortness of breath. Negative for chest tightness.   Cardiovascular:  Positive for leg swelling. Negative for chest pain.  Gastrointestinal:  Negative for abdominal pain, blood in stool, diarrhea, nausea and vomiting.  Genitourinary:  Negative for difficulty urinating, flank pain, frequency and hematuria.  Musculoskeletal:  Negative for arthralgias and back pain.  Skin:  Negative for rash.   Neurological:  Negative for dizziness, speech difficulty, weakness, numbness and headaches.    Physical Exam Updated Vital Signs BP (!) 79/56   Pulse (!) 48   Temp (!) 95 F (35 C) (Rectal)   Resp 13   Ht 5\' 8"  (1.727 m)   Wt 105 kg   SpO2 100%   BMI 35.20 kg/m  Physical Exam Constitutional:      General: He is in acute distress.     Appearance: He is well-developed. He is ill-appearing.  HENT:     Head: Normocephalic and atraumatic.  Eyes:     Pupils: Pupils are equal, round, and reactive to light.  Cardiovascular:     Rate and Rhythm: Regular rhythm. Bradycardia present.     Heart sounds: Normal heart sounds.  Pulmonary:     Effort: Respiratory distress present.     Breath sounds: Normal breath sounds. No wheezing or rales.  Chest:  Chest wall: No tenderness.  Abdominal:     General: Bowel sounds are normal.     Palpations: Abdomen is soft.     Tenderness: There is no abdominal tenderness. There is no guarding or rebound.  Musculoskeletal:        General: Normal range of motion.     Cervical back: Normal range of motion and neck supple.     Comments: 2+ edema to the lower extremities bilaterally  Lymphadenopathy:     Cervical: No cervical adenopathy.  Skin:    General: Skin is warm and dry.     Findings: No rash.  Neurological:     Mental Status: He is alert and oriented to person, place, and time.     ED Results / Procedures / Treatments   Labs (all labs ordered are listed, but only abnormal results are displayed) Labs Reviewed  BASIC METABOLIC PANEL WITH GFR - Abnormal; Notable for the following components:      Result Value   Sodium 146 (*)    Chloride 112 (*)    CO2 19 (*)    Glucose, Bld 104 (*)    BUN 28 (*)    Creatinine, Ser 1.81 (*)    GFR, Estimated 38 (*)    All other components within normal limits  BRAIN NATRIURETIC PEPTIDE - Abnormal; Notable for the following components:   B Natriuretic Peptide 249.6 (*)    All other components  within normal limits  CBC WITH DIFFERENTIAL/PLATELET - Abnormal; Notable for the following components:   RBC 3.79 (*)    Hemoglobin 12.0 (*)    HCT 37.7 (*)    RDW 18.2 (*)    nRBC 0.4 (*)    All other components within normal limits  COMPREHENSIVE METABOLIC PANEL WITH GFR - Abnormal; Notable for the following components:   Chloride 114 (*)    CO2 20 (*)    BUN 27 (*)    Creatinine, Ser 1.86 (*)    Calcium  8.8 (*)    Albumin 3.2 (*)    AST 47 (*)    GFR, Estimated 36 (*)    All other components within normal limits  PROTIME-INR - Abnormal; Notable for the following components:   Prothrombin Time 16.0 (*)    INR 1.3 (*)    All other components within normal limits  I-STAT ARTERIAL BLOOD GAS, ED - Abnormal; Notable for the following components:   pH, Arterial 7.328 (*)    pCO2 arterial 52.0 (*)    pO2, Arterial 212 (*)    Sodium 147 (*)    HCT 31.0 (*)    Hemoglobin 10.5 (*)    All other components within normal limits  CBG MONITORING, ED - Abnormal; Notable for the following components:   Glucose-Capillary 107 (*)    All other components within normal limits  TROPONIN I (HIGH SENSITIVITY) - Abnormal; Notable for the following components:   Troponin I (High Sensitivity) 38 (*)    All other components within normal limits  CULTURE, BLOOD (ROUTINE X 2)  CULTURE, BLOOD (ROUTINE X 2)  MRSA NEXT GEN BY PCR, NASAL  CBC WITH DIFFERENTIAL/PLATELET  URINALYSIS, W/ REFLEX TO CULTURE (INFECTION SUSPECTED)  TSH  CBC  BASIC METABOLIC PANEL WITH GFR  BLOOD GAS, ARTERIAL  MAGNESIUM  PHOSPHORUS  I-STAT CG4 LACTIC ACID, ED  I-STAT CG4 LACTIC ACID, ED  TROPONIN I (HIGH SENSITIVITY)    EKG None  Radiology DG Chest Portable 1 View Result Date: 05/17/2024 CLINICAL DATA:  Respiratory  distress EXAM: PORTABLE CHEST 1 VIEW COMPARISON:  10/08/2023 FINDINGS: Cardiac shadow is enlarged. Postsurgical changes are noted. The overall inspiratory effort is poor. Central vascular congestion is  noted with mild changes of edema. IMPRESSION: Mild changes of CHF. Electronically Signed   By: Violeta Grey M.D.   On: 05/17/2024 22:51    Procedures Procedures    Medications Ordered in ED Medications  lactated ringers  infusion (has no administration in time range)  metroNIDAZOLE (FLAGYL) IVPB 500 mg (500 mg Intravenous New Bag/Given 05/17/24 2357)  0.9 %  sodium chloride  infusion (has no administration in time range)  norepinephrine (LEVOPHED) 4mg  in (0.016 mg/mL) premix infusion (10 mcg/min Intravenous Rate/Dose Change 05/17/24 2331)  vancomycin (VANCOREADY) IVPB 2000 mg/400 mL (has no administration in time range)  docusate sodium  (COLACE) capsule 100 mg (has no administration in time range)  polyethylene glycol (MIRALAX  / GLYCOLAX ) packet 17 g (has no administration in time range)  heparin  injection 5,000 Units (has no administration in time range)  sodium chloride  0.9 % bolus 500 mL (0 mLs Intravenous Stopped 05/17/24 2232)  ceFEPIme (MAXIPIME) 2 g in sodium chloride  0.9 % 100 mL IVPB (0 g Intravenous Stopped 05/17/24 2349)  sodium chloride  0.9 % bolus 500 mL (0 mLs Intravenous Stopped 05/17/24 2323)    ED Course/ Medical Decision Making/ A&P                                 Medical Decision Making Amount and/or Complexity of Data Reviewed Labs: ordered. Radiology: ordered.  Risk Prescription drug management. Decision regarding hospitalization.   Patient is a 78 year old who presents with respiratory distress.  He was maintained on BiPAP.  He was very sedated but he would wake up and tell me his name so initially was not intubated.  His blood pressure was noted to be soft with a systolic blood pressure around 100.  His chest x-ray showed some mild edema but otherwise no acute abnormality.  He was noted to be hypothermic with a rectal temperature of 95.  His blood pressure diminished and became 55-60 systolic.  He was continued on IV fluids and started on Levophed.  He would  periodically become bradycardic but then his heart rate would go up into the 70s.  It would fluctuate between the 40s and 70s.  Appears to be a sinus rhythm on his EKG.  His lactate is normal.  His white count is normal.  He was put on a [employer]  TSH is pending.  He was started on Levophed for his hypotension.  He was treated for with IV antibiotics for possible sepsis.  Discussed with the intensivist who will see the patient for admission.  CRITICAL CARE Performed by: Hershel Los Total critical care time: 80 minutes Critical care time was exclusive of separately billable procedures and treating other patients. Critical care was necessary to treat or prevent imminent or life-threatening deterioration. Critical care was time spent personally by me on the following activities: development of treatment plan with patient and/or surrogate as well as nursing, discussions with consultants, evaluation of patient's response to treatment, examination of patient, obtaining history from patient or surrogate, ordering and performing treatments and interventions, ordering and review of laboratory studies, ordering and review of radiographic studies, pulse oximetry and re-evaluation of patient's condition.   Final Clinical Impression(s) / ED Diagnoses Final diagnoses:  Acute respiratory failure with hypoxia (HCC)  Cardiogenic shock (HCC)  Rx / DC Orders ED Discharge Orders     None         Hershel Los, MD 05/17/24 9528    Hershel Los, MD 05/18/24 0002

## 2024-05-17 NOTE — ED Notes (Signed)
 Olene Berne PA at the bedside

## 2024-05-17 NOTE — ED Notes (Signed)
Critical care at the bedside. 

## 2024-05-17 NOTE — ED Triage Notes (Signed)
 Pt BIB [facility] from home. Pt c/o SOB x2 days; pt reports taking "fluid pill" prior to EMS arrival. Pt was initially 87% on RA,  O2 came up to 100% with CPAP.  EMS administered 2g Mag, 125 Solumedrol  and 1 Neb

## 2024-05-17 NOTE — Sepsis Progress Note (Signed)
 Elink monitoring for the code sepsis protocol.

## 2024-05-18 ENCOUNTER — Inpatient Hospital Stay (HOSPITAL_COMMUNITY)

## 2024-05-18 DIAGNOSIS — N170 Acute kidney failure with tubular necrosis: Secondary | ICD-10-CM | POA: Diagnosis present

## 2024-05-18 DIAGNOSIS — I130 Hypertensive heart and chronic kidney disease with heart failure and stage 1 through stage 4 chronic kidney disease, or unspecified chronic kidney disease: Secondary | ICD-10-CM | POA: Diagnosis present

## 2024-05-18 DIAGNOSIS — I2510 Atherosclerotic heart disease of native coronary artery without angina pectoris: Secondary | ICD-10-CM | POA: Diagnosis present

## 2024-05-18 DIAGNOSIS — J440 Chronic obstructive pulmonary disease with acute lower respiratory infection: Secondary | ICD-10-CM | POA: Diagnosis present

## 2024-05-18 DIAGNOSIS — I959 Hypotension, unspecified: Secondary | ICD-10-CM

## 2024-05-18 DIAGNOSIS — R578 Other shock: Secondary | ICD-10-CM | POA: Diagnosis not present

## 2024-05-18 DIAGNOSIS — N1832 Chronic kidney disease, stage 3b: Secondary | ICD-10-CM | POA: Diagnosis present

## 2024-05-18 DIAGNOSIS — E11649 Type 2 diabetes mellitus with hypoglycemia without coma: Secondary | ICD-10-CM | POA: Diagnosis present

## 2024-05-18 DIAGNOSIS — E119 Type 2 diabetes mellitus without complications: Secondary | ICD-10-CM

## 2024-05-18 DIAGNOSIS — E1122 Type 2 diabetes mellitus with diabetic chronic kidney disease: Secondary | ICD-10-CM | POA: Diagnosis present

## 2024-05-18 DIAGNOSIS — R4182 Altered mental status, unspecified: Secondary | ICD-10-CM

## 2024-05-18 DIAGNOSIS — A419 Sepsis, unspecified organism: Secondary | ICD-10-CM | POA: Diagnosis not present

## 2024-05-18 DIAGNOSIS — E66812 Obesity, class 2: Secondary | ICD-10-CM | POA: Diagnosis present

## 2024-05-18 DIAGNOSIS — E039 Hypothyroidism, unspecified: Secondary | ICD-10-CM

## 2024-05-18 DIAGNOSIS — J90 Pleural effusion, not elsewhere classified: Secondary | ICD-10-CM | POA: Diagnosis not present

## 2024-05-18 DIAGNOSIS — J9602 Acute respiratory failure with hypercapnia: Secondary | ICD-10-CM | POA: Diagnosis present

## 2024-05-18 DIAGNOSIS — Z794 Long term (current) use of insulin: Secondary | ICD-10-CM | POA: Diagnosis not present

## 2024-05-18 DIAGNOSIS — G9340 Encephalopathy, unspecified: Secondary | ICD-10-CM | POA: Diagnosis not present

## 2024-05-18 DIAGNOSIS — F17210 Nicotine dependence, cigarettes, uncomplicated: Secondary | ICD-10-CM | POA: Diagnosis present

## 2024-05-18 DIAGNOSIS — C3490 Malignant neoplasm of unspecified part of unspecified bronchus or lung: Secondary | ICD-10-CM | POA: Diagnosis present

## 2024-05-18 DIAGNOSIS — J189 Pneumonia, unspecified organism: Secondary | ICD-10-CM

## 2024-05-18 DIAGNOSIS — J14 Pneumonia due to Hemophilus influenzae: Secondary | ICD-10-CM | POA: Diagnosis present

## 2024-05-18 DIAGNOSIS — J9601 Acute respiratory failure with hypoxia: Secondary | ICD-10-CM

## 2024-05-18 DIAGNOSIS — G9341 Metabolic encephalopathy: Secondary | ICD-10-CM | POA: Diagnosis present

## 2024-05-18 DIAGNOSIS — J700 Acute pulmonary manifestations due to radiation: Secondary | ICD-10-CM | POA: Diagnosis present

## 2024-05-18 DIAGNOSIS — A413 Sepsis due to Hemophilus influenzae: Secondary | ICD-10-CM | POA: Diagnosis present

## 2024-05-18 DIAGNOSIS — I5023 Acute on chronic systolic (congestive) heart failure: Secondary | ICD-10-CM | POA: Diagnosis present

## 2024-05-18 DIAGNOSIS — E035 Myxedema coma: Secondary | ICD-10-CM | POA: Diagnosis present

## 2024-05-18 DIAGNOSIS — R739 Hyperglycemia, unspecified: Secondary | ICD-10-CM | POA: Diagnosis not present

## 2024-05-18 DIAGNOSIS — R579 Shock, unspecified: Secondary | ICD-10-CM

## 2024-05-18 DIAGNOSIS — F4310 Post-traumatic stress disorder, unspecified: Secondary | ICD-10-CM | POA: Diagnosis present

## 2024-05-18 DIAGNOSIS — R570 Cardiogenic shock: Secondary | ICD-10-CM | POA: Diagnosis present

## 2024-05-18 DIAGNOSIS — E1165 Type 2 diabetes mellitus with hyperglycemia: Secondary | ICD-10-CM | POA: Diagnosis present

## 2024-05-18 DIAGNOSIS — R6521 Severe sepsis with septic shock: Secondary | ICD-10-CM | POA: Diagnosis present

## 2024-05-18 DIAGNOSIS — G4733 Obstructive sleep apnea (adult) (pediatric): Secondary | ICD-10-CM | POA: Diagnosis present

## 2024-05-18 DIAGNOSIS — R680 Hypothermia, not associated with low environmental temperature: Secondary | ICD-10-CM | POA: Diagnosis not present

## 2024-05-18 DIAGNOSIS — J918 Pleural effusion in other conditions classified elsewhere: Secondary | ICD-10-CM | POA: Diagnosis present

## 2024-05-18 DIAGNOSIS — N179 Acute kidney failure, unspecified: Secondary | ICD-10-CM

## 2024-05-18 LAB — COOXEMETRY PANEL
Carboxyhemoglobin: 3.6 % — ABNORMAL HIGH (ref 0.5–1.5)
Carboxyhemoglobin: 1.9 % — ABNORMAL HIGH (ref 0.5–1.5)
Methemoglobin: <0.7 % (ref 0.0–1.5)
Methemoglobin: <0.7 % (ref 0.0–1.5)
O2 Saturation: 76.6 %
O2 Saturation: 67.4 %
Total hemoglobin: 10.4 g/dL — ABNORMAL LOW (ref 12.0–16.0)
Total hemoglobin: 10.8 g/dL — ABNORMAL LOW (ref 12.0–16.0)

## 2024-05-18 LAB — CBC
HCT: 32.4 % — ABNORMAL LOW (ref 39.0–52.0)
HCT: 32.5 % — ABNORMAL LOW (ref 39.0–52.0)
HCT: 32.5 % — ABNORMAL LOW (ref 39.0–52.0)
Hemoglobin: 10.2 g/dL — ABNORMAL LOW (ref 13.0–17.0)
Hemoglobin: 10.2 g/dL — ABNORMAL LOW (ref 13.0–17.0)
Hemoglobin: 10.2 g/dL — ABNORMAL LOW (ref 13.0–17.0)
MCH: 31.1 pg (ref 26.0–34.0)
MCH: 31.1 pg (ref 26.0–34.0)
MCH: 31.3 pg (ref 26.0–34.0)
MCHC: 31.4 g/dL (ref 30.0–36.0)
MCHC: 31.4 g/dL (ref 30.0–36.0)
MCHC: 31.5 g/dL (ref 30.0–36.0)
MCV: 98.8 fL (ref 80.0–100.0)
MCV: 99.1 fL (ref 80.0–100.0)
MCV: 99.7 fL (ref 80.0–100.0)
Platelets: 152 10*3/uL (ref 150–400)
Platelets: 171 10*3/uL (ref 150–400)
Platelets: 180 10*3/uL (ref 150–400)
RBC: 3.26 MIL/uL — ABNORMAL LOW (ref 4.22–5.81)
RBC: 3.28 MIL/uL — ABNORMAL LOW (ref 4.22–5.81)
RBC: 3.28 MIL/uL — ABNORMAL LOW (ref 4.22–5.81)
RDW: 17.8 % — ABNORMAL HIGH (ref 11.5–15.5)
RDW: 17.9 % — ABNORMAL HIGH (ref 11.5–15.5)
RDW: 18 % — ABNORMAL HIGH (ref 11.5–15.5)
WBC: 4.5 10*3/uL (ref 4.0–10.5)
WBC: 4.6 10*3/uL (ref 4.0–10.5)
WBC: 4.8 10*3/uL (ref 4.0–10.5)
nRBC: 0 % (ref 0.0–0.2)
nRBC: 0 % (ref 0.0–0.2)
nRBC: 0 % (ref 0.0–0.2)

## 2024-05-18 LAB — ECHOCARDIOGRAM COMPLETE
Area-P 1/2: 4.08 cm2
Calc EF: 18.6 %
Est EF: <20
Height: 68 in
S' Lateral: 5.8 cm
Single Plane A2C EF: 19.5 %
Single Plane A4C EF: 15.7 %
Weight: 3809.55 [oz_av]

## 2024-05-18 LAB — POCT I-STAT 7, (LYTES, BLD GAS, ICA,H+H)
Acid-Base Excess: 1 mmol/L (ref 0.0–2.0)
Bicarbonate: 24.7 mmol/L (ref 20.0–28.0)
Calcium, Ion: 1.29 mmol/L (ref 1.15–1.40)
HCT: 29 % — ABNORMAL LOW (ref 39.0–52.0)
Hemoglobin: 9.9 g/dL — ABNORMAL LOW (ref 13.0–17.0)
O2 Saturation: 100 %
Patient temperature: 95
Potassium: 4.2 mmol/L (ref 3.5–5.1)
Sodium: 145 mmol/L (ref 135–145)
TCO2: 26 mmol/L (ref 22–32)
pCO2 arterial: 32.6 mmHg (ref 32–48)
pH, Arterial: 7.48 — ABNORMAL HIGH (ref 7.35–7.45)
pO2, Arterial: 373 mmHg — ABNORMAL HIGH (ref 83–108)

## 2024-05-18 LAB — TSH: TSH: 73.102 u[IU]/mL — ABNORMAL HIGH (ref 0.350–4.500)

## 2024-05-18 LAB — MRSA NEXT GEN BY PCR, NASAL: MRSA by PCR Next Gen: NOT DETECTED

## 2024-05-18 LAB — GLUCOSE, CAPILLARY
Glucose-Capillary: 103 mg/dL — ABNORMAL HIGH (ref 70–99)
Glucose-Capillary: 113 mg/dL — ABNORMAL HIGH (ref 70–99)
Glucose-Capillary: 133 mg/dL — ABNORMAL HIGH (ref 70–99)
Glucose-Capillary: 142 mg/dL — ABNORMAL HIGH (ref 70–99)
Glucose-Capillary: 171 mg/dL — ABNORMAL HIGH (ref 70–99)
Glucose-Capillary: 171 mg/dL — ABNORMAL HIGH (ref 70–99)
Glucose-Capillary: 195 mg/dL — ABNORMAL HIGH (ref 70–99)

## 2024-05-18 LAB — URINALYSIS, ROUTINE W REFLEX MICROSCOPIC
Bilirubin Urine: NEGATIVE
Glucose, UA: NEGATIVE mg/dL
Hgb urine dipstick: NEGATIVE
Ketones, ur: NEGATIVE mg/dL
Nitrite: NEGATIVE
Protein, ur: 30 mg/dL — AB
Specific Gravity, Urine: 1.018 (ref 1.005–1.030)
pH: 5 (ref 5.0–8.0)

## 2024-05-18 LAB — HEMOGLOBIN A1C
Hgb A1c MFr Bld: 8.5 % — ABNORMAL HIGH (ref 4.8–5.6)
Mean Plasma Glucose: 197.25 mg/dL

## 2024-05-18 LAB — LACTIC ACID, PLASMA
Lactic Acid, Venous: 1.5 mmol/L (ref 0.5–1.9)
Lactic Acid, Venous: 1.6 mmol/L (ref 0.5–1.9)

## 2024-05-18 LAB — BASIC METABOLIC PANEL WITH GFR
Anion gap: 7 (ref 5–15)
Anion gap: 7 (ref 5–15)
BUN: 28 mg/dL — ABNORMAL HIGH (ref 8–23)
BUN: 29 mg/dL — ABNORMAL HIGH (ref 8–23)
CO2: 25 mmol/L (ref 22–32)
CO2: 26 mmol/L (ref 22–32)
Calcium: 8.5 mg/dL — ABNORMAL LOW (ref 8.9–10.3)
Calcium: 9.1 mg/dL (ref 8.9–10.3)
Chloride: 112 mmol/L — ABNORMAL HIGH (ref 98–111)
Chloride: 114 mmol/L — ABNORMAL HIGH (ref 98–111)
Creatinine, Ser: 1.91 mg/dL — ABNORMAL HIGH (ref 0.61–1.24)
Creatinine, Ser: 2.08 mg/dL — ABNORMAL HIGH (ref 0.61–1.24)
GFR, Estimated: 32 mL/min — ABNORMAL LOW (ref >60)
GFR, Estimated: 35 mL/min — ABNORMAL LOW (ref >60)
Glucose, Bld: 127 mg/dL — ABNORMAL HIGH (ref 70–99)
Glucose, Bld: 96 mg/dL (ref 70–99)
Potassium: 4.3 mmol/L (ref 3.5–5.1)
Potassium: 4.6 mmol/L (ref 3.5–5.1)
Sodium: 144 mmol/L (ref 135–145)
Sodium: 147 mmol/L — ABNORMAL HIGH (ref 135–145)

## 2024-05-18 LAB — STREP PNEUMONIAE URINARY ANTIGEN: Strep Pneumo Urinary Antigen: NEGATIVE

## 2024-05-18 LAB — MAGNESIUM
Magnesium: 2.2 mg/dL (ref 1.7–2.4)
Magnesium: 2.5 mg/dL — ABNORMAL HIGH (ref 1.7–2.4)
Magnesium: 2.5 mg/dL — ABNORMAL HIGH (ref 1.7–2.4)

## 2024-05-18 LAB — PHOSPHORUS
Phosphorus: 3.3 mg/dL (ref 2.5–4.6)
Phosphorus: 3.6 mg/dL (ref 2.5–4.6)
Phosphorus: 4.2 mg/dL (ref 2.5–4.6)

## 2024-05-18 LAB — I-STAT CG4 LACTIC ACID, ED: Lactic Acid, Venous: 1.8 mmol/L (ref 0.5–1.9)

## 2024-05-18 LAB — TROPONIN I (HIGH SENSITIVITY): Troponin I (High Sensitivity): 40 ng/L — ABNORMAL HIGH (ref <18)

## 2024-05-18 LAB — CREATININE, SERUM
Creatinine, Ser: 1.96 mg/dL — ABNORMAL HIGH (ref 0.61–1.24)
GFR, Estimated: 34 mL/min — ABNORMAL LOW (ref >60)

## 2024-05-18 MED ORDER — VITAL HIGH PROTEIN PO LIQD
1000.0000 mL | ORAL | Status: DC
  Administered 2024-05-18 – 2024-05-19 (×2): 1000 mL

## 2024-05-18 MED ORDER — CHLORHEXIDINE GLUCONATE CLOTH 2 % EX PADS
6.0000 | MEDICATED_PAD | Freq: Every day | CUTANEOUS | Status: DC
  Administered 2024-05-18 – 2024-05-21 (×4): 6 via TOPICAL

## 2024-05-18 MED ORDER — HYDROCORTISONE SOD SUC (PF) 100 MG IJ SOLR
100.0000 mg | Freq: Three times a day (TID) | INTRAMUSCULAR | Status: DC
  Administered 2024-05-18 – 2024-05-20 (×8): 100 mg via INTRAVENOUS
  Filled 2024-05-18 (×8): qty 2

## 2024-05-18 MED ORDER — PERFLUTREN LIPID MICROSPHERE
1.0000 mL | INTRAVENOUS | Status: AC | PRN
  Administered 2024-05-18: 3 mL via INTRAVENOUS

## 2024-05-18 MED ORDER — SODIUM CHLORIDE 0.9 % IV SOLN
500.0000 mg | INTRAVENOUS | Status: DC
  Administered 2024-05-18 – 2024-05-21 (×4): 500 mg via INTRAVENOUS
  Filled 2024-05-18 (×4): qty 5

## 2024-05-18 MED ORDER — ORAL CARE MOUTH RINSE: 15.0000 mL | OROMUCOSAL | Status: DC | PRN

## 2024-05-18 MED ORDER — FENTANYL BOLUS VIA INFUSION
25.0000 ug | INTRAVENOUS | Status: DC | PRN
  Administered 2024-05-18: 50 ug via INTRAVENOUS
  Administered 2024-05-19: 25 ug via INTRAVENOUS
  Administered 2024-05-19 – 2024-05-20 (×3): 50 ug via INTRAVENOUS

## 2024-05-18 MED ORDER — MIDAZOLAM HCL 2 MG/2ML IJ SOLN
2.0000 mg | Freq: Once | INTRAMUSCULAR | Status: AC
  Administered 2024-05-18: 2 mg via INTRAVENOUS

## 2024-05-18 MED ORDER — ATROPINE SULFATE 1 MG/10ML IJ SOSY
PREFILLED_SYRINGE | INTRAMUSCULAR | Status: AC
  Filled 2024-05-18: qty 20

## 2024-05-18 MED ORDER — FENTANYL 2500MCG IN NS 250ML (10MCG/ML) PREMIX INFUSION
0.0000 ug/h | INTRAVENOUS | Status: DC
  Administered 2024-05-18: 125 ug/h via INTRAVENOUS
  Administered 2024-05-18: 50 ug/h via INTRAVENOUS
  Administered 2024-05-19: 125 ug/h via INTRAVENOUS
  Filled 2024-05-18 (×3): qty 250

## 2024-05-18 MED ORDER — LEVOTHYROXINE SODIUM 100 MCG/5ML IV SOLN
100.0000 ug | Freq: Every day | INTRAVENOUS | Status: DC
  Administered 2024-05-19 – 2024-05-21 (×3): 100 ug via INTRAVENOUS
  Filled 2024-05-18 (×3): qty 5

## 2024-05-18 MED ORDER — FAMOTIDINE 20 MG PO TABS
20.0000 mg | ORAL_TABLET | Freq: Two times a day (BID) | ORAL | Status: DC
  Administered 2024-05-18 – 2024-05-19 (×4): 20 mg
  Filled 2024-05-18 (×4): qty 1

## 2024-05-18 MED ORDER — VASOPRESSIN 20 UNITS/100 ML INFUSION FOR SHOCK
0.0000 [IU]/min | INTRAVENOUS | Status: DC
  Administered 2024-05-18 – 2024-05-19 (×2): 0.03 [IU]/min via INTRAVENOUS
  Filled 2024-05-18 (×3): qty 100

## 2024-05-18 MED ORDER — DOCUSATE SODIUM 100 MG PO CAPS: 100.0000 mg | ORAL_CAPSULE | Freq: Two times a day (BID) | ORAL | Status: DC | PRN

## 2024-05-18 MED ORDER — HEPARIN SODIUM (PORCINE) 5000 UNIT/ML IJ SOLN
5000.0000 [IU] | Freq: Three times a day (TID) | INTRAMUSCULAR | Status: DC
  Administered 2024-05-18 – 2024-05-25 (×22): 5000 [IU] via SUBCUTANEOUS
  Filled 2024-05-18 (×22): qty 1

## 2024-05-18 MED ORDER — ETOMIDATE 2 MG/ML IV SOLN
20.0000 mg | Freq: Once | INTRAVENOUS | Status: AC
  Administered 2024-05-18: 20 mg via INTRAVENOUS

## 2024-05-18 MED ORDER — NOREPINEPHRINE 16 MG/250ML-% IV SOLN
0.0000 ug/min | INTRAVENOUS | Status: DC
  Administered 2024-05-18: 2 ug/min via INTRAVENOUS
  Administered 2024-05-18: 20 ug/min via INTRAVENOUS
  Administered 2024-05-19: 7 ug/min via INTRAVENOUS
  Filled 2024-05-18 (×3): qty 250

## 2024-05-18 MED ORDER — PROSOURCE TF20 ENFIT COMPATIBL EN LIQD
60.0000 mL | Freq: Every day | ENTERAL | Status: DC
  Administered 2024-05-18 – 2024-05-20 (×3): 60 mL
  Filled 2024-05-18 (×3): qty 60

## 2024-05-18 MED ORDER — ORAL CARE MOUTH RINSE
15.0000 mL | OROMUCOSAL | Status: DC
  Administered 2024-05-18 – 2024-05-21 (×36): 15 mL via OROMUCOSAL

## 2024-05-18 MED ORDER — PHENYLEPHRINE 80 MCG/ML (10ML) SYRINGE FOR IV PUSH (FOR BLOOD PRESSURE SUPPORT)
PREFILLED_SYRINGE | INTRAVENOUS | Status: AC
  Administered 2024-05-18: 800 ug via INTRAVENOUS
  Filled 2024-05-18: qty 10

## 2024-05-18 MED ORDER — FENTANYL CITRATE PF 50 MCG/ML IJ SOSY: 25.0000 ug | PREFILLED_SYRINGE | Freq: Once | INTRAMUSCULAR | Status: DC

## 2024-05-18 MED ORDER — ONDANSETRON HCL 4 MG/2ML IJ SOLN: 4.0000 mg | Freq: Four times a day (QID) | INTRAMUSCULAR | Status: DC | PRN

## 2024-05-18 MED ORDER — HEPARIN SODIUM (PORCINE) 5000 UNIT/ML IJ SOLN: 5000.0000 [IU] | Freq: Three times a day (TID) | INTRAMUSCULAR | Status: DC

## 2024-05-18 MED ORDER — SODIUM CHLORIDE 0.9 % IV SOLN
2.0000 g | INTRAVENOUS | Status: DC
  Administered 2024-05-18 – 2024-05-24 (×7): 2 g via INTRAVENOUS
  Filled 2024-05-18 (×7): qty 20

## 2024-05-18 MED ORDER — INSULIN ASPART 100 UNIT/ML IJ SOLN
0.0000 [IU] | INTRAMUSCULAR | Status: DC
  Administered 2024-05-18: 3 [IU] via SUBCUTANEOUS
  Administered 2024-05-18: 2 [IU] via SUBCUTANEOUS
  Administered 2024-05-18 – 2024-05-19 (×4): 3 [IU] via SUBCUTANEOUS
  Administered 2024-05-19 (×3): 5 [IU] via SUBCUTANEOUS
  Administered 2024-05-20: 3 [IU] via SUBCUTANEOUS
  Administered 2024-05-20: 8 [IU] via SUBCUTANEOUS
  Administered 2024-05-20: 5 [IU] via SUBCUTANEOUS
  Administered 2024-05-20: 8 [IU] via SUBCUTANEOUS

## 2024-05-18 MED ORDER — LACTATED RINGERS IV BOLUS
500.0000 mL | Freq: Once | INTRAVENOUS | Status: AC
  Administered 2024-05-18: 500 mL via INTRAVENOUS

## 2024-05-18 MED ORDER — LEVOTHYROXINE SODIUM 100 MCG/5ML IV SOLN
200.0000 ug | Freq: Once | INTRAVENOUS | Status: AC
  Administered 2024-05-18: 200 ug via INTRAVENOUS
  Filled 2024-05-18: qty 10

## 2024-05-18 MED ORDER — SODIUM CHLORIDE 0.9 % IV SOLN
1.0000 g | INTRAVENOUS | Status: DC
  Filled 2024-05-18: qty 10

## 2024-05-18 MED ORDER — NOREPINEPHRINE 4 MG/250ML-% IV SOLN: 0.0000 ug/min | INTRAVENOUS | Status: DC

## 2024-05-18 MED ORDER — ROCURONIUM BROMIDE 10 MG/ML (PF) SYRINGE
50.0000 mg | PREFILLED_SYRINGE | Freq: Once | INTRAVENOUS | Status: AC
  Administered 2024-05-18: 50 mg via INTRAVENOUS

## 2024-05-18 MED ORDER — MIDAZOLAM HCL 2 MG/2ML IJ SOLN
2.0000 mg | Freq: Once | INTRAMUSCULAR | Status: AC
  Filled 2024-05-18: qty 2

## 2024-05-18 MED ORDER — SODIUM CHLORIDE 0.9 % IV SOLN
250.0000 mL | INTRAVENOUS | Status: AC
  Administered 2024-05-18: 250 mL via INTRAVENOUS

## 2024-05-18 MED ORDER — POLYETHYLENE GLYCOL 3350 17 G PO PACK: 17.0000 g | PACK | Freq: Every day | ORAL | Status: DC | PRN

## 2024-05-18 NOTE — ED Notes (Signed)
 Per Critical Care, transport pt to 2H before getting the head CT.

## 2024-05-18 NOTE — Progress Notes (Addendum)
 Low UOP during day shift Giving 500cc LR bolus  Joesph Mussel, DO 05/18/24 3:45 PM [facility]  For contact information, see Amion. If no response to pager, please call PCCM consult pager. After hours, 7PM- 7AM, please call Elink.    Persistently high pressor requirements today. Vaso ordered but not running-- asked  RN to start this.   Echo:  LVEF <20% with WMA, severely reduced RV function. RA severely dilated (mildly dilated on previous echos).  Checking coox and LA; LA at admission was normal x 2.  Joesph Mussel, DO 05/18/24 6:29 PM [facility]

## 2024-05-18 NOTE — Progress Notes (Signed)
  Echocardiogram 2D Echocardiogram has been performed.  Fain Home RDCS 05/18/2024, 10:19 AM

## 2024-05-18 NOTE — Plan of Care (Signed)
  Problem: Education: Goal: Ability to describe self-care measures that may prevent or decrease complications (Diabetes Survival Skills Education) will improve Outcome: Not Applicable Goal: Individualized Educational Video(s) Outcome: Not Applicable   Problem: Coping: Goal: Ability to adjust to condition or change in health will improve Outcome: Not Applicable   Problem: Fluid Volume: Goal: Ability to maintain a balanced intake and output will improve Outcome: Not Applicable   Problem: Health Behavior/Discharge Planning: Goal: Ability to identify and utilize available resources and services will improve Outcome: Not Applicable Goal: Ability to manage health-related needs will improve Outcome: Not Applicable   Problem: Metabolic: Goal: Ability to maintain appropriate glucose levels will improve Outcome: Not Applicable   Problem: Nutritional: Goal: Maintenance of adequate nutrition will improve Outcome: Not Applicable Goal: Progress toward achieving an optimal weight will improve Outcome: Not Applicable   Problem: Skin Integrity: Goal: Risk for impaired skin integrity will decrease Outcome: Not Applicable   Problem: Tissue Perfusion: Goal: Adequacy of tissue perfusion will improve Outcome: Not Applicable   Problem: Education: Goal: Knowledge of General Education information will improve Description: Including pain rating scale, medication(s)/side effects and non-pharmacologic comfort measures Outcome: Not Applicable   Problem: Health Behavior/Discharge Planning: Goal: Ability to manage health-related needs will improve Outcome: Not Applicable   Problem: Clinical Measurements: Goal: Ability to maintain clinical measurements within normal limits will improve Outcome: Not Applicable Goal: Will remain free from infection Outcome: Not Applicable Goal: Diagnostic test results will improve Outcome: Not Applicable Goal: Respiratory complications will improve Outcome: Not  Applicable Goal: Cardiovascular complication will be avoided Outcome: Not Applicable   Problem: Activity: Goal: Risk for activity intolerance will decrease Outcome: Not Applicable   Problem: Nutrition: Goal: Adequate nutrition will be maintained Outcome: Not Applicable   Problem: Coping: Goal: Level of anxiety will decrease Outcome: Not Applicable   Problem: Elimination: Goal: Will not experience complications related to bowel motility Outcome: Not Applicable Goal: Will not experience complications related to urinary retention Outcome: Not Applicable   Problem: Pain Managment: Goal: General experience of comfort will improve and/or be controlled Outcome: Not Applicable   Problem: Safety: Goal: Ability to remain free from injury will improve Outcome: Not Applicable   Problem: Skin Integrity: Goal: Risk for impaired skin integrity will decrease Outcome: Not Applicable   Problem: Activity: Goal: Ability to tolerate increased activity will improve Outcome: Not Applicable   Problem: Respiratory: Goal: Ability to maintain a clear airway and adequate ventilation will improve Outcome: Not Applicable   Problem: Role Relationship: Goal: Method of communication will improve Outcome: Not Applicable

## 2024-05-18 NOTE — Progress Notes (Signed)
 eLink Physician-Brief Progress Note Patient Name: Paul [name] DOB: Jun 10, 1946 MRN: [id number]   Date of Service  05/18/2024  HPI/Events of Note  78 year old male with a history of heart failure with reduced ejection fraction, lung cancer, smoker, CKD, and diabetes who presents with dyspnea and to have sepsis secondary to lower lobe pneumonia complicated by acute hypoxic hypercapnic respiratory failure and associated heart failure exacerbation.  Vital signs, ventilator mechanics, and laboratory studies reviewed.  Imaging reviewed.  eICU Interventions  Maintain mechanical ventilation, daily spontaneous awakening and breathing trials.  Add short duration SVNs.  Empiric antibiotics in place  Norepinephrine as needed to maintain MAP greater than 65  DVT prophylaxis with heparin  GI prophylaxis with Pepcid      Intervention Category Evaluation Type: New Patient Evaluation  [name] 05/18/2024, 2:48 AM

## 2024-05-18 NOTE — Progress Notes (Signed)
  Interdisciplinary Goals of Care Family Meeting   Date carried out:: 05/18/2024  Location of the meeting: Bedside  Member's involved: Physician and Family Member or next of kin  Durable Power of Attorney or Environmental health practitioner: Daughter  Discussion: We discussed goals of care for [employer] .    Mr. Platz sisters were updated at bedside and daughter was available via phone.  She is coming to town this evening.  They confirmed that he had started chemotherapy and radiation for his lung cancer but had stopped treatments.  He has not been very forthcoming with them about how much of his treatment he is completed.  He has indicated in the past before his cancer diagnosis that he would want to be full code, but his daughter has not discussed this with him recently.  Continue all current care measures.  Code status: Full Code  Disposition: Continue current acute care   Time spent for the meeting: 10 min  Joesph Mussel 05/18/2024, 11:48 AM

## 2024-05-18 NOTE — Progress Notes (Signed)
 NAME:  Paul [name], MRN:  [id number], DOB:  [birth year], LOS: 0 ADMISSION DATE:  05/17/2024, CONSULTATION DATE:  05/18/2024 REFERRING MD:  Hershel Los, MD, CHIEF COMPLAINT:  SOB  History of Present Illness:  Patient seen in ED Trauma B with NP Hoffman.  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  His initial sat on RA was 87% and he was placed on CPPA by EMS who also gave him 1 neb, 125mg  Solumedrol and 2mg  Mg.  In the ED his mental status initially was poor but then he apparently perked up a little.  He had not complained of Chest paina in ED.  He was placed on 20/10 cwp BiPAP in ED with 40% FIO2.  He was having apneic episodes in the ED on BiPAP.  He was started on Levophed up to 10mcg and his BP slowly improved to 103 but his HR was in high 40's and low 50's.  Patient mental status again worsened and he was not as responsive.  He was hypothermic in ED as well 95 degrees. ED labs: Ph 3.328/PCO2 52/PO2 212, Cr 1.89, LA 1.8, BNP 249, trop 38. Cxr showing officially mild pulmonary edema/congestion.  However, RLL infiltrate to be ruled out.  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   Objective    Blood pressure (!) 81/59, pulse (!) 48, temperature (!) 92.5 F (33.6 C), resp. rate 20, height 5\' 8"  (1.727 m), weight 108 kg, SpO2 100%. CVP:  [0 mmHg-19 mmHg] 15 mmHg  Vent Mode: PRVC FiO2 (%):  [40 %-100 %] 40  % Set Rate:  [20 bmp] 20 bmp Vt Set:  [410 mL-550 mL] 410 mL PEEP:  [5 cmH20] 5 cmH20 Plateau Pressure:  [22 cmH20-26 cmH20] 22 cmH20   Intake/Output Summary (Last 24 hours) at 05/18/2024 0837 Last data filed at 05/18/2024 6578 Gross per 24 hour  Intake 2081.98 ml  Output 260 ml  Net 1821.98 ml   Filed Weights   05/17/24 2151 05/18/24 0500  Weight: 105 kg 108 kg    Examination: General: critically ill appearing man lying in bed in NAD HENT: [place]/AT, eyes anicteric Lungs: no wheezing or rhales, diminished breath sounds bilaterally Cardiovascular: distant heart sounds, bradycardic. CVP 10. Abdomen: obese, soft, NT Extremities:  mild peripheral edema Neuro: RASS -3, biting on eTT  TSH 73  BUN 29 Cr 2.08 WBC 4.8 H/H 10.2/32.5 Platelets 180  UA <5wbc Blood Cx NGTD   Resolved problem list   Assessment and Plan  Septic shock, likely due to RLL pneumonia -con't ceftriaxone  & azithromycin  -vasopressors to maintain MAP >65 - Hold maintenance fluids  Acute Hypoxic and hypercapnic respiratory failure requiring MV due to pneumonia Baseline COPD & OSA -bronchodilators -collect sputum culture if able -LTVV -VAP prevention protocol -PAD protocol for sedation -daily SAT & SBT as appropriate -needs CPAP at bedtime after extubation  Hypothermia due to sepsis vs hypothyroidism>  reports noncompliance with synthroid  -SDS and levothyroxine  -sepsis treatment  Acute on chronic HFrEF -stop maintenance fluids -echo pending -hold PTA coreg , jardiance , Entresto > not sure about compliance with these  Acute AKI on chronic CKD -strict I/O -renally dose meds, avoid nephrotoxic meds  Hypothyroidism with medication noncompliance. 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  -levothyroxine , stress dose steroids  Lung cancer- not clear form records if he had resection or chemo/rads -can figure this out later when he can give a history  DMT 2 on insulin ; A1c 8.5 -hold PTA metformin  &  jardiance  -home insulin  glargine 40 units> hold for now -SSI PRN -goal BG 140-180  Acute encephalopathy, concern for septic -PAD protocol  Best Practice (right click and "Reselect all SmartList Selections" daily)   Diet/type: tubefeeds DVT prophylaxis prophylactic heparin   Pressure ulcer(s): N/A GI prophylaxis: H2B Lines: Central line Foley:  Yes, and it is still needed Code Status:  full code   Labs   CBC: Recent Labs  Lab 05/17/24 2146 05/17/24 2209 05/18/24 0022 05/18/24 0056 05/18/24 0253 05/18/24 0500  WBC 4.6  --  4.5 4.6  --  4.8  NEUTROABS 2.9  --   --   --   --   --   HGB 12.0* 10.5* 10.2* 10.2* 9.9* 10.2*  HCT 37.7* 31.0* 32.5* 32.4* 29.0* 32.5*  MCV 99.5  --  99.7 98.8  --  99.1  PLT 163  --  152 171  --  180    Basic Metabolic Panel: Recent Labs  Lab 05/17/24 2130 05/17/24 2209 05/17/24 2309 05/18/24 0022 05/18/24 0056 05/18/24 0253 05/18/24 0500  NA 146* 147* 145 147*  --  145 144  K 4.0 4.0 4.7 4.3  --  4.2 4.6  CL 112*  --  114* 114*  --   --  112*  CO2 19*  --  20* 26  --   --  25  GLUCOSE 104*  --  89 96  --   --  127*  BUN 28*  --  27* 28*  --   --  29*  CREATININE 1.81*  --  1.86* 1.91* 1.96*  --  2.08*  CALCIUM  9.4  --  8.8* 9.1  --   --  8.5*  MG  --   --   --  2.5*  --   --  2.5*  PHOS  --   --   --  3.6  --   --  3.3   GFR: Estimated Creatinine Clearance: 34.3 mL/min (A) (by C-G formula based on SCr of 2.08 mg/dL (H)). Recent Labs  Lab 05/17/24 2146 05/17/24 2315 05/18/24 0022 05/18/24 0028 05/18/24 0056 05/18/24 0500  WBC 4.6  --  4.5  --  4.6 4.8  LATICACIDVEN  --  1.6  --  1.8  --   --     Liver Function Tests: Recent Labs  Lab 05/17/24 2309  AST 47*  ALT 27  ALKPHOS 71  BILITOT 0.8  PROT 6.5  ALBUMIN 3.2*    ABG    Component Value Date/Time   PHART 7.480 (H) 05/18/2024 0253   PCO2ART 32.6 05/18/2024 0253   PO2ART 373 (H) 05/18/2024 0253   HCO3 24.7 05/18/2024 0253   TCO2 26 05/18/2024 0253    ACIDBASEDEF 0.4 07/09/2022 2019   O2SAT 100 05/18/2024 0253     Coagulation Profile: Recent Labs  Lab 05/17/24 2309  INR 1.3*      HbA1C: Hgb A1c MFr Bld  Date/Time  Value Ref Range Status  05/18/2024 02:28 AM 8.5 (H) 4.8 - 5.6 % Final    Comment:    (NOTE) Pre diabetes:          5.7%-6.4%  Diabetes:              >6.4%  Glycemic control for   <7.0% adults with diabetes   10/07/2023 06:55 AM 12.7 (H) 4.8 - 5.6 % Final    Comment:    (NOTE) Pre diabetes:          5.7%-6.4%  Diabetes:              >6.4%  Glycemic control for   <7.0% adults with diabetes     CBG: Recent Labs  Lab 05/17/24 2146 05/18/24 0240 05/18/24 0345 05/18/24 0742  GLUCAP 107* 103* 142* 133*      Critical care time:       This patient is critically ill with multiple organ system failure which requires frequent high complexity decision making, assessment, support, evaluation, and titration of therapies. This was completed through the application of advanced monitoring technologies and extensive interpretation of multiple databases. During this encounter critical care time was devoted to patient care services described in this note for 38 minutes.  Joesph Mussel, DO 05/18/24 9:21 AM [facility]  For contact information, see Amion. If no response to pager, please call PCCM consult pager. After hours, 7PM- 7AM, please call Elink.

## 2024-05-18 NOTE — Progress Notes (Signed)
 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 May need d5 drip but will hold off for now. POCUS demonstrating EF 10%+/-.  CCU extra 30 min

## 2024-05-18 NOTE — Procedures (Signed)
 Intubation Procedure Note  Paul [name]  [id number]  1946/12/15  Date:05/18/24  Time:1:30 AM   Provider Performing:Adeliz Tonkinson Mason Sole    Procedure: Intubation (31500)  Indication(s) Respiratory Failure  Consent Risks of the procedure as well as the alternatives and risks of each were explained to the patient and/or caregiver.  Consent for the procedure was obtained and is signed in the bedside chart   Anesthesia Etomidate, Versed, and Rocuronium    Time Out Verified patient identification, verified procedure, site/side was marked, verified correct patient position, special equipment/implants available, medications/allergies/relevant history reviewed, required imaging and test results available.   Sterile Technique Usual hand hygeine, masks, and gloves were used   Procedure Description Patient positioned in bed supine.  Sedation given as noted above.  Patient was intubated with endotracheal tube using Glidescope.  View was Grade 1 full glottis .  Number of attempts was 1.  Colorimetric CO2 detector was consistent with tracheal placement.   Complications/Tolerance None; patient tolerated the procedure well. Chest X-ray is ordered to verify placement.   EBL 0   Specimen(s) None

## 2024-05-18 NOTE — H&P (Addendum)
 NAME:  Paul [name], MRN:  [id number], DOB:  Dec 27, 1946, LOS: 0 ADMISSION DATE:  05/17/2024, CONSULTATION DATE:  05/18/2024 REFERRING MD:  Hershel Los, MD, CHIEF COMPLAINT:  SOB  History of Present Illness:  Patient seen in ED Trauma B with NP Hoffman.  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  His initial sat on RA was 87% and he was placed on CPPA by EMS who also gave him 1 neb, 125mg  Solumedrol and 2mg  Mg.  In the ED his mental status initially was poor but then he apparently perked up a little.  He had not complained of Chest paina in ED.  He was placed on 20/10 cwp BiPAP in ED with 40% FIO2.  He was having apneic episodes in the ED on BiPAP.  He was started on Levophed up to 10mcg and his BP slowly improved to 103 but his HR was in high 40's and low 50's.  Patient mental status again worsened and he was not as responsive.  He was hypothermic in ED as well 95 degrees. ED labs: Ph 3.328/PCO2 52/PO2 212, Cr 1.89, LA 1.8, BNP 249, trop 38. Cxr showing officially mild pulmonary edema/congestion.  However, RLL infiltrate to be ruled out.  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 rate 13, height 5\' 8"  (1.727 m), weight 105 kg, SpO2 100%.    Vent Mode: BIPAP;PCV FiO2 (%):  [40 %-60 %] 40 % Set Rate:  [20  bmp] 20 bmp   Intake/Output Summary (Last 24 hours) at 05/18/2024 0021 Last data filed at 05/17/2024 2349 Gross per 24 hour  Intake 1080 ml  Output --  Net 1080 ml   Filed Weights   05/17/24 2151  Weight: 105 kg    Examination: General: Patient on BiPAP, period of apnea, periods of accessory muscle use, poor mentation HENT: PERRLA no icterus, BiPAP mask Lungs: Right sided rhonchi/creps, left side diminished Cardiovascular: distant, not able to appreciate murmur or rubs Abdomen: soft nt nd bs pos, no guarding, obese Extremities: 2+ pitting LE edema Neuro: [place], lethargic but does following commands,   Resolved problem list   Assessment and Plan  Sepsis likely Given hypotension and hypothermia, low WBC, along with RLL infiltrate SOFA >2, SIRS criteria Not able to give full resus fluids given the pulmonary edema Broad spectrum antibiotics Get Procalcitonin and blood cultures, U/A Received 1.5 liters in ED Acute Hypoxic and hypercapnic respiratory failure Increase PCO2 and initial hypoxia requiring O2 Again, likely from PNA and pulmonary edema H/o OSA, on BiPAP, likely will need intubation secondary to apnea events, mental status and accessory muscle use RLL Pneumonia Get cultures, start empiric antibiotics Hypotension Septic shock vs Cardiogenic, normal lactic acid Get echo Will need central line given he is maxed on peripheral Levophed Likely  may need arterial line for more accurate BPs while on pressors Hypothermia On bear hugger Treat underlying cause Get TSH, which has been very elevated in the past Acute Decompensated CHF Diuresis when possible after BP stabilizes Received about 1-1.5 liters IVF in ED, hold off on more IVF Acute AKI on chronic CKD Monitor I's/O's Monitor serum Cr Hypothryoidism Will get TSH and continue on thyroid  treatment Lung cancer Not clear form records if he had resection or chemo/rads Will inquire further with family on  going 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 AMS  Likely from metabolic encephalopathy  Best Practice (right click and "Reselect all SmartList Selections" daily)   Diet/type: NPO DVT prophylaxis prophylactic heparin   Pressure ulcer(s): N/A GI prophylaxis: N/A Lines: N/A Foley:  N/A Code Status:  full code   Labs   CBC: Recent Labs  Lab 05/17/24 2146 05/17/24 2209  WBC 4.6  --   NEUTROABS 2.9  --   HGB 12.0* 10.5*  HCT 37.7* 31.0*  MCV 99.5  --   PLT 163  --     Basic Metabolic Panel: Recent Labs  Lab 05/17/24 2130 05/17/24 2209 05/17/24 2309  NA 146* 147* 145  K 4.0 4.0 4.7  CL 112*  --  114*  CO2 19*  --  20*  GLUCOSE 104*  --  89  BUN 28*  --  27*  CREATININE 1.81*  --  1.86*  CALCIUM  9.4  --  8.8*   GFR: Estimated Creatinine Clearance: 37.8 mL/min (A) (by C-G formula based on SCr of 1.86 mg/dL (H)). Recent Labs  Lab 05/17/24 2146 05/17/24 2315  WBC 4.6  --   LATICACIDVEN  --  1.6    Liver Function Tests: Recent Labs  Lab 05/17/24 2309  AST 47*  ALT 27  ALKPHOS 71  BILITOT 0.8  PROT 6.5  ALBUMIN 3.2*   No results for input(s): "LIPASE", "AMYLASE" in the last 168 hours. No results for input(s): "AMMONIA" in the last 168 hours.  ABG    Component Value Date/Time   PHART 7.328 (L) 05/17/2024 2209   PCO2ART 52.0 (H) 05/17/2024 2209   PO2ART 212 (H) 05/17/2024 2209   HCO3 27.7 05/17/2024 2209   TCO2 29 05/17/2024 2209   ACIDBASEDEF 0.4 07/09/2022 2019   O2SAT 100 05/17/2024 2209     Coagulation Profile: Recent Labs  Lab 05/17/24 2309  INR 1.3*    Cardiac Enzymes: No results for input(s): "CKTOTAL", "CKMB", "CKMBINDEX", "TROPONINI" in the last 168 hours.  HbA1C: Hgb A1c MFr Bld  Date/Time Value Ref Range Status  10/07/2023 06:55 AM 12.7 (H) 4.8 - 5.6 % Final    Comment:    (NOTE) Pre diabetes:          5.7%-6.4%  Diabetes:              >6.4%  Glycemic control for   <7.0% adults with diabetes    03/19/2023 04:14 AM 8.3 (H) 4.8 - 5.6 % Final    Comment:    (NOTE)         Prediabetes: 5.7 - 6.4         Diabetes: >6.4         Glycemic control for adults with diabetes: <7.0     CBG: Recent Labs  Lab 05/17/24 2146  GLUCAP 107*    Review of Systems:   Poor mental status, unable to perform  Past Medical History:  He,  has a past medical history  of CHF (congestive heart failure) (HCC), Coronary artery disease, Diabetes mellitus without complication (HCC), History of radiation therapy, Hypertension, Hypothyroidism, Ischemic cardiomyopathy, Pneumonia, and Sleep apnea.   Surgical History:   Past Surgical History:  Procedure Laterality Date   BRONCHIAL BIOPSY  06/04/2021   Procedure: BRONCHIAL BIOPSIES;  Surgeon: Prudy Brownie, DO;  Location: [facility];  Service: Pulmonary;;   BRONCHIAL BRUSHINGS  06/04/2021   Procedure: BRONCHIAL BRUSHINGS;  Surgeon: Prudy Brownie, DO;  Location: [facility];  Service: Pulmonary;;   BRONCHIAL NEEDLE ASPIRATION BIOPSY  06/04/2021   Procedure: BRONCHIAL NEEDLE ASPIRATION BIOPSIES;  Surgeon: Prudy Brownie, DO;  Location: [facility];  Service: Pulmonary;;   BRONCHIAL WASHINGS  06/04/2021   Procedure: BRONCHIAL WASHINGS;  Surgeon: Prudy Brownie, DO;  Location: [facility];  Service: Pulmonary;;   CARDIAC SURGERY     COLONOSCOPY     CORONARY ARTERY BYPASS GRAFT     2019 at [facility]   VIDEO BRONCHOSCOPY WITH ENDOBRONCHIAL NAVIGATION Right 06/04/2021   Procedure: VIDEO BRONCHOSCOPY WITH ENDOBRONCHIAL NAVIGATION;  Surgeon: Prudy Brownie, DO;  Location: [facility];  Service: Pulmonary;  Laterality: Right;   VIDEO BRONCHOSCOPY WITH ENDOBRONCHIAL ULTRASOUND  06/04/2021   Procedure: VIDEO BRONCHOSCOPY WITH ENDOBRONCHIAL ULTRASOUND;  Surgeon: Prudy Brownie, DO;  Location: [facility];  Service: Pulmonary;;     Social History:   reports that he has been smoking cigarettes. He started smoking about 60 years ago. He has a 30.2  pack-year smoking history. He has never used smokeless tobacco. He reports that he does not drink alcohol and does not use drugs.   Family History:  His Family history is unknown by patient.   Allergies No Known Allergies   Home Medications  Prior to Admission medications   Medication Sig Start Date End Date Taking? Authorizing Provider  albuterol  (PROVENTIL ) (2.5 MG/3ML) 0.083% nebulizer solution Inhale 3 mLs into the lungs 3 (three) times daily as needed for shortness of breath. 05/28/23   [provider]  albuterol  (VENTOLIN  HFA) 108 (90 Base) MCG/ACT inhaler Inhale 2 puffs into the lungs every 6 (six) hours as needed for wheezing or shortness of breath. 03/12/23   Heilingoetter, Cassandra L, PA-C  ammonium lactate (LAC-HYDRIN) 12 % lotion Apply 1 Application topically. 03/02/23   [provider]  aspirin  81 MG chewable tablet Take 81 mg by mouth daily.    [provider]  atorvastatin  (LIPITOR) 80 MG tablet Take 80 mg by mouth daily.    [provider]  Blood Glucose Monitoring Suppl DEVI 1 each by Does not apply route 3 (three) times daily. May dispense any manufacturer covered by patient's insurance. 10/08/23   Rai, Hurman Maiden, MD  carvedilol  (COREG ) 3.125 MG tablet Take 1 tablet (3.125 mg total) by mouth 2 (two) times daily with a meal. 10/12/23   Rai, Ripudeep K, MD  clobetasol cream (TEMOVATE) 0.05 % Apply 1 Application topically 2 (two) times daily as needed (back pain).    [provider]  empagliflozin  (JARDIANCE ) 10 MG TABS tablet Take 1 tablet (10 mg total) by mouth daily before breakfast. 10/12/23   Rai, Ripudeep K, MD  fluticasone  (FLONASE ) 50 MCG/ACT nasal spray Place 2 sprays into both nostrils daily. 04/13/23   Krishnan, Gokul, MD  Glucose Blood (BLOOD GLUCOSE TEST STRIPS) STRP 1 each by Does not apply route 3 (three) times daily. Use as directed to check blood sugar. May dispense any manufacturer covered by patient's insurance and fits  patient's device. 10/08/23  Rai, Ripudeep Linnell Richardson, MD  guaiFENesin  (MUCINEX ) 600 MG 12 hr tablet Take 1 tablet (600 mg total) by mouth 2 (two) times daily. 04/12/23   Krishnan, Gokul, MD  guaiFENesin -dextromethorphan  (ROBITUSSIN DM) 100-10 MG/5ML syrup Take 5 mLs by mouth every 4 (four) hours as needed for cough. 10/12/23   Rai, Hurman Maiden, MD  insulin  glargine-yfgn (SEMGLEE ) 100 UNIT/ML Pen Inject 30 Units into the skin in the morning. 10/12/23   Rai, Ripudeep Linnell Richardson, MD  Insulin  Pen Needle (PEN NEEDLES) 31G X 5 MM MISC 1 each by Does not apply route 3 (three) times daily. May dispense any manufacturer covered by patient's insurance. 10/08/23   Rai, Hurman Maiden, MD  Lancet Device MISC 1 each by Does not apply route 3 (three) times daily. May dispense any manufacturer covered by patient's insurance. 10/08/23   Rai, Hurman Maiden, MD  Lancets MISC 1 each by Does not apply route 3 (three) times daily. Use as directed to check blood sugar. May dispense any manufacturer covered by patient's insurance and fits patient's device. 10/08/23   Rai, Hurman Maiden, MD  levothyroxine  (SYNTHROID ) 125 MCG tablet Take 1 tablet (125 mcg total) by mouth daily before breakfast. 03/13/23   Heilingoetter, Cassandra L, PA-C  lidocaine -prilocaine  (EMLA ) cream Apply 1 application topically as needed. Patient taking differently: Apply 1 application  topically daily as needed (back pain). 12/10/21   Heilingoetter, Cassandra L, PA-C  metformin  (FORTAMET ) 1000 MG (OSM) 24 hr tablet Take 1 tablet (1,000 mg total) by mouth at bedtime. 10/12/23   Rai, Hurman Maiden, MD  Multiple Vitamins-Minerals ([employer] DAILY MULTIPLE FOR MEN) TABS Take 1 tablet by mouth daily with breakfast.    [provider]  nitroGLYCERIN  (NITROSTAT ) 0.4 MG SL tablet Place 0.4 mg under the tongue every 5 (five) minutes as needed for chest pain. 09/06/18   [provider]  ondansetron  (ZOFRAN ) 4 MG tablet Take 1 tablet (4 mg total) by mouth every 6 (six) hours as needed  for nausea or vomiting. 10/08/23   Rai, Hurman Maiden, MD  pantoprazole  (PROTONIX ) 40 MG tablet Take 1 tablet (40 mg total) by mouth daily. 10/08/23   Rai, Hurman Maiden, MD  polyethylene glycol (MIRALAX  / GLYCOLAX ) 17 g packet Take 17 g by mouth daily. 04/13/23   Krishnan, Gokul, MD  sacubitril -valsartan  (ENTRESTO ) 49-51 MG Take 1 tablet by mouth 2 (two) times daily. 10/12/23   Rai, Ripudeep K, MD  senna-docusate (SENOKOT-S) 8.6-50 MG tablet Take 2 tablets by mouth 2 (two) times daily. 04/12/23   Krishnan, Gokul, MD  silver sulfADIAZINE (SILVADENE) 1 % cream Apply 1 Application topically daily as needed (rash).    [provider]  urea (CARMOL) 10 % cream Apply topically. 03/02/23   [provider]     Critical care time: 50   The patient is critically ill with multiple organ system failure and requires high complexity decision making for assessment and support, frequent evaluation and titration of therapies, advanced monitoring, review of radiographic studies and interpretation of complex data.   Critical Care Time devoted to patient care services, exclusive of separately billable procedures, described in this note is 39 minutes.   Claven Cumming, MD [facility] See Amion for pager  If no response to pager , please call [phone] until 7pm After 7:00 pm call Elink  [phone] 05/18/2024, 12:21 AM

## 2024-05-18 NOTE — Procedures (Signed)
 Central Venous Catheter Insertion Procedure Note  Paul [name]  [id number]  29-Jun-1946  Date:05/18/24  Time:2:04 AM   Provider Performing:Rakeisha Nyce W Adriane Albe   Procedure: Insertion of Non-tunneled Central Venous [phone]) with US  guidance (66440)   Indication(s) Medication administration  Consent Risks of the procedure as well as the alternatives and risks of each were explained to the patient and/or caregiver.  Consent for the procedure was obtained and is signed in the bedside chart  Anesthesia Topical only with 1% lidocaine    Timeout Verified patient identification, verified procedure, site/side was marked, verified correct patient position, special equipment/implants available, medications/allergies/relevant history reviewed, required imaging and test results available.  Sterile Technique Maximal sterile technique including full sterile barrier drape, hand hygiene, sterile gown, sterile gloves, mask, hair covering, sterile ultrasound probe cover (if used).  Procedure Description Area of catheter insertion was cleaned with chlorhexidine  and draped in sterile fashion.  With real-time ultrasound guidance a central venous catheter was placed into the right internal jugular vein. Nonpulsatile blood flow and easy flushing noted in all ports.  The catheter was sutured in place and sterile dressing applied.  Complications/Tolerance None; patient tolerated the procedure well. Chest X-ray is ordered to verify placement for internal jugular or subclavian cannulation.   Chest x-ray is not ordered for femoral cannulation.  EBL Minimal  Specimen(s) None    Roz Cornelia, AGACNP-BC [facility]  See Amion for personal pager PCCM on call pager [phone] until 7pm. Please call Elink 7p-7a. [phone]  05/18/2024 2:05 AM

## 2024-05-19 ENCOUNTER — Encounter: Payer: Self-pay | Admitting: Internal Medicine

## 2024-05-19 ENCOUNTER — Encounter (HOSPITAL_COMMUNITY): Payer: Self-pay

## 2024-05-19 DIAGNOSIS — G9341 Metabolic encephalopathy: Secondary | ICD-10-CM

## 2024-05-19 DIAGNOSIS — R739 Hyperglycemia, unspecified: Secondary | ICD-10-CM

## 2024-05-19 DIAGNOSIS — A419 Sepsis, unspecified organism: Secondary | ICD-10-CM

## 2024-05-19 DIAGNOSIS — R6521 Severe sepsis with septic shock: Secondary | ICD-10-CM | POA: Diagnosis not present

## 2024-05-19 DIAGNOSIS — I5023 Acute on chronic systolic (congestive) heart failure: Secondary | ICD-10-CM

## 2024-05-19 DIAGNOSIS — J9601 Acute respiratory failure with hypoxia: Secondary | ICD-10-CM | POA: Diagnosis not present

## 2024-05-19 LAB — MAGNESIUM
Magnesium: 2.2 mg/dL (ref 1.7–2.4)
Magnesium: 2.2 mg/dL (ref 1.7–2.4)

## 2024-05-19 LAB — COOXEMETRY PANEL
Carboxyhemoglobin: 1.4 % (ref 0.5–1.5)
Carboxyhemoglobin: 1.6 % — ABNORMAL HIGH (ref 0.5–1.5)
Methemoglobin: <0.7 % (ref 0.0–1.5)
Methemoglobin: <0.7 % (ref 0.0–1.5)
O2 Saturation: 61.1 %
O2 Saturation: 69.4 %
Total hemoglobin: 10.4 g/dL — ABNORMAL LOW (ref 12.0–16.0)
Total hemoglobin: 10.8 g/dL — ABNORMAL LOW (ref 12.0–16.0)

## 2024-05-19 LAB — BASIC METABOLIC PANEL WITH GFR
Anion gap: 13 (ref 5–15)
BUN: 44 mg/dL — ABNORMAL HIGH (ref 8–23)
CO2: 21 mmol/L — ABNORMAL LOW (ref 22–32)
Calcium: 8.5 mg/dL — ABNORMAL LOW (ref 8.9–10.3)
Chloride: 109 mmol/L (ref 98–111)
Creatinine, Ser: 2.63 mg/dL — ABNORMAL HIGH (ref 0.61–1.24)
GFR, Estimated: 24 mL/min — ABNORMAL LOW (ref >60)
Glucose, Bld: 214 mg/dL — ABNORMAL HIGH (ref 70–99)
Potassium: 4.8 mmol/L (ref 3.5–5.1)
Sodium: 143 mmol/L (ref 135–145)

## 2024-05-19 LAB — T4, FREE: Free T4: 0.37 ng/dL — ABNORMAL LOW (ref 0.61–1.12)

## 2024-05-19 LAB — GLUCOSE, CAPILLARY
Glucose-Capillary: 156 mg/dL — ABNORMAL HIGH (ref 70–99)
Glucose-Capillary: 198 mg/dL — ABNORMAL HIGH (ref 70–99)
Glucose-Capillary: 203 mg/dL — ABNORMAL HIGH (ref 70–99)
Glucose-Capillary: 223 mg/dL — ABNORMAL HIGH (ref 70–99)
Glucose-Capillary: 234 mg/dL — ABNORMAL HIGH (ref 70–99)
Glucose-Capillary: 252 mg/dL — ABNORMAL HIGH (ref 70–99)

## 2024-05-19 LAB — LEGIONELLA PNEUMOPHILA SEROGP 1 UR AG: L. pneumophila Serogp 1 Ur Ag: NEGATIVE

## 2024-05-19 LAB — PROCALCITONIN: Procalcitonin: 0.16 ng/mL

## 2024-05-19 LAB — PHOSPHORUS
Phosphorus: 4.8 mg/dL — ABNORMAL HIGH (ref 2.5–4.6)
Phosphorus: 4.3 mg/dL (ref 2.5–4.6)

## 2024-05-19 MED ORDER — IPRATROPIUM-ALBUTEROL 0.5-2.5 (3) MG/3ML IN SOLN: 3.0000 mL | RESPIRATORY_TRACT | Status: DC | PRN

## 2024-05-19 MED ORDER — INSULIN ASPART 100 UNIT/ML IJ SOLN
4.0000 [IU] | INTRAMUSCULAR | Status: DC
  Administered 2024-05-19 – 2024-05-20 (×8): 4 [IU] via SUBCUTANEOUS

## 2024-05-19 MED ORDER — ARFORMOTEROL TARTRATE 15 MCG/2ML IN NEBU
15.0000 ug | INHALATION_SOLUTION | Freq: Two times a day (BID) | RESPIRATORY_TRACT | Status: DC
  Administered 2024-05-19 – 2024-05-21 (×5): 15 ug via RESPIRATORY_TRACT
  Filled 2024-05-19 (×5): qty 2

## 2024-05-19 MED ORDER — FUROSEMIDE 10 MG/ML IJ SOLN
80.0000 mg | Freq: Once | INTRAMUSCULAR | Status: AC
  Administered 2024-05-19: 80 mg via INTRAVENOUS
  Filled 2024-05-19: qty 8

## 2024-05-19 MED ORDER — VITAL 1.5 CAL PO LIQD
1000.0000 mL | ORAL | Status: DC
  Administered 2024-05-19 – 2024-05-20 (×2): 1000 mL
  Filled 2024-05-19 (×3): qty 1000

## 2024-05-19 MED ORDER — FAMOTIDINE 20 MG PO TABS
20.0000 mg | ORAL_TABLET | Freq: Every day | ORAL | Status: DC
  Administered 2024-05-20: 20 mg
  Filled 2024-05-19: qty 1

## 2024-05-19 MED ORDER — INSULIN GLARGINE-YFGN 100 UNIT/ML ~~LOC~~ SOLN
15.0000 [IU] | Freq: Every day | SUBCUTANEOUS | Status: DC
Start: 2024-05-20 — End: 2024-05-20
  Administered 2024-05-20: 15 [IU] via SUBCUTANEOUS
  Filled 2024-05-19: qty 0.15

## 2024-05-19 MED ORDER — REVEFENACIN 175 MCG/3ML IN SOLN
175.0000 ug | Freq: Every day | RESPIRATORY_TRACT | Status: DC
  Administered 2024-05-19 – 2024-05-25 (×7): 175 ug via RESPIRATORY_TRACT
  Filled 2024-05-19 (×7): qty 3

## 2024-05-19 MED ORDER — BUDESONIDE 0.5 MG/2ML IN SUSP
0.5000 mg | Freq: Two times a day (BID) | RESPIRATORY_TRACT | Status: DC
  Administered 2024-05-19 – 2024-05-21 (×5): 0.5 mg via RESPIRATORY_TRACT
  Filled 2024-05-19 (×5): qty 2

## 2024-05-19 NOTE — Progress Notes (Signed)
 Initial Nutrition Assessment  DOCUMENTATION CODES:   Not applicable  INTERVENTION:   Tube feeding via OG:  Change to Vital 1.5 at 50 ml/hr Pro-Source TF20 60 mL daily TF at goal provides 101 g of protein, 1880 kcals, 912 mL of free water   NUTRITION DIAGNOSIS:   Inadequate oral intake related to acute illness as evidenced by NPO status.  GOAL:   Patient will meet greater than or equal to 90% of their needs   MONITOR:   Vent status, Labs, TF tolerance, Weight trends  REASON FOR ASSESSMENT:   Consult Enteral/tube feeding initiation and management  ASSESSMENT:   78 yo male admitted with septic shock likely secondary to RLL pneumonia, acute respiratory failure requiring intubation, hypothermia due to sepsis vs hypothyroidism, acute on chronic CHF, AKI on CKD. 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 Current dry weight not known. Moderate edema on all extremities  ECHO with severe RV failure + LVEF <20%  Labs: Sodium 143 (wdl) Potassium 4.8 (wdl) BUN 44 Creatinine 2.63 Phosphorus 4.8 (wdl)  Meds:  SS novolog  Novolog  4 units with 4 hours Colace and Miralax  prn   NUTRITION - FOCUSED PHYSICAL EXAM:  Flowsheet Row Most Recent Value  Orbital Region No depletion  Upper Arm Region Unable to assess  [UE edema]  Thoracic and Lumbar Region No depletion  Buccal Region No depletion  Temple Region No depletion  Clavicle Bone Region No depletion  Clavicle and  Acromion Bone Region No depletion  Scapular Bone Region No depletion  Dorsal Hand Unable to assess  Patellar Region Unable to assess  Anterior Thigh Region Unable to assess  Posterior Calf Region Unable to assess  Edema (RD Assessment) Moderate       Diet Order:   Diet Order             Diet NPO time specified  Diet effective now                   EDUCATION NEEDS:   Not appropriate for education at this time  Skin:  Skin Assessment: Reviewed RN Assessment  Last BM:  PTA  Height:   Ht Readings from Last 1 Encounters:  05/17/24 5\' 8"  (1.727 m)    Weight:   Wt Readings from Last 1 Encounters:  05/19/24 111 kg    BMI:  Body mass index is 37.21 kg/m.  Estimated Nutritional Needs:   Kcal:  1800-2000 kcals  Protein:  100-120 g  Fluid:  1.8 L   Norvel Beer MS, RDN, LDN, CNSC Registered Dietitian 3 Clinical Nutrition RD Inpatient Contact Info in Amion

## 2024-05-19 NOTE — Plan of Care (Signed)
  Problem: Education: Goal: Ability to describe self-care measures that may prevent or decrease complications (Diabetes Survival Skills Education) will improve Outcome: Progressing Goal: Individualized Educational Video(s) Outcome: Progressing   Problem: Fluid Volume: Goal: Ability to maintain a balanced intake and output will improve Outcome: Progressing   Problem: Nutritional: Goal: Maintenance of adequate nutrition will improve Outcome: Progressing Goal: Progress toward achieving an optimal weight will improve Outcome: Progressing   Problem: Skin Integrity: Goal: Risk for impaired skin integrity will decrease Outcome: Progressing

## 2024-05-19 NOTE — TOC Initial Note (Signed)
 Transition of Care [facility]) - Initial/Assessment Note    Patient Details  Name: Paul [name] MRN: [id number] Date of Birth: 1946-01-16  [facility]) CM/SW Contact:    Benjiman Bras, RN Phone Number: [phone] 05/19/2024, 5:22 PM  Clinical Narrative:                 TOC CM spoke to pt's dtr at bedside. States pt lives in apt alone. States she is his HCPOA. States he went to [facility] in the past for SNF rehab. Waiting for pt to be medically stable to assist with dc planning. States she does not want [facility] but would be open to another facility for rehab. States they plan is for ALF in the near future.   Expected Discharge Plan: Skilled Nursing Facility Barriers to Discharge: Continued Medical Work up   Patient Goals and CMS Choice            Expected Discharge Plan and Services                                              Prior Living Arrangements/Services                       Activities of Daily Living      Permission Sought/Granted                  Emotional Assessment   Attitude/Demeanor/Rapport: Intubated (Following Commands or Not Following Commands)          Admission diagnosis:  Cardiogenic shock (HCC) [R57.0] Shock (HCC) [R57.9] Acute respiratory failure with hypoxia (HCC) [J96.01] Sepsis (HCC) [A41.9] Patient Active Problem List   Diagnosis Date Noted   Shock (HCC) 05/18/2024   Sepsis (HCC) 05/18/2024   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 (HCC) 10/07/2023   Type 2 diabetes mellitus with chronic kidney disease, with long-term current use of insulin  (HCC) 03/18/2023   Community acquired bacterial pneumonia 03/16/2023   Shortness of breath 03/12/2023   AKI (acute kidney injury) (HCC) 07/10/2022   Hyperkalemia 07/10/2022   Hypokalemia 07/10/2022   DKA (diabetic ketoacidosis) (HCC) 07/09/2022   Cough 06/04/2022   Goals of care, counseling/discussion 10/23/2021    Hyperosmolar hyperglycemic state (HHS) (HCC) 06/23/2021   Hyperglycemia due to diabetes mellitus (HCC) 06/22/2021   GERD (gastroesophageal reflux disease) 06/22/2021   Essential hypertension 06/22/2021   Hyperlipidemia 06/22/2021   Adenocarcinoma of right lung, stage 3 (HCC) 06/17/2021   Encounter for antineoplastic chemotherapy 06/17/2021   Encounter for antineoplastic immunotherapy 06/17/2021   Lung nodule 05/17/2021   Stage 3a chronic kidney disease (CKD) (HCC) 09/03/2018   CAD, multiple vessel 09/03/2018   PCP:  Clinic, [facility] Pharmacy:   [facility] - [place], [place] - [address] [place] [address] Phone: [phone] Fax: [phone]  [facility] [address], [place] - [address] AT [address] [place] [address] Phone: [phone] Fax: [phone]  [place] [facility] [address] [place] [address] Phone: [phone] Fax: [phone]     Social Drivers of Health (SDOH) Social History: SDOH Screenings   Food Insecurity: Patient Unable To Answer (05/18/2024)  Housing: Patient Unable To Answer (05/18/2024)  Transportation Needs: Patient Unable To Answer (05/18/2024)  Utilities: Patient  Unable To Answer (05/18/2024)  Financial Resource Strain: Low Risk  (02/03/2023)   Received from [facility]  Social Connections: Patient Unable To Answer (05/18/2024)  Stress: No Stress Concern Present (02/03/2023)   [facility]  Tobacco Use: High Risk (10/06/2023)   SDOH Interventions: Food Insecurity Interventions: Patient Unable to Answer Housing Interventions: Patient Unable to Answer Transportation Interventions: Patient Unable to Answer Utilities Interventions: Patient Unable to Answer Social Connections Interventions: Patient Unable to Answer   Readmission Risk  Interventions    04/08/2023    4:58 PM  Readmission Risk Prevention Plan  Transportation Screening Complete  PCP or Specialist Appt within 5-7 Days Complete  Home Care Screening Complete  Medication Review (RN CM) Complete

## 2024-05-19 NOTE — Progress Notes (Signed)
 NAME:  Paul [name], MRN:  [id number], DOB:  Apr 20, 1946, LOS: 1 ADMISSION DATE:  05/17/2024, CONSULTATION DATE:  05/18/2024 REFERRING MD:  Hershel Los, MD, CHIEF COMPLAINT:  SOB  History of Present Illness:  Patient seen in ED Trauma B with NP Hoffman.  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  His initial sat on RA was 87% and he was placed on CPPA by EMS who also gave him 1 neb, 125mg  Solumedrol and 2mg  Mg.  In the ED his mental status initially was poor but then he apparently perked up a little.  He had not complained of Chest paina in ED.  He was placed on 20/10 cwp BiPAP in ED with 40% FIO2.  He was having apneic episodes in the ED on BiPAP.  He was started on Levophed up to 10mcg and his BP slowly improved to 103 but his HR was in high 40's and low 50's.  Patient mental status again worsened and he was not as responsive.  He was hypothermic in ED as well 95 degrees. ED labs: Ph 3.328/PCO2 52/PO2 212, Cr 1.89, LA 1.8, BNP 249, trop 38. Cxr showing officially mild pulmonary edema/congestion.  However, RLL infiltrate to be ruled out.  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 CAP coverage. TSH > 70. Severe BiV failure. NE vaso   5/22 checking PCT, diuresing. Weaning NE   Interim History / Subjective:  ECHO 5/21 w severe BiV failure  Objective    Blood pressure 103/60, pulse (!) 49, temperature (!) 97.5 F (36.4 C), resp. rate (!) 30, height 5\' 8"  (1.727  m), weight 111 kg, SpO2 100%. CVP:  [7 mmHg-35 mmHg] 8 mmHg  Vent Mode: PRVC FiO2 (%):  [40 %] 40 % Set Rate:  [20 bmp] 20 bmp Vt Set:  [410 mL-540 mL] 540 mL PEEP:  [5 cmH20] 5 cmH20 Plateau Pressure:  [21 cmH20-29 cmH20] 28 cmH20   Intake/Output Summary (Last 24 hours) at 05/19/2024 1127 Last data filed at 05/19/2024 1000 Gross per 24 hour  Intake 2301.98 ml  Output 1345 ml  Net 956.98 ml   Filed Weights   05/17/24 2151 05/18/24 0500 05/19/24 0500  Weight: 105 kg 108 kg 111 kg    Examination: General: Chronically and critically ill older adult M NAD  HENT: NCAT pink mm ETT secure  Lungs: Scattered rhonchi and wheeze Cardiovascular: cap refill < 3 sec. S1s2  Abdomen: soft round ndnt  Extremities: BLE edema. No obvious acute joint deformity  Neuro: sedated does not follow commands    Resolved problem list   Assessment and Plan    Acute encephalopathy Hx PTSD  P -wean sedation for RASS 0/-1  Acute resp failure w hypoxia and hypercarbia  CAP v pulm edema  Stg 3b adeno R lung, 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) not currently on tx -?some chronic radiation pneumonitis  -has not seen onc since his 12/2023 PET which is  c/f RML tumor recurrence + mildly hypermetabolic paratracheal nodes  P -cont MV support -diurese -send trach asp, RVP has been ordered and is still pending  -adding triple therapy  + PRN duonebs 5/22   Shock -- septic +/- cardiogenic  -would presume source of infection to be PNA  P -cont vaso, NE for MAP > 65  -is also on stress dose steroids r/t his TSH  > 70  -- will eval to cont or dc this again 5/22  -cont CAP coverage -sending PCT 5/22 -follow bcx  -HF as below   BiV HF  -ECHO w sev RV failure  + LVEF < 20%. Coox is 67 on 12 NE + vaso  P -cont diuresis  -NE and vaso -- preferentially weaning NE. When we get to NE 5 + vaso, will recheck coox  -follow coox qAM otherwise   AKI on CKD  P -diuresing 5/22 -follow renal indices  UOP   DM2 P -SSI + q4hr 4units novolog   Hypothyroidism // ? Myxedema crisis  P -cont IV synthroid  as well as solucortef for now  -AM TSH T3 T4   Medication non adherence  -daughter articulates recurrent pattern of med non adherence; her hope would be for him to move into assisted living, to date he has declined this option. -?if [facility] would be helpful for him. Sounds like he has a roommate but isnt helpful for managing meds  -TOC consult     Best Practice (right click and "Reselect all SmartList Selections" daily)   Diet/type: NPO to start EN 5/22  DVT prophylaxis prophylactic heparin   Pressure ulcer(s): N/A GI prophylaxis: H2B Lines: Central line Foley:  Yes, and it is still needed Code Status:  full code Daughter updated at bedside 5/22   Labs   CBC: Recent Labs  Lab 05/17/24 2146 05/17/24 2209 05/18/24 0022 05/18/24 0056 05/18/24 0253 05/18/24 0500  WBC 4.6  --  4.5 4.6  --  4.8  NEUTROABS 2.9  --   --   --   --   --   HGB 12.0* 10.5* 10.2* 10.2* 9.9* 10.2*  HCT 37.7* 31.0* 32.5* 32.4* 29.0* 32.5*  MCV 99.5  --  99.7 98.8  --  99.1  PLT 163  --  152 171  --  180    Basic Metabolic Panel: Recent Labs  Lab 05/17/24 2130 05/17/24 2209 05/17/24 2309 05/18/24 0022 05/18/24 0056 05/18/24 0253 05/18/24 0500 05/18/24 1649 05/19/24 0515  NA 146*   < > 145 147*  --  145 144  --  143  K 4.0   < > 4.7 4.3  --  4.2 4.6  --  4.8  CL 112*  --  114* 114*  --   --  112*  --  109  CO2 19*  --  20* 26  --   --  25  --  21*  GLUCOSE 104*  --  89 96  --   --  127*  --  214*  BUN 28*  --  27* 28*  --   --  29*  --  44*  CREATININE 1.81*  --  1.86* 1.91* 1.96*  --  2.08*  --  2.63*  CALCIUM  9.4  --  8.8* 9.1  --   --  8.5*  --  8.5*  MG  --   --   --  2.5*  --   --  2.5* 2.2 2.2  PHOS  --   --   --  3.6  --   --  3.3 4.2 4.8*   < > = values in this interval not displayed.   GFR: Estimated Creatinine Clearance: 27.5 mL/min (A) (by C-G formula based on SCr of 2.63 mg/dL  (H)). Recent Labs  Lab 05/17/24 2146 05/17/24 2315 05/18/24 0022 05/18/24 0028 05/18/24 0056 05/18/24 0500 05/18/24 1838 05/18/24 2126 05/19/24 0515  PROCALCITON  --   --   --   --   --   --   --   --  0.16  WBC 4.6  --  4.5  --  4.6 4.8  --   --   --   LATICACIDVEN  --  1.6  --  1.8  --   --  1.5 1.6  --     Liver Function Tests: Recent Labs  Lab 05/17/24 2309  AST 47*  ALT 27  ALKPHOS 71  BILITOT 0.8  PROT 6.5  ALBUMIN 3.2*   No results for input(s): "LIPASE", "AMYLASE" in the last 168 hours. No results for input(s): "AMMONIA" in the last 168 hours.  ABG    Component Value Date/Time   PHART 7.480 (H) 05/18/2024 0253   PCO2ART 32.6 05/18/2024 0253   PO2ART 373 (H) 05/18/2024 0253   HCO3 24.7 05/18/2024 0253   TCO2 26 05/18/2024 0253   ACIDBASEDEF 0.4 07/09/2022 2019   O2SAT 67.4 05/18/2024 1852     Coagulation Profile: Recent Labs  Lab 05/17/24 2309  INR 1.3*    Cardiac Enzymes: No results for input(s): "CKTOTAL", "CKMB", "CKMBINDEX", "TROPONINI" in the last 168 hours.  HbA1C: Hgb A1c MFr Bld  Date/Time Value Ref Range Status  05/18/2024 02:28 AM 8.5 (H) 4.8 - 5.6 % Final    Comment:    (NOTE) Pre diabetes:          5.7%-6.4%  Diabetes:              >6.4%  Glycemic control for   <7.0% adults with diabetes   10/07/2023 06:55 AM 12.7 (H) 4.8 - 5.6 % Final    Comment:    (NOTE) Pre diabetes:          5.7%-6.4%  Diabetes:              >6.4%  Glycemic control for   <7.0% adults with diabetes     CBG: Recent Labs  Lab 05/18/24 2001 05/18/24 2343 05/19/24 0344 05/19/24 0722 05/19/24 1118  GLUCAP 195* 171* 156* 223* 198*    CRITICAL CARE Performed by: Delories Fetter   Total critical care time: 40 minutes  Critical care time was exclusive of separately billable procedures and treating other patients. Critical care was necessary to treat or prevent imminent or life-threatening deterioration.  Critical care was time spent  personally by me on the following activities: development of treatment plan with patient and/or surrogate as well as nursing, discussions with consultants, evaluation of patient's response to treatment, examination of patient, obtaining history from patient or surrogate, ordering and performing treatments and interventions, ordering and review of laboratory studies, ordering and review of radiographic studies, pulse oximetry and re-evaluation of patient's condition.  Eston Hence MSN, AGACNP-BC [place] [facility] Amion for pager  05/19/2024, 11:27 AM

## 2024-05-20 DIAGNOSIS — A419 Sepsis, unspecified organism: Secondary | ICD-10-CM | POA: Diagnosis not present

## 2024-05-20 DIAGNOSIS — R739 Hyperglycemia, unspecified: Secondary | ICD-10-CM | POA: Diagnosis not present

## 2024-05-20 DIAGNOSIS — R6521 Severe sepsis with septic shock: Secondary | ICD-10-CM | POA: Diagnosis not present

## 2024-05-20 DIAGNOSIS — J9601 Acute respiratory failure with hypoxia: Secondary | ICD-10-CM | POA: Diagnosis not present

## 2024-05-20 LAB — GLUCOSE, CAPILLARY
Glucose-Capillary: 116 mg/dL — ABNORMAL HIGH (ref 70–99)
Glucose-Capillary: 158 mg/dL — ABNORMAL HIGH (ref 70–99)
Glucose-Capillary: 227 mg/dL — ABNORMAL HIGH (ref 70–99)
Glucose-Capillary: 281 mg/dL — ABNORMAL HIGH (ref 70–99)
Glucose-Capillary: 71 mg/dL (ref 70–99)
Glucose-Capillary: 83 mg/dL (ref 70–99)

## 2024-05-20 LAB — COOXEMETRY PANEL
Carboxyhemoglobin: 1 % (ref 0.5–1.5)
Methemoglobin: <0.7 % (ref 0.0–1.5)
O2 Saturation: 68.1 %
Total hemoglobin: 9.9 g/dL — ABNORMAL LOW (ref 12.0–16.0)

## 2024-05-20 LAB — BASIC METABOLIC PANEL WITH GFR
Anion gap: 12 (ref 5–15)
BUN: 55 mg/dL — ABNORMAL HIGH (ref 8–23)
CO2: 25 mmol/L (ref 22–32)
Calcium: 8.4 mg/dL — ABNORMAL LOW (ref 8.9–10.3)
Chloride: 106 mmol/L (ref 98–111)
Creatinine, Ser: 2.67 mg/dL — ABNORMAL HIGH (ref 0.61–1.24)
GFR, Estimated: 24 mL/min — ABNORMAL LOW (ref >60)
Glucose, Bld: 262 mg/dL — ABNORMAL HIGH (ref 70–99)
Potassium: 3.6 mmol/L (ref 3.5–5.1)
Sodium: 143 mmol/L (ref 135–145)

## 2024-05-20 LAB — TSH: TSH: 42.587 u[IU]/mL — ABNORMAL HIGH (ref 0.350–4.500)

## 2024-05-20 LAB — T4, FREE: Free T4: 0.34 ng/dL — ABNORMAL LOW (ref 0.61–1.12)

## 2024-05-20 MED ORDER — INSULIN ASPART 100 UNIT/ML IJ SOLN: 4.0000 [IU] | Freq: Three times a day (TID) | INTRAMUSCULAR | Status: DC

## 2024-05-20 MED ORDER — INSULIN GLARGINE-YFGN 100 UNIT/ML ~~LOC~~ SOLN
15.0000 [IU] | Freq: Two times a day (BID) | SUBCUTANEOUS | Status: DC
  Filled 2024-05-20 (×3): qty 0.15

## 2024-05-20 MED ORDER — POTASSIUM CHLORIDE 20 MEQ PO PACK
20.0000 meq | PACK | Freq: Once | ORAL | Status: AC
  Administered 2024-05-20: 20 meq
  Filled 2024-05-20: qty 1

## 2024-05-20 MED ORDER — HYDROCORTISONE SOD SUC (PF) 100 MG IJ SOLR
100.0000 mg | Freq: Two times a day (BID) | INTRAMUSCULAR | Status: DC
  Administered 2024-05-21 (×2): 100 mg via INTRAVENOUS
  Filled 2024-05-20 (×3): qty 2

## 2024-05-20 MED ORDER — BISACODYL 10 MG RE SUPP
10.0000 mg | Freq: Every day | RECTAL | Status: DC | PRN
  Administered 2024-05-20: 10 mg via RECTAL
  Filled 2024-05-20: qty 1

## 2024-05-20 NOTE — Progress Notes (Signed)
 eLink Physician-Brief Progress Note Patient Name: Orman Matsumura DOB: 02-22-1946 MRN: [id number]   Date of Service  05/20/2024  HPI/Events of Note  Last 2 blood glucose levels 71 and 83.    eICU Interventions  Long acting insulin  held tonight     Intervention Category Intermediate Interventions: Other:  Linda Repress, Arlester Ladd 05/20/2024, 11:14 PM

## 2024-05-20 NOTE — Progress Notes (Addendum)
 NAME:  Paul [name], MRN:  [id number], DOB:  06-04-1946, LOS: 2 ADMISSION DATE:  05/17/2024, CONSULTATION DATE:  05/18/2024 REFERRING MD:  Hershel Los, MD, CHIEF COMPLAINT:  SOB  History of Present Illness:  Patient seen in ED Trauma B with NP Hoffman.  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  His initial sat on RA was 87% and he was placed on CPPA by EMS who also gave him 1 neb, 125mg  Solumedrol and 2mg  Mg.  In the ED his mental status initially was poor but then he apparently perked up a little.  He had not complained of Chest paina in ED.  He was placed on 20/10 cwp BiPAP in ED with 40% FIO2.  He was having apneic episodes in the ED on BiPAP.  He was started on Levophed up to 10mcg and his BP slowly improved to 103 but his HR was in high 40's and low 50's.  Patient mental status again worsened and he was not as responsive.  He was hypothermic in ED as well 95 degrees. ED labs: Ph 3.328/PCO2 52/PO2 212, Cr 1.89, LA 1.8, BNP 249, trop 38. Cxr showing officially mild pulmonary edema/congestion.  However, RLL infiltrate to be ruled out.  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 CAP coverage. TSH > 70. Severe BiV failure. NE vaso   5/22 checking PCT, diuresing. Weaning NE   Interim History / Subjective:   Off vaso Coox 68 this morning on 5 NE CVP 5  CBGs in 200s   K low at 3.6, 3.4 L UOP yesterday/overnigiht   Objective    Blood pressure 104/64, pulse  (!) 51, temperature (!) 97.5 F (36.4 C), resp. rate 12, height 5\' 8"  (1.727 m), weight 106.2 kg, SpO2 100%. CVP:  [2 mmHg-12 mmHg] 2 mmHg  Vent Mode: PRVC FiO2 (%):  [40 %] 40 % Set Rate:  [20 bmp] 20 bmp Vt Set:  [540 mL] 540 mL PEEP:  [5 cmH20] 5 cmH20 Plateau Pressure:  [25 cmH20-29 cmH20] 25 cmH20   Intake/Output Summary (Last 24 hours) at 05/20/2024 1028 Last data filed at 05/20/2024 0700 Gross per 24 hour  Intake 1706.52 ml  Output 2530 ml  Net -823.48 ml   Filed Weights   05/18/24 0500 05/19/24 0500 05/20/24 0500  Weight: 108 kg 111 kg 106.2 kg    Examination: General: Chronically and critically ill elderly M NAD  HENT: NCAT pink mm ETT secure w bite block in place  Lungs: Mechanically ventilated improved wheeze  Cardiovascular: s1s2 cap refill brisk  Abdomen: soft ndnt  Extremities: No acute joint deformity  Neuro: lightly sedated following commands   Resolved problem list   Assessment and Plan    Acute encephalopathy, improving  Hx PTSD  P -wean sedation for RASS 0/-1 -- progressing toward weaning off altogether to facilitate vent wean -delirium precautions   Acute resp failure w hypoxia and hypercarbia  CAP v pulm edema  Stg 3b adeno R lung, 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 PCT  0.16, drawn after few doses of abx  P -NE for MAP > 65  -is also on stress dose steroids r/t his TSH  initially >70 -- re-eval this weekend when to dc these. Planning to continue 5/23  -cont CAP coverage -- PCT would argue for short  course -follow micro data  -will get AM CBC  -HF as below   BiV HF  -ECHO w sev RV failure  + LVEF < 20%. P -holding diuresis 5/23 -- coox 68 on  5 NE w cvp 5 and Cr 2.7 -wean NE as able for MAP 65 -am coox   AKI on CKD  -great response to lasix 5/22 w >3L UOP  P -follow renal indices UOP  -giving 20 mEq Kcl  -AM BMP   DM2 P -SSI + q4hr 4units novolog  + glargine 15u   Hypothyroidism // ? Myxedema crisis  -TSH down to 42 (from >70) and  FT4 0.34 P -cont synthroid  100mcg IV (home dose is 125 mcg PO but he is not compliant w this) + stress steroids for now  -will order for repeat TSH on Sunday 5/25   Medication non adherence  -daughter articulates recurrent pattern of med non adherence; her hope would be for him to move into assisted living, to date he has declined this option in the past P -TOC is following  -when appropriate, PT/OT consults. Wonder if he might be a CIR v SNF candidate --> AL vs home with [facility]     Best Practice (right click and "Reselect all SmartList Selections" daily)   Diet/type: NPO  EN  DVT prophylaxis prophylactic heparin   Pressure ulcer(s): N/A GI prophylaxis: H2B Lines: Central line Foley:  Yes, and it is still needed Code Status:  full code Daughter updated at bedside 5/23   Labs   CBC: Recent Labs  Lab 05/17/24 2146 05/17/24 2209 05/18/24 0022 05/18/24 0056 05/18/24 0253 05/18/24 0500  WBC 4.6  --  4.5 4.6  --  4.8  NEUTROABS 2.9  --   --   --   --   --   HGB 12.0* 10.5* 10.2* 10.2* 9.9* 10.2*  HCT 37.7* 31.0* 32.5* 32.4* 29.0* 32.5*  MCV 99.5  --  99.7 98.8  --  99.1  PLT 163  --  152 171  --  180    Basic Metabolic Panel: Recent Labs  Lab 05/17/24 2309 05/18/24 0022 05/18/24 0056 05/18/24 0253 05/18/24 0500 05/18/24 1649 05/19/24 0515 05/19/24 1834 05/20/24 0408  NA 145 147*  --  145 144  --  143  --  143  K 4.7 4.3  --  4.2 4.6  --  4.8  --  3.6  CL 114* 114*  --   --  112*  --  109  --  106  CO2 20* 26  --    --  25  --  21*  --  25  GLUCOSE 89 96  --   --  127*  --  214*  --  262*  BUN 27* 28*  --   --  29*  --  44*  --  55*  CREATININE 1.86* 1.91* 1.96*  --  2.08*  --  2.63*  --  2.67*  CALCIUM  8.8* 9.1  --   --  8.5*  --  8.5*  --  8.4*  MG  --  2.5*  --   --  2.5* 2.2 2.2 2.2  --   PHOS  --  3.6  --   --  3.3 4.2 4.8* 4.3  --    GFR: Estimated Creatinine Clearance: 26.5 mL/min (A) (by C-G formula based on SCr of 2.67 mg/dL (H)). Recent Labs  Lab 05/17/24 2146 05/17/24 2315 05/18/24 0022 05/18/24 0028 05/18/24 0056 05/18/24 0500 05/18/24 1838 05/18/24 2126 05/19/24 0515  PROCALCITON  --   --   --   --   --   --   --   --  0.16  WBC 4.6  --  4.5  --  4.6 4.8  --   --   --   LATICACIDVEN  --  1.6  --  1.8  --   --  1.5 1.6  --     Liver Function Tests: Recent Labs  Lab 05/17/24 2309  AST 47*  ALT 27  ALKPHOS 71  BILITOT 0.8  PROT 6.5  ALBUMIN 3.2*   No results for input(s): "LIPASE", "AMYLASE" in the last 168 hours. No results for input(s): "AMMONIA" in the last 168 hours.  ABG    Component Value Date/Time   PHART 7.480 (H) 05/18/2024 0253   PCO2ART 32.6 05/18/2024 0253   PO2ART 373 (H) 05/18/2024 0253   HCO3 24.7 05/18/2024 0253   TCO2 26 05/18/2024 0253   ACIDBASEDEF 0.4 07/09/2022 2019   O2SAT 68.1 05/20/2024 0430     Coagulation Profile: Recent Labs  Lab 05/17/24 2309  INR 1.3*    Cardiac Enzymes: No results for input(s): "CKTOTAL", "CKMB", "CKMBINDEX", "TROPONINI" in the last 168 hours.  HbA1C: Hgb A1c MFr Bld  Date/Time Value Ref Range Status  05/18/2024 02:28 AM 8.5 (H) 4.8 - 5.6 % Final    Comment:    (NOTE) Pre diabetes:          5.7%-6.4%  Diabetes:              >6.4%  Glycemic control for   <7.0% adults with diabetes   10/07/2023 06:55 AM 12.7 (H) 4.8 - 5.6 % Final    Comment:    (NOTE) Pre diabetes:          5.7%-6.4%  Diabetes:              >6.4%  Glycemic control for   <7.0% adults with diabetes     CBG: Recent Labs   Lab 05/19/24 1641 05/19/24 2001 05/19/24 2344 05/20/24 0329 05/20/24 0739  GLUCAP 234* 203* 252* 227* 281*    CRITICAL CARE Performed by: Delories Fetter   Total critical care time: 39 minutes  Critical care time was exclusive of separately billable procedures and treating other patients. Critical care was necessary to treat or prevent imminent or life-threatening deterioration.  Critical care was time spent personally by me on the following activities: development of treatment plan with patient and/or surrogate as well as nursing, discussions with consultants, evaluation of patient's response to treatment, examination of patient, obtaining history from patient or surrogate, ordering and performing treatments and interventions, ordering and review  of laboratory studies, ordering and review of radiographic studies, pulse oximetry and re-evaluation of patient's condition.  Eston Hence MSN, AGACNP-BC [place] Pulmonary/Critical Care Medicine Amion for pager  05/20/2024, 10:28 AM

## 2024-05-20 NOTE — Procedures (Signed)
 Extubation Procedure Note  Patient Details:   Name: Paul [name] DOB: 20-Apr-1946 MRN: [id number]   Airway Documentation:    Vent end date: 05/20/24 Vent end time: 1237   Evaluation  O2 sats: stable throughout Complications: No apparent complications Patient did tolerate procedure well. Bilateral Breath Sounds: Expiratory wheezes, Diminished   Yes,  Per CCM order, RT extubate pt to . Prior to extubation, pt did have a positive cuff leak. Pt tolerated well with SVS, no complications, no stridor noted and pt was was able to state his name.  [name] R 05/20/2024, 12:40 PM

## 2024-05-20 NOTE — Inpatient Diabetes Management (Signed)
 Inpatient Diabetes Program Recommendations  AACE/ADA: New Consensus Statement on Inpatient Glycemic Control (2015)  Target Ranges:  Prepandial:   less than 140 mg/dL      Peak postprandial:   less than 180 mg/dL (1-2 hours)      Critically ill patients:  140 - 180 mg/dL   Lab Results  Component Value Date   GLUCAP 281 (H) 05/20/2024   HGBA1C 8.5 (H) 05/18/2024    Latest Reference Range & Units 05/19/24 16:41 05/19/24 20:01 05/19/24 23:44 05/20/24 03:29 05/20/24 07:39  Glucose-Capillary 70 - 99 mg/dL 161 (H) 096 (H) 045 (H) 227 (H) 281 (H)  (H): Data is abnormally high Review of Glycemic Control  Diabetes history: DM2 Outpatient Diabetes medications: lantus  40 units daily, Jardiance  25 mg 1/2 tab in am, Metformin  XR 1000 mg BID Current orders for Inpatient glycemic control: Semglee  15 units daily, Novolog  0-15 units scale every 4 hours, Novolog  4 units every 4 hours  Inpatient Diabetes Program Recommendations:   Noted that blood sugars continue to be greater than 180 mg/dl.  Recommend increasing Semglee  to 18 units daily and consider increasing Novolog  tube feed coverage to 6 units every 4 hours if blood sugars continue to be elevated.  Nick Barman RN BSN CDE Diabetes Coordinator Pager: [phone]  8am-5pm

## 2024-05-20 NOTE — Progress Notes (Signed)
 Seen in follow up, put on PSV/CPAP 8/5 around 1145, changed to 5/5 around 12. Continued weaning well, following commands & meets extubation criteria  Orders placed, d/w family + RN  RT  Extubated about 1240    Eston Hence MSN, AGACNP-BC [facility] Pulmonary/Critical Care Medicine 05/20/2024, 12:41 PM

## 2024-05-20 NOTE — Plan of Care (Signed)
  Problem: Education: Goal: Ability to describe self-care measures that may prevent or decrease complications (Diabetes Survival Skills Education) will improve Outcome: Progressing Goal: Individualized Educational Video(s) Outcome: Progressing   Problem: Nutritional: Goal: Maintenance of adequate nutrition will improve Outcome: Progressing Goal: Progress toward achieving an optimal weight will improve Outcome: Progressing   Problem: Skin Integrity: Goal: Risk for impaired skin integrity will decrease Outcome: Progressing

## 2024-05-21 ENCOUNTER — Inpatient Hospital Stay (HOSPITAL_COMMUNITY)

## 2024-05-21 ENCOUNTER — Telehealth: Payer: Self-pay | Admitting: Internal Medicine

## 2024-05-21 DIAGNOSIS — G9340 Encephalopathy, unspecified: Secondary | ICD-10-CM

## 2024-05-21 DIAGNOSIS — C3491 Malignant neoplasm of unspecified part of right bronchus or lung: Secondary | ICD-10-CM

## 2024-05-21 DIAGNOSIS — J9602 Acute respiratory failure with hypercapnia: Secondary | ICD-10-CM | POA: Diagnosis not present

## 2024-05-21 DIAGNOSIS — R911 Solitary pulmonary nodule: Secondary | ICD-10-CM

## 2024-05-21 DIAGNOSIS — J90 Pleural effusion, not elsewhere classified: Secondary | ICD-10-CM | POA: Diagnosis not present

## 2024-05-21 DIAGNOSIS — R579 Shock, unspecified: Secondary | ICD-10-CM | POA: Diagnosis not present

## 2024-05-21 DIAGNOSIS — J9601 Acute respiratory failure with hypoxia: Secondary | ICD-10-CM | POA: Diagnosis not present

## 2024-05-21 LAB — GLUCOSE, CAPILLARY
Glucose-Capillary: 71 mg/dL (ref 70–99)
Glucose-Capillary: 83 mg/dL (ref 70–99)
Glucose-Capillary: 109 mg/dL — ABNORMAL HIGH (ref 70–99)
Glucose-Capillary: 223 mg/dL — ABNORMAL HIGH (ref 70–99)
Glucose-Capillary: 216 mg/dL — ABNORMAL HIGH (ref 70–99)

## 2024-05-21 LAB — BASIC METABOLIC PANEL WITH GFR
Anion gap: 10 (ref 5–15)
BUN: 51 mg/dL — ABNORMAL HIGH (ref 8–23)
CO2: 29 mmol/L (ref 22–32)
Calcium: 8.8 mg/dL — ABNORMAL LOW (ref 8.9–10.3)
Chloride: 109 mmol/L (ref 98–111)
Creatinine, Ser: 2.24 mg/dL — ABNORMAL HIGH (ref 0.61–1.24)
GFR, Estimated: 29 mL/min — ABNORMAL LOW (ref >60)
Glucose, Bld: 73 mg/dL (ref 70–99)
Potassium: 3.7 mmol/L (ref 3.5–5.1)
Sodium: 148 mmol/L — ABNORMAL HIGH (ref 135–145)

## 2024-05-21 LAB — CBC
HCT: 35.6 % — ABNORMAL LOW (ref 39.0–52.0)
Hemoglobin: 11.2 g/dL — ABNORMAL LOW (ref 13.0–17.0)
MCH: 31.1 pg (ref 26.0–34.0)
MCHC: 31.5 g/dL (ref 30.0–36.0)
MCV: 98.9 fL (ref 80.0–100.0)
Platelets: 152 10*3/uL (ref 150–400)
RBC: 3.6 MIL/uL — ABNORMAL LOW (ref 4.22–5.81)
RDW: 18.1 % — ABNORMAL HIGH (ref 11.5–15.5)
WBC: 12.8 10*3/uL — ABNORMAL HIGH (ref 4.0–10.5)
nRBC: 0 % (ref 0.0–0.2)

## 2024-05-21 LAB — COOXEMETRY PANEL
Carboxyhemoglobin: 0.8 % (ref 0.5–1.5)
Methemoglobin: 0.7 % (ref 0.0–1.5)
O2 Saturation: 63.7 %
Total hemoglobin: 11 g/dL — ABNORMAL LOW (ref 12.0–16.0)

## 2024-05-21 MED ORDER — POTASSIUM CHLORIDE CRYS ER 20 MEQ PO TBCR
40.0000 meq | EXTENDED_RELEASE_TABLET | Freq: Once | ORAL | Status: AC
  Administered 2024-05-21: 40 meq via ORAL
  Filled 2024-05-21: qty 2

## 2024-05-21 MED ORDER — HYDROCORTISONE SOD SUC (PF) 100 MG IJ SOLR
100.0000 mg | Freq: Every day | INTRAMUSCULAR | Status: AC
  Administered 2024-05-22: 100 mg via INTRAVENOUS
  Filled 2024-05-21: qty 2

## 2024-05-21 MED ORDER — LIDOCAINE HCL (PF) 1 % IJ SOLN
INTRAMUSCULAR | Status: AC
Start: 2024-05-21 — End: 2024-05-21
  Administered 2024-05-21: 30 mL
  Filled 2024-05-21: qty 30

## 2024-05-21 MED ORDER — ALBUTEROL SULFATE (2.5 MG/3ML) 0.083% IN NEBU: 2.5000 mg | INHALATION_SOLUTION | RESPIRATORY_TRACT | Status: DC | PRN

## 2024-05-21 MED ORDER — INSULIN ASPART 100 UNIT/ML IJ SOLN
0.0000 [IU] | Freq: Three times a day (TID) | INTRAMUSCULAR | Status: DC
  Administered 2024-05-21: 5 [IU] via SUBCUTANEOUS
  Administered 2024-05-22: 3 [IU] via SUBCUTANEOUS
  Administered 2024-05-22 (×2): 5 [IU] via SUBCUTANEOUS

## 2024-05-21 MED ORDER — LEVOTHYROXINE SODIUM 25 MCG PO TABS
125.0000 ug | ORAL_TABLET | Freq: Every day | ORAL | Status: DC
  Administered 2024-05-22 – 2024-05-25 (×4): 125 ug via ORAL
  Filled 2024-05-21 (×4): qty 1

## 2024-05-21 MED ORDER — ORAL CARE MOUTH RINSE: 15.0000 mL | OROMUCOSAL | Status: DC | PRN

## 2024-05-21 NOTE — Procedures (Signed)
 Thoracentesis  Procedure Note  Iori Gigante  [id number]  [birth year]  Date:05/21/24  Time:2:34 PM   Provider Performing:Jodi Criscuolo C Felipe Horton   Procedure: Thoracentesis with imaging guidance (40981)  Indication(s) Pleural Effusion  Consent Risks of the procedure as well as the alternatives and risks of each were explained to the patient and/or caregiver.  Consent for the procedure was obtained and is signed in the bedside chart  Anesthesia Topical only with 1% lidocaine     Time Out Verified patient identification, verified procedure, site/side was marked, verified correct patient position, special equipment/implants available, medications/allergies/relevant history reviewed, required imaging and test results available.   Sterile Technique Maximal sterile technique including full sterile barrier drape, hand hygiene, sterile gown, sterile gloves, mask, hair covering, sterile ultrasound probe cover (if used).  Procedure Description Ultrasound was used to identify appropriate pleural anatomy for placement and overlying skin marked.  Area of drainage cleaned and draped in sterile fashion. Lidocaine  was used to anesthetize the skin and subcutaneous tissue.  Only 10 cc's of bloody appearing fluid was drained from the right pleural space. Catheter then removed and bandaid applied to site.   Complications/Tolerance Procedure tough due to radiation-induced changes causing scarring across subQ tissue overlying fluid pocket Chest X-ray is ordered to confirm no post-procedural complication.   EBL Minimal   Specimen(s) Pleural fluid

## 2024-05-21 NOTE — Evaluation (Signed)
 Physical Therapy Evaluation Patient Details Name: Paul [name] MRN: [id number] DOB: 10-26-1946 Today's Date: 05/21/2024  History of Present Illness  78 y/o male with who initially presented with SOB who then became unresponsive. Intubated on 5/20, extubated 5/23. 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 PTA pt was reportedly indep with use of SPC living in an apartment with a roommate that does not assist him.  Per dtr, [name], who lives in [place], pt hasn't been taking his medicines, has been eating mostly just peanut butter and jelly, and this "roommate" does not assist him with his needs. He has missed several MD appointments and had a recent admission back in 09/2023 with the same symptoms due to not taking his medication. Pt presenting with both cognitive and functional deficits and demo's inability to safely care for himself at home. Pt with poor memory, only oriented to self with no recall once re-oriented to place, date, and situation, kept calling his dtr "Mariah Shines" who is Tracy's mother however they were never together and do not have a relationship, in addition to demonstrating delayed processing, problem solving, and sequencing. Functionally pt is at significantly high fall risk as pt demo's ataxic like gait pattern requiring modA to steady in the walker. Pt with difficulty sequencing stepping during gait and step pvt transfer from EOB to [facility] then to recliner. Recommend inpatient rehab program < 3 hrs a day to allow for increased time to achieve safe level of function to either return to own apartment or possibly return home with dtr in [place] but who also works during the day. Acute PT to cont to follow.      If plan is discharge home, recommend the following: A lot of help with walking and/or transfers;A lot of help with  bathing/dressing/bathroom;Assist for transportation;Help with stairs or ramp for entrance;Supervision due to cognitive status;Direct supervision/assist for medications management;Direct supervision/assist for financial management   Can travel by private vehicle   No    Equipment Recommendations Rolling walker (2 wheels)  Recommendations for Other Services       Functional Status Assessment Patient has had a recent decline in their functional status and demonstrates the ability to make significant improvements in function in a reasonable and predictable amount of time.     Precautions / Restrictions Precautions Precautions: Fall Precaution/Restrictions Comments: impaired memory Restrictions Weight Bearing Restrictions Per Provider Order: No      Mobility  Bed Mobility Overal bed mobility: Needs Assistance Bed Mobility: Supine to Sit     Supine to sit: Min assist, HOB elevated, Used rails     General bed mobility comments: HOB elevated, minA to scoot hips to EOB, pulled trunk up with arm rests    Transfers Overall transfer level: Needs assistance Equipment used: Rolling walker (2 wheels) Transfers: Sit to/from Stand, Bed to chair/wheelchair/BSC Sit to Stand: Min assist, +2 safety/equipment   Step pivot transfers: Mod assist, +2 safety/equipment       General transfer comment: verbal cues to push up from bed, arm rests of BSC, not pull up on walker, wide base of support, minA to power up. modA to steady and for walker management during step pvt to [facility] and then to recliner, pt very unsteady with truncal and bilat knee instability/jerking type motions    Ambulation/Gait Ambulation/Gait assistance: Mod assist, +2 safety/equipment Gait Distance (Feet): 20  Feet Assistive device: Rolling walker (2 wheels) Gait Pattern/deviations: Step-to pattern, Decreased stride length, Trunk flexed, Wide base of support Gait velocity: dec Gait velocity interpretation: <1.31 ft/sec,  indicative of household ambulator   General Gait Details: pt with impaired co-ordination and sequencing of stepping, ataxic like gait pattern, modA to maintain stability, noted freq episodes of bilat knee instabiltiy/buckling requiring modA to prevent fall, 2nd person for line management  Stairs            Wheelchair Mobility     Tilt Bed    Modified Rankin (Stroke Patients Only)       Balance Overall balance assessment: Needs assistance Sitting-balance support: Feet supported, Bilateral upper extremity supported Sitting balance-Leahy Scale: Fair     Standing balance support: Bilateral upper extremity supported, During functional activity, Reliant on assistive device for balance Standing balance-Leahy Scale: Poor Standing balance comment: reliant on external support                             Pertinent Vitals/Pain Pain Assessment Pain Assessment: No/denies pain    Home Living Family/patient expects to be discharged to:: Private residence Living Arrangements: Alone Available Help at Discharge: Family;Friend(s);Available PRN/intermittently Type of Home: Apartment Home Access: Stairs to enter Entrance Stairs-Rails: None Entrance Stairs-Number of Steps: 2   Home Layout: One level Home Equipment: Cane - single point      Prior Function Prior Level of Function : Independent/Modified Independent;Driving             Mobility Comments: uses SPC, was driving ADLs Comments: per daughter who lives in [place] pt lives off of PB[employer], unsure how well he is bathing/caring for himself, misses alot of MD appointments     Extremity/Trunk Assessment   Upper Extremity Assessment Upper Extremity Assessment: Generalized weakness    Lower Extremity Assessment Lower Extremity Assessment: Generalized weakness (noted impaired co-ordination/sequencing bilaterally during amb)    Cervical / Trunk Assessment Cervical / Trunk Assessment: Normal  Communication    Communication Communication: Impaired Factors Affecting Communication: Reduced clarity of speech (soft spoken, delayed, muffled some)    Cognition Arousal: Alert Behavior During Therapy: Flat affect   PT - Cognitive impairments: Orientation, Awareness, Memory, Attention, Sequencing, Problem solving, Safety/Judgement   Orientation impairments: Place, Time, Situation                   PT - Cognition Comments: pt keeps calling [name] by her mother's name, Mariah Shines. per [name] they were never together Following commands: Impaired Following commands impaired: Follows one step commands with increased time     Cueing Cueing Techniques: Verbal cues, Tactile cues     General Comments General comments (skin integrity, edema, etc.): VSS, pt's pulse ox with poor pleth reading but noted to drop to 86% on RA and stayed > 92% on 3LO2 via [place], pt assisted to [facility], pt dependent for hygiene s/p BM    Exercises     Assessment/Plan    PT Assessment Patient needs continued PT services  PT Problem List Decreased strength;Decreased activity tolerance;Decreased balance;Decreased mobility;Decreased coordination;Decreased cognition;Decreased knowledge of use of DME       PT Treatment Interventions DME instruction;Gait training;Functional mobility training;Stair training;Therapeutic activities;Balance training;Neuromuscular re-education;Therapeutic exercise    PT Goals (Current goals can be found in the Care Plan section)  Acute Rehab PT Goals Patient Stated Goal: didn't state PT Goal Formulation: With patient/family Time For Goal Achievement: 06/04/24 Potential to Achieve Goals: Good  Frequency Min 2X/week     Co-evaluation               AM-PAC PT "6 Clicks" Mobility  Outcome Measure Help needed turning from your back to your side while in a flat bed without using bedrails?: A Little Help needed moving from lying on your back to sitting on the side of a flat bed without  using bedrails?: A Little Help needed moving to and from a bed to a chair (including a wheelchair)?: A Lot Help needed standing up from a chair using your arms (e.g., wheelchair or bedside chair)?: A Lot Help needed to walk in hospital room?: A Lot Help needed climbing 3-5 steps with a railing? : Total 6 Click Score: 13    End of Session Equipment Utilized During Treatment: Gait belt;Oxygen Activity Tolerance: Patient tolerated treatment well Patient left: in chair;with call bell/phone within reach;with family/visitor present Nurse Communication: Mobility status (+ BM) PT Visit Diagnosis: Unsteadiness on feet (R26.81);Muscle weakness (generalized) (M62.81)    Time: 1610-9604 PT Time Calculation (min) (ACUTE ONLY): 66 min   Charges:   PT Evaluation $PT Eval Moderate Complexity: 1 Mod PT Treatments $Gait Training: 8-22 mins $Therapeutic Activity: 8-22 mins $Self Care/Home Management: 8-22 PT General Charges $$ ACUTE PT VISIT: 1 Visit         Renaee Caro, PT, DPT Acute Rehabilitation Services Secure chat preferred Office #: [phone]   Jenna Moan 05/21/2024, 11:38 AM

## 2024-05-21 NOTE — Evaluation (Signed)
 Clinical/Bedside Swallow Evaluation Patient Details  Name: Paul [name] MRN: [id number] Date of Birth: [birth year]  Today's Date: 05/21/2024 Time: SLP Start Time (ACUTE ONLY): 0840 SLP Stop Time (ACUTE ONLY): 0900 SLP Time Calculation (min) (ACUTE ONLY): 20 min  Past Medical History:  Past Medical History:  Diagnosis Date   CHF (congestive heart failure) (HCC)    Coronary artery disease    Diabetes mellitus without complication (HCC)    History of radiation therapy    Right lung- 11/27/21-01/10/22- Dr. Retta Caster   Hypertension    Hypothyroidism    Ischemic cardiomyopathy    Pneumonia    a long time ago   Sleep apnea    no longer uses a cpap   Past Surgical History:  Past Surgical History:  Procedure Laterality Date   BRONCHIAL BIOPSY  06/04/2021   Procedure: BRONCHIAL BIOPSIES;  Surgeon: Prudy Brownie, DO;  Location: [facility];  Service: Pulmonary;;   BRONCHIAL BRUSHINGS  06/04/2021   Procedure: BRONCHIAL BRUSHINGS;  Surgeon: Prudy Brownie, DO;  Location: [facility];  Service: Pulmonary;;   BRONCHIAL NEEDLE ASPIRATION BIOPSY  06/04/2021   Procedure: BRONCHIAL NEEDLE ASPIRATION BIOPSIES;  Surgeon: Prudy Brownie, DO;  Location: [facility];  Service: Pulmonary;;   BRONCHIAL WASHINGS  06/04/2021   Procedure: BRONCHIAL WASHINGS;  Surgeon: Prudy Brownie, DO;  Location: [facility];  Service: Pulmonary;;   CARDIAC SURGERY     COLONOSCOPY     CORONARY ARTERY BYPASS GRAFT     2019 at [facility]   VIDEO BRONCHOSCOPY WITH ENDOBRONCHIAL NAVIGATION Right 06/04/2021   Procedure: VIDEO BRONCHOSCOPY WITH ENDOBRONCHIAL NAVIGATION;  Surgeon: Prudy Brownie, DO;  Location: [facility];  Service: Pulmonary;  Laterality: Right;   VIDEO BRONCHOSCOPY WITH ENDOBRONCHIAL ULTRASOUND  06/04/2021   Procedure: VIDEO BRONCHOSCOPY WITH ENDOBRONCHIAL ULTRASOUND;  Surgeon: Prudy Brownie, DO;  Location: [facility];  Service: Pulmonary;;   HPI:  Paul [name] is a 78 y/o gentleman with  a history of lung cancer with likely recurrence, DM, HFrEF who presented with confusion and respiratory failure found to have septic shock and pneumonia. He required intubation for lethargy 5/21-5/23    Assessment / Plan / Recommendation  Clinical Impression  Pt demonstrates immediate and delayed coughing after large volume intake of water. Has multiple risk factors for dyspahgia and is vulnerable to aspiration pna. Will proceed with FEES for instrumental assessment of swallowing. Pt and family in agreement. Pt may have small sips of water, pills in puree as needed this am until study complete. SLP Visit Diagnosis: Dysphagia, unspecified (R13.10)    Aspiration Risk  Moderate aspiration risk    Diet Recommendation NPO except meds;Ice chips PRN after oral care    Liquid Administration via: Cup Medication Administration: Whole meds with puree    Other  Recommendations Oral Care Recommendations: Oral care QID    Recommendations for follow up therapy are one component of a multi-disciplinary discharge planning process, led by the attending physician.  Recommendations may be updated based on patient status, additional functional criteria and insurance authorization.  Follow up Recommendations        Assistance Recommended at Discharge    Functional Status Assessment    Frequency and Duration            Prognosis        Swallow Study   General HPI: Paul [name] is a 78 y/o gentleman with a history of lung cancer with likely recurrence, DM, HFrEF who presented with confusion and respiratory  failure found to have septic shock and pneumonia. He required intubation for lethargy 5/21-5/23 Type of Study: Bedside Swallow Evaluation Diet Prior to this Study: Other (Comment);NPO History of Recent Intubation: No Behavior/Cognition: Alert;Cooperative Oral Cavity Assessment: Within Functional Limits Oral Care Completed by SLP: No Oral Cavity - Dentition: Poor condition Vision: Functional for  self-feeding Self-Feeding Abilities: Able to feed self Patient Positioning: Upright in bed Baseline Vocal Quality: Normal Volitional Cough: Strong Volitional Swallow: Able to elicit    Oral/Motor/Sensory Function Overall Oral Motor/Sensory Function: Within functional limits   Ice Chips Ice chips: Within functional limits   Thin Liquid Thin Liquid: Impaired Presentation: Straw Pharyngeal  Phase Impairments: Cough - Immediate;Cough - Delayed    Nectar Thick Nectar Thick Liquid: Not tested   Honey Thick Honey Thick Liquid: Not tested   Puree Puree: Within functional limits Presentation: Spoon   Solid     Solid: Within functional limits Presentation: Self Fed      Paul [name], [name] 05/21/2024,9:19 AM

## 2024-05-21 NOTE — Telephone Encounter (Signed)
 4 week f/u with RB for abnormal PET

## 2024-05-21 NOTE — Plan of Care (Signed)
  Problem: Education: Goal: Ability to describe self-care measures that may prevent or decrease complications (Diabetes Survival Skills Education) will improve Outcome: Progressing   Problem: Fluid Volume: Goal: Ability to maintain a balanced intake and output will improve Outcome: Progressing   Problem: Skin Integrity: Goal: Risk for impaired skin integrity will decrease Outcome: Progressing

## 2024-05-21 NOTE — Progress Notes (Signed)
 NAME:  Paul [name], MRN:  [id number], DOB:  [birth year], LOS: 3 ADMISSION DATE:  05/17/2024, CONSULTATION DATE:  05/18/2024 REFERRING MD:  Hershel Los, MD, CHIEF COMPLAINT:  SOB  History of Present Illness:  Patient seen in ED Trauma B with NP Hoffman.  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  His initial sat on RA was 87% and he was placed on CPPA by EMS who also gave him 1 neb, 125mg  Solumedrol and 2mg  Mg.  In the ED his mental status initially was poor but then he apparently perked up a little.  He had not complained of Chest paina in ED.  He was placed on 20/10 cwp BiPAP in ED with 40% FIO2.  He was having apneic episodes in the ED on BiPAP.  He was started on Levophed up to 10mcg and his BP slowly improved to 103 but his HR was in high 40's and low 50's.  Patient mental status again worsened and he was not as responsive.  He was hypothermic in ED as well 95 degrees. ED labs: Ph 3.328/PCO2 52/PO2 212, Cr 1.89, LA 1.8, BNP 249, trop 38. Cxr showing officially mild pulmonary edema/congestion.  However, RLL infiltrate to be ruled out.  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 CAP coverage. TSH > 70. Severe BiV failure. NE vaso   5/22 checking PCT, diuresing. Weaning NE   Interim History / Subjective:  Off pressors Passed swallow  Objective    Blood pressure 118/69, pulse 70, temperature 97.7 F (36.5 C), temperature source Oral, resp. rate (!) 28,  height 5\' 8"  (1.727 m), weight 109.3 kg, SpO2 100%. CVP:  [0 mmHg-63 mmHg] 8 mmHg      Intake/Output Summary (Last 24 hours) at 05/21/2024 1336 Last data filed at 05/21/2024 0700 Gross per 24 hour  Intake 594.18 ml  Output 975 ml  Net -380.82 ml   Filed Weights   05/19/24 0500 05/20/24 0500 05/21/24 0500  Weight: 111 kg 106.2 kg 109.3 kg    Examination: No distress Lungs sounds okay Ext warm Moves to command Foley and CVL in place  Resolved problem list   Assessment and Plan    Acute encephalopathy, improving- high TSH and noncompliance with synthroid  noted Hx PTSD  Acute resp failure w hypoxia and hypercarbia  CAP v pulm edema  Stg 3b adeno R lung, 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) not currently on tx Abnormal PET scan Shock resolved Acute on chronic HFrEF- now euvolemic DM2 exacerbated by steroids- now hypoglycemic and variable PO Memory issues PTA- will need re-eval once thyroid  is addressed Medication/appt noncompliance- daughter will work on this Heavy smoker, COPD not in flare  - Continue synthroid , switch to PO - Wean off stress steroids - Insulin  to SSI - Complete 7 days ceftriaxone  for possible CAP - TSH recheck tomorrow - PT/OT eval - Nebs to PRN albuterol  - RE PET scan, R effusion, will US  and consider  diagnostic tap otherwise will need to consider bronch; noted [facility] at t+3 months but that would have been April; patient did not go to this appt; will arrange pulm f/u with RB in 4 weeks  Okay for stepdown, appreciate [facility] taking over 5/25 Remaining issues: SNF placement, O2 wean, insulin  adjustments  If tap performed I will call daughter if positive.  Ardelle Kos MD PCCM

## 2024-05-21 NOTE — Procedures (Signed)
 Objective Swallowing Evaluation: Type of Study: FEES-Fiberoptic Endoscopic Evaluation of Swallow   Patient Details  Name: Paul [name] MRN: [id number] Date of Birth: [birth year]  Today's Date: 05/21/2024 Time: SLP Start Time (ACUTE ONLY): 1100 -SLP Stop Time (ACUTE ONLY): 1145  SLP Time Calculation (min) (ACUTE ONLY): 45 min   Past Medical History:  Past Medical History:  Diagnosis Date   CHF (congestive heart failure) (HCC)    Coronary artery disease    Diabetes mellitus without complication (HCC)    History of radiation therapy    Right lung- 11/27/21-01/10/22- Dr. Retta Caster   Hypertension    Hypothyroidism    Ischemic cardiomyopathy    Pneumonia    a long time ago   Sleep apnea    no longer uses a cpap   Past Surgical History:  Past Surgical History:  Procedure Laterality Date   BRONCHIAL BIOPSY  06/04/2021   Procedure: BRONCHIAL BIOPSIES;  Surgeon: Prudy Brownie, DO;  Location: [facility];  Service: Pulmonary;;   BRONCHIAL BRUSHINGS  06/04/2021   Procedure: BRONCHIAL BRUSHINGS;  Surgeon: Prudy Brownie, DO;  Location: [facility];  Service: Pulmonary;;   BRONCHIAL NEEDLE ASPIRATION BIOPSY  06/04/2021   Procedure: BRONCHIAL NEEDLE ASPIRATION BIOPSIES;  Surgeon: Prudy Brownie, DO;  Location: [facility];  Service: Pulmonary;;   BRONCHIAL WASHINGS  06/04/2021   Procedure: BRONCHIAL WASHINGS;  Surgeon: Prudy Brownie, DO;  Location: [facility];  Service: Pulmonary;;   CARDIAC SURGERY     COLONOSCOPY     CORONARY ARTERY BYPASS GRAFT     2019 at [facility]   VIDEO BRONCHOSCOPY WITH ENDOBRONCHIAL NAVIGATION Right 06/04/2021   Procedure: VIDEO BRONCHOSCOPY WITH ENDOBRONCHIAL NAVIGATION;  Surgeon: Prudy Brownie, DO;  Location: [facility];  Service: Pulmonary;  Laterality: Right;   VIDEO BRONCHOSCOPY WITH ENDOBRONCHIAL ULTRASOUND  06/04/2021   Procedure: VIDEO BRONCHOSCOPY WITH ENDOBRONCHIAL ULTRASOUND;  Surgeon: Prudy Brownie, DO;  Location: [facility];   Service: Pulmonary;;   HPI: Paul [name] is a 78 y/o gentleman with a history of lung cancer with likely recurrence, DM, HFrEF who presented with confusion and respiratory failure found to have septic shock and pneumonia. He required intubation for lethargy 5/21-5/23   No data recorded   Recommendations for follow up therapy are one component of a multi-disciplinary discharge planning process, led by the attending physician.  Recommendations may be updated based on patient status, additional functional criteria and insurance authorization.  Assessment / Plan / Recommendation     05/21/2024   11:00 AM  Clinical Impressions  Clinical Impression Pt demonstrates adequate airway protection throughout FEES though were was mild residue above the PES consistently; question decrease PES relaxation. Pt also noted to have edmatous vestibular folds (reinke's edema), arytenoids, aryepiglottic folds and pachyderma. 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 There could also be inflammation at the PES that would account for residue. Pt to initaite a regular diet and thin liquids. May benefit from f/u with ENT or GI to address signs of GER.  SLP Visit Diagnosis Dysphagia, unspecified (R13.10)  Attention and concentration deficit following --  Frontal lobe and executive function deficit following --  Impact on safety and function Moderate aspiration risk         05/21/2024   11:00 AM  Treatment Recommendations  Treatment Recommendations Therapy as outlined in treatment plan below         No data to  display             05/21/2024   11:00 AM  Diet Recommendations  SLP Diet Recommendations Regular solids;Thin liquid  Liquid Administration via Cup;Straw  Medication Administration Whole meds with liquid  Compensations Slow rate;Small sips/bites  Postural Changes Seated upright at 90 degrees;Remain  semi-upright after after feeds/meals (Comment)         05/21/2024   11:00 AM  Other Recommendations  Recommended Consults --  Oral Care Recommendations Oral care BID  Caregiver Recommendations --  Follow Up Recommendations Follow physician's recommendations for discharge plan and follow up therapies  Assistance recommended at discharge --  Functional Status Assessment --       05/21/2024   11:00 AM  Frequency and Duration   Speech Therapy Frequency (ACUTE ONLY) min 1 x/week  Treatment Duration 1 week         05/21/2024   11:00 AM  Oral Phase  Oral Phase Impaired  Oral - Pudding Teaspoon --  Oral - Pudding Cup --  Oral - Honey Teaspoon --  Oral - Honey Cup --  Oral - Nectar Teaspoon --  Oral - Nectar Cup --  Oral - Nectar Straw --  Oral - Thin Teaspoon --  Oral - Thin Cup --  Oral - Thin Straw --  Oral - Puree WFL  Oral - Mech Soft --  Oral - Regular Delayed oral transit  Oral - Multi-Consistency --  Oral - Pill --  Oral Phase - Comment --       05/21/2024   11:00 AM  Pharyngeal Phase  Pharyngeal Phase Impaired  Pharyngeal- Pudding Teaspoon --  Pharyngeal --  Pharyngeal- Pudding Cup --  Pharyngeal --  Pharyngeal- Honey Teaspoon --  Pharyngeal --  Pharyngeal- Honey Cup --  Pharyngeal --  Pharyngeal- Nectar Teaspoon --  Pharyngeal --  Pharyngeal- Nectar Cup --  Pharyngeal --  Pharyngeal- Nectar Straw --  Pharyngeal --  Pharyngeal- Thin Teaspoon --  Pharyngeal --  Pharyngeal- Thin Cup Pharyngeal residue - cp segment  Pharyngeal --  Pharyngeal- Thin Straw Pharyngeal residue - cp segment  Pharyngeal --  Pharyngeal- Puree WFL  Pharyngeal --  Pharyngeal- Mechanical Soft WFL  Pharyngeal --  Pharyngeal- Regular WFL  Pharyngeal --  Pharyngeal- Multi-consistency --  Pharyngeal --  Pharyngeal- Pill --  Pharyngeal --  Pharyngeal Comment --         No data to display           Cari Burgo, [name] 05/21/2024, 12:03 PM

## 2024-05-22 DIAGNOSIS — R579 Shock, unspecified: Secondary | ICD-10-CM

## 2024-05-22 LAB — BASIC METABOLIC PANEL WITH GFR
Anion gap: 9 (ref 5–15)
BUN: 47 mg/dL — ABNORMAL HIGH (ref 8–23)
CO2: 26 mmol/L (ref 22–32)
Calcium: 8.5 mg/dL — ABNORMAL LOW (ref 8.9–10.3)
Chloride: 110 mmol/L (ref 98–111)
Creatinine, Ser: 2.1 mg/dL — ABNORMAL HIGH (ref 0.61–1.24)
GFR, Estimated: 31 mL/min — ABNORMAL LOW (ref >60)
Glucose, Bld: 171 mg/dL — ABNORMAL HIGH (ref 70–99)
Potassium: 4.1 mmol/L (ref 3.5–5.1)
Sodium: 145 mmol/L (ref 135–145)

## 2024-05-22 LAB — CULTURE, BLOOD (ROUTINE X 2): Culture: NO GROWTH

## 2024-05-22 LAB — RENAL FUNCTION PANEL
Albumin: 3 g/dL — ABNORMAL LOW (ref 3.5–5.0)
Anion gap: 8 (ref 5–15)
BUN: 47 mg/dL — ABNORMAL HIGH (ref 8–23)
CO2: 26 mmol/L (ref 22–32)
Calcium: 8.4 mg/dL — ABNORMAL LOW (ref 8.9–10.3)
Chloride: 110 mmol/L (ref 98–111)
Creatinine, Ser: 2.07 mg/dL — ABNORMAL HIGH (ref 0.61–1.24)
GFR, Estimated: 32 mL/min — ABNORMAL LOW (ref >60)
Glucose, Bld: 168 mg/dL — ABNORMAL HIGH (ref 70–99)
Phosphorus: 3.3 mg/dL (ref 2.5–4.6)
Potassium: 4.1 mmol/L (ref 3.5–5.1)
Sodium: 144 mmol/L (ref 135–145)

## 2024-05-22 LAB — CBC
HCT: 34.8 % — ABNORMAL LOW (ref 39.0–52.0)
Hemoglobin: 11 g/dL — ABNORMAL LOW (ref 13.0–17.0)
MCH: 30.7 pg (ref 26.0–34.0)
MCHC: 31.6 g/dL (ref 30.0–36.0)
MCV: 97.2 fL (ref 80.0–100.0)
Platelets: 146 10*3/uL — ABNORMAL LOW (ref 150–400)
RBC: 3.58 MIL/uL — ABNORMAL LOW (ref 4.22–5.81)
RDW: 17.9 % — ABNORMAL HIGH (ref 11.5–15.5)
WBC: 11.2 10*3/uL — ABNORMAL HIGH (ref 4.0–10.5)
nRBC: 0 % (ref 0.0–0.2)

## 2024-05-22 LAB — GLUCOSE, CAPILLARY
Glucose-Capillary: 152 mg/dL — ABNORMAL HIGH (ref 70–99)
Glucose-Capillary: 222 mg/dL — ABNORMAL HIGH (ref 70–99)
Glucose-Capillary: 225 mg/dL — ABNORMAL HIGH (ref 70–99)
Glucose-Capillary: 315 mg/dL — ABNORMAL HIGH (ref 70–99)

## 2024-05-22 LAB — TSH: TSH: 27.587 u[IU]/mL — ABNORMAL HIGH (ref 0.350–4.500)

## 2024-05-22 LAB — T3, FREE: T3, Free: 0.5 pg/mL — ABNORMAL LOW (ref 2.0–4.4)

## 2024-05-22 MED ORDER — INSULIN ASPART 100 UNIT/ML IJ SOLN
0.0000 [IU] | Freq: Three times a day (TID) | INTRAMUSCULAR | Status: DC
  Administered 2024-05-22: 11 [IU] via SUBCUTANEOUS
  Administered 2024-05-23: 5 [IU] via SUBCUTANEOUS
  Administered 2024-05-23: 2 [IU] via SUBCUTANEOUS
  Administered 2024-05-23 (×2): 3 [IU] via SUBCUTANEOUS
  Administered 2024-05-24: 5 [IU] via SUBCUTANEOUS

## 2024-05-22 NOTE — Plan of Care (Signed)
   Problem: Nutritional: Goal: Maintenance of adequate nutrition will improve Outcome: Progressing Goal: Progress toward achieving an optimal weight will improve Outcome: Progressing

## 2024-05-22 NOTE — Progress Notes (Signed)
 PROGRESS NOTE    Paul [name]  OZD:[id number] DOB: 14-Dec-1946 DOA: 05/17/2024 PCP: [facility]   Brief Narrative:  This 78 years old male with PMH significant for type 2 diabetes on insulin , HFrEF 25%, lung cancer, active smoker, hypertension, CKD stage IIIa, hyperlipidemia, hypothyroidism, CAD status post CABG, COPD not on home oxygen, OSA presented in the ED with c/o: Shortness of breath since yesterday.  He was hypoxic with SpO2 of 87% on room air and was placed on CPAP.  Patient was given Solu-Medrol ,  magnesium sulfate and nebulization.  Patient continued to have apneic spells, He was placed on BiPAP. He was also started on Levophed support for hypotension. Patient's mental status has worsened and was not responsive,  Subsequently patient was admitted in the ICU. TRH pickup 05/22/2024.  Assessment & Plan:   Principal Problem:   Shock (HCC) Active Problems:   Sepsis (HCC)  Acute hypoxic and hypercapnic respiratory failure: Acute encephalopathy likely multifactorial. Community-acquired pneumonia Patient presented with worsening shortness of breath.   He was hypoxic with SpO2 of 87% on room air requiring BiPAP. Successfully weaned off of Levophed. Wean off stress dose steroids. Continue ceftriaxone  for 7 days for possible Community-acquired pneumonia. Continue nebs as needed. Patient underwent successful thoracocentesis for persistent pleural effusion. He is now on 2 L of supplemental oxygen sats 90%. PT and OT evaluation.  Acute on chronic HFr EF: Patient is now euvolemic. Continue home medications.  Type 2 diabetes: Blood sugar exaggerated by steroids.   Now hypoglycemic.  Encourage oral intake.  History of lung cancer Currently not on chemotherapy.  Next follow-up outpatient.  Hypothyroidism: Continue Synthroid .  Check TSH level tomorrow   DVT prophylaxis: Heparin  sq Code Status: Full code Family Communication: No family at bed side.  Disposition  Plan: Status is: Inpatient Remains inpatient appropriate because: Severity of illness    Consultants:  Pulmonology  Procedures: Thoracocentesis  Antimicrobials:  Anti-infectives (From admission, onward)    Start     Dose/Rate Route Frequency Ordered Stop   05/18/24 1000  cefTRIAXone  (ROCEPHIN ) 1 g in sodium chloride  0.9 % 100 mL IVPB  Status:  Discontinued        1 g 200 mL/hr over 30 Minutes Intravenous Every 24 hours 05/18/24 0058 05/18/24 0914   05/18/24 1000  cefTRIAXone  (ROCEPHIN ) 2 g in sodium chloride  0.9 % 100 mL IVPB        2 g 200 mL/hr over 30 Minutes Intravenous Every 24 hours 05/18/24 0914     05/18/24 0400  azithromycin  (ZITHROMAX ) 500 mg in sodium chloride  0.9 % 250 mL IVPB  Status:  Discontinued        500 mg 250 mL/hr over 60 Minutes Intravenous Every 24 hours 05/18/24 0058 05/21/24 1342   05/17/24 2315  vancomycin (VANCOREADY) IVPB 2000 mg/400 mL        2,000 mg 200 mL/hr over 120 Minutes Intravenous  Once 05/17/24 2301 05/18/24 0425   05/17/24 2300  ceFEPIme (MAXIPIME) 2 g in sodium chloride  0.9 % 100 mL IVPB        2 g 200 mL/hr over 30 Minutes Intravenous  Once 05/17/24 2248 05/17/24 2349   05/17/24 2300  metroNIDAZOLE (FLAGYL) IVPB 500 mg        500 mg 100 mL/hr over 60 Minutes Intravenous  Once 05/17/24 2248 05/18/24 0249   05/17/24 2300  vancomycin (VANCOCIN) IVPB 1000 mg/200 mL premix  Status:  Discontinued        1,000 mg 200 mL/hr over  60 Minutes Intravenous  Once 05/17/24 2248 05/17/24 2301      Subjective: Patient was seen and examined at bedside.  Overnight events noted. Patient was lying comfortably in the bed,  states he is feeling better. He underwent thoracocentesis.  Feels much improved afterwards.  Objective: Vitals:   05/21/24 2326 05/22/24 0315 05/22/24 0833 05/22/24 1125  BP: (!) 144/83 130/81  126/80  Pulse: 69 65 63   Resp: (!) 22 (!) 21 18 18   Temp: (!) 97.4 F (36.3 C) 97.6 F (36.4 C)  98.3 F (36.8 C)  TempSrc: Oral Oral   Oral  SpO2: 100% 98% 100%   Weight:  109.3 kg    Height:        Intake/Output Summary (Last 24 hours) at 05/22/2024 1152 Last data filed at 05/22/2024 0959 Gross per 24 hour  Intake 700 ml  Output 700 ml  Net 0 ml   Filed Weights   05/21/24 1857 05/21/24 1928 05/22/24 0315  Weight: 107.8 kg 109 kg 109.3 kg    Examination:  General exam: Appears calm and comfortable , not in any acute distress. Respiratory system: Clear to auscultation. Respiratory effort normal.  RR 16 Cardiovascular system: S1 & S2 heard, RRR. No JVD, murmurs, rubs, gallops or clicks.  Gastrointestinal system: Abdomen is non distended, soft and non tender.  Normal bowel sounds heard. Central nervous system: Alert and oriented x 3. No focal neurological deficits. Extremities: No edema, no cyanosis, no clubbing Skin: No rashes, lesions or ulcers Psychiatry: Judgement and insight appear normal. Mood & affect appropriate.     Data Reviewed: I have personally reviewed following labs and imaging studies  CBC: Recent Labs  Lab 05/17/24 2146 05/17/24 2209 05/18/24 0022 05/18/24 0056 05/18/24 0253 05/18/24 0500 05/21/24 0444 05/22/24 0229  WBC 4.6  --  4.5 4.6  --  4.8 12.8* 11.2*  NEUTROABS 2.9  --   --   --   --   --   --   --   HGB 12.0*   < > 10.2* 10.2* 9.9* 10.2* 11.2* 11.0*  HCT 37.7*   < > 32.5* 32.4* 29.0* 32.5* 35.6* 34.8*  MCV 99.5  --  99.7 98.8  --  99.1 98.9 97.2  PLT 163  --  152 171  --  180 152 146*   < > = values in this interval not displayed.   Basic Metabolic Panel: Recent Labs  Lab 05/18/24 0022 05/18/24 0056 05/18/24 0500 05/18/24 1649 05/19/24 0515 05/19/24 1834 05/20/24 0408 05/21/24 0444 05/22/24 0229 05/22/24 0231  NA 147*   < > 144  --  143  --  143 148* 145 144  K 4.3   < > 4.6  --  4.8  --  3.6 3.7 4.1 4.1  CL 114*  --  112*  --  109  --  106 109 110 110  CO2 26  --  25  --  21*  --  25 29 26 26   GLUCOSE 96  --  127*  --  214*  --  262* 73 171* 168*  BUN 28*   --  29*  --  44*  --  55* 51* 47* 47*  CREATININE 1.91*   < > 2.08*  --  2.63*  --  2.67* 2.24* 2.10* 2.07*  CALCIUM  9.1  --  8.5*  --  8.5*  --  8.4* 8.8* 8.5* 8.4*  MG 2.5*  --  2.5* 2.2 2.2 2.2  --   --   --   --  PHOS 3.6  --  3.3 4.2 4.8* 4.3  --   --   --  3.3   < > = values in this interval not displayed.   GFR: Estimated Creatinine Clearance: 34.7 mL/min (A) (by C-G formula based on SCr of 2.07 mg/dL (H)). Liver Function Tests: Recent Labs  Lab 05/17/24 2309 05/22/24 0231  AST 47*  --   ALT 27  --   ALKPHOS 71  --   BILITOT 0.8  --   PROT 6.5  --   ALBUMIN 3.2* 3.0*   No results for input(s): "LIPASE", "AMYLASE" in the last 168 hours. No results for input(s): "AMMONIA" in the last 168 hours. Coagulation Profile: Recent Labs  Lab 05/17/24 2309  INR 1.3*   Cardiac Enzymes: No results for input(s): "CKTOTAL", "CKMB", "CKMBINDEX", "TROPONINI" in the last 168 hours. BNP (last 3 results) No results for input(s): "PROBNP" in the last 8760 hours. HbA1C: No results for input(s): "HGBA1C" in the last 72 hours. CBG: Recent Labs  Lab 05/21/24 1201 05/21/24 1640 05/21/24 2124 05/22/24 0744 05/22/24 1123  GLUCAP 109* 223* 216* 152* 222*   Lipid Profile: No results for input(s): "CHOL", "HDL", "LDLCALC", "TRIG", "CHOLHDL", "LDLDIRECT" in the last 72 hours. Thyroid  Function Tests: Recent Labs    05/20/24 0408 05/22/24 0229  TSH 42.587* 27.587*  FREET4 0.34*  --    Anemia Panel: No results for input(s): "VITAMINB12", "FOLATE", "FERRITIN", "TIBC", "IRON", "RETICCTPCT" in the last 72 hours. Sepsis Labs: Recent Labs  Lab 05/17/24 2315 05/18/24 0028 05/18/24 1838 05/18/24 2126 05/19/24 0515  PROCALCITON  --   --   --   --  0.16  LATICACIDVEN 1.6 1.8 1.5 1.6  --     Recent Results (from the past 240 hours)  Blood Culture (routine x 2)     Status: None   Collection Time: 05/17/24 10:59 PM   Specimen: BLOOD LEFT ARM  Result Value Ref Range Status   Specimen  Description BLOOD LEFT ARM  Final   Special Requests   Final    BOTTLES DRAWN AEROBIC AND ANAEROBIC Blood Culture results may not be optimal due to an inadequate volume of blood received in culture bottles   Culture   Final    NO GROWTH 5 DAYS Performed at [facility], [address] [place], [place] [address]    Report Status 05/22/2024 FINAL  Final  Blood Culture (routine x 2)     Status: None (Preliminary result)   Collection Time: 05/18/24 12:19 AM   Specimen: BLOOD LEFT HAND  Result Value Ref Range Status   Specimen Description BLOOD LEFT HAND  Final   Special Requests   Final    BOTTLES DRAWN AEROBIC AND ANAEROBIC Blood Culture results may not be optimal due to an inadequate volume of blood received in culture bottles   Culture   Final    NO GROWTH 4 DAYS Performed at [facility], [address], [place], [place] [address]    Report Status PENDING  Incomplete  MRSA Next Gen by PCR, Nasal     Status: None   Collection Time: 05/18/24  1:32 AM   Specimen: Nasal Mucosa; Nasal Swab  Result Value Ref Range Status   MRSA by PCR Next Gen NOT DETECTED NOT DETECTED Final    Comment: (NOTE) The GeneXpert MRSA Assay (FDA approved for NASAL specimens only), is one component of a comprehensive MRSA colonization surveillance program. It is not intended to diagnose MRSA infection nor  to guide or monitor treatment for MRSA infections. Test performance is not FDA approved in patients less than 36 years old. Performed at [facility], [address] [place], [place] [address]     Radiology Studies: DG Chest Port 1 View Result Date: 05/21/2024 CLINICAL DATA:  Status post thoracentesis. History of non-small-cell lung cancer EXAM: PORTABLE CHEST 1 VIEW COMPARISON:  Chest x-ray 05/18/2024 and older FINDINGS: Enteric tube no longer seen. Stable right IJ catheter. Enlarged cardiopericardial silhouette with some vascular congestion. Sternal wires. Small right pleural effusion  and tiny left. Adjacent lung opacities. No pneumothorax seen. Overlapping cardiac leads. IMPRESSION: No pneumothorax seen post thoracentesis. Small right and tiny left pleural effusion with adjacent opacities. Postop chest with vascular congestion.  Enlarged heart. Right IJ catheter. Electronically Signed   By: Adrianna Horde M.D.   On: 05/21/2024 15:08   Scheduled Meds:  Chlorhexidine  Gluconate Cloth  6 each Topical Daily   feeding supplement (PROSource TF20)  60 mL Per Tube Daily   heparin   5,000 Units Subcutaneous Q8H   insulin  aspart  0-15 Units Subcutaneous TID WC   levothyroxine   125 mcg Oral Q0600   revefenacin  175 mcg Nebulization Daily   Continuous Infusions:  cefTRIAXone  (ROCEPHIN )  IV 2 g (05/22/24 1014)   feeding supplement (VITAL 1.5 CAL) 50 mL/hr at 05/20/24 1600   norepinephrine (LEVOPHED) Adult infusion Stopped (05/20/24 1332)     LOS: 4 days    Time spent: 50 Mins    Magdalene School, MD [facility]   If 7PM-7AM, please contact night-coverage

## 2024-05-22 NOTE — TOC Progression Note (Signed)
 Transition of Care [facility]) - Progression Note    Patient Details  Name: Paul [name] MRN: [id number] Date of Birth: 1946/02/12  [facility] of [facility]) CM/SW Contact  Jarrell Merritts, LCSW Phone Number: 05/22/2024, 4:20 PM  Clinical Narrative:     CSW spoke with pt's daughter Paul [name] in regard to the SNF recommendation. Paul [name] is pt's HCPOA and is in agreement. Paul [name] states that she does not want pt to go to [facility] as he was previously there. CSW provided [place] with the Medicare.gov website to view the rating once the bed offer come back.CSW answered questions about the SNF process. Paul [name] informed CSW that she would like to use pt's Medicare Benefit.   TOC team will continue to assist with discharge planning needs.    Expected Discharge Plan: Skilled Nursing Facility Barriers to Discharge: Continued Medical Work up  Expected Discharge Plan and Services                                               Social Determinants of Health (SDOH) Interventions SDOH Screenings   Food Insecurity: Patient Unable To Answer (05/18/2024)  Housing: Patient Unable To Answer (05/18/2024)  Transportation Needs: Patient Unable To Answer (05/18/2024)  Utilities: Patient Unable To Answer (05/18/2024)  Financial Resource Strain: Low Risk  (02/03/2023)   Received from [facility], [facility]  Social Connections: Patient Unable To Answer (05/18/2024)  Stress: No Stress Concern Present (02/03/2023)   Received from [facility], [facility]  Tobacco Use: High Risk (10/06/2023)    Readmission Risk Interventions    04/08/2023    4:58 PM  Readmission Risk Prevention Plan  Transportation Screening Complete  PCP or Specialist Appt within 5-7 Days Complete  Home Care Screening Complete  Medication Review (RN CM) Complete

## 2024-05-22 NOTE — Plan of Care (Signed)
  Problem: Education: Goal: Ability to describe self-care measures that may prevent or decrease complications (Diabetes Survival Skills Education) will improve 05/22/2024 2013 by Alric Jensen, Shima Compere G, RN Outcome: Progressing 05/22/2024 2011 by Alric Jensen, Addison Ade, RN Outcome: Progressing Goal: Individualized Educational Video(s) 05/22/2024 2013 by Alric Jensen, Kaelon Weekes G, RN Outcome: Progressing 05/22/2024 2011 by Alric Jensen, Addison Ade, RN Outcome: Progressing   Problem: Fluid Volume: Goal: Ability to maintain a balanced intake and output will improve 05/22/2024 2013 by Alric Jensen, Addison Ade, RN Outcome: Progressing 05/22/2024 2011 by Alric Jensen, Addison Ade, RN Outcome: Progressing   Problem: Nutritional: Goal: Maintenance of adequate nutrition will improve 05/22/2024 2013 by Alric Jensen, Maresa Morash G, RN Outcome: Progressing 05/22/2024 2011 by Alric Jensen, Addison Ade, RN Outcome: Progressing Goal: Progress toward achieving an optimal weight will improve 05/22/2024 2013 by Alric Jensen, Rehan Holness G, RN Outcome: Progressing 05/22/2024 2011 by Alric Jensen, Addison Ade, RN Outcome: Progressing   Problem: Skin Integrity: Goal: Risk for impaired skin integrity will decrease 05/22/2024 2013 by Alric Jensen, Addison Ade, RN Outcome: Progressing 05/22/2024 2011 by Alric Jensen, Addison Ade, RN Outcome: Progressing   Problem: Education: Goal: Ability to demonstrate management of disease process will improve Outcome: Progressing Goal: Ability to verbalize understanding of medication therapies will improve Outcome: Progressing Goal: Individualized Educational Video(s) Outcome: Progressing   Problem: Activity: Goal: Capacity to carry out activities will improve Outcome: Progressing   Problem: Cardiac: Goal: Ability to achieve and maintain adequate cardiopulmonary perfusion will improve Outcome: Progressing

## 2024-05-22 NOTE — NC FL2 (Signed)
 [place]  MEDICAID FL2 LEVEL OF CARE FORM     IDENTIFICATION  Patient Name: Paul [name] Birthdate: 1946-05-28 Sex: male Admission Date (Current Location): 05/17/2024  [facility] and [place] Number:  Producer, television/film/video and Address:  [facility] [place], [address], [place], [place] [address]      Provider Number: [id number]  Attending Physician Name and Address:  Magdalene School, MD  Relative Name and Phone Number:  [phone]    Current Level of Care: Hospital Recommended Level of Care: Skilled Nursing Facility Prior Approval Number:    Date Approved/Denied:   PASRR Number: [id number]  Discharge Plan: SNF    Current Diagnoses: Patient Active Problem List   Diagnosis Date Noted   Shock (HCC) 05/18/2024   Sepsis (HCC) 05/18/2024   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 (HCC) 10/07/2023   Type 2 diabetes mellitus with chronic kidney disease, with long-term current use of insulin  (HCC) 03/18/2023   Community acquired bacterial pneumonia 03/16/2023   Shortness of breath 03/12/2023   AKI (acute kidney injury) (HCC) 07/10/2022   Hyperkalemia 07/10/2022   Hypokalemia 07/10/2022   DKA (diabetic ketoacidosis) (HCC) 07/09/2022   Cough 06/04/2022   Goals of care, counseling/discussion 10/23/2021   Hyperosmolar hyperglycemic state (HHS) (HCC) 06/23/2021   Hyperglycemia due to diabetes mellitus (HCC) 06/22/2021   GERD (gastroesophageal reflux disease) 06/22/2021   Essential hypertension 06/22/2021   Hyperlipidemia 06/22/2021   Adenocarcinoma of right lung, stage 3 (HCC) 06/17/2021   Encounter for antineoplastic chemotherapy 06/17/2021   Encounter for antineoplastic immunotherapy 06/17/2021   Lung nodule 05/17/2021   Stage 3a chronic kidney disease (CKD) (HCC) 09/03/2018   CAD, multiple vessel 09/03/2018    Orientation RESPIRATION BLADDER Height & Weight     Self, Time, Place  O2 (1L) Continent, External  catheter Weight: 240 lb 15.4 oz (109.3 kg) Height:  5\' 8"  (172.7 cm)  BEHAVIORAL SYMPTOMS/MOOD NEUROLOGICAL BOWEL NUTRITION STATUS      Continent Diet (See DC summary)  AMBULATORY STATUS COMMUNICATION OF NEEDS Skin   Limited Assist Verbally Normal                       Personal Care Assistance Level of Assistance  Bathing, Feeding, Dressing Bathing Assistance: Limited assistance Feeding assistance: Independent Dressing Assistance: Limited assistance     Functional Limitations Info  Sight, Hearing, Speech Sight Info: Adequate Hearing Info: Adequate Speech Info: Adequate    SPECIAL CARE FACTORS FREQUENCY  PT (By licensed PT), OT (By licensed OT)     PT Frequency: 5x per week OT Frequency: 5x oer week            Contractures Contractures Info: Not present    Additional Factors Info  Code Status, Allergies, Insulin  Sliding Scale Code Status Info: Full Allergies Info: NKA   Insulin  Sliding Scale Info: See DC summary       Current Medications (05/22/2024):  This is the current hospital active medication list Current Facility-Administered Medications  Medication Dose Route Frequency Provider Last Rate Last Admin   albuterol  (PROVENTIL ) (2.5 MG/3ML) 0.083% nebulizer solution 2.5 mg  2.5 mg Nebulization Q4H PRN Smith, Daniel C, MD       bisacodyl (DULCOLAX) suppository 10 mg  10 mg Rectal Daily PRN Bowser, Grace E, NP   10 mg at 05/20/24 1740   cefTRIAXone  (ROCEPHIN ) 2 g in sodium chloride  0.9 % 100 mL IVPB  2 g Intravenous Q24H Jaquita Merl P, DO 200 mL/hr at  05/22/24 1014 2 g at 05/22/24 1014   Chlorhexidine  Gluconate Cloth 2 % PADS 6 each  6 each Topical Daily Joesph Mussel, DO   6 each at 05/21/24 1000   docusate sodium  (COLACE) capsule 100 mg  100 mg Oral BID PRN Lemmie Pyo, NP       feeding supplement (PROSource TF20) liquid 60 mL  60 mL Per Tube Daily Jaquita Merl P, DO   60 mL at 05/20/24 0936   feeding supplement (VITAL 1.5 CAL) liquid 1,000 mL  1,000 mL  Per Tube Continuous Joesph Mussel, DO 50 mL/hr at 05/20/24 1600 Infusion Verify at 05/20/24 1600   heparin  injection 5,000 Units  5,000 Units Subcutaneous Q8H Lemmie Pyo, NP   5,000 Units at 05/22/24 1458   insulin  aspart (novoLOG ) injection 0-15 Units  0-15 Units Subcutaneous TID WC Josiah Nigh, MD   5 Units at 05/22/24 1204   levothyroxine  (SYNTHROID ) tablet 125 mcg  125 mcg Oral Q0600 Josiah Nigh, MD   125 mcg at 05/22/24 0557   norepinephrine (LEVOPHED) 16 mg in 250mL (0.064 mg/mL) premix infusion  0-40 mcg/min Intravenous Titrated Lemmie Pyo, NP   Stopped at 05/20/24 1332   ondansetron  (ZOFRAN ) injection 4 mg  4 mg Intravenous Q6H PRN Claven Cumming, MD       Oral care mouth rinse  15 mL Mouth Rinse PRN Josiah Nigh, MD       polyethylene glycol (MIRALAX  / GLYCOLAX ) packet 17 g  17 g Oral Daily PRN Lemmie Pyo, NP       revefenacin (YUPELRI) nebulizer solution 175 mcg  175 mcg Nebulization Daily Delories Fetter, NP   175 mcg at 05/22/24 0833     Discharge Medications: Please see discharge summary for a list of discharge medications.  Relevant Imaging Results:  Relevant Lab Results:   Additional Information SSN# [id number]  Paul Merritts, LCSW

## 2024-05-22 NOTE — Progress Notes (Signed)
 CSW acknowledges recommendation for SNF placement.CSW called pt's daughter Paul [name] to discuss discharge planning needs. CSW had to leave a voice mail.  TOC team will continue to assist with discharge planning needs.

## 2024-05-22 NOTE — Evaluation (Signed)
 Occupational Therapy Evaluation Patient Details Name: Paul [name] MRN: [id number] DOB: October 03, 1946 Today's Date: 05/22/2024   History of Present Illness   78 y/o male with who presented to [facility] on 5/20 with SOB who then became unresponsive. Intubated on 5/20, extubated 5/23. Pt found to have acute hypoxic and hypercapnic respiratory failure, acute encephalopathy, and suspected community-acquired PNA. Cxr showing mild pulmonary edema/congestion. Pt now s/p thoracentesis on 5/24. 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 Pt reports washing up at the sink due to difficulty stepping in and out of the tub. Pt's family reports pt has been having increased difficulty with ADLs and IADLs, including difficulty managing medication, missing MD appointments, and increased difficulty with ADLs and meal prep. Pt now presents with decreased cognition, decreased activity tolerance, decreased balance, generalized B UE weakness, decreased B UE fine motor coordination, mildly edematous in B UE, increased fatigue, and decreased safety and independence with functional tasks. Pt currently demonstrates ability to complete UB ADLs with Set up to Contact guard assist, LB ADLs with Mod to Max assist, and functional transfers/mobility with a RW with Contact guard to Min assist. Pt currently requires cues for safety, sequencing, and compensatory strategies. Pt participated well in session and has good family support. Pt VSS on 1L continuous O2 through nasal cannula throughout session. Pt will benefit from acute skilled OT services to address deficits outlined below, decrease risk of falls, decrease risk of hospital readmission, and increase safety and independence with functional tasks. Post acute  discharge, pt will benefit from intensive inpatient skilled rehab services < 3 hours per day to maximize rehab potential.      If plan is discharge home, recommend the following:   A little help with walking and/or transfers;A lot of help with bathing/dressing/bathroom;Assistance with cooking/housework;Direct supervision/assist for financial management;Direct supervision/assist for medications management;Assist for transportation;Help with stairs or ramp for entrance;Supervision due to cognitive status     Functional Status Assessment   Patient has had a recent decline in their functional status and demonstrates the ability to make significant improvements in function in a reasonable and predictable amount of time.     Equipment Recommendations   Tub/shower bench;BSC/3in1;Other (comment) (RW)     Recommendations for Other Services         Precautions/Restrictions   Precautions Precautions: Fall Restrictions Weight Bearing Restrictions Per Provider Order: No     Mobility Bed Mobility Overal bed mobility: Needs Assistance             General bed mobility comments: Pt sitting in recliner at beginning and end of session.    Transfers Overall transfer level: Needs assistance Equipment used: Rolling walker (2 wheels) Transfers: Sit to/from Stand, Bed to chair/wheelchair/BSC Sit to Stand: Min assist     Step pivot transfers: Min assist     General transfer comment: verbal cues for hand placement/technique and safety      Balance Overall balance assessment: Needs assistance Sitting-balance support: No upper extremity supported, Feet supported Sitting balance-Leahy Scale: Fair     Standing balance support: Bilateral upper extremity supported, During functional activity, Reliant on assistive device for balance Standing balance-Leahy Scale: Poor Standing balance comment: reliant on external support  ADL either performed  or assessed with clinical judgement   ADL Overall ADL's : Needs assistance/impaired Eating/Feeding: Set up;Sitting   Grooming: Set up;Supervision/safety;Sitting   Upper Body Bathing: Contact guard assist;Sitting   Lower Body Bathing: Moderate assistance;Maximal assistance;Cueing for safety;Sitting/lateral leans;Sit to/from stand   Upper Body Dressing : Contact guard assist;Sitting;Cueing for sequencing   Lower Body Dressing: Moderate assistance;Maximal assistance;Cueing for safety;Cueing for sequencing;Cueing for compensatory techniques;Sitting/lateral leans;Sit to/from stand   Toilet Transfer: Minimal assistance;Rolling walker (2 wheels);BSC/3in1 (step-pivot transfer) Toilet Transfer Details (indicate cue type and reason): simulated at recliner Toileting- Clothing Manipulation and Hygiene: Maximal assistance;Sit to/from stand       Functional mobility during ADLs: Minimal assistance;Rolling walker (2 wheels);Cueing for sequencing;Cueing for safety General ADL Comments: Pt with decreased activity tolerance, fatiguing quickly during tasks     Vision Baseline Vision/History: 0 No visual deficits Ability to See in Adequate Light: 0 Adequate Patient Visual Report: No change from baseline       Perception         Praxis         Pertinent Vitals/Pain Pain Assessment Pain Assessment: No/denies pain     Extremity/Trunk Assessment Upper Extremity Assessment Upper Extremity Assessment: Left hand dominant;Generalized weakness;RUE deficits/detail;LUE deficits/detail RUE Deficits / Details: generalized weakness; decreased fine motor coordination (worse on Left); edematous throughout (worse on Right) RUE Coordination: decreased fine motor LUE Deficits / Details: generalized weakness; decreased fine motor coordination (worse on Left); edematous throughout (worse on Right) LUE Coordination: decreased fine motor   Lower Extremity Assessment Lower Extremity Assessment: Defer to PT  evaluation   Cervical / Trunk Assessment Cervical / Trunk Assessment: Normal   Communication Communication Communication: Impaired Factors Affecting Communication: Reduced clarity of speech (soft spoken; requires increased time for processing)   Cognition Arousal: Alert Behavior During Therapy: Flat affect Cognition: Cognition impaired   Orientation impairments: Time (Pt initially stating it is currently 06-07-1946. When asked again, pt stated it was June 2025. When asked his birthday, pt initially stated 1946/02/29 then self-corrected to April 09, 1946.) Awareness: Intellectual awareness intact, Online awareness impaired Memory impairment (select all impairments): Short-term memory, Working memory, Engineer, structural memory Attention impairment (select first level of impairment): Alternating attention Executive functioning impairment (select all impairments): Organization, Reasoning, Problem solving OT - Cognition Comments: Pt pleasant and participating well throughout session. Due to pt history of missed medication and appointments, OT plans to further assess pt cognition in functional contexts in furture sessions.                 Following commands: Impaired Following commands impaired: Follows one step commands with increased time, Only follows one step commands consistently     Cueing  General Comments   Cueing Techniques: Verbal cues;Tactile cues;Visual cues  VSS on 1L continuous O2 through nasal cannula throughout session with O2 sat >/94% throughout session. Pt's sister present throughout session. RN present during a portion of session. OT also spoke briefly with pt's daughter who arrived soon after session finished.    Exercises     Shoulder Instructions      Home Living Family/patient expects to be discharged to:: Private residence Living Arrangements: Non-relatives/Friends Available Help at Discharge: Family;Friend(s);Available PRN/intermittently Type of Home:  Apartment Home Access: Stairs to enter Entrance Stairs-Number of Steps: 2 (2 per pt report; pt's sister reports 4 steps) Entrance Stairs-Rails: None Home Layout: One level     Bathroom Shower/Tub: Tub/shower unit;Sponge bathes at baseline   [employer]: Standard Bathroom Accessibility: Yes How Accessible:  Accessible via walker Home Equipment: Cane - single point   Additional Comments: Pt giving conflicting answers regarding home setup and living situation with pt reporting he lives with a friend/roommate, with his daughter, and alone at different points of the session. Information above confirmed with pt's sister. Pt's sister reports pt's roommate is often home, but does not assist pt with ADLs or IADLs. Sister further reports that pt's daughter who lives in [place] would like for pt to move closer to her. Pt's daughter reports pt's rommate is in the process of moving out.       Prior Functioning/Environment Prior Level of Function : Independent/Modified Independent;Driving             Mobility Comments: Pt uses a SPC for mobility; was driving ADLs Comments: Pt reports he is Independent with ADLs and IADLs and typically washes up at the sink due to difficulty stpping in/out of tub. Pt's sister reports she does not believe pt has been eating well. Per chart review and pt and sister's report, pt has missed several doses of medication and MD appointments. Pt states this session that he has trouble swallowing medications with RN notified of pt report.    OT Problem List: Decreased strength;Decreased activity tolerance;Impaired balance (sitting and/or standing);Decreased coordination;Decreased cognition;Decreased safety awareness;Decreased knowledge of use of DME or AE;Cardiopulmonary status limiting activity   OT Treatment/Interventions: Self-care/ADL training;Therapeutic exercise;Energy conservation;DME and/or AE instruction;Therapeutic activities;Cognitive  remediation/compensation;Patient/family education;Balance training      OT Goals(Current goals can be found in the care plan section)   Acute Rehab OT Goals Patient Stated Goal: to feel better and return home OT Goal Formulation: With patient/family Time For Goal Achievement: 06/05/24 Potential to Achieve Goals: Good ADL Goals Pt Will Perform Grooming: with supervision;standing Pt Will Perform Upper Body Bathing: with modified independence;sitting Pt Will Perform Lower Body Dressing: with contact guard assist;sitting/lateral leans;sit to/from stand Pt Will Transfer to Toilet: with supervision;ambulating;bedside commode (with least restrictive AD) Pt Will Perform Toileting - Clothing Manipulation and hygiene: with contact guard assist;sitting/lateral leans;sit to/from stand Pt Will Perform Tub/Shower Transfer: with contact guard assist;tub bench;ambulating (with least restrictive AD) Additional ADL Goal #1: Patient will demonstrate ability to accurately set up a weekly medication planner with Supervision.   OT Frequency:  Min 2X/week    Co-evaluation              AM-PAC OT "6 Clicks" Daily Activity     Outcome Measure Help from another person eating meals?: A Little Help from another person taking care of personal grooming?: A Little Help from another person toileting, which includes using toliet, bedpan, or urinal?: A Lot Help from another person bathing (including washing, rinsing, drying)?: A Lot Help from another person to put on and taking off regular upper body clothing?: A Little Help from another person to put on and taking off regular lower body clothing?: A Lot 6 Click Score: 15   End of Session Equipment Utilized During Treatment: Rolling walker (2 wheels);Gait belt;Oxygen Nurse Communication: Mobility status;Other (comment) (Cognitive status. Pt reporting difficulty swallowing medications at home.)  Activity Tolerance: Patient tolerated treatment well;Patient  limited by fatigue Patient left: in chair;with call bell/phone within reach;with family/visitor present  OT Visit Diagnosis: Unsteadiness on feet (R26.81);Other abnormalities of gait and mobility (R26.89);Muscle weakness (generalized) (M62.81);Ataxia, unspecified (R27.0);Other (comment) (decreased activity tolerance)                Time: 1340-1409 OT Time Calculation (min): 29 min Charges:  OT General Charges $  OT Visit: 1 Visit OT Evaluation $OT Eval Low Complexity: 1 Low OT Treatments $Self Care/Home Management : 8-22 mins  Demarie Uhlig "Darral Ellis., OTR/L, MA Acute Rehab [phone]  Walt Gunner 05/22/2024, 2:53 PM

## 2024-05-23 LAB — RENAL FUNCTION PANEL
Albumin: 3 g/dL — ABNORMAL LOW (ref 3.5–5.0)
Anion gap: 5 (ref 5–15)
BUN: 37 mg/dL — ABNORMAL HIGH (ref 8–23)
CO2: 27 mmol/L (ref 22–32)
Calcium: 8.2 mg/dL — ABNORMAL LOW (ref 8.9–10.3)
Chloride: 110 mmol/L (ref 98–111)
Creatinine, Ser: 1.86 mg/dL — ABNORMAL HIGH (ref 0.61–1.24)
GFR, Estimated: 36 mL/min — ABNORMAL LOW (ref >60)
Glucose, Bld: 151 mg/dL — ABNORMAL HIGH (ref 70–99)
Phosphorus: 2.3 mg/dL — ABNORMAL LOW (ref 2.5–4.6)
Potassium: 3.6 mmol/L (ref 3.5–5.1)
Sodium: 142 mmol/L (ref 135–145)

## 2024-05-23 LAB — CBC
HCT: 35.1 % — ABNORMAL LOW (ref 39.0–52.0)
Hemoglobin: 11.1 g/dL — ABNORMAL LOW (ref 13.0–17.0)
MCH: 31.4 pg (ref 26.0–34.0)
MCHC: 31.6 g/dL (ref 30.0–36.0)
MCV: 99.2 fL (ref 80.0–100.0)
Platelets: 150 10*3/uL (ref 150–400)
RBC: 3.54 MIL/uL — ABNORMAL LOW (ref 4.22–5.81)
RDW: 17.4 % — ABNORMAL HIGH (ref 11.5–15.5)
WBC: 9.2 10*3/uL (ref 4.0–10.5)
nRBC: 0 % (ref 0.0–0.2)

## 2024-05-23 LAB — GLUCOSE, CAPILLARY
Glucose-Capillary: 134 mg/dL — ABNORMAL HIGH (ref 70–99)
Glucose-Capillary: 192 mg/dL — ABNORMAL HIGH (ref 70–99)
Glucose-Capillary: 209 mg/dL — ABNORMAL HIGH (ref 70–99)
Glucose-Capillary: 193 mg/dL — ABNORMAL HIGH (ref 70–99)

## 2024-05-23 LAB — CULTURE, BLOOD (ROUTINE X 2): Culture: NO GROWTH

## 2024-05-23 NOTE — Progress Notes (Signed)
 Speech Language Pathology Treatment: Dysphagia  Patient Details Name: Paul [name] MRN: [id number] DOB: 1946/01/30 Today's Date: 05/23/2024 Time: 1201-1212 SLP Time Calculation (min) (ACUTE ONLY): 11 min  Assessment / Plan / Recommendation Clinical Impression  F/u after this weekend's FEES. Daughter Paul [name] at bedside.  We reviewed results, concerns for LPR and GER, discussed the impact of condition on tissue in larynx (inflammation, irritation).  Pt may benefit from ENT and/or GI consult as OP. Discussed basic esophageal precautions.  No issues with PO consumption during our session per observation.  Plan is for pt to move to [name] to be closer to [name] after D/C from rehab.  She will work to [facility] and[facility] once [facility].   No further SLP f/u is needed. Our service will sign off.    HPI HPI: Paul [name] is a 78 y/o gentleman with a history of lung cancer with likely recurrence, DM, HFrEF who presented with confusion and respiratory failure found to have septic shock and pneumonia. He required intubation for lethargy 5/21-5/23. FEES 5/24 showed no aspiration but evidence of potential LPR with laryngeal inflammation.      SLP Plan  All goals met      Recommendations for follow up therapy are one component of a multi-disciplinary discharge planning process, led by the attending physician.  Recommendations may be updated based on patient status, additional functional criteria and insurance authorization.    Recommendations  Diet recommendations: Regular;Thin liquid Liquids provided via: Cup;Straw Medication Administration: Whole meds with liquid Supervision: Patient able to self feed Compensations: Slow rate;Small sips/bites                  Oral care BID     Dysphagia, unspecified (R13.10)     All goals met    Paul [name] L. Paul Lincoln, MA CCC/SLP Clinical Specialist - Acute Care SLP Acute Rehabilitation Services Office number [phone]  Paul [name] Paul [name]  05/23/2024, 12:12 PM

## 2024-05-23 NOTE — Progress Notes (Signed)
 Mobility Specialist Progress Note;   05/23/24 1350  Mobility  Activity Ambulated with assistance in room;Transferred from bed to chair  Level of Assistance Moderate assist, patient does 50-74%  Assistive Device Front wheel walker  Distance Ambulated (ft) 15 ft  Activity Response Tolerated well  Mobility Referral Yes  Mobility visit 1 Mobility  Mobility Specialist Start Time (ACUTE ONLY) 1350  Mobility Specialist Stop Time (ACUTE ONLY) 1411  Mobility Specialist Time Calculation (min) (ACUTE ONLY) 21 min   Pt agreeable to mobility w/ encouragement. On 1LO2 upon arrival. Required ModA for bed mobility and to stand from bedside, MinA to ambulate safely in room. VSS throughout on 1LO2. Transferred pt to chair at [facility]. Pt left in chair with all needs met, alarm on. Daughter in room.   Janit Meline Mobility Specialist Please contact via Secure[employer] or [employer] [phone]

## 2024-05-23 NOTE — Progress Notes (Signed)
 PROGRESS NOTE    Paul [name]  WUJ:[id number] DOB: October 07, 1946 DOA: 05/17/2024 PCP: [facility]   Brief Narrative:  This 78 years old male with PMH significant for type 2 diabetes on insulin , HFrEF 25%, lung cancer, active smoker, hypertension, CKD stage IIIa, hyperlipidemia, hypothyroidism, CAD status post CABG, COPD not on home oxygen, OSA presented in the ED with c/o: Shortness of breath since yesterday.  He was hypoxic with SpO2 of 87% on room air and was placed on CPAP.  Patient was given Solu-Medrol ,  magnesium sulfate and nebulization.  Patient continued to have apneic spells, He was placed on BiPAP. He was also started on Levophed support for hypotension. Patient's mental status has worsened and was not responsive,  Subsequently patient was admitted in the ICU. TRH pickup 05/22/2024.  Assessment & Plan:   Principal Problem:   Shock (HCC) Active Problems:   Sepsis (HCC)  Acute hypoxic and hypercapnic respiratory failure: Acute encephalopathy likely multifactorial. Community-acquired pneumonia Patient presented with worsening shortness of breath.   He was hypoxic with SpO2 of 87% on room air,  requiring BiPAP. Successfully weaned off of Levophed. Wean off stress dose steroids. Continue ceftriaxone  for 7 days for possible Community-acquired pneumonia. Continue nebs as needed. Patient underwent successful thoracocentesis for persistent pleural effusion. He is now on 2 L of supplemental oxygen sats 90%. PT and OT evaluation.  Acute on chronic HFr EF: Patient is now euvolemic. Continue home medications.  Type 2 diabetes: Blood sugar exaggerated by steroids.   Now hypoglycemic.  Encourage oral intake.  History of lung cancer: Currently not on chemotherapy.  Next follow-up outpatient.  Hypothyroidism: Continue Synthroid .  TSH still elevated but better than before.   DVT prophylaxis: Heparin  sq Code Status: Full code Family Communication: No family at bed  side.  Disposition Plan: Status is: Inpatient Remains inpatient appropriate because: Severity of illness.   Consultants:  Pulmonology  Procedures: Thoracocentesis  Antimicrobials:  Anti-infectives (From admission, onward)    Start     Dose/Rate Route Frequency Ordered Stop   05/18/24 1000  cefTRIAXone  (ROCEPHIN ) 1 g in sodium chloride  0.9 % 100 mL IVPB  Status:  Discontinued        1 g 200 mL/hr over 30 Minutes Intravenous Every 24 hours 05/18/24 0058 05/18/24 0914   05/18/24 1000  cefTRIAXone  (ROCEPHIN ) 2 g in sodium chloride  0.9 % 100 mL IVPB        2 g 200 mL/hr over 30 Minutes Intravenous Every 24 hours 05/18/24 0914     05/18/24 0400  azithromycin  (ZITHROMAX ) 500 mg in sodium chloride  0.9 % 250 mL IVPB  Status:  Discontinued        500 mg 250 mL/hr over 60 Minutes Intravenous Every 24 hours 05/18/24 0058 05/21/24 1342   05/17/24 2315  vancomycin (VANCOREADY) IVPB 2000 mg/400 mL        2,000 mg 200 mL/hr over 120 Minutes Intravenous  Once 05/17/24 2301 05/18/24 0425   05/17/24 2300  ceFEPIme (MAXIPIME) 2 g in sodium chloride  0.9 % 100 mL IVPB        2 g 200 mL/hr over 30 Minutes Intravenous  Once 05/17/24 2248 05/17/24 2349   05/17/24 2300  metroNIDAZOLE (FLAGYL) IVPB 500 mg        500 mg 100 mL/hr over 60 Minutes Intravenous  Once 05/17/24 2248 05/18/24 0249   05/17/24 2300  vancomycin (VANCOCIN) IVPB 1000 mg/200 mL premix  Status:  Discontinued        1,000 mg  200 mL/hr over 60 Minutes Intravenous  Once 05/17/24 2248 05/17/24 2301      Subjective: Patient was seen and examined at bedside. Overnight events noted. Patient was lying comfortably in the bed,  states he is feeling better. He underwent thoracocentesis.  Feels much improved afterwards.  Objective: Vitals:   05/23/24 0500 05/23/24 0717 05/23/24 0721 05/23/24 1122  BP: (!) 150/97  (!) 150/90 132/79  Pulse: 61 61 61 69  Resp: 18 16 18 18   Temp: 97.8 F (36.6 C)  98 F (36.7 C) 97.8 F (36.6 C)   TempSrc: Axillary  Oral Oral  SpO2: 99% 98% 100% 98%  Weight: 110.6 kg     Height:        Intake/Output Summary (Last 24 hours) at 05/23/2024 1204 Last data filed at 05/23/2024 1024 Gross per 24 hour  Intake 940 ml  Output 775 ml  Net 165 ml   Filed Weights   05/21/24 1928 05/22/24 0315 05/23/24 0500  Weight: 109 kg 109.3 kg 110.6 kg    Examination:  General exam: Appears calm and comfortable , not in any acute distress. Respiratory system: CTA Bilaterally . Respiratory effort normal.  RR 16 Cardiovascular system: S1 & S2 heard, RRR. No JVD, murmurs, rubs, gallops or clicks.  Gastrointestinal system: Abdomen is non distended, soft and non tender.  Normal bowel sounds heard. Central nervous system: Alert and oriented x 3. No focal neurological deficits. Extremities: No edema, no cyanosis, no clubbing Skin: No rashes, lesions or ulcers Psychiatry: Judgement and insight appear normal. Mood & affect appropriate.     Data Reviewed: I have personally reviewed following labs and imaging studies  CBC: Recent Labs  Lab 05/17/24 2146 05/17/24 2209 05/18/24 0056 05/18/24 0253 05/18/24 0500 05/21/24 0444 05/22/24 0229 05/23/24 0434  WBC 4.6   < > 4.6  --  4.8 12.8* 11.2* 9.2  NEUTROABS 2.9  --   --   --   --   --   --   --   HGB 12.0*   < > 10.2* 9.9* 10.2* 11.2* 11.0* 11.1*  HCT 37.7*   < > 32.4* 29.0* 32.5* 35.6* 34.8* 35.1*  MCV 99.5   < > 98.8  --  99.1 98.9 97.2 99.2  PLT 163   < > 171  --  180 152 146* 150   < > = values in this interval not displayed.   Basic Metabolic Panel: Recent Labs  Lab 05/18/24 0022 05/18/24 0056 05/18/24 0500 05/18/24 1649 05/19/24 0515 05/19/24 1834 05/20/24 0408 05/21/24 0444 05/22/24 0229 05/22/24 0231 05/23/24 0443  NA 147*   < > 144  --  143  --  143 148* 145 144 142  K 4.3   < > 4.6  --  4.8  --  3.6 3.7 4.1 4.1 3.6  CL 114*  --  112*  --  109  --  106 109 110 110 110  CO2 26  --  25  --  21*  --  25 29 26 26 27   GLUCOSE  96  --  127*  --  214*  --  262* 73 171* 168* 151*  BUN 28*  --  29*  --  44*  --  55* 51* 47* 47* 37*  CREATININE 1.91*   < > 2.08*  --  2.63*  --  2.67* 2.24* 2.10* 2.07* 1.86*  CALCIUM  9.1  --  8.5*  --  8.5*  --  8.4* 8.8* 8.5* 8.4* 8.2*  MG 2.5*  --  2.5* 2.2 2.2 2.2  --   --   --   --   --   PHOS 3.6  --  3.3 4.2 4.8* 4.3  --   --   --  3.3 2.3*   < > = values in this interval not displayed.   GFR: Estimated Creatinine Clearance: 38.9 mL/min (A) (by C-G formula based on SCr of 1.86 mg/dL (H)). Liver Function Tests: Recent Labs  Lab 05/17/24 2309 05/22/24 0231 05/23/24 0443  AST 47*  --   --   ALT 27  --   --   ALKPHOS 71  --   --   BILITOT 0.8  --   --   PROT 6.5  --   --   ALBUMIN 3.2* 3.0* 3.0*   No results for input(s): "LIPASE", "AMYLASE" in the last 168 hours. No results for input(s): "AMMONIA" in the last 168 hours. Coagulation Profile: Recent Labs  Lab 05/17/24 2309  INR 1.3*   Cardiac Enzymes: No results for input(s): "CKTOTAL", "CKMB", "CKMBINDEX", "TROPONINI" in the last 168 hours. BNP (last 3 results) No results for input(s): "PROBNP" in the last 8760 hours. HbA1C: No results for input(s): "HGBA1C" in the last 72 hours. CBG: Recent Labs  Lab 05/22/24 1123 05/22/24 1617 05/22/24 2051 05/23/24 0718 05/23/24 1133  GLUCAP 222* 225* 315* 134* 192*   Lipid Profile: No results for input(s): "CHOL", "HDL", "LDLCALC", "TRIG", "CHOLHDL", "LDLDIRECT" in the last 72 hours. Thyroid  Function Tests: Recent Labs    05/22/24 0229  TSH 27.587*   Anemia Panel: No results for input(s): "VITAMINB12", "FOLATE", "FERRITIN", "TIBC", "IRON", "RETICCTPCT" in the last 72 hours. Sepsis Labs: Recent Labs  Lab 05/17/24 2315 05/18/24 0028 05/18/24 1838 05/18/24 2126 05/19/24 0515  PROCALCITON  --   --   --   --  0.16  LATICACIDVEN 1.6 1.8 1.5 1.6  --     Recent Results (from the past 240 hours)  Blood Culture (routine x 2)     Status: None   Collection Time:  05/17/24 10:59 PM   Specimen: BLOOD LEFT ARM  Result Value Ref Range Status   Specimen Description BLOOD LEFT ARM  Final   Special Requests   Final    BOTTLES DRAWN AEROBIC AND ANAEROBIC Blood Culture results may not be optimal due to an inadequate volume of blood received in culture bottles   Culture   Final    NO GROWTH 5 DAYS Performed at [facility], [address] [place], [place] [address]    Report Status 05/22/2024 FINAL  Final  Blood Culture (routine x 2)     Status: None   Collection Time: 05/18/24 12:19 AM   Specimen: BLOOD LEFT HAND  Result Value Ref Range Status   Specimen Description BLOOD LEFT HAND  Final   Special Requests   Final    BOTTLES DRAWN AEROBIC AND ANAEROBIC Blood Culture results may not be optimal due to an inadequate volume of blood received in culture bottles   Culture   Final    NO GROWTH 5 DAYS Performed at [facility], [address], [place], [place] [address]    Report Status 05/23/2024 FINAL  Final  MRSA Next Gen by PCR, Nasal     Status: None   Collection Time: 05/18/24  1:32 AM   Specimen: Nasal Mucosa; Nasal Swab  Result Value Ref Range Status   MRSA by PCR Next Gen NOT DETECTED NOT DETECTED Final  Comment: (NOTE) The GeneXpert MRSA Assay (FDA approved for NASAL specimens only), is one component of a comprehensive MRSA colonization surveillance program. It is not intended to diagnose MRSA infection nor to guide or monitor treatment for MRSA infections. Test performance is not FDA approved in patients less than 35 years old. Performed at [facility], [address], [place], [place] [address]     Radiology Studies: DG Chest Port 1 View Result Date: 05/21/2024 CLINICAL DATA:  Status post thoracentesis. History of non-small-cell lung cancer EXAM: PORTABLE CHEST 1 VIEW COMPARISON:  Chest x-ray 05/18/2024 and older FINDINGS: Enteric tube no longer seen. Stable right IJ catheter. Enlarged cardiopericardial silhouette  with some vascular congestion. Sternal wires. Small right pleural effusion and tiny left. Adjacent lung opacities. No pneumothorax seen. Overlapping cardiac leads. IMPRESSION: No pneumothorax seen post thoracentesis. Small right and tiny left pleural effusion with adjacent opacities. Postop chest with vascular congestion.  Enlarged heart. Right IJ catheter. Electronically Signed   By: Adrianna Horde M.D.   On: 05/21/2024 15:08   Scheduled Meds:  heparin   5,000 Units Subcutaneous Q8H   insulin  aspart  0-15 Units Subcutaneous TID PC & HS   levothyroxine   125 mcg Oral Q0600   revefenacin  175 mcg Nebulization Daily   Continuous Infusions:  cefTRIAXone  (ROCEPHIN )  IV 2 g (05/23/24 1023)   norepinephrine (LEVOPHED) Adult infusion Stopped (05/20/24 1332)     LOS: 5 days    Time spent: 35 Mins    Magdalene School, MD [facility]   If 7PM-7AM, please contact night-coverage

## 2024-05-23 NOTE — Plan of Care (Signed)
  Problem: Education: Goal: Ability to describe self-care measures that may prevent or decrease complications (Diabetes Survival Skills Education) will improve Outcome: Progressing Goal: Individualized Educational Video(s) Outcome: Progressing   Problem: Fluid Volume: Goal: Ability to maintain a balanced intake and output will improve Outcome: Progressing   Problem: Nutritional: Goal: Maintenance of adequate nutrition will improve Outcome: Progressing Goal: Progress toward achieving an optimal weight will improve Outcome: Progressing   Problem: Skin Integrity: Goal: Risk for impaired skin integrity will decrease Outcome: Progressing   Problem: Education: Goal: Ability to demonstrate management of disease process will improve Outcome: Progressing Goal: Ability to verbalize understanding of medication therapies will improve Outcome: Progressing Goal: Individualized Educational Video(s) Outcome: Progressing   Problem: Activity: Goal: Capacity to carry out activities will improve Outcome: Progressing   Problem: Cardiac: Goal: Ability to achieve and maintain adequate cardiopulmonary perfusion will improve Outcome: Progressing

## 2024-05-23 NOTE — Progress Notes (Signed)
   05/23/24 1100  Spiritual Encounters  Type of Visit Initial  Conversation partners present during encounter Nurse  Referral source Nurse (RN/NT/LPN)  OnCall Visit Yes    Chaplain received page for Power of [place]. Chaplain talked with RN via phone and explained that the hospital does not notarize the POA document. She conveyed what we discussed to the pt's relative.   Chaplain Resident M.Kubra Saifan Rayford [phone]

## 2024-05-23 NOTE — TOC Progression Note (Signed)
 Transition of Care [facility]) - Progression Note    Patient Details  Name: Paul [name] MRN: [id number] Date of Birth: [birth year]  Transition of Care [facility]) CM/SW Contact  Ernst Heap Phone Number: [phone] 05/23/2024, 11:02 AM  Clinical Narrative:   10:55 AM- CSW called and spoke with the patients daughter to discuss current bed offers list. Patients daughter stated that she is in the room and will be most of the day. CSW will meet with patients daughter and leave [email or web address] list at bedside with family to review. Unit CSW will follow up on selected bed choice.                  Skilled Nursing Rehab Facilities-   [email or web address]  ?  ?  Ratings out of 5 stars (5 the highest)  ?   Name  Address   Phone #  Quality Care  Staffing  Health Inspection  Overall   [facility]  [address] [facility]  [phone]  4  2  2  2    [facility]  [address] [place]  [phone]  5  1  2  2    [address] ([facility])  [address], [place]  [phone]  2  3  3  3    [facility]  [address] [place], [place]  [phone]  3  3  2  2      Patients daughter also inquired about HCPOA paperwork. CSW notified the the attending via secure chat to place consult.   TOC will continue following.     Expected Discharge Plan: Skilled Nursing Facility Barriers to Discharge: Continued Medical Work up  Expected Discharge Plan and Services                                               Social Determinants of Health (SDOH) Interventions SDOH Screenings   Food Insecurity: Patient Unable To Answer (05/18/2024)  Housing: Patient Unable To Answer (05/18/2024)  Transportation Needs: Patient Unable To Answer (05/18/2024)  Utilities: Patient Unable To Answer (05/18/2024)  Financial Resource Strain: Low Risk  (02/03/2023)   Received from [facility], [facility]  Social Connections: Patient Unable To Answer (05/18/2024)   Stress: No Stress Concern Present (02/03/2023)   Received from [facility], [facility]  Tobacco Use: High Risk (10/06/2023)    Readmission Risk Interventions    04/08/2023    4:58 PM  Readmission Risk Prevention Plan  Transportation Screening Complete  PCP or Specialist Appt within 5-7 Days Complete  Home Care Screening Complete  Medication Review (RN CM) Complete

## 2024-05-24 ENCOUNTER — Encounter (HOSPITAL_COMMUNITY): Payer: Self-pay

## 2024-05-24 LAB — GLUCOSE, CAPILLARY
Glucose-Capillary: 204 mg/dL — ABNORMAL HIGH (ref 70–99)
Glucose-Capillary: 169 mg/dL — ABNORMAL HIGH (ref 70–99)
Glucose-Capillary: 206 mg/dL — ABNORMAL HIGH (ref 70–99)
Glucose-Capillary: 265 mg/dL — ABNORMAL HIGH (ref 70–99)

## 2024-05-24 MED ORDER — INSULIN ASPART 100 UNIT/ML IJ SOLN
0.0000 [IU] | Freq: Every day | INTRAMUSCULAR | Status: DC
  Administered 2024-05-24: 3 [IU] via SUBCUTANEOUS

## 2024-05-24 MED ORDER — INSULIN GLARGINE-YFGN 100 UNIT/ML ~~LOC~~ SOLN
8.0000 [IU] | Freq: Every day | SUBCUTANEOUS | Status: DC
  Administered 2024-05-24 – 2024-05-25 (×2): 8 [IU] via SUBCUTANEOUS
  Filled 2024-05-24 (×3): qty 0.08

## 2024-05-24 MED ORDER — INSULIN ASPART 100 UNIT/ML IJ SOLN
0.0000 [IU] | Freq: Three times a day (TID) | INTRAMUSCULAR | Status: DC
  Administered 2024-05-24 – 2024-05-25 (×2): 3 [IU] via SUBCUTANEOUS
  Administered 2024-05-25: 2 [IU] via SUBCUTANEOUS

## 2024-05-24 MED ORDER — ADULT MULTIVITAMIN W/MINERALS CH
1.0000 | ORAL_TABLET | Freq: Every day | ORAL | Status: DC
  Administered 2024-05-24 – 2024-05-25 (×2): 1 via ORAL
  Filled 2024-05-24 (×2): qty 1

## 2024-05-24 NOTE — Plan of Care (Signed)
  Problem: Education: Goal: Ability to describe self-care measures that may prevent or decrease complications (Diabetes Survival Skills Education) will improve Outcome: Progressing Goal: Individualized Educational Video(s) Outcome: Progressing   Problem: Fluid Volume: Goal: Ability to maintain a balanced intake and output will improve Outcome: Progressing   Problem: Nutritional: Goal: Maintenance of adequate nutrition will improve Outcome: Progressing Goal: Progress toward achieving an optimal weight will improve Outcome: Progressing

## 2024-05-24 NOTE — Progress Notes (Signed)
 Nutrition Follow-up  DOCUMENTATION CODES:   Not applicable  INTERVENTION:   -Carb modified diet -MVI with minerals daily -Magic cup BID with meals, each supplement provides 290 kcal and 9 grams of protein  -Reached out DM coordinator for insulin  adjustments secondary to persistent hyperglycemia  NUTRITION DIAGNOSIS:   Inadequate oral intake related to acute illness as evidenced by NPO status.  Progressing; advanced to PO diet on 05/21/24  GOAL:   Patient will meet greater than or equal to 90% of their needs  Progressing   MONITOR:   PO intake, Supplement acceptance  REASON FOR ASSESSMENT:   Consult Enteral/tube feeding initiation and management  ASSESSMENT:   78 yo male admitted with septic shock likely secondary to RLL pneumonia, acute respiratory failure requiring intubation, hypothermia due to sepsis vs hypothyroidism, acute on chronic CHF, AKI on CKD. 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 Attempted to speak with pt via call to hospital room phone, however, unable to reach.   Case discussed with RN, who reports no complaints this AM and consumed his breakfast without difficult. Noted meal completions 70-100% on a regular diet.   No wt loss since admission.   Per TOC notes, plan to discharge to SNF once medically stable.   Labs reviewed: Phos: 2.3, CBGS: 134-315 (inpatient orders for glycemic control are 0-15 units insulin  aspart TID with meals and at bedtime).    Diet Order:   Diet Order             Diet  regular Room service appropriate? Yes; Fluid consistency: Thin  Diet effective now                   EDUCATION NEEDS:   Not appropriate for education at this time  Skin:  Skin Assessment: Reviewed RN Assessment  Last BM:  05/23/24  Height:   Ht Readings from Last 1 Encounters:  05/21/24 5\' 8"  (1.727 m)    Weight:   Wt Readings from Last 1 Encounters:  05/24/24 111.4 kg   BMI:  Body mass index is 37.34 kg/m.  Estimated Nutritional Needs:   Kcal:  1900-2100  Protein:  100-120 g  Fluid:  1.9-2.1 L    Herschel Lords, RD, LDN, CDCES Registered Dietitian III Certified Diabetes Care and Education Specialist If unable to reach this RD, please use "RD Inpatient" group chat on secure chat between hours of 8am-4 pm daily

## 2024-05-24 NOTE — Plan of Care (Signed)
  Problem: Nutritional: Goal: Maintenance of adequate nutrition will improve Outcome: Adequate for Discharge

## 2024-05-24 NOTE — Inpatient Diabetes Management (Signed)
 Inpatient Diabetes Program Recommendations  AACE/ADA: New Consensus Statement on Inpatient Glycemic Control (2015)  Target Ranges:  Prepandial:   less than 140 mg/dL      Peak postprandial:   less than 180 mg/dL (1-2 hours)      Critically ill patients:  140 - 180 mg/dL   Lab Results  Component Value Date   GLUCAP 169 (H) 05/24/2024   HGBA1C 8.5 (H) 05/18/2024    Review of Glycemic Control  Diabetes history: DM2 Outpatient Diabetes medications: Lantus  40 units daily, Jardiance  25 mg 1/2 tab in am, Metformin  XR 1000 mg BID Current orders for Inpatient glycemic control:  Novolog  0-15 units scale tid, hs, 0200  Inpatient Diabetes Program Recommendations:   Patient received Novolog  13 units over the past 24 hrs. Please consider: -Add Semglee  8 units now and daily -Decrease Novolog  correction to 0-9 units tid, 0-5 units hs   Thank you, Xee Hollman E. Halia Franey, RN, MSN, CDCES  Diabetes Coordinator Inpatient Glycemic Control Team Team Pager [phone] (8am-5pm) 05/24/2024 1:13 PM

## 2024-05-24 NOTE — Telephone Encounter (Signed)
 Need to get him into a nodule slot with that timeframe - Thanks

## 2024-05-24 NOTE — Progress Notes (Signed)
 PROGRESS NOTE    Paul [name]  WUJ:[id number] DOB: [birth year] DOA: 05/17/2024 PCP: [facility]   Brief Narrative:  This 78 years old male with PMH significant for type 2 diabetes on insulin , HFrEF 25%, lung cancer, active smoker, hypertension, CKD stage IIIa, hyperlipidemia, hypothyroidism, CAD status post CABG, COPD not on home oxygen, OSA presented in the ED with c/o: Shortness of breath since yesterday.  He was hypoxic with SpO2 of 87% on room air and was placed on CPAP.  Patient was given Solu-Medrol ,  magnesium sulfate and nebulization.  Patient continued to have apneic spells, He was placed on BiPAP. He was also started on Levophed support for hypotension. Patient's mental status has worsened and was not responsive,  Subsequently patient was admitted in the ICU. TRH pickup 05/22/2024.  Assessment & Plan:   Principal Problem:   Shock (HCC) Active Problems:   Sepsis (HCC)  Acute hypoxic and hypercapnic respiratory failure: Acute encephalopathy likely multifactorial. Community-acquired pneumonia Patient presented with worsening shortness of breath.   He was hypoxic with SpO2 of 87% on room air,  requiring BiPAP. Successfully weaned off of Levophed. Weaned off stress dose steroids. Continue ceftriaxone  for 7 days for possible Community-acquired pneumonia. Continue nebs as needed. Patient underwent successful thoracocentesis for persistent pleural effusion. He is now on 2 L of supplemental oxygen sats 90%. PT and OT recommended SNF, pending insurance authorization.  Acute on chronic HFr EF: Patient is now euvolemic. Continue home medications.  Type 2 diabetes: Blood sugar exaggerated by steroids.   Now hypoglycemic.  Encourage oral intake.  History of lung cancer: Currently not on chemotherapy.  Next follow-up outpatient.  Hypothyroidism: Continue Synthroid .  TSH still elevated but better than before.   DVT prophylaxis: Heparin  sq Code Status: Full  code Family Communication: No family at bed side.  Disposition Plan: Status is: Inpatient Remains inpatient appropriate because: Severity of illness.   Consultants:  Pulmonology  Procedures: Thoracocentesis  Antimicrobials:  Anti-infectives (From admission, onward)    Start     Dose/Rate Route Frequency Ordered Stop   05/18/24 1000  cefTRIAXone  (ROCEPHIN ) 1 g in sodium chloride  0.9 % 100 mL IVPB  Status:  Discontinued        1 g 200 mL/hr over 30 Minutes Intravenous Every 24 hours 05/18/24 0058 05/18/24 0914   05/18/24 1000  cefTRIAXone  (ROCEPHIN ) 2 g in sodium chloride  0.9 % 100 mL IVPB        2 g 200 mL/hr over 30 Minutes Intravenous Every 24 hours 05/18/24 0914     05/18/24 0400  azithromycin  (ZITHROMAX ) 500 mg in sodium chloride  0.9 % 250 mL IVPB  Status:  Discontinued        500 mg 250 mL/hr over 60 Minutes Intravenous Every 24 hours 05/18/24 0058 05/21/24 1342   05/17/24 2315  vancomycin (VANCOREADY) IVPB 2000 mg/400 mL        2,000 mg 200 mL/hr over 120 Minutes Intravenous  Once 05/17/24 2301 05/18/24 0425   05/17/24 2300  ceFEPIme (MAXIPIME) 2 g in sodium chloride  0.9 % 100 mL IVPB        2 g 200 mL/hr over 30 Minutes Intravenous  Once 05/17/24 2248 05/17/24 2349   05/17/24 2300  metroNIDAZOLE (FLAGYL) IVPB 500 mg        500 mg 100 mL/hr over 60 Minutes Intravenous  Once 05/17/24 2248 05/18/24 0249   05/17/24 2300  vancomycin (VANCOCIN) IVPB 1000 mg/200 mL premix  Status:  Discontinued  1,000 mg 200 mL/hr over 60 Minutes Intravenous  Once 05/17/24 2248 05/17/24 2301      Subjective: Patient was seen and examined at bedside. Overnight events noted. Patient was lying comfortably in the bed,  states he is feeling better. He underwent thoracocentesis.  Feels much improved afterwards. Patient is now medically cleared , awaiting SNF / insurance authorization  Objective: Vitals:   05/24/24 0322 05/24/24 0756 05/24/24 0813 05/24/24 1225  BP: (!) 144/80  138/87  (!) 134/94  Pulse: 73  73   Resp: 18  18 18   Temp: 98.4 F (36.9 C)  98.7 F (37.1 C) 98 F (36.7 C)  TempSrc: Oral  Oral Oral  SpO2: 95% 97% 96%   Weight: 111.4 kg     Height:        Intake/Output Summary (Last 24 hours) at 05/24/2024 1254 Last data filed at 05/24/2024 0353 Gross per 24 hour  Intake --  Output 650 ml  Net -650 ml   Filed Weights   05/22/24 0315 05/23/24 0500 05/24/24 0322  Weight: 109.3 kg 110.6 kg 111.4 kg    Examination:  General exam: Appears calm and comfortable , not in any acute distress. Respiratory system: CTA Bilaterally . Respiratory effort normal.  RR 15 Cardiovascular system: S1 & S2 heard, RRR. No JVD, murmurs, rubs, gallops or clicks.  Gastrointestinal system: Abdomen is non distended, soft and non tender.  Normal bowel sounds heard. Central nervous system: Alert and oriented x 3. No focal neurological deficits. Extremities: No edema, no cyanosis, no clubbing Skin: No rashes, lesions or ulcers Psychiatry: Judgement and insight appear normal. Mood & affect appropriate.     Data Reviewed: I have personally reviewed following labs and imaging studies  CBC: Recent Labs  Lab 05/17/24 2146 05/17/24 2209 05/18/24 0056 05/18/24 0253 05/18/24 0500 05/21/24 0444 05/22/24 0229 05/23/24 0434  WBC 4.6   < > 4.6  --  4.8 12.8* 11.2* 9.2  NEUTROABS 2.9  --   --   --   --   --   --   --   HGB 12.0*   < > 10.2* 9.9* 10.2* 11.2* 11.0* 11.1*  HCT 37.7*   < > 32.4* 29.0* 32.5* 35.6* 34.8* 35.1*  MCV 99.5   < > 98.8  --  99.1 98.9 97.2 99.2  PLT 163   < > 171  --  180 152 146* 150   < > = values in this interval not displayed.   Basic Metabolic Panel: Recent Labs  Lab 05/18/24 0022 05/18/24 0056 05/18/24 0500 05/18/24 1649 05/19/24 0515 05/19/24 1834 05/20/24 0408 05/21/24 0444 05/22/24 0229 05/22/24 0231 05/23/24 0443  NA 147*   < > 144  --  143  --  143 148* 145 144 142  K 4.3   < > 4.6  --  4.8  --  3.6 3.7 4.1 4.1 3.6  CL 114*   --  112*  --  109  --  106 109 110 110 110  CO2 26  --  25  --  21*  --  25 29 26 26 27   GLUCOSE 96  --  127*  --  214*  --  262* 73 171* 168* 151*  BUN 28*  --  29*  --  44*  --  55* 51* 47* 47* 37*  CREATININE 1.91*   < > 2.08*  --  2.63*  --  2.67* 2.24* 2.10* 2.07* 1.86*  CALCIUM  9.1  --  8.5*  --  8.5*  --  8.4* 8.8* 8.5* 8.4* 8.2*  MG 2.5*  --  2.5* 2.2 2.2 2.2  --   --   --   --   --   PHOS 3.6  --  3.3 4.2 4.8* 4.3  --   --   --  3.3 2.3*   < > = values in this interval not displayed.   GFR: Estimated Creatinine Clearance: 39 mL/min (A) (by C-G formula based on SCr of 1.86 mg/dL (H)). Liver Function Tests: Recent Labs  Lab 05/17/24 2309 05/22/24 0231 05/23/24 0443  AST 47*  --   --   ALT 27  --   --   ALKPHOS 71  --   --   BILITOT 0.8  --   --   PROT 6.5  --   --   ALBUMIN 3.2* 3.0* 3.0*   No results for input(s): "LIPASE", "AMYLASE" in the last 168 hours. No results for input(s): "AMMONIA" in the last 168 hours. Coagulation Profile: Recent Labs  Lab 05/17/24 2309  INR 1.3*   Cardiac Enzymes: No results for input(s): "CKTOTAL", "CKMB", "CKMBINDEX", "TROPONINI" in the last 168 hours. BNP (last 3 results) No results for input(s): "PROBNP" in the last 8760 hours. HbA1C: No results for input(s): "HGBA1C" in the last 72 hours. CBG: Recent Labs  Lab 05/23/24 1133 05/23/24 1621 05/23/24 2048 05/24/24 0819 05/24/24 1226  GLUCAP 192* 209* 193* 204* 169*   Lipid Profile: No results for input(s): "CHOL", "HDL", "LDLCALC", "TRIG", "CHOLHDL", "LDLDIRECT" in the last 72 hours. Thyroid  Function Tests: Recent Labs    05/22/24 0229  TSH 27.587*   Anemia Panel: No results for input(s): "VITAMINB12", "FOLATE", "FERRITIN", "TIBC", "IRON", "RETICCTPCT" in the last 72 hours. Sepsis Labs: Recent Labs  Lab 05/17/24 2315 05/18/24 0028 05/18/24 1838 05/18/24 2126 05/19/24 0515  PROCALCITON  --   --   --   --  0.16  LATICACIDVEN 1.6 1.8 1.5 1.6  --     Recent  Results (from the past 240 hours)  Blood Culture (routine x 2)     Status: None   Collection Time: 05/17/24 10:59 PM   Specimen: BLOOD LEFT ARM  Result Value Ref Range Status   Specimen Description BLOOD LEFT ARM  Final   Special Requests   Final    BOTTLES DRAWN AEROBIC AND ANAEROBIC Blood Culture results may not be optimal due to an inadequate volume of blood received in culture bottles   Culture   Final    NO GROWTH 5 DAYS Performed at [facility], [address], [place], [place] [address]    Report Status 05/22/2024 FINAL  Final  Blood Culture (routine x 2)     Status: None   Collection Time: 05/18/24 12:19 AM   Specimen: BLOOD LEFT HAND  Result Value Ref Range Status   Specimen Description BLOOD LEFT HAND  Final   Special Requests   Final    BOTTLES DRAWN AEROBIC AND ANAEROBIC Blood Culture results may not be optimal due to an inadequate volume of blood received in culture bottles   Culture   Final    NO GROWTH 5 DAYS Performed at [facility], [address] [place], [place] [address]    Report Status 05/23/2024 FINAL  Final  MRSA Next Gen by PCR, Nasal     Status: None   Collection Time: 05/18/24  1:32 AM   Specimen: Nasal Mucosa; Nasal Swab  Result Value Ref Range Status  MRSA by PCR Next Gen NOT DETECTED NOT DETECTED Final    Comment: (NOTE) The GeneXpert MRSA Assay (FDA approved for NASAL specimens only), is one component of a comprehensive MRSA colonization surveillance program. It is not intended to diagnose MRSA infection nor to guide or monitor treatment for MRSA infections. Test performance is not FDA approved in patients less than 63 years old. Performed at [facility], [address], [place], [place] [address]     Radiology Studies: No results found.  Scheduled Meds:  heparin   5,000 Units Subcutaneous Q8H   insulin  aspart  0-15 Units Subcutaneous TID PC & HS   levothyroxine   125 mcg Oral Q0600   multivitamin with minerals  1  tablet Oral Daily   revefenacin  175 mcg Nebulization Daily   Continuous Infusions:  cefTRIAXone  (ROCEPHIN )  IV 2 g (05/24/24 0950)     LOS: 6 days    Time spent: 35 Mins    Magdalene School, MD [facility]   If 7PM-7AM, please contact night-coverage

## 2024-05-24 NOTE — Plan of Care (Signed)
  Problem: Activity: Goal: Capacity to carry out activities will improve Outcome: Adequate for Discharge   

## 2024-05-24 NOTE — TOC Progression Note (Addendum)
 Transition of Care [facility]) - Progression Note    Patient Details  Name: Paul [name] MRN: [id number] Date of Birth: 1946-06-06  [facility] CM/SW Contact  Carmon Christen, LCSWA Phone Number: 05/24/2024, 12:00 PM  Clinical Narrative:     CSW spoke with patient and patients [name] at bedside.[name] confirmed patient accepted  SNF bed offer with [facility]. [name] confirmed patient will be using his medicare benefit for rehab. All questions answered. No further questions reported at this time. CSW informed Kia with [facility]. Kia is going to check patients medicare benefit and request to see patient. CSW informed patient and patients daughter who is agreeable. Kia informed CSW that she will get back with CSW once sees patient and checks medicare benefit.  Update- Kia informed CSW facility can accept patient today if medically stable. CSW informed MD.  Expected Discharge Plan: Skilled Nursing Facility Barriers to Discharge: Continued Medical Work up  Expected Discharge Plan and Services                                               Social Determinants of Health (SDOH) Interventions SDOH Screenings   Food Insecurity: Patient Unable To Answer (05/18/2024)  Housing: Unknown (05/24/2024)  Transportation Needs: No Transportation Needs (05/24/2024)  Utilities: Patient Unable To Answer (05/18/2024)  Alcohol Screen: Low Risk  (05/24/2024)  Financial Resource Strain: Low Risk  (05/24/2024)  Social Connections: Patient Unable To Answer (05/18/2024)  Stress: No Stress Concern Present (02/03/2023)   Received from [facility], [facility]  Tobacco Use: High Risk (05/24/2024)    Readmission Risk Interventions    04/08/2023    4:58 PM  Readmission Risk Prevention Plan  Transportation Screening Complete  PCP or Specialist Appt within 5-7 Days Complete  Home Care Screening Complete  Medication Review (RN CM) Complete

## 2024-05-24 NOTE — Progress Notes (Signed)
 SATURATION QUALIFICATIONS: (This note is used to comply with regulatory documentation for home oxygen)  Patient Saturations on Room Air at Rest = 94%  Patient Saturations on Room Air while Ambulating = 83%  Patient Saturations on 2 Liters of oxygen while Ambulating = 91%  Please briefly explain why patient needs home oxygen:Pt requires 2LO2 with activity to keep sats > 90%.  Ashely Joshua M,PT Acute Rehab Services [phone]

## 2024-05-24 NOTE — Progress Notes (Signed)
 Heart Failure Nurse Navigator Progress Note  PCP: [facility] Diagnosis: Acute respiratory failure with hypoxia, Cardiogenic shock.  Admitted from: [facility] via [facility]  Presentation:   Paul [name] presented with shortness of breath x 2 days, 87 % on RA when EMS arrived, placed on CPAP and increased to 100 %. Was given nebulizer treatments in the field. Has 2+ BLE edema, which patient reports to being worse from his normal. BP 79/56, HR 48, BNP 249.6, Troponin 38, Temp 95, placed on bear hugger, CXr showed mild edema, Lactate normal, WBC count normal, started on Levophed for hypotension, IV antibiotics for possible sepsis. Patient later intubated due to decrease in mental status and apneic episodes. Admitted to ICU, extubated 05/20/2024. And transferred to progressive floor on 2 L [place]. Awaiting insurance auth for SNF Rehab.   Patient and daughter were both educated on the sign and symptoms of heart failure, daily weights, when to call his doctor or go to the ED, Diet/ fluid restrictions, patient reports to drinking lots of water and eating out for most meals. Continued education on taking all medication as prescribed, patient reported to often forgetting to take his medication, daughter will help him get a pill box, and continued education on attending all medical appointments, Patient and daughter both verbalized their understanding of education, a Referral order was placed on 05/24/2024 for the [place] for prior auth for a HF TOC follow up appointment. When approved , Please call the daughter Paul [name] to schedule his HF [facility] appointment.   ECHO/ LVEF: < 20%  Clinical Course:  Past Medical History:  Diagnosis Date   CHF (congestive heart failure) (HCC)    Coronary artery disease    Diabetes mellitus without complication (HCC)    History of radiation therapy    Right lung- 11/27/21-01/10/22- Dr. Retta Caster   Hypertension    Hypothyroidism    Ischemic  cardiomyopathy    Pneumonia    a long time ago   Sleep apnea    no longer uses a cpap     Social History   Socioeconomic History   Marital status: Single    Spouse name: Not on file   Number of children: 3   Years of education: Not on file   Highest education level: High school graduate  Occupational History   Occupation: retired  Tobacco Use   Smoking status: Every Day    Current packs/day: 0.50    Average packs/day: 0.5 packs/day for 60.4 years (30.2 ttl pk-yrs)    Types: Cigarettes    Start date: 1965   Smokeless tobacco: Never   Tobacco comments:    Started smoking in [place] age 51  Vaping Use   Vaping status: Never Used  Substance and Sexual Activity   Alcohol use: No   Drug use: Never   Sexual activity: Not on file  Other Topics Concern   Not on file  Social History Narrative   Not on file   Social Drivers of Health   Financial Resource Strain: Low Risk  (05/24/2024)   Overall Financial Resource Strain (CARDIA)    Difficulty of Paying Living Expenses: Not hard at all  Food Insecurity: Patient Unable To Answer (05/18/2024)   Hunger Vital Sign    Worried About Running Out of Food in the Last Year: Patient unable to answer    Ran Out of Food in the Last Year: Patient unable to answer  Transportation Needs: No Transportation Needs (05/24/2024)   PRAPARE -  Administrator, Civil Service (Medical): No    Lack of Transportation (Non-Medical): No  Physical Activity: Not on file  Stress: No Stress Concern Present (02/03/2023)   Received from [facility], [facility] of Occupational Health - Occupational Stress Questionnaire    Feeling of Stress : Not at all  Social Connections: Patient Unable To Answer (05/18/2024)   Social Connection and Isolation Panel [NHANES]    Frequency of Communication with Friends and Family: Patient unable to answer    Frequency of Social Gatherings with Friends and Family: Patient unable to answer     Attends Religious Services: Patient unable to answer    Active Member of Clubs or Organizations: Patient unable to answer    Attends Banker Meetings: Patient unable to answer    Marital Status: Patient unable to answer   Education Assessment and Provision:  Detailed education and instructions provided on heart failure disease management including the following:  Signs and symptoms of Heart Failure When to call the physician Importance of daily weights Low sodium diet Fluid restriction Medication management Anticipated future follow-up appointments  Patient education given on each of the above topics.  Patient acknowledges understanding via teach back method and acceptance of all instructions.  Education Materials:  "Living Better With Heart Failure" Booklet, HF zone tool, & Daily Weight Tracker Tool.  Patient has scale at home: Yes Patient has pill box at home: No, daughter will get him one.     High Risk Criteria for Readmission and/or Poor Patient Outcomes: Heart failure hospital admissions (last 6 months): 0  No Show rate: 10 % Difficult social situation: No, lives alone in apartment.  Demonstrates medication adherence: No, reports he often forgets to take his medication Primary Language: English Literacy level: Reading, writing, and comprehension.   Barriers of Care:   Medication compliance Diet/ fluid restrictions/ daily weights( salt/ water amounts)  Smoking cessation  Considerations/Referrals:   Referral made to Heart Failure Pharmacist Stewardship: 0 Referral made to Heart Failure CSW/NCM TOC: 10% Referral made to Heart & Vascular TOC clinic: Patient has [facility] insurance, a Referral order for HF TOC auth was placed on 05/24/2024.   Items for Follow-up on DC/TOC: Medication compliance Diet/ fluid restrictions/ daily weights Smoking cessation Continued HF education    Randie Bustle, BSN, RN Heart Failure Print production planner Chat Only

## 2024-05-24 NOTE — Progress Notes (Signed)
 Pt refused second attempt at AM labs

## 2024-05-24 NOTE — Progress Notes (Signed)
Patient refused AM lab

## 2024-05-24 NOTE — Progress Notes (Signed)
 Physical Therapy Treatment Patient Details Name: Paul [name] MRN: [id number] DOB: 20-Dec-1946 Today's Date: 05/24/2024   History of Present Illness 78 y/o male with who presented to [facility] on 5/20 with SOB who then became unresponsive. Intubated on 5/20, extubated 5/23. Pt found to have acute hypoxic and hypercapnic respiratory failure, acute encephalopathy, and suspected community-acquired PNA. Cxr showing mild pulmonary edema/congestion. Pt now s/p thoracentesis on 5/24. 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 Pt was able to ambulate with RW but needs mod assist of 2 for safety and mod cues.  Pt fatigues quickly and has to have chair follow for safety. Also O2 saturations ok on RA at rest but needs 2LO2 with activity to keep sats >88%. Needs continued therapy.   Pt currently with functional limitations due to the deficits listed below (see PT Problem List). Pt will benefit from acute skilled PT to increase their independence and safety with mobility to allow discharge.       If plan is discharge home, recommend the following: A lot of help with walking and/or transfers;A lot of help with bathing/dressing/bathroom;Assist for transportation;Help with stairs or ramp for entrance;Supervision due to cognitive status;Direct supervision/assist for medications management;Direct supervision/assist for financial management   Can travel by private vehicle     No  Equipment Recommendations  Rolling walker (2 wheels)    Recommendations for Other Services       Precautions / Restrictions Precautions Precautions: Fall Precaution/Restrictions Comments: impaired memory Restrictions Weight Bearing Restrictions Per Provider Order: No     Mobility  Bed Mobility Overal bed mobility: Needs Assistance Bed Mobility: Supine to Sit     Supine to sit:  HOB elevated, Used rails, Contact guard     General bed mobility comments: Incr time needed however no assist needed.    Transfers Overall transfer level: Needs assistance Equipment used: Rolling walker (2 wheels) Transfers: Sit to/from Stand, Bed to chair/wheelchair/BSC Sit to Stand: Min assist, +2 physical assistance, From elevated surface           General transfer comment: verbal cues for hand placement/technique and safety.  Min assist +2 for power up as pt is slow to rise and needs assist.    Ambulation/Gait Ambulation/Gait assistance: Mod assist, +2 safety/equipment Gait Distance (Feet): 20 Feet (20 feet, 20 feet, 10 feet) Assistive device: Rolling walker (2 wheels) Gait Pattern/deviations: Step-to pattern, Decreased stride length, Trunk flexed, Wide base of support, Drifts right/left, Antalgic Gait velocity: dec Gait velocity interpretation: <1.31 ft/sec, indicative of household ambulator   General Gait Details: pt with impaired co-ordination and sequencing of stepping, ataxic like gait pattern, modA to maintain stability at times, needed cues to stand tall and for step sequencing, 2nd person for chair follow.   Stairs             Wheelchair Mobility     Tilt Bed    Modified Rankin (Stroke Patients Only)       Balance Overall balance assessment: Needs assistance Sitting-balance support: No upper extremity supported, Feet supported Sitting balance-Leahy Scale: Fair     Standing balance support: Bilateral upper extremity supported, During functional activity, Reliant on assistive device for balance Standing balance-Leahy Scale: Poor Standing balance comment: reliant on external support +2 and use of RW  Communication Communication Communication: Impaired Factors Affecting Communication: Reduced clarity of speech (soft spoken; requires increased time for processing)  Cognition Arousal: Alert Behavior During  Therapy: Flat affect   PT - Cognitive impairments: Orientation, Awareness, Memory, Attention, Sequencing, Problem solving, Safety/Judgement   Orientation impairments: Place, Time, Situation                     Following commands: Impaired Following commands impaired: Follows one step commands with increased time, Only follows one step commands consistently    Cueing Cueing Techniques: Verbal cues, Tactile cues, Visual cues  Exercises General Exercises - Lower Extremity Long Arc Quad: AROM, Both, 10 reps, Seated Hip Flexion/Marching: AROM, Both, 10 reps, Seated    General Comments General comments (skin integrity, edema, etc.): 73 bpm, 98% RA, 138/87; Desat to 83% on RA with activity needing 2L to keep sats > 90%.      Pertinent Vitals/Pain Pain Assessment Pain Assessment: No/denies pain    Home Living                          Prior Function            PT Goals (current goals can now be found in the care plan section) Acute Rehab PT Goals Patient Stated Goal: didn't state Progress towards PT goals: Progressing toward goals    Frequency    Min 2X/week      PT Plan      Co-evaluation              AM-PAC PT "6 Clicks" Mobility   Outcome Measure  Help needed turning from your back to your side while in a flat bed without using bedrails?: A Little Help needed moving from lying on your back to sitting on the side of a flat bed without using bedrails?: A Little Help needed moving to and from a bed to a chair (including a wheelchair)?: A Lot Help needed standing up from a chair using your arms (e.g., wheelchair or bedside chair)?: A Lot Help needed to walk in hospital room?: A Lot Help needed climbing 3-5 steps with a railing? : Total 6 Click Score: 13    End of Session Equipment Utilized During Treatment: Gait belt;Oxygen Activity Tolerance: Patient tolerated treatment well;Patient limited by fatigue Patient left: in chair;with call  bell/phone within reach;with chair alarm set Nurse Communication: Mobility status PT Visit Diagnosis: Unsteadiness on feet (R26.81);Muscle weakness (generalized) (M62.81)     Time: 6213-0865 PT Time Calculation (min) (ACUTE ONLY): 27 min  Charges:    $Gait Training: 23-37 mins PT General Charges $$ ACUTE PT VISIT: 1 Visit                     Audelia Knape M,PT Acute Rehab Services [phone]    Florencia Hunter 05/24/2024, 10:39 AM

## 2024-05-25 ENCOUNTER — Other Ambulatory Visit: Payer: Self-pay

## 2024-05-25 LAB — RENAL FUNCTION PANEL
Albumin: 2.9 g/dL — ABNORMAL LOW (ref 3.5–5.0)
Anion gap: 10 (ref 5–15)
BUN: 27 mg/dL — ABNORMAL HIGH (ref 8–23)
CO2: 25 mmol/L (ref 22–32)
Calcium: 8.6 mg/dL — ABNORMAL LOW (ref 8.9–10.3)
Chloride: 108 mmol/L (ref 98–111)
Creatinine, Ser: 1.48 mg/dL — ABNORMAL HIGH (ref 0.61–1.24)
GFR, Estimated: 48 mL/min — ABNORMAL LOW (ref >60)
Glucose, Bld: 199 mg/dL — ABNORMAL HIGH (ref 70–99)
Phosphorus: 1.8 mg/dL — ABNORMAL LOW (ref 2.5–4.6)
Potassium: 3.8 mmol/L (ref 3.5–5.1)
Sodium: 143 mmol/L (ref 135–145)

## 2024-05-25 LAB — GLUCOSE, CAPILLARY
Glucose-Capillary: 164 mg/dL — ABNORMAL HIGH (ref 70–99)
Glucose-Capillary: 203 mg/dL — ABNORMAL HIGH (ref 70–99)

## 2024-05-25 LAB — CBC
HCT: 32.9 % — ABNORMAL LOW (ref 39.0–52.0)
Hemoglobin: 10.5 g/dL — ABNORMAL LOW (ref 13.0–17.0)
MCH: 30.8 pg (ref 26.0–34.0)
MCHC: 31.9 g/dL (ref 30.0–36.0)
MCV: 96.5 fL (ref 80.0–100.0)
Platelets: 110 10*3/uL — ABNORMAL LOW (ref 150–400)
RBC: 3.41 MIL/uL — ABNORMAL LOW (ref 4.22–5.81)
RDW: 17.6 % — ABNORMAL HIGH (ref 11.5–15.5)
WBC: 6.7 10*3/uL (ref 4.0–10.5)
nRBC: 0.3 % — ABNORMAL HIGH (ref 0.0–0.2)

## 2024-05-25 LAB — CYTOLOGY - NON PAP

## 2024-05-25 MED ORDER — INSULIN GLARGINE 100 UNIT/ML SOLOSTAR PEN: 20.0000 [IU] | PEN_INJECTOR | Freq: Every day | SUBCUTANEOUS | Status: AC

## 2024-05-25 NOTE — Progress Notes (Signed)
 Occupational Therapy Treatment Patient Details Name: Paul [name] MRN: [id number] DOB: [birth year] Today's Date: 05/25/2024   History of present illness 78 y/o male with who presented to [facility] on 5/20 with SOB who then became unresponsive. Intubated on 5/20, extubated 5/23. Pt found to have acute hypoxic and hypercapnic respiratory failure, acute encephalopathy, and suspected community-acquired PNA. Cxr showing mild pulmonary edema/congestion. Pt now s/p thoracentesis on 5/24. 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 Pt able to accurately set up box for medications taken once a day at bedtime, every otther day at no specific time of day, and once a day at no specific time of day. Pt unable to accurately set up medications with directions to be taken at breakfast and dinner, or three times per day. Pt unable to self-correct mistakes with cues. Pt aware of difficulty in setting up the pill box but instead of requesting help, pt asking for breaks due to being "tired." OT recommends either daily assistance of family for medication management or use of an automatic medication dispenser set up by family weekly with alarm to alert pt and feature to alert daughter or other chosen contact if pt not taking medication as dispensed. OT educated pt and his daughter in recommendation with both verbalizing understanding and agreement and with pt stating he thinks medication dispenser "would make it a lot easier." Pt participated well in session and is making progress toward goals. Pt VSS on RA throughout session. Plan for pt to discharge later today for intensive inpatient skilled rehab services < 3 hours per day. If pt does not discharge as planned, he will benefit from  continued acute skilled OT services to address deficits outlined below and increase safety and independence with functional tasks.       If plan is discharge home, recommend the following:  A little help with walking and/or transfers;A lot of help with bathing/dressing/bathroom;Assistance with cooking/housework;Direct supervision/assist for financial management;Direct supervision/assist for medications management;Assist for transportation;Help with stairs or ramp for entrance;Supervision due to cognitive status   Equipment Recommendations  Tub/shower bench;BSC/3in1;Other (comment) (RW)    Recommendations for Other Services      Precautions / Restrictions Precautions Precautions: Fall Recall of Precautions/Restrictions: Impaired Precaution/Restrictions Comments: impaired memory Restrictions Weight Bearing Restrictions Per Provider Order: No       Mobility Bed Mobility Overal bed mobility: Needs Assistance Bed Mobility: Rolling Rolling: Min assist         General bed mobility comments: Repositioned in midline in the bed with Mod assist at beginning of session due to pt with heavy Left lateral lean in bed. Pt noted also to have mild Left lateral lean during session and requiring occasional verbal cues to Min assist to correct into midline positioning.    Transfers Overall transfer level: Needs assistance                       Balance Overall balance assessment: Needs assistance     Sitting balance - Comments: Pt with noted heavy Left lateral lean in bed with HOB elevated upon OT arrival. Pt noted also to have mild Left lateral lean during session and requiring occasional verbal cues to Min assist to correct into midline positioning.  ADL either performed or assessed with clinical judgement   ADL Overall ADL's : Needs assistance/impaired Eating/Feeding: Set up;Sitting                                      General ADL Comments: ADLs largely not addressed this session    Extremity/Trunk Assessment Upper Extremity Assessment Upper Extremity Assessment: Left hand dominant;Generalized weakness;RUE deficits/detail;LUE deficits/detail RUE Deficits / Details: generalized weakness; decreased fine motor coordination (worse on Left); edematous throughout (worse on Right) RUE Coordination: decreased fine motor LUE Deficits / Details: generalized weakness; decreased fine motor coordination (worse on Left); edematous throughout (worse on Right) LUE Coordination: decreased fine motor   Lower Extremity Assessment Lower Extremity Assessment: Defer to PT evaluation        Vision       Perception     Praxis     Communication Communication Communication: Impaired Factors Affecting Communication: Reduced clarity of speech (soft spoken, delayed, muffled some)   Cognition Arousal: Alert Behavior During Therapy: Flat affect Cognition: Cognition impaired   Orientation impairments: Time Awareness: Intellectual awareness intact, Online awareness impaired Memory impairment (select all impairments): Short-term memory, Working [employer] fast streamer, Engineer, structural memory Attention impairment (select first level of impairment): Alternating attention Executive functioning impairment (select all impairments): Organization, Reasoning, Problem solving OT - Cognition Comments: Pt pleasant and participating well throughout session. Pill box assessment completed this session with pt failing assessment.                 Following commands: Impaired Following commands impaired: Follows one step commands with increased time, Only follows one step commands consistently      Cueing   Cueing Techniques: Verbal cues, Visual cues, Gestural cues  Exercises      Shoulder Instructions       General Comments VSS on RA throughout session. Pt with increased labor of breathing and reporting feeling of SOB with  activity , but with O2 sat remaining >/94% on RA throughout session. Pt's daughter present throughout session. RN and Chaplain each present during a portion of session.    Pertinent Vitals/ Pain       Pain Assessment Pain Assessment: No/denies pain  Home Living                                          Prior Functioning/Environment              Frequency  Min 2X/week        Progress Toward Goals  OT Goals(current goals can now be found in the care plan section)  Progress towards OT goals: Progressing toward goals  Acute Rehab OT Goals Patient Stated Goal: to get stronger and return home  Plan      Co-evaluation                 AM-PAC OT "6 Clicks" Daily Activity     Outcome Measure   Help from another person eating meals?: A Little Help from another person taking care of personal grooming?: A Little Help from another person toileting, which includes using toliet, bedpan, or urinal?: A Lot Help from another person bathing (including washing, rinsing, drying)?: A Lot Help from another person to put on and taking off regular upper body clothing?: A Little Help from another person to put  on and taking off regular lower body clothing?: A Lot 6 Click Score: 15    End of Session    OT Visit Diagnosis: Other symptoms and signs involving cognitive function;Other (comment) (decreased activity tolerance)   Activity Tolerance Patient tolerated treatment well;Patient limited by fatigue   Patient Left in bed;with call bell/phone within reach;with bed alarm set;with family/visitor present   Nurse Communication Mobility status;Other (comment) (Pt not able to accurately set-up pill box and will need assistance with medication management in the home. OT recommends a medication dispenser.)        Time: 1025-1110 OT Time Calculation (min): 45 min  Charges: OT General Charges $OT Visit: 1 Visit OT Treatments $Self Care/Home Management : 38-52  mins  Paul [name] "Paul [name]., OTR/L, MA Acute Rehab [phone]   Paul [name] 05/25/2024, 1:48 PM

## 2024-05-25 NOTE — Progress Notes (Signed)
 Preparing pt for discharge, daughter at bedside. Sn reviewed discharge instructions with pt and daughter. Both verbalize understanding. VSS and WNL, pt alert and oriented, no s/s of distress noted. Both IV's removed, cath intact. Tele removed. Report given to transport and Tiffany LPN.

## 2024-05-25 NOTE — Progress Notes (Signed)
 Pt was alert and oriented . [place]

## 2024-05-25 NOTE — Discharge Summary (Signed)
 Physician Discharge Summary   Patient: Paul [name] MRN: [id number] DOB: Jun 04, 1946  Admit date:     05/17/2024  Discharge date: 05/25/24  Discharge Physician: Ephriam Hashimoto   PCP: [facility], [facility]     Recommendations at discharge:  Follow up with new PCP at [facility] as soon as able [employer]: Please check BMP and CBC in 1 week (discharge Cr 1.5, Hgb 10.5) Resume Entresto  if Cr stable and BP remains >120 systolic Monitor glucose three times daily and increase glargine insulin  as appropriate      Discharge Diagnoses: Principal Problem:   Septic shock due to Hflu pneumonia Active Problems:   Acute respiratory failure   Congestive heart failure   Diabetes   Lung cancer   Hypothyroidism   CKD IIIa     Hospital Course: 78 y.o. M with IDDM, HFrEF 25%, lung cancer, HTN, CKD IIIa, HLD, hypothyroidism, CAD s/p CABG, OSA not on CPAP, COPD not on O2 who presented with SOB, found to have septic shock.       Acute hypoxic and hypercapnic respiratory failure Acute encephalopathy likely multifactorial. Community-acquired pneumonia Patient presented with shortness of breath which deteriorated until he required BiPAP and Levophed .   He was weaned off of stress dose steroids and completed 7 days antibiotics for community-acquired pneumonia.  He underwent thoracentesis, pleural fluid without malignant cells, appeared exudative/parapneumonic.     Acute on chronic systolic congestive heart failure EF 25%. He was treated with diuretics, now appears euvolemic.    Resume Entresto  as appropriate.   Type 2 diabetes: Blood sugar exaggerated by steroids.  Subsequently hypoglycemic when steroids stopped.  Needs monitoring and titration of glargine.      History of lung cancer: Currently not on chemotherapy.      Hypothyroidism: Stable on Synthroid            The [place]  Controlled Substances Registry was reviewed for this patient  prior to discharge.   Consultants: Critical Care Procedures performed: Thoracentesis  Disposition: Skilled nursing facility Diet recommendation:  Cardiac diet  DISCHARGE MEDICATION: Allergies as of 05/25/2024   No Known Allergies      Medication List     PAUSE taking these medications    sacubitril -valsartan  49-51 MG Wait to take this until your doctor or other care provider tells you to start again. Commonly known as: ENTRESTO  Take 1 tablet by mouth 2 (two) times daily.       TAKE these medications    albuterol  108 (90 Base) MCG/ACT inhaler Commonly known as: VENTOLIN  HFA Inhale 2 puffs into the lungs every 6 (six) hours as needed for wheezing or shortness of breath.   albuterol  (2.5 MG/3ML) 0.083% nebulizer solution Commonly known as: PROVENTIL  Inhale 3 mLs into the lungs 3 (three) times daily as needed for shortness of breath.   ammonium lactate 12 % lotion Commonly known as: LAC-HYDRIN Apply 1 Application topically daily. Apply as directed daily to affected area on back after shower.   aspirin  81 MG chewable tablet Take 81 mg by mouth daily.   atorvastatin  80 MG tablet Commonly known as: LIPITOR Take 80 mg by mouth at bedtime.   carvedilol  6.25 MG tablet Commonly known as: COREG  Take 6.25 mg by mouth 2 (two) times daily with a meal.   clobetasol cream 0.05 % Commonly known as: TEMOVATE Apply 1 Application topically 2 (two) times daily as needed (back pain).   empagliflozin  25 MG Tabs tablet Commonly known as: JARDIANCE  Take 0.5 tablets by mouth  every morning.   fluticasone  50 MCG/ACT nasal spray Commonly known as: FLONASE  Place 2 sprays into both nostrils daily. What changed:  how much to take when to take this   insulin  glargine 100 UNIT/ML Solostar Pen Commonly known as: LANTUS  Inject 20 Units into the skin daily. What changed: how much to take   levothyroxine  125 MCG tablet Commonly known as: Synthroid  Take 1 tablet (125 mcg total) by  mouth daily before breakfast.   Lubricant Eye Drops (PF) 0.4-0.3 % Soln Generic drug: Polyethyl Glyc-Propyl Glyc PF Place 1-2 drops into both eyes as needed (dry,red, irritated eyes).   metFORMIN  500 MG 24 hr tablet Commonly known as: GLUCOPHAGE -XR Take 1,000 mg by mouth 2 (two) times daily with a meal.   pantoprazole  40 MG tablet Commonly known as: PROTONIX  Take 1 tablet (40 mg total) by mouth daily.   urea 10 % cream Commonly known as: CARMOL Apply 1 Application topically daily. Apply as directed to affected area on back daily after showers.        Contact information for after-discharge care     Destination     [facility] Preferred SNF .   Service: Skilled Nursing Contact information: [address] [place]  [address] [phone]                     Discharge Instructions     AMB referral to CHF clinic   Complete by: As directed    [facility] patient, need prior auth for HF TOC appt.  EF <20%   Reason for referral: Post Hosp/TOC   Diet - low sodium heart healthy   Complete by: As directed    Discharge instructions   Complete by: As directed    **IMPORTANT DISCHARGE INSTRUCTIONS**   From Dr. Darlyn Eke: You were admitted for pneumonia  Here, you required medicine to keep your blood pressure up and a BiPAP to help you breathe.  You were treated with antibiotics and finished the complete course  You were treated with a thoracentesis, to remove fluid from around the lung, this fluid showed no new cancer.  You should establishe with a new primary doctor in [place] as soon as possible   Increase activity slowly   Complete by: As directed        Discharge Exam: Filed Weights   05/22/24 0315 05/23/24 0500 05/24/24 0322  Weight: 109.3 kg 110.6 kg 111.4 kg    General: Pt is alert, awake, not in acute distress Cardiovascular: RRR, nl S1-S2, no murmurs appreciated.   No LE edema.   Respiratory: Normal respiratory rate and  rhythm.  CTAB without rales or wheezes. Abdominal: Abdomen soft and non-tender.  No distension or HSM.   Neuro/Psych: Strength symmetric in upper and lower extremities.  Judgment and insight appear normal.   Condition at discharge: fair  The results of significant diagnostics from this hospitalization (including imaging, microbiology, ancillary and laboratory) are listed below for reference.   Imaging Studies: DG Chest Port 1 View Result Date: 05/21/2024 CLINICAL DATA:  Status post thoracentesis. History of non-small-cell lung cancer EXAM: PORTABLE CHEST 1 VIEW COMPARISON:  Chest x-ray 05/18/2024 and older FINDINGS: Enteric tube no longer seen. Stable right IJ catheter. Enlarged cardiopericardial silhouette with some vascular congestion. Sternal wires. Small right pleural effusion and tiny left. Adjacent lung opacities. No pneumothorax seen. Overlapping cardiac leads. IMPRESSION: No pneumothorax seen post thoracentesis. Small right and tiny left pleural effusion with adjacent opacities. Postop chest with vascular congestion.  Enlarged  heart. Right IJ catheter. Electronically Signed   By: Adrianna Horde M.D.   On: 05/21/2024 15:08   ECHOCARDIOGRAM COMPLETE Result Date: 05/18/2024    ECHOCARDIOGRAM REPORT   Patient Name:   CHARLETON DEYOUNG Date of Exam: 05/18/2024 Medical Rec #:  [id number]       Height:       68.0 in Accession #:    [id number]      Weight:       238.1 lb Date of Birth:  05/11/1946       BSA:          2.201 m Patient Age:    77 years        BP:           99/60 mmHg Patient Gender: M               HR:           48 bpm. Exam Location:  Inpatient Procedure: 2D Echo, Color Doppler, Cardiac Doppler and Intracardiac            Opacification Agent (Both Spectral and Color Flow Doppler were            utilized during procedure). Indications:    Shock R57.9  History:        Patient has prior history of Echocardiogram examinations, most                 recent 05/28/2021.  Sonographer:    Hersey Lorenzo  RDCS Referring Phys: [phone] Ky Phillips HOFFMAN IMPRESSIONS  1. Left ventricular ejection fraction, by estimation, is <20%. Left ventricular ejection fraction by 2D MOD biplane is 18.6 %. The left ventricle has severely decreased function. The left ventricle demonstrates regional wall motion abnormalities (see scoring diagram/findings for description). The left ventricular internal cavity size was mildly dilated. Left ventricular diastolic parameters are consistent with Grade II diastolic dysfunction (pseudonormalization). Elevated left ventricular end-diastolic pressure.  2. Right ventricular systolic function is severely reduced. The right ventricular size is normal. There is normal pulmonary artery systolic pressure.  3. Right atrial size was severely dilated.  4. The mitral valve is normal in structure. No evidence of mitral valve regurgitation. No evidence of mitral stenosis.  5. The aortic valve is tricuspid. Aortic valve regurgitation is not visualized. No aortic stenosis is present.  6. The inferior vena cava is normal in size with <50% respiratory variability, suggesting right atrial pressure of 8 mmHg. FINDINGS  Left Ventricle: Left ventricular ejection fraction, by estimation, is <20%. Left ventricular ejection fraction by 2D MOD biplane is 18.6 %. The left ventricle has severely decreased function. The left ventricle demonstrates regional wall motion abnormalities. Definity contrast agent was given IV to delineate the left ventricular endocardial borders. The left ventricular internal cavity size was mildly dilated. There is no left ventricular hypertrophy. Left ventricular diastolic parameters are consistent with Grade II diastolic dysfunction (pseudonormalization). Elevated left ventricular end-diastolic pressure.  LV Wall Scoring: The entire septum and inferior wall are akinetic. The entire anterior wall, entire lateral wall, apical inferior segment, and apex are hypokinetic. Right Ventricle: The right  ventricular size is normal. No increase in right ventricular wall thickness. Right ventricular systolic function is severely reduced. There is normal pulmonary artery systolic pressure. The tricuspid regurgitant velocity is 2.02 m/s, and with an assumed right atrial pressure of 8 mmHg, the estimated right ventricular systolic pressure is 24.3 mmHg. Left Atrium: Left atrial size was normal in size. Right Atrium: Right atrial  size was severely dilated. Pericardium: There is no evidence of pericardial effusion. Mitral Valve: The mitral valve is normal in structure. No evidence of mitral valve regurgitation. No evidence of mitral valve stenosis. Tricuspid Valve: The tricuspid valve is normal in structure. Tricuspid valve regurgitation is mild . No evidence of tricuspid stenosis. Aortic Valve: The aortic valve is tricuspid. Aortic valve regurgitation is not visualized. No aortic stenosis is present. Pulmonic Valve: The pulmonic valve was normal in structure. Pulmonic valve regurgitation is not visualized. No evidence of pulmonic stenosis. Aorta: The aortic root is normal in size and structure. Venous: The inferior vena cava is normal in size with less than 50% respiratory variability, suggesting right atrial pressure of 8 mmHg. IAS/Shunts: No atrial level shunt detected by color flow Doppler.  LEFT VENTRICLE PLAX 2D                        Biplane EF (MOD) LVIDd:         6.10 cm         LV Biplane EF:   Left LVIDs:         5.80 cm                          ventricular LV PW:         0.90 cm                          ejection LV IVS:        0.90 cm                          fraction by LVOT diam:     2.20 cm                          2D MOD LV SV:         52                               biplane is LV SV Index:   23                               18.6 %. LVOT Area:     3.80 cm                                Diastology                                LV e' lateral:   3.88 cm/s LV Volumes (MOD)               LV E/e' lateral: 16.1  LV vol d, MOD    133.0 ml A2C: LV vol d, MOD    172.0 ml A4C: LV vol s, MOD    107.0 ml A2C: LV vol s, MOD    145.0 ml A4C: LV SV MOD A2C:   26.0 ml LV SV MOD A4C:   172.0 ml LV SV MOD BP:    29.9 ml RIGHT VENTRICLE            IVC RV S prime:  4.73 cm/s  IVC diam: 1.40 cm TAPSE (M-mode): 0.6 cm LEFT ATRIUM             Index LA diam:        4.40 cm 2.00 cm/m LA Vol (A2C):   57.8 ml 26.26 ml/m LA Vol (A4C):   92.8 ml 42.16 ml/m LA Biplane Vol: 73.8 ml 33.53 ml/m  AORTIC VALVE LVOT Vmax:   63.10 cm/s LVOT Vmean:  41.700 cm/s LVOT VTI:    0.136 m  AORTA Ao Root diam: 3.10 cm Ao Asc diam:  3.20 cm MITRAL VALVE               TRICUSPID VALVE MV Area (PHT): 4.08 cm    TR Peak grad:   16.3 mmHg MV Decel Time: 186 msec    TR Vmax:        202.00 cm/s MV E velocity: 62.40 cm/s MV A velocity: 59.70 cm/s  SHUNTS MV E/A ratio:  1.05        Systemic VTI:  0.14 m                            Systemic Diam: 2.20 cm Maudine Sos MD Electronically signed by Maudine Sos MD Signature Date/Time: 05/18/2024/11:47:03 AM    Final    CT Head Wo Contrast Result Date: 05/18/2024 CLINICAL DATA:  Mental status change, unknown cause EXAM: CT HEAD WITHOUT CONTRAST TECHNIQUE: Contiguous axial images were obtained from the base of the skull through the vertex without intravenous contrast. RADIATION DOSE REDUCTION: This exam was performed according to the departmental dose-optimization program which includes automated exposure control, adjustment of the mA and/or kV according to patient size and/or use of iterative reconstruction technique. COMPARISON:  None Available. FINDINGS: Brain: Normal anatomic configuration. Parenchymal volume loss is commensurate with the patient's age. No abnormal intra or extra-axial mass lesion or fluid collection. No abnormal mass effect or midline shift. No evidence of acute intracranial hemorrhage or infarct. Ventricular size is normal. Cerebellum unremarkable. Vascular: No asymmetric hyperdense  vasculature at the skull base. Skull: Intact Sinuses/Orbits: Opacification of the nasopharynx and in sinus mild mucosal thickening is nonspecific in the setting of intubation. Orbits are unremarkable. Other: Mastoid air cells and middle ear cavities are clear. IMPRESSION: 1. No acute intracranial abnormality. Electronically Signed   By: Worthy Heads M.D.   On: 05/18/2024 05:14   DG Abd 1 View Result Date: 05/18/2024 CLINICAL DATA:  Check gastric catheter placement EXAM: ABDOMEN - 1 VIEW COMPARISON:  None Available. FINDINGS: Scattered large and small bowel gas is noted. Gastric catheter extends into the stomach. No free air is seen. IMPRESSION: Gastric catheter within the stomach. Electronically Signed   By: Violeta Grey M.D.   On: 05/18/2024 02:56   DG CHEST PORT 1 VIEW Result Date: 05/18/2024 CLINICAL DATA:  Check central line placement EXAM: PORTABLE CHEST 1 VIEW COMPARISON:  Film from the previous day. FINDINGS: Jugular central line is noted at the cavoatrial junction. Endotracheal tube is noted in satisfactory position. Heart is mildly enlarged. Postsurgical changes are again seen. Central vascular congestion is noted as well. IMPRESSION: No pneumothorax following central line placement. Changes of CHF. Electronically Signed   By: Violeta Grey M.D.   On: 05/18/2024 02:10   DG Chest Portable 1 View Result Date: 05/17/2024 CLINICAL DATA:  Respiratory distress EXAM: PORTABLE CHEST 1 VIEW COMPARISON:  10/08/2023 FINDINGS: Cardiac shadow is enlarged. Postsurgical changes are noted. The overall inspiratory  effort is poor. Central vascular congestion is noted with mild changes of edema. IMPRESSION: Mild changes of CHF. Electronically Signed   By: Violeta Grey M.D.   On: 05/17/2024 22:51    Microbiology: Results for orders placed or performed during the hospital encounter of 05/17/24  Blood Culture (routine x 2)     Status: None   Collection Time: 05/17/24 10:59 PM   Specimen: BLOOD LEFT ARM  Result  Value Ref Range Status   Specimen Description BLOOD LEFT ARM  Final   Special Requests   Final    BOTTLES DRAWN AEROBIC AND ANAEROBIC Blood Culture results may not be optimal due to an inadequate volume of blood received in culture bottles   Culture   Final    NO GROWTH 5 DAYS Performed at [facility], [address], [place], [place] [address]    Report Status 05/22/2024 FINAL  Final  Blood Culture (routine x 2)     Status: None   Collection Time: 05/18/24 12:19 AM   Specimen: BLOOD LEFT HAND  Result Value Ref Range Status   Specimen Description BLOOD LEFT HAND  Final   Special Requests   Final    BOTTLES DRAWN AEROBIC AND ANAEROBIC Blood Culture results may not be optimal due to an inadequate volume of blood received in culture bottles   Culture   Final    NO GROWTH 5 DAYS Performed at [facility], [address] [place], [place] [address]    Report Status 05/23/2024 FINAL  Final  MRSA Next Gen by PCR, Nasal     Status: None   Collection Time: 05/18/24  1:32 AM   Specimen: Nasal Mucosa; Nasal Swab  Result Value Ref Range Status   MRSA by PCR Next Gen NOT DETECTED NOT DETECTED Final    Comment: (NOTE) The GeneXpert MRSA Assay (FDA approved for NASAL specimens only), is one component of a comprehensive MRSA colonization surveillance program. It is not intended to diagnose MRSA infection nor to guide or monitor treatment for MRSA infections. Test performance is not FDA approved in patients less than 74 years old. Performed at [facility], [address], [place], [place] [address]     Labs: CBC: Recent Labs  Lab 05/21/24 [phone] 05/22/24 0229 05/23/24 0434 05/25/24 0359  WBC 12.8* 11.2* 9.2 6.7  HGB 11.2* 11.0* 11.1* 10.5*  HCT 35.6* 34.8* 35.1* 32.9*  MCV 98.9 97.2 99.2 96.5  PLT 152 146* 150 110*   Basic Metabolic Panel: Recent Labs  Lab 05/18/24 1649 05/19/24 0515 05/19/24 1834 05/20/24 0408 05/21/24 0444 05/22/24 0229 05/22/24 0231  05/23/24 0443 05/25/24 0359  NA  --  143  --    < > 148* 145 144 142 143  K  --  4.8  --    < > 3.7 4.1 4.1 3.6 3.8  CL  --  109  --    < > 109 110 110 110 108  CO2  --  21*  --    < > 29 26 26 27 25   GLUCOSE  --  214*  --    < > 73 171* 168* 151* 199*  BUN  --  44*  --    < > 51* 47* 47* 37* 27*  CREATININE  --  2.63*  --    < > 2.24* 2.10* 2.07* 1.86* 1.48*  CALCIUM   --  8.5*  --    < > 8.8* 8.5* 8.4* 8.2* 8.6*  MG 2.2 2.2 2.2  --   --   --   --   --   --   PHOS 4.2 4.8* 4.3  --   --   --  3.3 2.3* 1.8*   < > = values in this interval not displayed.   Liver Function Tests: Recent Labs  Lab 05/22/24 0231 05/23/24 0443 05/25/24 0359  ALBUMIN 3.0* 3.0* 2.9*   CBG: Recent Labs  Lab 05/24/24 0819 05/24/24 1226 05/24/24 1636 05/24/24 2131 05/25/24 0734  GLUCAP 204* 169* 206* 265* 164*    Discharge time spent: approximately 45 minutes spent on discharge counseling, evaluation of patient on day of discharge, and coordination of discharge planning with nursing, social work, pharmacy and case management  Signed: Ephriam Hashimoto, MD [facility] 05/25/2024

## 2024-05-25 NOTE — TOC Transition Note (Signed)
 Transition of Care [facility]) - Discharge Note   Patient Details  Name: Paul [name] MRN: [id number] Date of Birth: February 28, 1946  Transition [facility]) CM/SW Contact:  Carmon Christen, LCSWA Phone Number: 05/25/2024, 11:51 AM   Clinical Narrative:     Patient will DC to: [facility]   Anticipated DC date: 05/25/2024  Family notified: [name]  Transport by: Lyna Sandhoff  ?  Per MD patient ready for DC to [facility] . RN, patient, patient's family, and facility notified of DC. Discharge Summary sent to facility. RN given number for report [phone] RM# [id number]. DC packet on chart. Ambulance transport requested for patient.  CSW signing off.   Final next level of care: Skilled Nursing Facility Barriers to Discharge: No Barriers Identified   Patient Goals and CMS Choice Patient states their goals for this hospitalization and ongoing recovery are:: SNF   Choice offered to / list presented to : Patient, Adult Children (patient and daughter)      Discharge Placement              Patient chooses bed at: [facility] Patient to be transferred to facility by: [facility] Name of family member notified: [name] Patient and family notified of of transfer: 05/25/24  Discharge Plan and Services Additional resources added to the After Visit Summary for                                       Social Drivers of Health (SDOH) Interventions SDOH Screenings   Food Insecurity: Patient Unable To Answer (05/18/2024)  Housing: Unknown (05/24/2024)  Transportation Needs: No Transportation Needs (05/24/2024)  Utilities: Patient Unable To Answer (05/18/2024)  Alcohol Screen: Low Risk  (05/24/2024)  Financial Resource Strain: Low Risk  (05/24/2024)  Social Connections: Patient Unable To Answer (05/18/2024)  Stress: No Stress Concern Present (02/03/2023)   Received from [facility]  Tobacco Use: High Risk (05/24/2024)     Readmission Risk Interventions    04/08/2023    4:58 PM   Readmission Risk Prevention Plan  Transportation Screening Complete  PCP or Specialist Appt within 5-7 Days Complete  Home Care Screening Complete  Medication Review (RN CM) Complete

## 2024-05-25 NOTE — Progress Notes (Signed)
   05/25/24 1144  Spiritual Encounters  Type of Visit Initial  Care provided to: Pt and family;Patient  Conversation partners present during encounter Nurse  Referral source Nurse (RN/NT/LPN)  Reason for visit Advance directives  OnCall Visit No  Spiritual Framework  Presenting Themes Courage hope and growth;Caregiving needs  Values/beliefs Prayer  Community/Connection Friend(s);Family  Strengths Faith, Hope, Determination  Needs/Challenges/Barriers Family suport  Patient Stress Factors Health changes;Major life changes  Family Stress Factors Exhausted;Health changes  Goals  Self/Personal Goals Recovery  Interventions  Spiritual Care Interventions Made Prayer;Established relationship of care and support;Compassionate presence  Intervention Outcomes  Outcomes Patient family open to resources;Awareness of health;Awareness around self/spiritual resourses;Reduced anxiety  Spiritual Care Plan  Spiritual Care Issues Still Outstanding No further spiritual care needs at this time (see row info)  Follow up plan  PT will be discharged today 05/25/24  Advance Directives (For Healthcare)  Does Patient Have a Medical Advance Directive? Yes  Type of Advance Directive Healthcare Power of Attorney  Copy of Healthcare Power of Attorney in Chart? Yes - validated most recent copy scanned in chart (See row information)   PT and daughter demonstrated a clear understanding of the importance of AD. The presence of family presence brought emotional comfort and strengthened the PT's confidence in making decision.  Spiritual need centered around clarity, peace of mind and family unit in planning. AD was completed and prayer and scripture was offered.  PT and daughter expressed deep gratitude for the visit. No follow-up requested at time, PT will be discharged today 05/25/24  2 Timothy 4:7 "I have fought the good fight, I have finished the race, I have kept the faith".

## 2024-05-26 NOTE — Telephone Encounter (Signed)
 I think he is supposed to have a repeat PET through the [place]? We should try to see him after with those images on disk. If no PET is set up, then we should get a CT chest without contrast and see him asap after

## 2024-05-27 NOTE — Telephone Encounter (Signed)
 Spoke with Paul [name]. Paul [name] is at [facility] and is expected to be there for 2 more weeks. Paul [name] is unsure if he is need of a repeat PET. I contacted the [place] and they do not have any orders for Paul [name].  Placing order for CT, Paul [name] will not be able to complete this until out of rehab.   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  Please advise [facility] CT needs to be scheduled once Paul [name] gets out of rehab (Paul [name] thinks he will be there for 2 more weeks). Please route this back to me once this gets scheduled so I can schedule Paul [name] for office visit to review.

## 2024-05-28 ENCOUNTER — Other Ambulatory Visit: Payer: Self-pay

## 2024-05-30 NOTE — Telephone Encounter (Signed)
 Pt has been scheduled & letter has been sent. Nothing further needed.

## 2024-05-31 ENCOUNTER — Other Ambulatory Visit: Payer: Self-pay

## 2024-06-17 ENCOUNTER — Ambulatory Visit (HOSPITAL_BASED_OUTPATIENT_CLINIC_OR_DEPARTMENT_OTHER)

## 2024-06-20 ENCOUNTER — Telehealth: Payer: Self-pay | Admitting: Internal Medicine

## 2024-06-20 NOTE — Telephone Encounter (Signed)
 Called the patient to reschedule new pt appointment per provider on call and 6/18 staff message. Left a voicemail with the rescheduled appointment details with Madelin Calin.

## 2024-06-23 ENCOUNTER — Other Ambulatory Visit: Payer: Self-pay | Admitting: Medical Oncology

## 2024-06-23 DIAGNOSIS — C3490 Malignant neoplasm of unspecified part of unspecified bronchus or lung: Secondary | ICD-10-CM

## 2024-06-27 ENCOUNTER — Inpatient Hospital Stay: Payer: Self-pay | Attending: Internal Medicine

## 2024-06-27 ENCOUNTER — Inpatient Hospital Stay (HOSPITAL_BASED_OUTPATIENT_CLINIC_OR_DEPARTMENT_OTHER): Admitting: Internal Medicine

## 2024-06-27 VITALS — BP 116/71 | HR 83 | Temp 98.0°F | Resp 19 | Ht 68.0 in | Wt 235.5 lb

## 2024-06-27 DIAGNOSIS — C3490 Malignant neoplasm of unspecified part of unspecified bronchus or lung: Secondary | ICD-10-CM

## 2024-06-27 DIAGNOSIS — Z9981 Dependence on supplemental oxygen: Secondary | ICD-10-CM | POA: Diagnosis not present

## 2024-06-27 DIAGNOSIS — C3431 Malignant neoplasm of lower lobe, right bronchus or lung: Secondary | ICD-10-CM | POA: Diagnosis not present

## 2024-06-27 DIAGNOSIS — Z9221 Personal history of antineoplastic chemotherapy: Secondary | ICD-10-CM | POA: Diagnosis not present

## 2024-06-27 DIAGNOSIS — Z923 Personal history of irradiation: Secondary | ICD-10-CM | POA: Diagnosis not present

## 2024-06-27 DIAGNOSIS — C3491 Malignant neoplasm of unspecified part of right bronchus or lung: Secondary | ICD-10-CM

## 2024-06-27 DIAGNOSIS — Z932 Ileostomy status: Secondary | ICD-10-CM | POA: Insufficient documentation

## 2024-06-27 LAB — CBC WITH DIFFERENTIAL (CANCER CENTER ONLY)
Abs Immature Granulocytes: 0.02 10*3/uL (ref 0.00–0.07)
Basophils Absolute: 0 10*3/uL (ref 0.0–0.1)
Basophils Relative: 1 %
Eosinophils Absolute: 0.2 10*3/uL (ref 0.0–0.5)
Eosinophils Relative: 4 %
HCT: 30.6 % — ABNORMAL LOW (ref 39.0–52.0)
Hemoglobin: 9.6 g/dL — ABNORMAL LOW (ref 13.0–17.0)
Immature Granulocytes: 0 %
Lymphocytes Relative: 15 %
Lymphs Abs: 1 10*3/uL (ref 0.7–4.0)
MCH: 30.9 pg (ref 26.0–34.0)
MCHC: 31.4 g/dL (ref 30.0–36.0)
MCV: 98.4 fL (ref 80.0–100.0)
Monocytes Absolute: 0.7 10*3/uL (ref 0.1–1.0)
Monocytes Relative: 10 %
Neutro Abs: 4.4 10*3/uL (ref 1.7–7.7)
Neutrophils Relative %: 70 %
Platelet Count: 228 10*3/uL (ref 150–400)
RBC: 3.11 MIL/uL — ABNORMAL LOW (ref 4.22–5.81)
RDW: 17.2 % — ABNORMAL HIGH (ref 11.5–15.5)
WBC Count: 6.3 10*3/uL (ref 4.0–10.5)
nRBC: 0 % (ref 0.0–0.2)

## 2024-06-27 LAB — CMP (CANCER CENTER ONLY)
ALT: 9 U/L (ref 0–44)
AST: 13 U/L — ABNORMAL LOW (ref 15–41)
Albumin: 4.1 g/dL (ref 3.5–5.0)
Alkaline Phosphatase: 104 U/L (ref 38–126)
Anion gap: 9 (ref 5–15)
BUN: 18 mg/dL (ref 8–23)
CO2: 32 mmol/L (ref 22–32)
Calcium: 9.5 mg/dL (ref 8.9–10.3)
Chloride: 103 mmol/L (ref 98–111)
Creatinine: 1.66 mg/dL — ABNORMAL HIGH (ref 0.61–1.24)
GFR, Estimated: 41 mL/min — ABNORMAL LOW (ref >60)
Glucose, Bld: 55 mg/dL — ABNORMAL LOW (ref 70–99)
Potassium: 3.7 mmol/L (ref 3.5–5.1)
Sodium: 144 mmol/L (ref 135–145)
Total Bilirubin: 0.7 mg/dL (ref 0.0–1.2)
Total Protein: 7.6 g/dL (ref 6.5–8.1)

## 2024-06-27 NOTE — Progress Notes (Signed)
 [facility] [phone]   [phone]  OFFICE PROGRESS NOTE  Clinic, [facility] [address] [facility] [place] [address]  DIAGNOSIS:  Stage IIIB (T3b, N2, M0) non-small cell lung cancer, adenocarcinoma presented with large right lower lobe lung mass and suspicious right lower paratracheal lymphadenopathy diagnosed in June 2022.   Molecular studies by [employer] showed no actionable mutations   PRIOR THERAPY: 1) Neoadjuvant systemic chemotherapy according to the [employer] 816 with carboplatin  for AUC of 5, Alimta  500 Mg/M2 and nivolumab  360 Mg IV every 3 weeks for 3 cycles.  Last dose 09/24/21.  Status post 3 cycles 2) Concurrent chemoradiation with carboplatin  for an AUC of 2 and paclitaxel  45 mg per metered square.  First dose expected on 11/04/2021.  Status post 7 cycles. 3) Consolidation treatment with immunotherapy with Imfinzi  1500 Mg IV every 4 weeks.  Last dose on 03/12/23.  Status post 13 cycles.    CURRENT THERAPY: Observation   INTERVAL HISTORY: Paul [name] 78 y.o. male returns to the clinic today for follow-up visit.  The patient was lost to follow-up since August 2024.  He was planning to move to [place] at that time but it did not happen.Discussed the use of AI scribe software for clinical note transcription with the patient, who gave verbal consent to proceed.  History of Present Illness   Paul [name] is an 78 year old male with stage III B adenocarcinoma who presents for follow-up after completing treatment.  He was diagnosed with stage III B adenocarcinoma in June 2022 and underwent neoadjuvant systemic chemotherapy with carboplatin , Alimta , and nivolumab  for three cycles. He was not a surgical candidate and subsequently received concurrent chemoradiation with weekly carboplatin  and paclitaxel , followed by one year of consolidation treatment with durvalumab , completed on March 12, 2023. Since then, he  has been under observation.  A recent PET scan in January 2025 indicated tumor growth in the lung and mediastinal lymph nodes. He has not received any treatment since the PET scan. No recent breathing issues or other symptoms related to his cancer.  He experienced a fall that resulted in hospitalization and subsequent rehabilitation. He is currently using supplemental oxygen at two liters per minute. He plans to move to [place] to be closer to his daughter and has established care at the [place] [facility] there.       MEDICAL HISTORY: Past Medical History:  Diagnosis Date   CHF (congestive heart failure) (HCC)    Coronary artery disease    Diabetes mellitus without complication (HCC)    History of radiation therapy    Right lung- 11/27/21-01/10/22- Dr. Lynwood Nasuti   Hypertension    Hypothyroidism    Ischemic cardiomyopathy    Pneumonia    a long time ago   Sleep apnea    no longer uses a cpap    ALLERGIES:  has no known allergies.  MEDICATIONS:  Current Outpatient Medications  Medication Sig Dispense Refill   albuterol  (PROVENTIL ) (2.5 MG/3ML) 0.083% nebulizer solution Inhale 3 mLs into the lungs 3 (three) times daily as needed for shortness of breath.     albuterol  (VENTOLIN  HFA) 108 (90 Base) MCG/ACT inhaler Inhale 2 puffs into the lungs every 6 (six) hours as needed for wheezing or shortness of breath. 8 g 2   ammonium lactate (LAC-HYDRIN) 12 % lotion Apply 1 Application topically daily. Apply as directed daily to affected area on back after shower.     aspirin  81 MG  chewable tablet Take 81 mg by mouth daily.     atorvastatin  (LIPITOR) 80 MG tablet Take 80 mg by mouth at bedtime.     carvedilol  (COREG ) 6.25 MG tablet Take 6.25 mg by mouth 2 (two) times daily with a meal.     clobetasol cream (TEMOVATE) 0.05 % Apply 1 Application topically 2 (two) times daily as needed (back pain).     empagliflozin  (JARDIANCE ) 25 MG TABS tablet Take 0.5 tablets by mouth every morning.      fluticasone  (FLONASE ) 50 MCG/ACT nasal spray Place 2 sprays into both nostrils daily. (Patient taking differently: Place 1 spray into both nostrils 2 (two) times daily.)  2   insulin  glargine (LANTUS ) 100 UNIT/ML Solostar Pen Inject 20 Units into the skin daily.     levothyroxine  (SYNTHROID ) 125 MCG tablet Take 1 tablet (125 mcg total) by mouth daily before breakfast. (Patient not taking: Reported on 05/18/2024) 30 tablet 2   metFORMIN  (GLUCOPHAGE -XR) 500 MG 24 hr tablet Take 1,000 mg by mouth 2 (two) times daily with a meal.     pantoprazole  (PROTONIX ) 40 MG tablet Take 1 tablet (40 mg total) by mouth daily. 30 tablet 1   Polyethyl Glyc-Propyl Glyc PF (LUBRICANT EYE DROPS, PF,) 0.4-0.3 % SOLN Place 1-2 drops into both eyes as needed (dry,red, irritated eyes).     [Paused] sacubitril -valsartan  (ENTRESTO ) 49-51 MG Take 1 tablet by mouth 2 (two) times daily.     urea (CARMOL) 10 % cream Apply 1 Application topically daily. Apply as directed to affected area on back daily after showers.     No current facility-administered medications for this visit.    SURGICAL HISTORY:  Past Surgical History:  Procedure Laterality Date   BRONCHIAL BIOPSY  06/04/2021   Procedure: BRONCHIAL BIOPSIES;  Surgeon: Brenna Adine CROME, DO;  Location: [facility];  Service: Pulmonary;;   BRONCHIAL BRUSHINGS  06/04/2021   Procedure: BRONCHIAL BRUSHINGS;  Surgeon: Brenna Adine CROME, DO;  Location: [facility];  Service: Pulmonary;;   BRONCHIAL NEEDLE ASPIRATION BIOPSY  06/04/2021   Procedure: BRONCHIAL NEEDLE ASPIRATION BIOPSIES;  Surgeon: Brenna Adine CROME, DO;  Location: [facility];  Service: Pulmonary;;   BRONCHIAL WASHINGS  06/04/2021   Procedure: BRONCHIAL WASHINGS;  Surgeon: Brenna Adine CROME, DO;  Location: [facility];  Service: Pulmonary;;   CARDIAC SURGERY     COLONOSCOPY     CORONARY ARTERY BYPASS GRAFT     2019 at [facility]   VIDEO BRONCHOSCOPY WITH ENDOBRONCHIAL NAVIGATION Right 06/04/2021   Procedure: VIDEO  BRONCHOSCOPY WITH ENDOBRONCHIAL NAVIGATION;  Surgeon: Brenna Adine CROME, DO;  Location: [facility];  Service: Pulmonary;  Laterality: Right;   VIDEO BRONCHOSCOPY WITH ENDOBRONCHIAL ULTRASOUND  06/04/2021   Procedure: VIDEO BRONCHOSCOPY WITH ENDOBRONCHIAL ULTRASOUND;  Surgeon: Brenna Adine CROME, DO;  Location: [facility];  Service: Pulmonary;;    REVIEW OF SYSTEMS:  Constitutional: positive for fatigue Eyes: negative Ears, nose, mouth, throat, and face: negative Respiratory: positive for dyspnea on exertion Cardiovascular: negative Gastrointestinal: negative Genitourinary:negative Integument/breast: negative Hematologic/lymphatic: negative Musculoskeletal:positive for arthralgias and muscle weakness Neurological: negative Behavioral/Psych: negative Endocrine: negative Allergic/Immunologic: negative   PHYSICAL EXAMINATION: General appearance: alert, cooperative, fatigued, and no distress Head: Normocephalic, without obvious abnormality, atraumatic Neck: no adenopathy, no JVD, supple, symmetrical, trachea midline, and thyroid  not enlarged, symmetric, no tenderness/mass/nodules Lymph nodes: Cervical, supraclavicular, and axillary nodes normal. Resp: clear to auscultation bilaterally Back: symmetric, no curvature. ROM normal. No CVA tenderness. Cardio: regular rate and rhythm, S1, S2 normal, no murmur, click, rub  or gallop GI: soft, non-tender; bowel sounds normal; no masses,  no organomegaly Extremities: extremities normal, atraumatic, no cyanosis or edema Neurologic: Alert and oriented X 3, normal strength and tone. Normal symmetric reflexes. Normal coordination and gait  ECOG PERFORMANCE STATUS: 1 - Symptomatic but completely ambulatory  Blood pressure 116/71, pulse 83, temperature 98 F (36.7 C), temperature source Temporal, resp. rate 19, height 5' 8 (1.727 m), weight 235 lb 8 oz (106.8 kg), SpO2 (!) 82%.  LABORATORY DATA: Lab Results  Component Value Date   WBC 6.3 06/27/2024    HGB 9.6 (L) 06/27/2024   HCT 30.6 (L) 06/27/2024   MCV 98.4 06/27/2024   PLT 228 06/27/2024      Chemistry      Component Value Date/Time   NA 143 05/25/2024 0359   K 3.8 05/25/2024 0359   CL 108 05/25/2024 0359   CO2 25 05/25/2024 0359   BUN 27 (H) 05/25/2024 0359   CREATININE 1.48 (H) 05/25/2024 0359   CREATININE 1.54 (H) 07/14/2023 1234      Component Value Date/Time   CALCIUM  8.6 (L) 05/25/2024 0359   ALKPHOS 71 05/17/2024 2309   AST 47 (H) 05/17/2024 2309   AST 13 (L) 07/14/2023 1234   ALT 27 05/17/2024 2309   ALT 7 07/14/2023 1234   BILITOT 0.8 05/17/2024 2309   BILITOT 0.4 07/14/2023 1234       RADIOGRAPHIC STUDIES: No results found.  ASSESSMENT AND PLAN: This is a very pleasant 78 years old African-American male with stage IIIb non-small cell lung cancer, adenocarcinoma diagnosed in June 2022 with no actionable mutations status post neoadjuvant systemic chemotherapy with carboplatin , Alimta  and nivolumab  for 3 cycles but he has no significant improvement of his disease. He proceeded with a course of concurrent chemoradiation with weekly carboplatin  and paclitaxel  for 7 cycles with partial response. This was followed by consolidation treatment with immunotherapy with Imfinzi  every 4 weeks status post 13 cycles. The patient has been tolerating his treatment well except for fatigue.  The patient is currently on observation and he is feeling fine except for the baseline shortness of breath and swelling of the lower extremities. The patient was lost to follow-up for almost a year. Assessment and Plan    Stage 3B adenocarcinoma of the lung Stage 3B adenocarcinoma of the lung with progression on PET scan in January 2025, showing tumor growth in the lung and mediastinal lymph nodes. No treatment initiated since progression identified. Planning to move to [place] and establish care at the [facility]. - [facility] [place] [facility]  [facility]. - [facility]ise [facility] to [facility] a PET scan or CT scan of the chest, abdomen, and pelvis to assess current cancer status. - Discuss potential need for additional chemotherapy or immunotherapy based on scan results.  Oxygen dependence Oxygen dependence managed with 2 liters of supplemental oxygen. No recent exacerbations or hospitalizations due to respiratory issues.   He was advised to call immediately if he has any other concerning symptoms in the interval. The patient voices understanding of current disease status and treatment options and is in agreement with the current care plan.  All questions were answered. The patient knows to call the clinic with any problems, questions or concerns. We can certainly see the patient much sooner if necessary.  The total time spent in the appointment was 30 minutes.  Disclaimer: This note was dictated with voice recognition software. Similar sounding words can inadvertently be transcribed and may not  be corrected upon review.

## 2024-07-20 ENCOUNTER — Telehealth: Payer: Self-pay

## 2024-07-20 NOTE — Telephone Encounter (Signed)
 Spoke with patient's daughter regarding transition to hospice care in [place]. Daughter provided contact information for the [facility] social worker, including phone and fax numbers. Called [facility] office and spoke with Shona, the [facility] social worker, who stated they need the patient's last office note. Faxed the requested office note to [phone].

## 2024-07-27 ENCOUNTER — Ambulatory Visit: Admitting: Emergency Medicine

## 2024-07-27 ENCOUNTER — Encounter: Payer: Self-pay | Admitting: Emergency Medicine

## 2024-07-27 DIAGNOSIS — C3491 Malignant neoplasm of unspecified part of right bronchus or lung: Secondary | ICD-10-CM

## 2024-08-03 ENCOUNTER — Other Ambulatory Visit: Payer: Self-pay

## 2024-08-23 ENCOUNTER — Other Ambulatory Visit: Payer: Self-pay
